# Patient Record
Sex: Male | Born: 1960 | Race: White | Hispanic: No | Marital: Married | Smoking: Current every day smoker
Health system: Southern US, Community
[De-identification: ages and names within clinical notes are randomized; demographics above are authoritative.]

## PROBLEM LIST (undated history)

## (undated) DIAGNOSIS — K859 Acute pancreatitis without necrosis or infection, unspecified: Secondary | ICD-10-CM

## (undated) DIAGNOSIS — I10 Essential (primary) hypertension: Secondary | ICD-10-CM

## (undated) DIAGNOSIS — I714 Abdominal aortic aneurysm, without rupture, unspecified: Secondary | ICD-10-CM

## (undated) DIAGNOSIS — J449 Chronic obstructive pulmonary disease, unspecified: Secondary | ICD-10-CM

## (undated) DIAGNOSIS — Z5111 Encounter for antineoplastic chemotherapy: Secondary | ICD-10-CM

## (undated) DIAGNOSIS — E785 Hyperlipidemia, unspecified: Secondary | ICD-10-CM

## (undated) DIAGNOSIS — I2510 Atherosclerotic heart disease of native coronary artery without angina pectoris: Secondary | ICD-10-CM

## (undated) DIAGNOSIS — M1990 Unspecified osteoarthritis, unspecified site: Secondary | ICD-10-CM

## (undated) DIAGNOSIS — C3490 Malignant neoplasm of unspecified part of unspecified bronchus or lung: Secondary | ICD-10-CM

## (undated) DIAGNOSIS — I219 Acute myocardial infarction, unspecified: Secondary | ICD-10-CM

## (undated) HISTORY — DX: Essential (primary) hypertension: I10

## (undated) HISTORY — DX: Malignant neoplasm of unspecified part of unspecified bronchus or lung: C34.90

## (undated) HISTORY — DX: Hyperlipidemia, unspecified: E78.5

## (undated) HISTORY — DX: Atherosclerotic heart disease of native coronary artery without angina pectoris: I25.10

## (undated) HISTORY — PX: KNEE SURGERY: SHX244

## (undated) HISTORY — DX: Acute myocardial infarction, unspecified: I21.9

## (undated) HISTORY — PX: CORONARY ANGIOPLASTY: SHX604

## (undated) HISTORY — DX: Encounter for antineoplastic chemotherapy: Z51.11

---

## 1997-11-24 ENCOUNTER — Inpatient Hospital Stay (HOSPITAL_COMMUNITY): Admission: EM | Admit: 1997-11-24 | Discharge: 1997-11-26 | Payer: Self-pay | Admitting: Cardiology

## 2000-04-23 DIAGNOSIS — I2510 Atherosclerotic heart disease of native coronary artery without angina pectoris: Secondary | ICD-10-CM

## 2000-04-23 HISTORY — DX: Atherosclerotic heart disease of native coronary artery without angina pectoris: I25.10

## 2000-08-23 ENCOUNTER — Encounter (HOSPITAL_COMMUNITY): Admission: RE | Admit: 2000-08-23 | Discharge: 2000-09-22 | Payer: Self-pay | Admitting: Preventative Medicine

## 2000-12-09 ENCOUNTER — Inpatient Hospital Stay (HOSPITAL_COMMUNITY): Admission: EM | Admit: 2000-12-09 | Discharge: 2000-12-12 | Payer: Self-pay | Admitting: Cardiology

## 2002-05-08 ENCOUNTER — Encounter (HOSPITAL_COMMUNITY): Admission: RE | Admit: 2002-05-08 | Discharge: 2002-06-07 | Payer: Self-pay | Admitting: Cardiology

## 2004-05-01 ENCOUNTER — Emergency Department (HOSPITAL_COMMUNITY): Admission: EM | Admit: 2004-05-01 | Discharge: 2004-05-01 | Payer: Self-pay | Admitting: Emergency Medicine

## 2004-08-27 ENCOUNTER — Emergency Department (HOSPITAL_COMMUNITY): Admission: EM | Admit: 2004-08-27 | Discharge: 2004-08-27 | Payer: Self-pay | Admitting: Emergency Medicine

## 2004-08-29 ENCOUNTER — Emergency Department (HOSPITAL_COMMUNITY): Admission: EM | Admit: 2004-08-29 | Discharge: 2004-08-29 | Payer: Self-pay | Admitting: Emergency Medicine

## 2005-02-26 ENCOUNTER — Ambulatory Visit: Payer: Self-pay | Admitting: Cardiology

## 2006-01-05 ENCOUNTER — Emergency Department (HOSPITAL_COMMUNITY): Admission: EM | Admit: 2006-01-05 | Discharge: 2006-01-05 | Payer: Self-pay | Admitting: Emergency Medicine

## 2006-01-18 ENCOUNTER — Ambulatory Visit: Payer: Self-pay | Admitting: Internal Medicine

## 2006-02-15 ENCOUNTER — Ambulatory Visit (HOSPITAL_COMMUNITY): Admission: RE | Admit: 2006-02-15 | Discharge: 2006-02-15 | Payer: Self-pay | Admitting: Internal Medicine

## 2006-05-15 ENCOUNTER — Ambulatory Visit: Payer: Self-pay | Admitting: Cardiology

## 2006-05-31 ENCOUNTER — Ambulatory Visit: Payer: Self-pay | Admitting: Cardiology

## 2006-06-28 ENCOUNTER — Ambulatory Visit: Payer: Self-pay | Admitting: Cardiology

## 2006-08-14 ENCOUNTER — Ambulatory Visit: Payer: Self-pay | Admitting: Cardiology

## 2006-08-14 ENCOUNTER — Encounter (HOSPITAL_COMMUNITY): Admission: RE | Admit: 2006-08-14 | Discharge: 2006-09-13 | Payer: Self-pay | Admitting: Cardiology

## 2006-08-27 ENCOUNTER — Emergency Department (HOSPITAL_COMMUNITY): Admission: EM | Admit: 2006-08-27 | Discharge: 2006-08-27 | Payer: Self-pay | Admitting: Emergency Medicine

## 2008-09-13 ENCOUNTER — Encounter: Payer: Self-pay | Admitting: Cardiology

## 2008-10-10 ENCOUNTER — Emergency Department (HOSPITAL_COMMUNITY): Admission: EM | Admit: 2008-10-10 | Discharge: 2008-10-10 | Payer: Self-pay | Admitting: Emergency Medicine

## 2008-12-05 ENCOUNTER — Emergency Department (HOSPITAL_COMMUNITY): Admission: EM | Admit: 2008-12-05 | Discharge: 2008-12-05 | Payer: Self-pay | Admitting: Emergency Medicine

## 2008-12-30 ENCOUNTER — Telehealth: Payer: Self-pay | Admitting: Cardiology

## 2008-12-31 ENCOUNTER — Ambulatory Visit: Payer: Self-pay | Admitting: Cardiology

## 2008-12-31 DIAGNOSIS — R079 Chest pain, unspecified: Secondary | ICD-10-CM

## 2009-01-03 ENCOUNTER — Encounter: Payer: Self-pay | Admitting: Cardiology

## 2009-01-03 ENCOUNTER — Telehealth (INDEPENDENT_AMBULATORY_CARE_PROVIDER_SITE_OTHER): Payer: Self-pay | Admitting: *Deleted

## 2009-01-04 ENCOUNTER — Telehealth (INDEPENDENT_AMBULATORY_CARE_PROVIDER_SITE_OTHER): Payer: Self-pay | Admitting: *Deleted

## 2009-01-04 LAB — CONVERTED CEMR LAB
Basophils Absolute: 0 10*3/uL (ref 0.0–0.1)
CO2: 25 meq/L (ref 19–32)
Calcium: 9 mg/dL (ref 8.4–10.5)
Chloride: 104 meq/L (ref 96–112)
Creatinine, Ser: 0.9 mg/dL (ref 0.4–1.5)
Direct LDL: 61.1 mg/dL
Eosinophils Relative: 2.3 % (ref 0.0–5.0)
GFR calc non Af Amer: 95.99 mL/min (ref >60)
Glucose, Bld: 92 mg/dL (ref 70–99)
HCT: 42.8 % (ref 39.0–52.0)
HDL: 26.5 mg/dL — ABNORMAL LOW (ref >39.00)
Hemoglobin: 14.9 g/dL (ref 13.0–17.0)
Lymphocytes Relative: 31.3 % (ref 12.0–46.0)
Lymphs Abs: 2 10*3/uL (ref 0.7–4.0)
Monocytes Relative: 7.1 % (ref 3.0–12.0)
Platelets: 165 10*3/uL (ref 150.0–400.0)
TSH: 4.13 microintl units/mL (ref 0.35–5.50)
Total Bilirubin: 1 mg/dL (ref 0.3–1.2)
Total Protein: 7.2 g/dL (ref 6.0–8.3)
Triglycerides: 1028 mg/dL — ABNORMAL HIGH (ref 0.0–149.0)
VLDL: 205.6 mg/dL — ABNORMAL HIGH (ref 0.0–40.0)
WBC: 6.3 10*3/uL (ref 4.5–10.5)

## 2009-01-10 ENCOUNTER — Encounter (INDEPENDENT_AMBULATORY_CARE_PROVIDER_SITE_OTHER): Payer: Self-pay | Admitting: *Deleted

## 2009-01-10 ENCOUNTER — Ambulatory Visit: Payer: Self-pay | Admitting: Cardiology

## 2009-01-10 DIAGNOSIS — I10 Essential (primary) hypertension: Secondary | ICD-10-CM | POA: Insufficient documentation

## 2009-01-10 DIAGNOSIS — E783 Hyperchylomicronemia: Secondary | ICD-10-CM | POA: Insufficient documentation

## 2009-01-10 DIAGNOSIS — I252 Old myocardial infarction: Secondary | ICD-10-CM

## 2009-01-10 DIAGNOSIS — I2510 Atherosclerotic heart disease of native coronary artery without angina pectoris: Secondary | ICD-10-CM | POA: Insufficient documentation

## 2009-04-27 ENCOUNTER — Emergency Department (HOSPITAL_COMMUNITY): Admission: EM | Admit: 2009-04-27 | Discharge: 2009-04-27 | Payer: Self-pay | Admitting: Emergency Medicine

## 2010-04-14 ENCOUNTER — Encounter: Payer: Self-pay | Admitting: Internal Medicine

## 2010-04-14 ENCOUNTER — Ambulatory Visit: Payer: Self-pay | Admitting: Internal Medicine

## 2010-04-14 ENCOUNTER — Telehealth: Payer: Self-pay | Admitting: Internal Medicine

## 2010-04-14 DIAGNOSIS — R05 Cough: Secondary | ICD-10-CM

## 2010-04-14 DIAGNOSIS — R634 Abnormal weight loss: Secondary | ICD-10-CM

## 2010-04-14 LAB — CONVERTED CEMR LAB
ALT: 16 units/L (ref 0–53)
AST: 16 units/L (ref 0–37)
Albumin: 4.1 g/dL (ref 3.5–5.2)
Alkaline Phosphatase: 64 units/L (ref 39–117)
Basophils Absolute: 0.1 10*3/uL (ref 0.0–0.1)
Basophils Relative: 1.2 % (ref 0.0–3.0)
Bilirubin Urine: NEGATIVE
Calcium: 9.1 mg/dL (ref 8.4–10.5)
Eosinophils Relative: 1.7 % (ref 0.0–5.0)
GFR calc non Af Amer: 90.8 mL/min (ref >60.00)
Glucose, Bld: 77 mg/dL (ref 70–99)
HCT: 43.4 % (ref 39.0–52.0)
HDL: 26.7 mg/dL — ABNORMAL LOW (ref >39.00)
Hemoglobin: 14.9 g/dL (ref 13.0–17.0)
Lymphocytes Relative: 31.9 % (ref 12.0–46.0)
Lymphs Abs: 2.4 10*3/uL (ref 0.7–4.0)
Monocytes Relative: 6.6 % (ref 3.0–12.0)
Neutro Abs: 4.5 10*3/uL (ref 1.4–7.7)
Nitrite: NEGATIVE
PSA: 2.01 ng/mL (ref 0.10–4.00)
Potassium: 4.3 meq/L (ref 3.5–5.1)
RBC: 4.72 M/uL (ref 4.22–5.81)
RDW: 13.8 % (ref 11.5–14.6)
Sodium: 132 meq/L — ABNORMAL LOW (ref 135–145)
TSH: 2.35 microintl units/mL (ref 0.35–5.50)
Total CHOL/HDL Ratio: 8
Total Protein, Urine: NEGATIVE mg/dL
Triglycerides: 634 mg/dL — ABNORMAL HIGH (ref 0.0–149.0)
Urine Glucose: NEGATIVE mg/dL
VLDL: 126.8 mg/dL — ABNORMAL HIGH (ref 0.0–40.0)
WBC: 7.6 10*3/uL (ref 4.5–10.5)
pH: 5.5 (ref 5.0–8.0)

## 2010-04-17 ENCOUNTER — Encounter: Payer: Self-pay | Admitting: Internal Medicine

## 2010-05-08 ENCOUNTER — Ambulatory Visit: Admit: 2010-05-08 | Payer: Self-pay | Admitting: Cardiology

## 2010-05-25 NOTE — Progress Notes (Signed)
Summary: RESULTS  Phone Note Call from Patient   Summary of Call: Pt concerned about labs. Per Dr Posey Rea double simvastatin, stay away from alcohol & fatty foods. F/u w/Dr Yetta Barre in 2 wks. Pt informed  Initial call taken by: Lamar Sprinkles, CMA,  April 14, 2010 4:14 PM  Follow-up for Phone Call        start lipofen Follow-up by: Etta Grandchild MD,  April 17, 2010 6:21 PM  Additional Follow-up for Phone Call Additional follow up Details #1::        Pt informed  Additional Follow-up by: Lamar Sprinkles, CMA,  April 18, 2010 10:17 AM    New/Updated Medications: SIMVASTATIN 10 MG TABS (SIMVASTATIN) 2 daily LIPOFEN 150 MG CAPS (FENOFIBRATE) one by mouth once daily for triglycerides Prescriptions: LIPOFEN 150 MG CAPS (FENOFIBRATE) one by mouth once daily for triglycerides  #90 x 3   Entered by:   Lamar Sprinkles, CMA   Authorized by:   Etta Grandchild MD   Signed by:   Lamar Sprinkles, CMA on 04/18/2010   Method used:   Electronically to        [facility]* (retail)       [address]       [address] Shipping/mailing       [place], [place]  [address]       Ph: [phone]       Fax: [phone]   RxID:   [id number] SIMVASTATIN 10 MG TABS (SIMVASTATIN) 2 daily  #180 x 1   Entered by:   Lamar Sprinkles, CMA   Authorized by:   Etta Grandchild MD   Signed by:   Lamar Sprinkles, CMA on 04/18/2010   Method used:   Electronically to        [facility]* (retail)       [address]       [address] Shipping/mailing       [place], [place]  [address]       Ph: [phone]       Fax: [phone]   RxID:   [id number] LIPOFEN 150 MG CAPS (FENOFIBRATE) one by mouth once daily for triglycerides  #30 x 11   Entered and Authorized by:   Etta Grandchild MD   Signed by:   Etta Grandchild MD on 04/17/2010   Method used:   Historical   RxID:   [id number]

## 2010-05-25 NOTE — Letter (Signed)
Summary: Lipid Letter  [facility]  [address] [place], [place] [address]   Phone: [phone]  Fax: [phone]    04/17/2010  [name] [address] [place], [place]  [address]  Dear [name]:  We have carefully reviewed your last lipid profile from  and the results are noted below with a summary of recommendations for lipid management.    Cholesterol:       206     Goal: <200   HDL "good" Cholesterol:   40.10     Goal: >40   LDL "bad" Cholesterol:   92     Goal: <100   Triglycerides:       634.0     Goal: <150 wow    START LIPOFEN    TLC Diet (Therapeutic Lifestyle Change): Saturated Fats & Transfatty acids should be kept < 7% of total calories ***Reduce Saturated Fats Polyunstaurated Fat can be up to 10% of total calories Monounsaturated Fat Fat can be up to 20% of total calories Total Fat should be no greater than 25-35% of total calories Carbohydrates should be 50-60% of total calories Protein should be approximately 15% of total calories Fiber should be at least 20-30 grams a day ***Increased fiber may help lower LDL Total Cholesterol should be < 200mg /day Consider adding plant stanol/sterols to diet (example: Benacol spread) ***A higher intake of unsaturated fat may reduce Triglycerides and Increase HDL    Adjunctive Measures (may lower LIPIDS and reduce risk of Heart Attack) include: Aerobic Exercise (20-30 minutes 3-4 times a week) Limit Alcohol Consumption Weight Reduction Aspirin 75-81 mg a day by mouth (if not allergic or contraindicated) Dietary Fiber 20-30 grams a day by mouth     Current Medications: 1)    Simvastatin 10 Mg Tabs (Simvastatin) .... Take one tablet by mouth daily at bedtime 2)    Lisinopril 20 Mg Tabs (Lisinopril) .... Take 1 tablet by mouth once a day 3)    Toprol Xl 100 Mg Xr24h-tab (Metoprolol succinate) .... Take 1 tablet by mouth once a day 4)    Aspirin 81 Mg Tbec (Aspirin) .... Take one tablet by mouth daily 5)     Nitrostat 0.4 Mg Subl (Nitroglycerin) 6)    Lipofen 150 Mg Caps (Fenofibrate) .... One by mouth once daily for triglycerides  If you have any questions, please call. We appreciate being able to work with you.   Sincerely,    [name]

## 2010-05-25 NOTE — Letter (Signed)
Summary: Results Follow-up Letter  [facility]  [address] [place], [place] [address]   Phone: [phone]  Fax: [phone]    04/17/2010  [address] [place], [place]  [address]  Dear [name],   The following are the results of your recent test(s):  Test     Result     CBC       normal Liver       normal Kidney     low sodium Thyroid     normal Prostate     normal Urine       normal   _________________________________________________________  Please call for an appointment soon _________________________________________________________ _________________________________________________________ _________________________________________________________  Sincerely,  Sanda Linger MD [facility]

## 2010-05-25 NOTE — Assessment & Plan Note (Signed)
Summary: NEW/ UMR /NWS  #   Vital Signs:  Patient profile:   50 year old male Height:      72 inches Weight:      218 pounds BMI:     29.67 O2 Sat:      99 % on Room air Temp:     98.5 degrees F oral Pulse rate:   77 / minute Pulse rhythm:   regular Resp:     16 per minute BP sitting:   124 / 70  (left arm) Cuff size:   regular  Vitals Entered By: Bill Salinas CMA (April 14, 2010 1:11 PM)  Nutrition Counseling: Patient's BMI is greater than 25 and therefore counseled on weight management options.  O2 Flow:  Room air CC: new pt here to est care with primary/ ab, Preventive Care, Hypertension Management Is Patient Diabetic? No Pain Assessment Patient in pain? no        Primary Care Provider:  Yetta Barre  CC:  new pt here to est care with primary/ ab, Preventive Care, and Hypertension Management.  History of Present Illness: New to me he complains of a 20 pound unexplained weight loss over the last 3 months. He has a chronic smoker's cough but otherwise he does not have any symptoms.  Hypertension History:      He denies headache, chest pain, palpitations, dyspnea with exertion, orthopnea, PND, peripheral edema, visual symptoms, neurologic problems, syncope, and side effects from treatment.  He notes no problems with any antihypertensive medication side effects.        Positive major cardiovascular risk factors include male age 19 years old or older, hyperlipidemia, hypertension, and current tobacco user.  Negative major cardiovascular risk factors include no history of diabetes and negative family history for ischemic heart disease.        Positive history for target organ damage include ASHD (either angina/prior MI/prior CABG).  Further assessment for target organ damage reveals no history of cardiac end-organ damage (CHF/LVH), stroke/TIA, peripheral vascular disease, renal insufficiency, or hypertensive retinopathy.     Preventive Screening-Counseling &  Management  Alcohol-Tobacco     Alcohol drinks/day: 2     Alcohol type: beer     >5/day in last 3 mos: yes     Alcohol Counseling: to decrease amount and/or frequency of alcohol intake     Feels need to cut down: no     Feels annoyed by complaints: no     Feels guilty re: drinking: no     Needs 'eye opener' in am: no     Smoking Status: current     Smoking Cessation Counseling: yes     Packs/Day: 2.0     Year Started: 1979     Pack years: 64     Tobacco Counseling: to quit use of tobacco products  Caffeine-Diet-Exercise     Does Patient Exercise: no  Hep-HIV-STD-Contraception     Hepatitis Risk: no risk noted     HIV Risk: no risk noted     STD Risk: no risk noted  Safety-Violence-Falls     Seat Belt Use: yes     Helmet Use: n/a     Firearms in the Home: no firearms in the home     Smoke Detectors: yes     Violence in the Home: no risk noted     Sexual Abuse: no      Sexual History:  currently monogamous.        Drug Use:  no.  Blood Transfusions:  no.    Current Medications (verified): 1)  Simvastatin 10 Mg Tabs (Simvastatin) .... Take One Tablet By Mouth Daily At Bedtime 2)  Lisinopril 20 Mg Tabs (Lisinopril) .... Take 1 Tablet By Mouth Once A Day 3)  Toprol Xl 100 Mg Xr24h-Tab (Metoprolol Succinate) .... Take 1 Tablet By Mouth Once A Day 4)  Aspirin 81 Mg Tbec (Aspirin) .... Take One Tablet By Mouth Daily 5)  Nitrostat 0.4 Mg Subl (Nitroglycerin)  Allergies (verified): No Known Drug Allergies  Family History: Father: CAD Mother: htn and diabetes Siblings:n/a  Social History: Occupation: Copywriter, advertising for [employer] Married Current Smoker Alcohol use-yes Drug use-no Regular exercise-no Smoking Status:  current Packs/Day:  2.0 Hepatitis Risk:  no risk noted HIV Risk:  no risk noted STD Risk:  no risk noted Sexual History:  currently monogamous Blood Transfusions:  no Seat Belt Use:  yes Drug Use:  no Does Patient Exercise:  no  Review of  Systems       The patient complains of weight loss and prolonged cough.  The patient denies anorexia, fever, weight gain, hoarseness, chest pain, syncope, dyspnea on exertion, peripheral edema, headaches, hemoptysis, abdominal pain, melena, hematochezia, severe indigestion/heartburn, hematuria, incontinence, muscle weakness, suspicious skin lesions, transient blindness, difficulty walking, depression, abnormal bleeding, enlarged lymph nodes, angioedema, and testicular masses.   Resp:  Complains of cough; denies chest discomfort, chest pain with inspiration, coughing up blood, excessive snoring, morning headaches, pleuritic, shortness of breath, sputum productive, and wheezing.  Physical Exam  General:  alert, well-developed, well-nourished, well-hydrated, appropriate dress, normal appearance, healthy-appearing, cooperative to examination, and good hygiene.   Head:  normocephalic, atraumatic, no abnormalities observed, and no abnormalities palpated.   Eyes:  vision grossly intact, pupils equal, and no injection or icterus.   Ears:  R ear normal and L ear normal.   Nose:  External nasal examination shows no deformity or inflammation. Nasal mucosa are pink and moist without lesions or exudates. Mouth:  Oral mucosa and oropharynx without lesions or exudates.  Teeth in good repair. Neck:  supple, full ROM, no masses, no thyromegaly, no JVD, no HJR, normal carotid upstroke, no carotid bruits, no cervical lymphadenopathy, and no neck tenderness.   Breasts:  No masses or gynecomastia noted Lungs:  normal respiratory effort, no intercostal retractions, no accessory muscle use, normal breath sounds, no dullness, no fremitus, no crackles, and no wheezes.   Heart:  normal rate, regular rhythm, no murmur, no gallop, no rub, and no JVD.   Abdomen:  soft, non-tender, normal bowel sounds, no distention, no masses, no guarding, no rigidity, no rebound tenderness, no abdominal hernia, no inguinal hernia, no  hepatomegaly, and no splenomegaly.   Rectal:  No external abnormalities noted. Normal sphincter tone. No rectal masses or tenderness. Genitalia:  circumcised, no hydrocele, no varicocele, no scrotal masses, no testicular masses or atrophy, no cutaneous lesions, and no urethral discharge.   Prostate:  no nodules, no asymmetry, no induration, and 1+ enlarged.   Msk:  normal ROM, no joint tenderness, no joint swelling, no joint warmth, no redness over joints, no joint deformities, no joint instability, no crepitation, and no muscle atrophy.   Pulses:  R and L carotid,radial,femoral,dorsalis pedis and posterior tibial pulses are full and equal bilaterally Extremities:  No clubbing, cyanosis, edema, or deformity noted with normal full range of motion of all joints.   Neurologic:  No cranial nerve deficits noted. Station and gait are normal. Plantar reflexes are down-going bilaterally. DTRs  are symmetrical throughout. Sensory, motor and coordinative functions appear intact. Skin:  turgor normal, no rashes, no suspicious lesions, no ecchymoses, no petechiae, no purpura, no ulcerations, no edema, ruddy, tattoo(s), excessive tan, and solar damage.   Cervical Nodes:  no anterior cervical adenopathy and no posterior cervical adenopathy.   Axillary Nodes:  no R axillary adenopathy and no L axillary adenopathy.   Inguinal Nodes:  no R inguinal adenopathy and no L inguinal adenopathy.   Psych:  Cognition and judgment appear intact. Alert and cooperative with normal attention span and concentration. No apparent delusions, illusions, hallucinations   Impression & Recommendations:  Problem # 1:  WEIGHT LOSS, ABNORMAL (ICD-783.21) Assessment New  Orders: Venipuncture (82956) TLB-Lipid Panel (80061-LIPID) TLB-BMP (Basic Metabolic Panel-BMET) (80048-METABOL) TLB-CBC Platelet - w/Differential (85025-CBCD) TLB-Hepatic/Liver Function Pnl (80076-HEPATIC) TLB-TSH (Thyroid Stimulating Hormone)  (84443-TSH) TLB-Amylase (82150-AMYL) TLB-Lipase (83690-LIPASE) TLB-Udip w/ Micro (81001-URINE) TLB-PSA (Prostate Specific Antigen) (84153-PSA) T-2 View CXR (71020TC) Hemoccult Guaiac-1 spec.(in office) (82270)  Problem # 2:  COUGH (ICD-786.2) Assessment: New will check for mass, edema, pna, etc. Orders: Venipuncture (21308) TLB-Lipid Panel (80061-LIPID) TLB-BMP (Basic Metabolic Panel-BMET) (80048-METABOL) TLB-CBC Platelet - w/Differential (85025-CBCD) TLB-Hepatic/Liver Function Pnl (80076-HEPATIC) TLB-TSH (Thyroid Stimulating Hormone) (84443-TSH) TLB-Amylase (82150-AMYL) TLB-Lipase (83690-LIPASE) TLB-Udip w/ Micro (81001-URINE) TLB-PSA (Prostate Specific Antigen) (84153-PSA) T-2 View CXR (71020TC)  Problem # 3:  HYPERTENSION, BENIGN (ICD-401.1) Assessment: Improved  His updated medication list for this problem includes:    Lisinopril 20 Mg Tabs (Lisinopril) .Marland Kitchen... Take 1 tablet by mouth once a day    Toprol Xl 100 Mg Xr24h-tab (Metoprolol succinate) .Marland Kitchen... Take 1 tablet by mouth once a day  Orders: Venipuncture (65784) TLB-Lipid Panel (80061-LIPID) TLB-BMP (Basic Metabolic Panel-BMET) (80048-METABOL) TLB-CBC Platelet - w/Differential (85025-CBCD) TLB-Hepatic/Liver Function Pnl (80076-HEPATIC) TLB-TSH (Thyroid Stimulating Hormone) (84443-TSH) TLB-Amylase (82150-AMYL) TLB-Lipase (83690-LIPASE) TLB-Udip w/ Micro (81001-URINE) TLB-PSA (Prostate Specific Antigen) (84153-PSA)  BP today: 124/70 Prior BP: 156/99 (01/10/2009)  Labs Reviewed: K+: 4.3 (12/31/2008) Creat: : 0.9 (12/31/2008)   Chol: 257 (12/31/2008)   HDL: 26.50 (12/31/2008)   TG: 1028.0 (12/31/2008)  Problem # 4:  HYPERLIPIDEMIA TYPE I / IV (ICD-272.3) Assessment: Unchanged  His updated medication list for this problem includes:    Simvastatin 10 Mg Tabs (Simvastatin) .Marland Kitchen... Take one tablet by mouth daily at bedtime  Orders: Venipuncture (69629) TLB-Lipid Panel (80061-LIPID) TLB-BMP (Basic Metabolic  Panel-BMET) (80048-METABOL) TLB-CBC Platelet - w/Differential (85025-CBCD) TLB-Hepatic/Liver Function Pnl (80076-HEPATIC) TLB-TSH (Thyroid Stimulating Hormone) (84443-TSH) TLB-Amylase (82150-AMYL) TLB-Lipase (83690-LIPASE) TLB-Udip w/ Micro (81001-URINE) TLB-PSA (Prostate Specific Antigen) (84153-PSA)  Labs Reviewed: SGOT: 23 (12/31/2008)   SGPT: 21 (12/31/2008)  10 Yr Risk Heart Disease: N/A   HDL:26.50 (12/31/2008)  Chol:257 (12/31/2008)  Trig:1028.0 (12/31/2008)  Complete Medication List: 1)  Simvastatin 10 Mg Tabs (Simvastatin) .... Take one tablet by mouth daily at bedtime 2)  Lisinopril 20 Mg Tabs (Lisinopril) .... Take 1 tablet by mouth once a day 3)  Toprol Xl 100 Mg Xr24h-tab (Metoprolol succinate) .... Take 1 tablet by mouth once a day 4)  Aspirin 81 Mg Tbec (Aspirin) .... Take one tablet by mouth daily 5)  Nitrostat 0.4 Mg Subl (Nitroglycerin)  Other Orders: Cardiology Referral (Cardiology) EKG w/ Interpretation (93000)  Hypertension Assessment/Plan:      The patient's hypertensive risk group is category C: Target organ damage and/or diabetes.  Today's blood pressure is 124/70.  His blood pressure goal is < 140/90.  Colorectal Screening:  Current Recommendations:    Hemoccult: NEG X 1 today    Colonoscopy recommended: patient refused  PSA Screening:    Reviewed PSA screening recommendations: PSA ordered  Immunization & Chemoprophylaxis:    Tetanus vaccine: Historical  (01/30/2005)  Patient Instructions: 1)  Please schedule a follow-up appointment in 1 month. 2)  Tobacco is very bad for your health and your loved ones! You Should stop smoking!. 3)  Stop Smoking Tips: Choose a Quit date. Cut down before the Quit date. decide what you will do as a substitute when you feel the urge to smoke(gum,toothpick,exercise). 4)  Check your Blood Pressure regularly. If it is above 130/80: you should make an appointment.   Orders Added: 1)  Venipuncture [36415] 2)   TLB-Lipid Panel [80061-LIPID] 3)  TLB-BMP (Basic Metabolic Panel-BMET) [80048-METABOL] 4)  TLB-CBC Platelet - w/Differential [85025-CBCD] 5)  TLB-Hepatic/Liver Function Pnl [80076-HEPATIC] 6)  TLB-TSH (Thyroid Stimulating Hormone) [84443-TSH] 7)  TLB-Amylase [82150-AMYL] 8)  TLB-Lipase [83690-LIPASE] 9)  TLB-Udip w/ Micro [81001-URINE] 10)  TLB-PSA (Prostate Specific Antigen) [84153-PSA] 11)  T-2 View CXR [71020TC] 12)  Cardiology Referral [Cardiology] 13)  Hemoccult Guaiac-1 spec.(in office) [82270] 14)  EKG w/ Interpretation [93000] 15)  New Patient Level V [99205]   Immunization History:  Tetanus/Td Immunization History:    Tetanus/Td:  historical (01/30/2005)   Immunization History:  Tetanus/Td Immunization History:    Tetanus/Td:  Historical (01/30/2005)    Prevention & Chronic Care Immunizations   Influenza vaccine: Not documented   Influenza vaccine deferral: Refused  (04/14/2010)    Tetanus booster: 01/30/2005: Historical    Pneumococcal vaccine: Not documented  Other Screening   Smoking status: current  (04/14/2010)   Smoking cessation counseling: yes  (04/14/2010)  Lipids   Total Cholesterol: 257  (12/31/2008)   LDL: Not documented   LDL Direct: 61.1  (12/31/2008)   HDL: 26.50  (12/31/2008)   Triglycerides: 1028.0  (12/31/2008)    SGOT (AST): 23  (12/31/2008)   SGPT (ALT): 21  (12/31/2008)   Alkaline phosphatase: 57  (12/31/2008)   Total bilirubin: 1.0  (12/31/2008)  Hypertension   Last Blood Pressure: 124 / 70  (04/14/2010)   Serum creatinine: 0.9  (12/31/2008)   Serum potassium 4.3  (12/31/2008)  Self-Management Support :    Hypertension self-management support: Not documented    Lipid self-management support: Not documented

## 2010-06-16 ENCOUNTER — Telehealth: Payer: Self-pay | Admitting: Internal Medicine

## 2010-06-20 NOTE — Progress Notes (Signed)
  Phone Note Refill Request   Refills Requested: Medication #1:  LIPOFEN 150 MG CAPS one by mouth once daily for triglycerides. Pt needs generic to this medication. Please Advise copay too high for name brand. [facility]  Initial call taken by: Ami Bullins CMA,  June 16, 2010 11:19 AM  Follow-up for Phone Call        pts wife informed Follow-up by: Ami Bullins CMA,  June 16, 2010 11:34 AM    New/Updated Medications: FENOFIBRATE MICRONIZED 134 MG CAPS (FENOFIBRATE MICRONIZED) One by mouth once daily Prescriptions: FENOFIBRATE MICRONIZED 134 MG CAPS (FENOFIBRATE MICRONIZED) One by mouth once daily  #90 x 3   Entered and Authorized by:   Etta Grandchild MD   Signed by:   Etta Grandchild MD on 06/16/2010   Method used:   Electronically to        [facility]* (retail)       [address]       [address] Shipping/mailing       [place], [place]  [address]       Ph: [phone]       Fax: [phone]   RxID:   [id number]

## 2010-06-24 ENCOUNTER — Emergency Department (HOSPITAL_COMMUNITY)
Admission: EM | Admit: 2010-06-24 | Discharge: 2010-06-24 | Disposition: A | Discharge status: Home / Self Care | Payer: 59 | Attending: Emergency Medicine | Admitting: Emergency Medicine

## 2010-06-24 DIAGNOSIS — R209 Unspecified disturbances of skin sensation: Secondary | ICD-10-CM | POA: Insufficient documentation

## 2010-06-24 DIAGNOSIS — Z79899 Other long term (current) drug therapy: Secondary | ICD-10-CM | POA: Insufficient documentation

## 2010-06-24 DIAGNOSIS — I2510 Atherosclerotic heart disease of native coronary artery without angina pectoris: Secondary | ICD-10-CM | POA: Insufficient documentation

## 2010-06-24 DIAGNOSIS — R42 Dizziness and giddiness: Secondary | ICD-10-CM | POA: Insufficient documentation

## 2010-06-24 DIAGNOSIS — I252 Old myocardial infarction: Secondary | ICD-10-CM | POA: Insufficient documentation

## 2010-06-24 DIAGNOSIS — I10 Essential (primary) hypertension: Secondary | ICD-10-CM | POA: Insufficient documentation

## 2010-06-28 ENCOUNTER — Encounter: Payer: Self-pay | Admitting: Internal Medicine

## 2010-06-28 ENCOUNTER — Other Ambulatory Visit: Payer: Self-pay | Admitting: Internal Medicine

## 2010-06-28 ENCOUNTER — Ambulatory Visit (INDEPENDENT_AMBULATORY_CARE_PROVIDER_SITE_OTHER): Payer: 59 | Admitting: Internal Medicine

## 2010-06-28 ENCOUNTER — Other Ambulatory Visit: Payer: 59

## 2010-06-28 DIAGNOSIS — R209 Unspecified disturbances of skin sensation: Secondary | ICD-10-CM

## 2010-06-28 DIAGNOSIS — R634 Abnormal weight loss: Secondary | ICD-10-CM

## 2010-06-28 DIAGNOSIS — I10 Essential (primary) hypertension: Secondary | ICD-10-CM

## 2010-06-28 DIAGNOSIS — E783 Hyperchylomicronemia: Secondary | ICD-10-CM

## 2010-06-28 DIAGNOSIS — G459 Transient cerebral ischemic attack, unspecified: Secondary | ICD-10-CM | POA: Insufficient documentation

## 2010-06-28 DIAGNOSIS — E785 Hyperlipidemia, unspecified: Secondary | ICD-10-CM

## 2010-06-28 LAB — LIPID PANEL
Cholesterol: 245 mg/dL — ABNORMAL HIGH (ref 0–200)
HDL: 29.2 mg/dL — ABNORMAL LOW (ref >39.00)
Total CHOL/HDL Ratio: 8
VLDL: 118.4 mg/dL — ABNORMAL HIGH (ref 0.0–40.0)

## 2010-06-28 LAB — BASIC METABOLIC PANEL
Chloride: 99 mEq/L (ref 96–112)
GFR: 87.49 mL/min (ref >60.00)
Potassium: 4.3 mEq/L (ref 3.5–5.1)
Sodium: 137 mEq/L (ref 135–145)

## 2010-06-28 LAB — CONVERTED CEMR LAB: HDL goal, serum: 40 mg/dL

## 2010-06-29 ENCOUNTER — Telehealth: Payer: Self-pay | Admitting: Internal Medicine

## 2010-06-29 ENCOUNTER — Ambulatory Visit (HOSPITAL_COMMUNITY)
Admission: RE | Admit: 2010-06-29 | Discharge: 2010-06-29 | Disposition: A | Discharge status: Home / Self Care | Payer: 59 | Source: Ambulatory Visit | Attending: Internal Medicine | Admitting: Internal Medicine

## 2010-06-29 DIAGNOSIS — R209 Unspecified disturbances of skin sensation: Secondary | ICD-10-CM | POA: Insufficient documentation

## 2010-06-30 ENCOUNTER — Other Ambulatory Visit: Payer: Self-pay | Admitting: Internal Medicine

## 2010-06-30 DIAGNOSIS — G459 Transient cerebral ischemic attack, unspecified: Secondary | ICD-10-CM

## 2010-07-04 NOTE — Progress Notes (Signed)
  Phone Note Call from Patient   [name] Summary of Call: Patient called stating that due to las lab result, pt was advised to double up on simvastatin 10mg  and to also start anothother cholesterol medication. Because of this he has ran out of his simvastatin early and [facility] pharmacy will not fill. Please advise if pt should continue taking simvastatin at 20mg  and if ok to refill. Thanks Initial call taken by: Rock Nephew CMA,  June 29, 2010 7:53 AM  Follow-up for Phone Call        please let him know that his triglycerides are 592!!!!!!!!!!!!!!!!! Follow-up by: Etta Grandchild MD,  June 29, 2010 8:18 AM  Additional Follow-up for Phone Call Additional follow up Details #1::        Patient wife notified.[name] [name] CMA  June 29, 2010 8:48 AM     New/Updated Medications: SIMVASTATIN 20 MG TABS (SIMVASTATIN) One by mouth once daily Prescriptions: SIMVASTATIN 20 MG TABS (SIMVASTATIN) One by mouth once daily  #90 x 3   Entered and Authorized by:   Etta Grandchild MD   Signed by:   Etta Grandchild MD on 06/29/2010   Method used:   Electronically to        [facility]* (retail)       [address]       [address] Shipping/mailing       [place], [place]  [address]       Ph: [phone]       Fax: [phone]   RxID:   [id number]

## 2010-07-04 NOTE — Letter (Signed)
Summary: Lipid Letter  [facility]  [address] [place], [place] [address]   Phone: [phone]  Fax: [phone]    06/28/2010  [name] [address] [place], [place]  [address]  Dear [name]:  We have carefully reviewed your last lipid profile from  and the results are noted below with a summary of recommendations for lipid management.    Cholesterol:       245     Goal: <200   HDL "good" Cholesterol:   09.32     Goal: >40   LDL "bad" Cholesterol:   125     Goal: <70   Triglycerides:       592.0      Triglyceride is over 400; calculations on Lipids are invalid. mg/dL     Goal: <355        TLC Diet (Therapeutic Lifestyle Change): Saturated Fats & Transfatty acids should be kept < 7% of total calories ***Reduce Saturated Fats Polyunstaurated Fat can be up to 10% of total calories Monounsaturated Fat Fat can be up to 20% of total calories Total Fat should be no greater than 25-35% of total calories Carbohydrates should be 50-60% of total calories Protein should be approximately 15% of total calories Fiber should be at least 20-30 grams a day ***Increased fiber may help lower LDL Total Cholesterol should be < 200mg /day Consider adding plant stanol/sterols to diet (example: Benacol spread) ***A higher intake of unsaturated fat may reduce Triglycerides and Increase HDL    Adjunctive Measures (may lower LIPIDS and reduce risk of Heart Attack) include: Aerobic Exercise (20-30 minutes 3-4 times a week) Limit Alcohol Consumption Weight Reduction Aspirin 75-81 mg a day by mouth (if not allergic or contraindicated) Dietary Fiber 20-30 grams a day by mouth     Current Medications: 1)    Simvastatin 10 Mg Tabs (Simvastatin) .... 2 daily 2)    Lisinopril 20 Mg Tabs (Lisinopril) .... Take 1 tablet by mouth once a day 3)    Toprol Xl 100 Mg Xr24h-tab (Metoprolol succinate) .... Take 1 tablet by mouth once a day 4)    Aspirin 81 Mg Tbec (Aspirin) .... Take one  tablet by mouth daily 5)    Nitrostat 0.4 Mg Subl (Nitroglycerin) 6)    Fenofibrate Micronized 134 Mg Caps (Fenofibrate micronized) .... One by mouth once daily  If you have any questions, please call. We appreciate being able to work with you.   Sincerely,    [facility]

## 2010-07-04 NOTE — Assessment & Plan Note (Signed)
Summary: right side of face warm/fingers and toes tingling/lb   Vital Signs:  Patient profile:   50 year old male Height:      72 inches Weight:      212 pounds BMI:     28.86 O2 Sat:      97 % on Room air Temp:     98.3 degrees F oral Pulse rate:   74 / minute Pulse rhythm:   regular Resp:     16 per minute BP sitting:   110 / 62  (left arm) Cuff size:   regular  Vitals Entered By: Bill Salinas CMA (June 28, 2010 8:43 AM)  Nutrition Counseling: Patient's BMI is greater than 25 and therefore counseled on weight management options.  O2 Flow:  Room air CC: pt c/o right side of face warm fingers and toes numb and tingling/ ab, Hypertension Management, Lipid Management   Primary Care Provider:  Yetta Barre  CC:  pt c/o right side of face warm fingers and toes numb and tingling/ ab, Hypertension Management, and Lipid Management.  History of Present Illness: [name] has had two episodes in the past week (on Thursday and Saturday) of numbness and tingling in his hands and feet accompanied by bloodshot eyes. Each episode occured while eating a sausage biscuit and lasted about 5 minutes. He felt slightly faint and dizzy during the episodes themselves but had no lingering effects. He denies blurred vision, headaches, SOB, NVD, tachycardia, palpitations, or fatigue. He has no symptoms of orthostatic hypotension, but his wife acknowledges that his BP has been lower than his normal baseline (112/68 on Saturday, 110/62 today). He does have decreased energy. His coordination and gait are not affected. He went to the ER on Saturday after the second episode but left after 3 hours because he was frustrated with the care he was receiving.   [name] denies increased thirst or urination, but his wife disagrees. He also shares that for the past several years, he will feel "nervous and shaky" if he doesn't eat enough. This discomfort is remedied by "drinking something sweet" or eating.  He has lost 30  pounds in the past 4 months; he believes this is secondary to his lack of alcohol consumption (he drank a 12-pack every night for years before "quitting cold [place]" in November).   Hypertension History:      He complains of neurologic problems, but denies headache, chest pain, palpitations, dyspnea with exertion, orthopnea, PND, peripheral edema, visual symptoms, syncope, and side effects from treatment.  He notes no problems with any antihypertensive medication side effects.        Positive major cardiovascular risk factors include male age 49 years old or older, hyperlipidemia, hypertension, and current tobacco user.  Negative major cardiovascular risk factors include no history of diabetes and negative family history for ischemic heart disease.        Positive history for target organ damage include ASHD (either angina/prior MI/prior CABG) and prior stroke (or TIA).  Further assessment for target organ damage reveals no history of cardiac end-organ damage (CHF/LVH), peripheral vascular disease, renal insufficiency, or hypertensive retinopathy.    Lipid Management History:      Positive NCEP/ATP III risk factors include male age 49 years old or older, HDL cholesterol less than 40, current tobacco user, hypertension, ASHD (either angina/prior MI/prior CABG), and prior stroke (or TIA).  Negative NCEP/ATP III risk factors include non-diabetic, no family history for ischemic heart disease, no peripheral vascular disease, and no  history of aortic aneurysm.        The patient states that he knows about the "Therapeutic Lifestyle Change" diet.  His compliance with the TLC diet is not at all.  The patient expresses understanding of adjunctive measures for cholesterol lowering.  Adjunctive measures started by the patient include limit alcohol consumpton and weight reduction.  He expresses no side effects from his lipid-lowering medication.  The patient denies any symptoms to suggest myopathy or liver disease.      Preventive Screening-Counseling & Management  Alcohol-Tobacco     Smoking Cessation Counseling: yes  Current Medications (verified): 1)  Simvastatin 10 Mg Tabs (Simvastatin) .... 2 Daily 2)  Lisinopril 20 Mg Tabs (Lisinopril) .... Take 1 Tablet By Mouth Once A Day 3)  Toprol Xl 100 Mg Xr24h-Tab (Metoprolol Succinate) .... Take 1 Tablet By Mouth Once A Day 4)  Aspirin 81 Mg Tbec (Aspirin) .... Take One Tablet By Mouth Daily 5)  Nitrostat 0.4 Mg Subl (Nitroglycerin) 6)  Fenofibrate Micronized 134 Mg Caps (Fenofibrate Micronized) .... 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 Father: CAD Mother: htn and diabetes Siblings:n/a  Social History: Reviewed history from 04/14/2010 and no changes required. Occupation: Copywriter, advertising for [employer] Married Current Smoker Alcohol use-yes Drug use-no Regular exercise-no  Review of Systems  The patient denies anorexia, fever, vision loss, decreased hearing, hoarseness, chest pain, syncope, dyspnea on exertion, peripheral edema, prolonged cough, headaches, hemoptysis, abdominal pain, suspicious skin  lesions, transient blindness, difficulty walking, depression, and abnormal bleeding.   Neuro:  Complains of numbness and tingling; denies brief paralysis, difficulty with concentration, disturbances in coordination, headaches, inability to speak, memory loss, poor balance, seizures, sensation of room spinning, tremors, visual disturbances, and weakness.  Physical Exam  General:  alert, well-developed, well-nourished, well-hydrated, appropriate dress, normal appearance, healthy-appearing, cooperative to examination, and good hygiene.   Head:  normocephalic, atraumatic, no abnormalities observed, and no abnormalities palpated.   Mouth:  Oral mucosa and oropharynx without lesions or exudates.  Teeth in good repair. Neck:  supple, full ROM, no masses, no thyromegaly, no JVD, no HJR, normal carotid upstroke, no carotid bruits, no cervical lymphadenopathy, and no neck tenderness.   Lungs:  normal respiratory effort, no intercostal retractions, no accessory muscle use, normal breath sounds, no dullness, no fremitus, no crackles, and no wheezes.   Heart:  normal rate, regular rhythm, no murmur, no gallop, no rub, and no JVD.   Abdomen:  soft, non-tender, normal bowel sounds, no distention, no masses, no guarding, no rigidity, no rebound tenderness, no abdominal hernia, no inguinal hernia, no hepatomegaly, and no splenomegaly.   Msk:  No deformity or scoliosis noted of thoracic or lumbar spine.   Pulses:  R and L carotid,radial,femoral,dorsalis pedis and posterior tibial pulses are full and equal bilaterally  Extremities:  No clubbing, cyanosis, edema, or deformity noted with normal full range of motion of all joints.   Neurologic:  alert & oriented X3, cranial nerves II-XII intact, strength normal in all extremities, sensation intact to light touch, sensation intact to pinprick, gait normal, DTRs symmetrical and normal, finger-to-nose normal, heel-to-shin normal, toes down bilaterally on Babinski, and Romberg  negative.   Skin:  Intact without suspicious lesions or rashes Cervical Nodes:  No lymphadenopathy noted Psych:  Cognition and judgment appear intact. Alert and cooperative with normal attention span and concentration. No apparent delusions, illusions, hallucinations   Impression & Recommendations:  Problem # 1:  TIA (ICD-435.9) Assessment New  His updated medication list for this problem includes:    Aspirin 81 Mg Tbec (Aspirin) .Marland Kitchen... Take one tablet by mouth daily  Orders: Radiology Referral (Radiology)  Problem # 2:  FACIAL PARESTHESIA, RIGHT (ICD-782.0) Assessment: New  Orders: Venipuncture ([address]) TLB-BMP (Basic Metabolic Panel-BMET) (80048-METABOL) TLB-Lipid Panel (80061-LIPID) TLB-B12 + Folate Pnl (60454_09811-B14/NWG) Radiology Referral (Radiology)  Problem # 3:  HYPERTENSION, BENIGN (ICD-401.1) Assessment: Unchanged  His updated medication list for this problem includes:    Lisinopril 20 Mg Tabs (Lisinopril) .Marland Kitchen... Take 1 tablet by mouth once a day    Toprol Xl 100 Mg Xr24h-tab (Metoprolol succinate) .Marland Kitchen... 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 2 daily    Fenofibrate Micronized 134 Mg Caps (Fenofibrate micronized) ..... One by mouth once daily  Orders: Venipuncture (41324) TLB-BMP (Basic Metabolic Panel-BMET) (80048-METABOL) TLB-Lipid Panel (80061-LIPID) TLB-B12 + Folate Pnl  ([id number]-U44/IHK)  Labs Reviewed: SGOT: 16 (04/14/2010)   SGPT: 16 (04/14/2010)  Lipid Goals: Chol Goal: 200 (06/28/2010)   HDL Goal: 40 (06/28/2010)   LDL Goal: 70 (06/28/2010)   TG Goal: 150 (06/28/2010)  Prior 10 Yr Risk Heart Disease: N/A (04/14/2010)   HDL:26.70 (04/14/2010), 26.50 (12/31/2008)  Chol:206 (04/14/2010), 257 (12/31/2008)  Trig:634.0 (04/14/2010), 1028.0 (12/31/2008)  Complete Medication List: 1)  Simvastatin 10 Mg Tabs (Simvastatin) .... 2 daily 2)  Lisinopril 20 Mg Tabs (Lisinopril) .... Take 1 tablet by mouth once a day 3)  Toprol Xl 100 Mg Xr24h-tab (Metoprolol succinate) .... Take 1 tablet by mouth once a day 4)  Aspirin 81 Mg Tbec (Aspirin) .... Take one tablet by mouth daily 5)  Nitrostat 0.4 Mg Subl (Nitroglycerin) 6)  Fenofibrate Micronized 134 Mg Caps (Fenofibrate micronized) .... One by mouth once daily  Hypertension Assessment/Plan:      The patient's hypertensive risk group is category C: Target organ damage and/or diabetes.  Today's blood pressure is 110/62.  His blood pressure goal is < 140/90.  Lipid Assessment/Plan:      Based on NCEP/ATP III, the patient's risk factor category is "history of coronary disease, peripheral vascular disease, cerebrovascular disease, or aortic aneurysm along with either diabetes, current smoker, or LDL > 130 plus HDL < 40 plus triglycerides > 200".  The patient's lipid goals are as follows: Total cholesterol goal is 200; LDL cholesterol goal is  70; HDL cholesterol goal is 40; Triglyceride goal is 150.     Patient Instructions: 1)  Please schedule a follow-up appointment in 1 month. 2)  Tobacco is very bad for your health and your loved ones! You Should stop smoking!. 3)  Stop Smoking Tips: Choose a Quit date. Cut down before the Quit date. decide what you will do as a substitute when you feel the urge to smoke(gum,toothpick,exercise). 4)  It is important that you exercise regularly at least 20 minutes 5 times a week.  If you develop chest pain, have severe difficulty breathing, or feel very tired , stop exercising immediately and seek medical attention. 5)  You need to lose weight. Consider a lower calorie diet and regular exercise.  6)  Check your Blood Pressure regularly. If it is above 130/80: you should make an appointment. 7)  Take an Aspirin every day.   Orders Added: 1)  Venipuncture [36415] 2)  TLB-BMP (Basic Metabolic Panel-BMET) [80048-METABOL] 3)  TLB-Lipid Panel [80061-LIPID] 4)  TLB-B12 + Folate Pnl [[id number]-B12/FOL] 5)  Radiology Referral [Radiology] 6)  Est. Patient Level IV [[address]]

## 2010-07-05 ENCOUNTER — Other Ambulatory Visit (HOSPITAL_COMMUNITY): Payer: 59

## 2010-09-05 NOTE — Assessment & Plan Note (Signed)
[facility]                       [place] CARDIOLOGY OFFICE NOTE   NAME:[name], [name]                       MRN:          [id number]  DATE:01/10/2009                            DOB:          [birth year]    [name] comes in today after being absent from the practice for quite  sometime.  He has been a patient of Dr. Sadorus Bing,  but was a  patient of mine back as late as 2004 when he suffered a myocardial  infarction had intervention.   He is wishing to switch from Dr. Dietrich Pates to me today.  It is very  obvious from talking to his wife and looking at his numbers including  laboratory data and his blood pressure and heart rate today that he is  noncompliant.  This has been a long-term issue.   He has multiple complaints mostly just aching all over.  His medications  indicate that he is probably on 2 different statins being Advicor and  simvastatin.  In addition is a question, he is on 2 different ACEs if he  is taking them.  He is also not taking his Toprol at a 100 mg a day  because his heart rate is 110 today.  He did not list aspirin as a  medication.   His last assessment of his cardiac status was with a catheterization in  2002.  At that time, he had nonobstructive disease except a high-grade  lesion in LAD which was angioplastied and stented.  Left ventricular  function at that time showed a EF of 45% with inferior basal hypokinesia  as well as apical akinesia.  His last stress Myoview was in April 2008  at which time he had an EF of 59%.  No ischemia or infarction.  He had  limited exercise tolerance.   His current medications are really not clear as mentioned above.   He lists no drug allergies.   Other problems listed in previous notes.   PHYSICAL EXAMINATION:  VITAL SIGNS:  His blood pressure is 156/99, his  pulse is 102 and regular, his height is 72 inches, his weight is 235  pounds, his BMI is 31.9.  GENERAL:  He is  disheveled.  He is alert and oriented x3.  HEENT:  Unremarkable except dentition in poor shape.  NECK:  Supple.  Carotid upstrokes were equal bilaterally without bruits.  Thyroid is not enlarged.  Trachea is midline.  LUNGS:  Clear to auscultation and percussion.  HEART:  Nondisplaced PMI, normal S1 and S2.  No gallop.  ABDOMEN:  Soft, good bowel sounds.  No midline bruit.  No hepatomegaly.  EXTREMITIES:  No cyanosis, clubbing, but he does have 1+ pitting edema.  Pulses are intact.   Electrocardiogram today shows sinus tachycardia with an incomplete right-  bundle which is unchanged.   ASSESSMENT AND PLAN:  I have had a long talk with [name] and his  wife.  I have made it perfectly clear to him that I am happy to assume  his care if he is willing to meet [place] half way by taking his  medications  and being compliant with his diet.  It is obvious, he is not doing  either one based on laboratory data in the recent past which shows  triglycerides over 1000.   After our discussion, he has brought his medications back and reviewed  with our nurse.  We have clarified that we will have him on one ACE  inhibitor, increase his Advicor, continue aspirin, and resume Toprol 100  mg per day.  I will arrange to have blood work in 6 weeks, fasting  lipids, and comprehensive metabolic panel.  I will then see him back  afterwards for further assessment.  At this point in time, I do not  think he needs objective assessment of his coronary disease, only  therapeutic lifestyle choices that are healthy and compliant.  He asked  me if he would disable from his heart, and I told him that he was not.     Thomas C. Daleen Squibb, MD, [facility]  Electronically Signed    TCW/MedQ  DD: 01/10/2009  DT: 01/11/2009  Job #: [id number]

## 2010-09-08 NOTE — Letter (Signed)
May 15, 2006    Tesfaye D. Felecia Shelling, MD  [address]  [place], [place] [address]   RE:  [name], [name]  MRN:  [id number]  /  DOB:  Jul 03, 1960   Dear Ninetta Lights,   Mr. Cazier returns to the office after a long absence.  We scheduled a  number of followup appointments, but he repeatedly had to work out of  town.  He has done well from a cardiac standpoint with no significant  chest discomfort.  He did have an episode of pancreatitis approximately  four months ago, treated in the emergency department and your office.  Of concern is that his triglycerides have been quite high in the past,  which may have contributed to that event.  He also uses fair amounts of  alcohol, which he has decreased.  Unfortunately, he continues to smoke  one to two packs of cigarettes per day.  He once tried to quit with the  help of [place].   He now comes in because of joint and muscle pain.  He has had trouble  gripping with his hands.  He has knee pain.  He has discomfort in the  anterior surface of both thighs.  There is no history of peripheral  vascular disease nor claudication.  He reports difficulty getting  moving, if he sits for prolonged periods of time.  After he walks for a  few minutes, he feels better.  He has had previous knee surgery.   CURRENT MEDICATIONS INCLUDE:  1. Ramipril 5 mg daily.  2. Advicor 20/500 mg daily.  3. Toprol 50 mg daily.  4. Aspirin 81 mg daily.   EXAM:  A pleasant gentleman, in no acute distress.  The weight is 233, a  few pounds more than some years ago.  Blood pressure 120/80, heart rate  84 and regular, respirations 16.  NECK:  No jugular venous distention; normal carotid upstrokes without  bruits.  CARDIAC:  Normal first and second heart sounds; fourth heart sound  present.  LUNGS:  Clear.  ABDOMEN:  Soft and nontender.  No organomegaly.  EXTREMITIES:  No edema; normal distal pulses, except for a 1+ right  dorsalis pedis.  This is biphasic by  Doppler examination.  NEUROLOGIC:  Symmetric strength and tone; areflexic in the lower  extremities.  No sensory defect.   IMPRESSION:  Mr. Burley is doing well from a cardiac standpoint despite  very inadequate control of cardiovascular risk factors.  His myalgias  and arthralgias may reflect an adverse effect of statin therapy.  Advicor will be discontinued for now.  We will obtain basic lab work,  including a CPK level.  I will see him again in two weeks.  If his  symptoms are better, we will attempt to find alternate lipid-lowering  therapy for him and to encourage cessation of tobacco use.    Sincerely,      Gerrit Friends. Dietrich Pates, MD, [facility]  Electronically Signed    RMR/MedQ  DD: 05/15/2006  DT: 05/15/2006  Job #: [id number]

## 2010-09-08 NOTE — Letter (Signed)
June 28, 2006    [name] D. Felecia Shelling, MD  [address]  [place], [place] [address]   RE:  [name], [name]  MRN:  [id number]  /  DOB:  November 11, 1960   Dear [name]:   [name] returns to the office for continued assessment and treatment  of coronary disease and cardiovascular risk factors.  Since the last  visit, he has continued to feel well.  In fact, he has markedly  moderated alcohol intake and feels a good deal better.  Unfortunately,  he continues to smoke cigarettes.  He has had trouble being compliant  with Chantix, but has taken Ramipril, metoprolol, aspirin and  simvastatin as ordered.  He has had no apparent adverse effects related  to statins.   On exam, a pleasant young gentleman in no acute distress.  The weight is  231 - stable, blood pressure 115/75, heart rate 78 and regular,  respirations 18.  NECK:  No jugular venous distention; no carotid bruits.  LUNGS:  Clear.  CARDIAC:  Normal first and second heart sounds; fourth heart sound  present.  ABDOMEN:  Soft and nontender; no organomegaly.   Lipid profile and chemistry profile are pending.   IMPRESSION:  Mr. [name] is doing better with compliance.  I have asked  him to increase his efforts regarding cigarette smoking and Chantix.  His lipid-lowering therapy will be adjusted based upon test results.  I  will plan to see this nice gentleman again in 3 months.    Sincerely,      [name]. [name] Pates, MD, [facility]  Electronically Signed    RMR/MedQ  DD: 06/28/2006  DT: 06/28/2006  Job #: [id number]

## 2010-09-08 NOTE — Discharge Summary (Signed)
[place]  Patient:    [name], [name] Visit Number: [id number] MRN: [id number]          Service Type: MED Location: [phone] Attending Physician:  Talitha Givens Dictated by:   Abelino Derrick, P.A.C. [facility] Admit Date:  12/09/2000 Discharge Date: 12/12/2000   CC:         Artis Delay, M.D., [place], [place]   Referring Physician Discharge [place]  DISCHARGE DIAGNOSES: 1. Coronary disease, anterior myocardial infarction this treated with left    anterior descending intervention for end-stent restenosis. 2. Left anterior descending stenting in August of 1999. 3. Noncompliance. 4. History of smoking. 5. History of hypertension. 6. Untreated hyperlipidemia.  HISTORY OF PRESENT ILLNESS:  The patient is a 50 year old male who was sent urgently from [facility].  He has known coronary artery disease with previous placement of three stents to his LAD in August of 1999.  He had recurrent chest pain on December 07, 2000, and again on December 08, 2000, and then was admitted to [facility].  His chest pain was relieved with nitroglycerin, heparin, and Integrilin.  He is now transferred.  HOSPITAL COURSE:  He did have some EKG changes in the anterior leads with hyperacute T waves initially followed by T-wave inversion anteriorly.  His enzymes were positive.  His initial enzyme was 204 with 19 MBs and a troponin of 0.98.  He was taken to the catheterization laboratory on a semi-urgent basis and catheterized by Bruce R. Juanda Chance, M.D.  This revealed a 70% small RCA and a 70% small RV branch.  The proximal LAD stent site was patent.  The mid LAD stent site had a 95% occlusion.  It was dilated.  The circumflex OM and a 70% ramus lesion and 70% PL lesion.  The EF was 45%.  The patient underwent urgent PTCA to end-stent restenosis to the LAD.  He tolerated this well.  His right groin was without hematoma.  His ACE inhibitor was adjusted up and  we stopped his nitrates the next day.  He was discharged on December 12, 2000, and will follow up with Maisie Fus C. Daleen Squibb, M.D., in [name], [place].  DISCHARGE MEDICATIONS: 1. Toprol XL 100 mg a day. 2. Aspirin q.d. 3. Plavix 75 mg a day for four weeks. 4. Nitroglycerin sublingual p.r.n. 5. Altace 5 mg a day.  LABORATORY DATA:  The EKG showed sinus rhythm with anterior T-wave inversion. The chest x-ray showed no acute pulmonary findings.  Hematology showed a white count of 6, hemoglobin 12.9, hematocrit 36, and platelets 165.  Sodium 137, potassium 4.0, BUN 7, creatinine 1.0.  Liver functions were normal.  CKs peaked at 1537 with 200 MBs.  The lipid profile showed cholesterol 245, HDL 26, and LDL not calculated.  DISPOSITION:  The patient was discharged in stable condition.  FOLLOW-UP:  Will follow up with Maisie Fus C. Wall, M.D., in [place], [place], in about three weeks. Dictated by:   Abelino Derrick, P.A.C. [facility] Attending Physician:  Talitha Givens DD:  12/31/00 TD:  12/31/00 Job: [id number]

## 2010-09-08 NOTE — Cardiovascular Report (Signed)
[place]. [facility]  Patient:    [name], [name]                         MRN: [id number] Proc. Date: 12/09/00 Adm. Date:  [id number] Attending:  Talitha Givens CC:         Artis Delay, M.D., Angelita Ingles C. Daleen Squibb, M.D. [facility]  Luis Abed, M.D. [facility]  Cardiopulmonary Laboratory   Cardiac Catheterization  PROCEDURES PERFORMED:  Cardiac catheterization.  CLINICAL HISTORY:  The patient is 50 years old and had stenting of the proximal and mid left anterior descending artery in 1999 by Dr. Riley Kill.  He has continued to smoke.  He had intermittent chest pain over the last few days and had severe pain, which began at 1 a.m. this morning and he presented to [facility] Emergency Room at 1:42 a.m. with hyperacute T waves on his electrocardiogram and Q waves across his precordial leads with borderline 1 mm ST elevation.  He was given heparin, Integrilin and aspirin and transferred here for intervention.  DESCRIPTION OF PROCEDURE:  The procedure was performed via the right femoral artery using an arterial sheath and 6 French preformed coronary catheters.  A front wall arterial puncture was performed and Omnipaque contrast was used. After completion of the diagnostic study, we made a decision to proceed with intervention of the LAD.  The patient was given weight-adjusted heparin to prolong the ACT to greater than 200 seconds and was already on an Integrilin drip.  We used a JL4, 7 [place] guiding catheter and a short luge wire.  We were able to cross the lesion within the stent in the mid left anterior descending artery with the wire without difficulty.  We dilated with a 3.0 x 20 mm Maverick performing one inflation of 5 atmospheres for 44 seconds.  We then attempted to pass a 3.25 x 10 mm Cutting Balloon, but were unable to advance the balloon past tortuosity in the proximal vessel.  For this reason we went back in with a 3.0 x 20 mm Maverick and performed  two more inflations of 9 and 8 atmospheres for 47 and 37 seconds.  Repeat diagnostic studies were then performed through the guiding catheter.  The patient tolerated the procedure well and left the lab in satisfactory condition.  RESULTS:  Left main coronary artery:  The left main coronary artery was free of significant disease.  Left anterior descending:  The left anterior descending artery gave rise to two diagonal branches and a large septal perforator.  There was 0% stenosis at the stent near the ostium of the LAD.  There was 50% ostial stenosis of the first diagonal branch.  There was 40% narrowing in the proximal LAD, proximal to and leading into the tandem overlying stents in the mid LAD.  There was a 95% focal stenosis in the very distal portion of the second of the two overlapping stents with TIMI-2 flow.  The two stents represented a 3.5 x 14 mm Radius and a 3.0 x 13 mm Penta.  Circumflex artery:  The circumflex artery was a dominant vessel that gave rise to an intermediate branch, three marginal branches, three posterolateral branches, and a posterior descending branch.  There was 70% ostial stenosis in the intermediate branch.  There was 70% ostial narrowing in the second of the three posterolateral branches.  Right coronary artery:  The right coronary is a nondominant vessel that gave rise to two right ventricular  branches.  There was 70% stenosis in each of these branches.  LEFT VENTRICULOGRAPHY:  The left ventriculogram performed in the RAO projection showed hypokinesis of the inferobasal wall and akinesis of the apex.  The estimated ejection fraction was 45%.  Following PTCA of the lesion in the distal portion of the second of two tandem overlying stents in the mid LAD, the stenosis improved from 95% to less than 10% and the flow improved from TIMI-2 to TIMI-3 flow.  The patient had the onset of severe chest pain and 1 a.m. and arrived at [facility]  Emergency Room at 1:42 a.m.  The first balloon inflation was at 5:30 a.m.  This gave a door to balloon time from [facility] of 3 hours and 52 minutes and reperfusion time of 4 hours and 30 minutes.  CONCLUSIONS: 1. Coronary artery disease, status post prior stenting of the proximal    and mid left anterior descending artery in 1999 with an acute    anterior wall myocardial infarction due to 95% stenosis in the distal    portion of the stent in the mid vessel, 40% narrowing in the proximal    left anterior descending and 50% narrowing in the diagonal branch, 70%    narrowing in the intermediate and 70% narrowing in the second    posterolateral branch of a dominant circumflex artery, 70% stenosis in a    nondominant right coronary artery and anterolateral wall and apical wall    akinesis. 2. Successful reperfusion and percutaneous transluminal coronary angioplasty    of the restenosis within the stent in the mid left anterior descending    artery with improvement in percent diameter narrowing from 95% to less    than 10% and improvement in flow from TIMI-2 to TIMI-3 flow.  DISPOSITION:  The patient was returned to the postangioplasty unit for further observation. DD:  12/09/00 TD:  12/09/00 Job: [id number]

## 2010-09-08 NOTE — Letter (Signed)
May 31, 2006    Tesfaye D. Felecia Shelling, MD  [address]  [place], [place] [address]   RE:  [name], [name]  MRN:  [id number]  /  DOB:  10-17-1960   Dear Ninetta Lights:   [name] returns to the office for continued assessment and treatment  of coronary disease and especially cardiovascular risk factors.  Since  his last visit, he has moderated cigarette consumption and virtually  discontinued alcohol use.  His myalgias and arthralgias improved  dramatically when Advicor was discontinued.   MEDICATIONS:  Unchanged from his last visit.   EXAMINATION:  Pleasant gentleman in no acute distress.  The weight is  231, 2 pounds decreased.  Blood pressure 110/75, heart rate 80 and  regular, respirations 16.  NECK:  No jugular venous distention.  LUNGS:  Clear.  CARDIAC:  Modest systolic ejection murmur.  ABDOMEN:  Soft and nontender.  EXTREMITIES:  No edema.   IMPRESSION:  [name] is doing well with risk factor modification.  We  will provide him with a prescription for Chantix and ask him to attempt  to discontinue smoking entirely.  He will continue to moderate alcohol  use.  We will try Simvastatin at low dose to determine if he can  tolerate this.  If not, alternatives to statins will need to be  explored.  A lipid profile, chemistry profile, and repeat office visit  are anticipated in one month.    Sincerely,      Gerrit Friends. Dietrich Pates, MD, [facility]  Electronically Signed    RMR/MedQ  DD: 05/31/2006  DT: 05/31/2006  Job #: [id number]

## 2010-09-08 NOTE — Discharge Summary (Signed)
[place]. [facility]  Patient:    [name], [name] Visit Number: [id number] MRN: [id number]          Service Type: MED Location: [phone] Attending Physician:  Talitha Givens Dictated by:   Abelino Derrick, P.A.C. [facility] Admit Date:  12/09/2000 Discharge Date: 12/12/2000   CC:         Artis Delay, M.D., [place], [place]                  Referring Physician Discharge [place]  INCOMPLETE  DISCHARGE DIAGNOSES: 1. Coronary disease, anterior myocardial infarction this admission, treated    with left anterior descending intervention for in-stent restenosis. 2. History of left anterior descending stenting, August of 1999. 3. Smoking. 4. Hypertension. 5. Hyperlipidemia, currently untreated.  HOSPITAL COURSE:  Patient is a 50 year old male followed by Dr. Sherwood Gambler and is seen by Dr. Jesse Sans. Wall in [place] with a history of coronary disease. He had a previous intervention with three stents place to the LAD in August of 1999, he is 64 Dictated by:   Abelino Derrick, P.A.C. [facility] Attending Physician:  Talitha Givens DD:  12/31/00 TD:  12/31/00 Job: [id number]

## 2010-11-10 ENCOUNTER — Encounter: Payer: Self-pay | Admitting: *Deleted

## 2010-11-10 ENCOUNTER — Ambulatory Visit (INDEPENDENT_AMBULATORY_CARE_PROVIDER_SITE_OTHER): Payer: BC Managed Care – PPO | Admitting: Adult Health

## 2010-11-10 ENCOUNTER — Encounter: Payer: Self-pay | Admitting: Adult Health

## 2010-11-10 DIAGNOSIS — E782 Mixed hyperlipidemia: Secondary | ICD-10-CM

## 2010-11-10 DIAGNOSIS — I25810 Atherosclerosis of coronary artery bypass graft(s) without angina pectoris: Secondary | ICD-10-CM

## 2010-11-10 DIAGNOSIS — E7800 Pure hypercholesterolemia, unspecified: Secondary | ICD-10-CM

## 2010-11-10 DIAGNOSIS — I10 Essential (primary) hypertension: Secondary | ICD-10-CM

## 2010-11-10 DIAGNOSIS — I2510 Atherosclerotic heart disease of native coronary artery without angina pectoris: Secondary | ICD-10-CM

## 2010-11-10 MED ORDER — NITROGLYCERIN 0.4 MG SL SUBL: 0.4000 mg | SUBLINGUAL_TABLET | SUBLINGUAL | Status: DC | PRN

## 2010-11-10 NOTE — Progress Notes (Signed)
HPI: [name] is a 50 y/o patient of Dr. Juanito Doom we are seeing after 2 yr absence with known history of CAD s/p angioplasty of the LAD in 2002, ongoing tobacco use, hypercholesterolemia, hypertension who presents today for complaints of fatigue, increased DOE, two episodes of syncope when working in 100 degree temps, and history of medical noncompliance. He has also run out of NTG and wishes to have a refill.  He denies chest pain.  No Known Allergies  Current Outpatient Prescriptions  Medication Sig Dispense Refill  . fenofibrate micronized (LOFIBRA) 134 MG capsule Take 134 mg by mouth daily before breakfast.       . lisinopril (PRINIVIL,ZESTRIL) 20 MG tablet Take 20 mg by mouth daily.       . metoprolol (TOPROL-XL) 100 MG 24 hr tablet Take 100 mg by mouth daily.       . simvastatin (ZOCOR) 20 MG tablet Take 20 mg by mouth at bedtime.       . nitroGLYCERIN (NITROSTAT) 0.4 MG SL tablet Place 1 tablet (0.4 mg total) under the tongue every 5 (five) minutes as needed for chest pain.  25 tablet  3    Past Medical History  Diagnosis Date  . Coronary artery disease 2002    Lesion in LAD s/p angioplasty  . Hypertension   . Hyperlipidemia     History reviewed. No pertinent past surgical history.  WUJ:WJXBJY of systems complete and found to be negative unless listed above PHYSICAL EXAM BP 116/71  Pulse 76  Ht 5\' 10"  (1.778 m)  Wt 209 lb (94.802 kg)  BMI 29.99 kg/m2  SpO2 96% General: Well developed, well nourished, in no acute distress Head: Eyes PERRLA, No xanthomas.   Normal cephalic and atramatic  Lungs: Clear bilaterally to auscultation and percussion. Heart: HRRR S1 S2, .  Pulses are 2+ & equal.            No carotid bruit. No JVD.  No abdominal bruits. No femoral bruits. Abdomen: Bowel sounds are positive, abdomen soft and non-tender without masses or                  Hernia's noted. Msk:  Back normal, normal gait. Normal strength and tone for age. Extremities: No clubbing,  cyanosis or edema.  DP +1 Neuro: Alert and oriented X 3. Psych:  Good affect, responds appropriately  EKG: Incomplete RBBB. Rate of 78 bpm:  ASSESSMENT AND PLAN

## 2010-11-10 NOTE — Patient Instructions (Signed)
Your physician has requested that you have en exercise stress myoview. For further information please visit [email or web address] Please follow instruction sheet, as given.  Your physician has recommended you make the following change in your medication: start taking Nitroglycerin 0.4 mg as needed for chest pain  Your physician recommends that you return for lab work in: next week  Your physician recommends that you schedule a follow-up appointment in: after test

## 2010-11-10 NOTE — Assessment & Plan Note (Signed)
He is a difficult patient as we do not see him often unless he is symptomatic or needs medications. He is having symptoms of fatigue and shortness of breath.  I am uncertain is syncope is cardiac related. This occurred while working outside in 100 degree heat. He denies chest pain.  He unfortunately continues to smoke.   I have counseled him on smoking cessation and need to do follow up regularly.  I will schedule him for stress myoview.  He is willing to proceed with test. He is given Rx for NTG SL.  Follow-up with Dr. Daleen Squibb

## 2010-11-22 ENCOUNTER — Encounter: Payer: Self-pay | Admitting: Physician Assistant

## 2010-11-22 ENCOUNTER — Ambulatory Visit (INDEPENDENT_AMBULATORY_CARE_PROVIDER_SITE_OTHER): Payer: BC Managed Care – PPO | Admitting: Physician Assistant

## 2010-11-22 DIAGNOSIS — F17200 Nicotine dependence, unspecified, uncomplicated: Secondary | ICD-10-CM

## 2010-11-22 DIAGNOSIS — R0989 Other specified symptoms and signs involving the circulatory and respiratory systems: Secondary | ICD-10-CM

## 2010-11-22 DIAGNOSIS — Z720 Tobacco use: Secondary | ICD-10-CM | POA: Insufficient documentation

## 2010-11-22 DIAGNOSIS — R079 Chest pain, unspecified: Secondary | ICD-10-CM

## 2010-11-22 DIAGNOSIS — I2510 Atherosclerotic heart disease of native coronary artery without angina pectoris: Secondary | ICD-10-CM

## 2010-11-22 DIAGNOSIS — R0602 Shortness of breath: Secondary | ICD-10-CM

## 2010-11-22 NOTE — Assessment & Plan Note (Signed)
History of MI and angioplasty of the LAD. Await stress Myoview results.

## 2010-11-22 NOTE — Progress Notes (Signed)
HPI: This is a 50 year old white male patient who was recently seen by Metta Clines for evaluation of dyspnea on exertion, heat exhaustion in 2 episodes of syncope while working in 100 temperatures. He is scheduled for a stress Myoview tomorrow, as well as labs.  The patient works Probation officer in [place] and became overheated and short of breath yesterday while working in the heat. He says he generally does not feel well while working in the heat and had to come home. He continues to smoke 2 packs of cigarettes daily. He claims he is taking his medications. He also says that he drinks water all day long and tries to stay hydrated. He denies any associated chest pain.  No Known Allergies  Current Outpatient Prescriptions on File Prior to Visit  Medication Sig Dispense Refill  . fenofibrate micronized (LOFIBRA) 134 MG capsule Take 134 mg by mouth daily before breakfast.       . lisinopril (PRINIVIL,ZESTRIL) 20 MG tablet Take 20 mg by mouth daily.       . metoprolol (TOPROL-XL) 100 MG 24 hr tablet Take 100 mg by mouth daily.       . nitroGLYCERIN (NITROSTAT) 0.4 MG SL tablet Place 1 tablet (0.4 mg total) under the tongue every 5 (five) minutes as needed for chest pain.  25 tablet  3  . simvastatin (ZOCOR) 20 MG tablet Take 20 mg by mouth at bedtime.         Past Medical History  Diagnosis Date  . Coronary artery disease 2002    Lesion in LAD s/p angioplasty  . Hypertension   . Hyperlipidemia     No past surgical history on file.  No family history on file.  History   Social History  . Marital Status: Married    Spouse Name: N/A    Number of Children: N/A  . Years of Education: N/A   Occupational History  . Not on file.   Social History Main Topics  . Smoking status: Current Everyday Smoker -- 2.0 packs/day  . Smokeless tobacco: Never Used  . Alcohol Use: No  . Drug Use: No  . Sexually Active: Not on file   Other Topics Concern  . Not on file   Social  History Narrative  . No narrative on file    ROS: See HPI Eyes: Negative Ears:Negative for hearing loss, tinnitus Cardiovascular: Negative for chest pain, palpitations,irregular heartbeat, orthopnea, paroxysmal nocturnal dyspnia and edema, claudication, cyanosis,.  Respiratory:   Negative for cough, hemoptysis, sleep disturbances due to breathing, sputum production and wheezing.   Endocrine: Negative for cold intolerance and heat intolerance.  Hematologic/Lymphatic: Negative for adenopathy and bleeding problem. Does not bruise/bleed easily.  Musculoskeletal: Negative.   Gastrointestinal: Negative for nausea, vomiting, reflux, abdominal pain, diarrhea, constipation.   Genitourinary: Negative for bladder incontinence, dysuria, flank pain, frequency, hematuria, hesitancy, nocturia and urgency.  Neurological: Negative.  Allergic/Immunologic: Negative for environmental allergies.   PHYSICAL EXAM Well-nournished, in no acute distress. Neck: No JVD, HJR, Bruit, or thyroid enlargement Lungs: No tachypnea, clear without wheezing, rales, or rhonchi Cardiovascular: RRR, PMI not displaced, heart sounds normal, no murmurs, gallops, bruit, thrill, or heave. Abdomen: BS normal. Soft without organomegaly, masses, lesions or tenderness. Extremities: without cyanosis, clubbing or edema. Good distal pulses bilateral SKin: Warm, no lesions or rashes  Musculoskeletal: No deformities Neuro: no focal signs  BP 130/82  Pulse 83  Ht 5\' 10"  (1.778 m)  Wt 208 lb (94.348 kg)  BMI 29.84 kg/m2  SpO2 97%  NFA:OZHYQM sinus rhythm no acute change  ASSESSMENT AND PLAN:

## 2010-11-22 NOTE — Assessment & Plan Note (Signed)
Smoking cessation was addressed

## 2010-11-22 NOTE — Assessment & Plan Note (Signed)
Patient complains of dyspnea on exertion while working in extreme heat. He is scheduled for a stress Myoview tomorrow, as well as labs. EKG today is normal. Await stress Myoview results.

## 2010-11-22 NOTE — Patient Instructions (Addendum)
**Note De-Identified Lisandra Mathisen Obfuscation** Your physician recommends that you continue on your current medications as directed. Please refer to the Current Medication list given to you today.  Your physician discussed the hazards of tobacco use. Tobacco use cessation is recommended and techniques and options to help you quit were discussed.  Your physician recommends that you schedule a follow-up appointment in: As previously scheduled

## 2010-11-23 ENCOUNTER — Telehealth: Payer: Self-pay | Admitting: Nurse Practitioner

## 2010-11-23 ENCOUNTER — Ambulatory Visit (HOSPITAL_COMMUNITY)
Admission: RE | Admit: 2010-11-23 | Discharge: 2010-11-23 | Disposition: A | Discharge status: Home / Self Care | Payer: BC Managed Care – PPO | Source: Ambulatory Visit | Attending: Adult Health | Admitting: Adult Health

## 2010-11-23 ENCOUNTER — Encounter (HOSPITAL_COMMUNITY)
Admission: RE | Admit: 2010-11-23 | Discharge: 2010-11-23 | Disposition: A | Discharge status: Home / Self Care | Payer: BC Managed Care – PPO | Source: Ambulatory Visit | Attending: Adult Health | Admitting: Adult Health

## 2010-11-23 ENCOUNTER — Other Ambulatory Visit: Payer: Self-pay | Admitting: Adult Health

## 2010-11-23 ENCOUNTER — Encounter (HOSPITAL_COMMUNITY): Payer: Self-pay | Admitting: Cardiology

## 2010-11-23 ENCOUNTER — Ambulatory Visit (INDEPENDENT_AMBULATORY_CARE_PROVIDER_SITE_OTHER): Payer: BC Managed Care – PPO | Admitting: *Deleted

## 2010-11-23 ENCOUNTER — Encounter: Payer: Self-pay | Admitting: *Deleted

## 2010-11-23 DIAGNOSIS — R0609 Other forms of dyspnea: Secondary | ICD-10-CM

## 2010-11-23 DIAGNOSIS — R0989 Other specified symptoms and signs involving the circulatory and respiratory systems: Secondary | ICD-10-CM

## 2010-11-23 DIAGNOSIS — R079 Chest pain, unspecified: Secondary | ICD-10-CM

## 2010-11-23 DIAGNOSIS — R0789 Other chest pain: Secondary | ICD-10-CM | POA: Insufficient documentation

## 2010-11-23 DIAGNOSIS — E785 Hyperlipidemia, unspecified: Secondary | ICD-10-CM | POA: Insufficient documentation

## 2010-11-23 DIAGNOSIS — I25810 Atherosclerosis of coronary artery bypass graft(s) without angina pectoris: Secondary | ICD-10-CM

## 2010-11-23 MED ORDER — TECHNETIUM TC 99M TETROFOSMIN IV KIT
10.0000 | PACK | Freq: Once | INTRAVENOUS | Status: AC | PRN
  Administered 2010-11-23: 10 via INTRAVENOUS

## 2010-11-23 MED ORDER — TECHNETIUM TC 99M TETROFOSMIN IV KIT
30.0000 | PACK | Freq: Once | INTRAVENOUS | Status: AC | PRN
  Administered 2010-11-23: 29.8 via INTRAVENOUS

## 2010-11-23 NOTE — Progress Notes (Signed)
Stress Lab Nurses Notes - [name] 11/23/2010  Reason for doing test: Dyspnea and Syncope  Type of test: Stress Myoview and Test Changed lexiscan  Nurse performing test: Marlena Clipper, RN  Nuclear Medicine Tech: Lyndel Pleasure  Echo Tech: Not Applicable  MD performing test: Alveria Apley NP  Family MD: Sanda Linger  Test explained and consent signed: yes  IV started: 22g jelco, Saline lock flushed, No redness or edema and Saline lock started in radiology  Symptoms: Pain in knees couldn't  Walk any more  Treatment/Intervention: None  Reason test stopped: fatigue and Knee Pain  After recovery IV was: Discontinued via X-ray tech and No redness or edema  Patient to return to Nuc. Med at :12:25  Patient discharged: Home  Patient's Condition upon discharge was: stable  Comments:   [name]

## 2010-11-23 NOTE — Telephone Encounter (Signed)
Pt called requesting results of stress test performed @ [facility] earlier today.  I pulled his report in echart (below) and discussed it with him, stating that it was a low risk study and likely would not require any additional diagnostic evaluation.  I also told him that our office would be in touch with him to more formally discuss the results.  He said that he's been very sob with work and wonders where his diagnostic evaluation will go next.  I advised that our office will contact him for f/u and to discuss any additional testing or referrals that may be necessary.  He verbalized understanding and said if he doesn't hear from our office tomorrow morning he will call back in the afternoon.   Low risk exercise/Lexiscan Myoview as outlined.  No diagnostic ST-   segment changes were noted.  No arrhythmias noted.  Perfusion   imaging shows apparent soft tissue attenuation affecting the apical   portion of the anteroseptal and inferior walls, without clear   evidence of ischemia.  LVEF is normal 55%.

## 2010-11-24 ENCOUNTER — Ambulatory Visit (HOSPITAL_COMMUNITY): Payer: BC Managed Care – PPO

## 2010-11-24 ENCOUNTER — Encounter (HOSPITAL_COMMUNITY): Payer: BC Managed Care – PPO

## 2010-11-24 ENCOUNTER — Encounter: Payer: BC Managed Care – PPO | Admitting: *Deleted

## 2010-11-24 LAB — BASIC METABOLIC PANEL
CO2: 26 mEq/L (ref 19–32)
Glucose, Bld: 80 mg/dL (ref 70–99)
Potassium: 4.5 mEq/L (ref 3.5–5.3)
Sodium: 137 mEq/L (ref 135–145)

## 2010-11-24 LAB — HEPATIC FUNCTION PANEL
ALT: <8 U/L (ref 0–53)
Albumin: 4.2 g/dL (ref 3.5–5.2)
Bilirubin, Direct: <0.1 mg/dL (ref 0.0–0.3)
Total Bilirubin: 0.4 mg/dL (ref 0.3–1.2)

## 2010-11-24 LAB — LIPID PANEL
Cholesterol: 184 mg/dL (ref 0–200)
HDL: 32 mg/dL — ABNORMAL LOW (ref >39)
Total CHOL/HDL Ratio: 5.8 Ratio
VLDL: 51 mg/dL — ABNORMAL HIGH (ref 0–40)

## 2010-11-27 ENCOUNTER — Ambulatory Visit (INDEPENDENT_AMBULATORY_CARE_PROVIDER_SITE_OTHER): Payer: BC Managed Care – PPO | Admitting: Internal Medicine

## 2010-11-27 ENCOUNTER — Encounter: Payer: Self-pay | Admitting: Internal Medicine

## 2010-11-27 DIAGNOSIS — Z23 Encounter for immunization: Secondary | ICD-10-CM

## 2010-11-27 DIAGNOSIS — J449 Chronic obstructive pulmonary disease, unspecified: Secondary | ICD-10-CM

## 2010-11-27 DIAGNOSIS — R0989 Other specified symptoms and signs involving the circulatory and respiratory systems: Secondary | ICD-10-CM

## 2010-11-27 DIAGNOSIS — R05 Cough: Secondary | ICD-10-CM

## 2010-11-27 DIAGNOSIS — I10 Essential (primary) hypertension: Secondary | ICD-10-CM

## 2010-11-27 MED ORDER — TIOTROPIUM BROMIDE MONOHYDRATE 18 MCG IN CAPS: 18.0000 ug | ORAL_CAPSULE | Freq: Every day | RESPIRATORY_TRACT | Status: DC

## 2010-11-27 NOTE — Assessment & Plan Note (Signed)
Start spiriva, pneumovax today, pulm referral for PFT's, begin applying for STD and LTD

## 2010-11-27 NOTE — Assessment & Plan Note (Signed)
His wife tells me that one of his heart doctors told him to apply for STD and LTD so they are going to begin that process.

## 2010-11-27 NOTE — Assessment & Plan Note (Signed)
Stop the ACEI and treat the lung disease

## 2010-11-27 NOTE — Assessment & Plan Note (Signed)
He is not willing to consider cessation of tobacco abuse

## 2010-11-27 NOTE — Patient Instructions (Signed)
Chronic Obstructive Pulmonary Disease (COPD) Chronic obstructive pulmonary disease (COPD) is a condition in which airflow from the lungs is restricted. The lungs can never return to normal, but there are measures you can take which will improve them and make you feel better. CAUSES  Smoking.   Breathing in irritants (pollution, cigarette smoke, strong odors, aerosol sprays, paint fumes).   History of lung infections.  TREATMENT  Treatment focuses on making you comfortable (supportive care).  HOME CARE INSTRUCTIONS  If you smoke, stop smoking. The carbon monoxide buildup in the blood robs you of your already short oxygen supply.   Take medicines (antibiotics) that kill germs as directed.   Avoid antihistamines and cough syrups. They dry up your system and slow down the elimination of secretions. This decreases respiratory capacity and may lead to infections.   Drink enough water and fluids to keep your urine clear or pale yellow. This loosens secretions.   Use humidifiers at home and at your bedside if they do not make breathing difficult.   Receive all protective vaccines your caregiver suggests, especially pneumococcal and influenza.   Use home oxygen as suggested.  SEEK MEDICAL CARE IF:  You develop pus-like mucus (sputum).   You have an oral temperature above 100.5.   Breathing is more labored or exercise becomes difficult to do.   You are running out of the medicine you take for your breathing.  SEEK IMMEDIATE MEDICAL CARE IF:  You have a rapid heart rate.   You have agitation, confusion, tremors, or are in a stupor (family members may need to observe this).   It becomes difficult to breathe.   You develop chest pain.   You have an oral temperature above 100.5, not controlled by medicine.  MAKE SURE YOU:   Understand these instructions.   Will watch your condition.   Will get help right away if you are not doing well or get worse.  Document Released:  01/17/2005 Document Re-Released: 07/04/2009 [employer] Patient Information 2011 [employer]. 

## 2010-11-27 NOTE — Assessment & Plan Note (Signed)
Stop the ACEI and follow his BP, if needed I will start an ARB in the future

## 2010-11-27 NOTE — Progress Notes (Signed)
Subjective:    Patient ID: [name], male    DOB: 06-21-1960, 50 y.o.   MRN: [id number]  Cough This is a chronic problem. The current episode started more than 1 year ago. The problem has been gradually worsening. The problem occurs every few hours. The cough is productive of brown sputum. Associated symptoms include shortness of breath. Pertinent negatives include no chest pain, chills, ear congestion, ear pain, eye redness, fever, headaches, heartburn, hemoptysis, myalgias, nasal congestion, postnasal drip, rash, rhinorrhea, sore throat, sweats, weight loss or wheezing. The symptoms are aggravated by fumes and stress. Risk factors for lung disease include smoking/tobacco exposure. He has tried nothing for the symptoms. There is no history of asthma, bronchiectasis, bronchitis, COPD, emphysema, environmental allergies or pneumonia.      Review of Systems  Constitutional: Negative for fever, chills, weight loss, diaphoresis, activity change, appetite change, fatigue and unexpected weight change.  HENT: Negative for ear pain, sore throat, facial swelling, rhinorrhea, trouble swallowing, neck pain, neck stiffness, voice change and postnasal drip.   Eyes: Negative for photophobia, pain, discharge, redness, itching and visual disturbance.  Respiratory: Positive for cough and shortness of breath. Negative for apnea, hemoptysis, choking, chest tightness, wheezing and stridor.   Cardiovascular: Negative for chest pain and palpitations.  Gastrointestinal: Negative for heartburn, nausea, vomiting, abdominal pain, diarrhea, constipation and blood in stool.  Genitourinary: Negative for dysuria, urgency, frequency, hematuria, flank pain, decreased urine volume, enuresis and difficulty urinating.  Musculoskeletal: Negative for myalgias, back pain, joint swelling, arthralgias and gait problem.  Skin: Negative for color change, pallor, rash and wound.  Neurological: Negative for dizziness, tremors,  seizures, syncope, facial asymmetry, speech difficulty, weakness, light-headedness, numbness and headaches.  Hematological: Negative for environmental allergies and adenopathy. Does not bruise/bleed easily.  Psychiatric/Behavioral: Negative.        Objective:   Physical Exam  Vitals reviewed. Constitutional: He is oriented to person, place, and time. He appears well-developed and well-nourished. No distress.  HENT:  Head: Normocephalic and atraumatic.  Right Ear: External ear normal.  Left Ear: External ear normal.  Nose: Nose normal.  Mouth/Throat: Oropharynx is clear and moist. No oropharyngeal exudate.  Eyes: Conjunctivae and EOM are normal. Pupils are equal, round, and reactive to light. Right eye exhibits no discharge. Left eye exhibits no discharge. No scleral icterus.  Neck: Normal range of motion. Neck supple. No JVD present. No tracheal deviation present. No thyromegaly present.  Cardiovascular: Normal rate, regular rhythm, normal heart sounds and intact distal pulses.  Exam reveals no gallop and no friction rub.   No murmur heard. Pulmonary/Chest: Effort normal and breath sounds normal. No stridor. No respiratory distress. He has no wheezes. He has no rales. He exhibits no tenderness.  Abdominal: Soft. Bowel sounds are normal. He exhibits no distension and no mass. There is no tenderness. There is no rebound and no guarding.  Musculoskeletal: Normal range of motion. He exhibits no edema and no tenderness.  Lymphadenopathy:    He has no cervical adenopathy.  Neurological: He is alert and oriented to person, place, and time. He has normal reflexes. He displays normal reflexes. No cranial nerve deficit. He exhibits normal muscle tone. Coordination normal.  Skin: Skin is warm and dry. No rash noted. He is not diaphoretic. No erythema. No pallor.  Psychiatric: He has a normal mood and affect. His behavior is normal. Judgment and thought content normal.      Lab Results    Component Value Date   WBC 7.6  04/14/2010   HGB 14.9 04/14/2010   HCT 43.4 04/14/2010   PLT 177.0 04/14/2010   CHOL 184 11/23/2010   TRIG 257* 11/23/2010   HDL 32* 11/23/2010   LDLDIRECT 124.8 06/28/2010   ALT <8 11/23/2010   AST 12 11/23/2010   NA 137 11/23/2010   K 4.5 11/23/2010   CL 102 11/23/2010   CREATININE 0.70 11/23/2010   BUN 16 11/23/2010   CO2 26 11/23/2010   TSH 2.35 04/14/2010   PSA 2.01 04/14/2010   HGBA1C 5.1 12/31/2008     Assessment & Plan:

## 2010-11-27 NOTE — Assessment & Plan Note (Signed)
This is related to his lung and heart disease, I think he should stay out of work for another month, time for the weather to cool and for the spiriva to help

## 2010-12-02 ENCOUNTER — Other Ambulatory Visit: Payer: Self-pay | Admitting: Cardiology

## 2010-12-04 ENCOUNTER — Telehealth: Payer: Self-pay

## 2010-12-04 MED ORDER — METOPROLOL SUCCINATE ER 100 MG PO TB24: 100.0000 mg | ORAL_TABLET | Freq: Every day | ORAL | Status: DC

## 2010-12-05 ENCOUNTER — Telehealth: Payer: Self-pay | Admitting: *Deleted

## 2010-12-05 NOTE — Telephone Encounter (Signed)
[employer] called - Answered questions about MD's dx

## 2010-12-13 NOTE — Telephone Encounter (Signed)
Closing.. See other phone note

## 2010-12-14 ENCOUNTER — Encounter: Payer: Self-pay | Admitting: Emergency Medicine

## 2010-12-14 ENCOUNTER — Ambulatory Visit (INDEPENDENT_AMBULATORY_CARE_PROVIDER_SITE_OTHER)
Admission: RE | Admit: 2010-12-14 | Discharge: 2010-12-14 | Disposition: A | Discharge status: Home / Self Care | Payer: BC Managed Care – PPO | Source: Ambulatory Visit | Attending: Emergency Medicine | Admitting: Emergency Medicine

## 2010-12-14 ENCOUNTER — Ambulatory Visit (INDEPENDENT_AMBULATORY_CARE_PROVIDER_SITE_OTHER): Payer: BC Managed Care – PPO | Admitting: Emergency Medicine

## 2010-12-14 ENCOUNTER — Encounter: Payer: Self-pay | Admitting: *Deleted

## 2010-12-14 VITALS — BP 138/76 | HR 84 | Temp 98.3°F | Ht 70.0 in | Wt 219.0 lb

## 2010-12-14 DIAGNOSIS — J449 Chronic obstructive pulmonary disease, unspecified: Secondary | ICD-10-CM

## 2010-12-14 MED ORDER — TIOTROPIUM BROMIDE MONOHYDRATE 18 MCG IN CAPS: 18.0000 ug | ORAL_CAPSULE | Freq: Every day | RESPIRATORY_TRACT | Status: DC

## 2010-12-14 NOTE — Assessment & Plan Note (Signed)
Suspect that this is COPD, agree with Spiriva although he is only using prn - continue spiriva qd  - start SABA prn - full PFT - CXR - a1-AT testing - walking oximetry  - rov next available

## 2010-12-14 NOTE — Patient Instructions (Addendum)
Please continue the Spiriva daily We will start ventolin 2 puffs up to every 4 hours if needed for shortness of breath We will perform full pulmonary function testing at your next office visit CXR today Walking oximetry today was normal today We will do bloodwork today You should not return to full duty work until after our follow up visit. We will reassess your ability to work at that visit We will work hard on stopping smoking in our future visits.  Follow up with Dr Delton Coombes next available with PFT

## 2010-12-14 NOTE — Progress Notes (Signed)
Subjective:    Patient ID: [name], male    DOB: 01-26-1961, 50 y.o.   MRN: [id number]  HPI 50 yo smoker (70+ pk-yrs), hx HTN, CAD, hyperlipidemia. Referred for evaluation of progressive exertional SOB. He has had more exercise limitation since 6 months, hasn't been able to do his normal work for 2 months. Underwent treadmill cardiac stress test August 3 - no active CAD seen. He has been coughing up brownish mucous. Had lost 41 lbs over about 3 months after he stopped drinking as much. Still smoking 2 pks/day.  Was started on Spiriva in early August, has not been reliably.  Has been on wellbutrin before, has been on chantix before, nicotine patches.    Review of Systems  Constitutional: Positive for unexpected weight change. Negative for fever, activity change, appetite change and fatigue.  HENT: Negative for congestion, rhinorrhea, sneezing, postnasal drip and sinus pressure.   Respiratory: Positive for cough (brown mucous) and shortness of breath. Negative for chest tightness, wheezing and stridor.   Cardiovascular: Negative for chest pain.  Musculoskeletal: Positive for arthralgias. Negative for back pain.    Past Medical History  Diagnosis Date  . Coronary artery disease 2002    Lesion in LAD s/p angioplasty  . Hypertension   . Hyperlipidemia   . Myocardial infarct      Family History  Problem Relation Age of Onset  . Cancer Neg Hx   . Heart disease Father      History   Social History  . Marital Status: Married    Spouse Name: N/A    Number of Children: 2  . Years of Education: N/A   Occupational History  . class A lineman    Social History Main Topics  . Smoking status: Current Everyday Smoker -- 2.0 packs/day for 35 years    Types: Cigarettes  . Smokeless tobacco: Never Used  . Alcohol Use: No  . Drug Use: No  . Sexually Active: Yes   Other Topics Concern  . Not on file   Social History Narrative  . No narrative on file     Allergies  Allergen  Reactions  . Lisinopril Cough     Outpatient Prescriptions Prior to Visit  Medication Sig Dispense Refill  . fenofibrate micronized (LOFIBRA) 134 MG capsule Take 134 mg by mouth daily before breakfast.       . ibuprofen (ADVIL,MOTRIN) 200 MG tablet Take 200 mg by mouth every 6 (six) hours as needed.        . metoprolol (TOPROL-XL) 100 MG 24 hr tablet Take 1 tablet (100 mg total) by mouth daily.  30 tablet  11  . nitroGLYCERIN (NITROSTAT) 0.4 MG SL tablet Place 1 tablet (0.4 mg total) under the tongue every 5 (five) minutes as needed for chest pain.  25 tablet  3  . simvastatin (ZOCOR) 20 MG tablet Take 20 mg by mouth at bedtime.       Marland Kitchen tiotropium (SPIRIVA HANDIHALER) 18 MCG inhalation capsule Place 1 capsule (18 mcg total) into inhaler and inhale daily.  30 capsule  0       Objective:   Physical Exam  Gen: Pleasant, well-nourished, in no distress,  normal affect  ENT: No lesions,  mouth clear,  oropharynx clear, no postnasal drip  Neck: No JVD, no TMG, no carotid bruits  Lungs: No use of accessory muscles, no dullness to percussion, very soft exp wheeze on forced exp  Cardiovascular: RRR, heart sounds normal, no murmur or gallops,  no peripheral edema  Musculoskeletal: No deformities, no cyanosis or clubbing  Neuro: alert, non focal  Skin: Warm, no lesions or rashes     Assessment & Plan:  COPD (chronic obstructive pulmonary disease) Suspect that this is COPD, agree with Spiriva although he is only using prn - continue spiriva qd  - start SABA prn - full PFT - CXR - a1-AT testing - walking oximetry  - rov next available

## 2010-12-20 ENCOUNTER — Other Ambulatory Visit: Payer: Self-pay | Admitting: Emergency Medicine

## 2010-12-21 ENCOUNTER — Telehealth: Payer: Self-pay | Admitting: *Deleted

## 2010-12-21 LAB — ALPHA-1-ANTITRYPSIN: A-1 Antitrypsin, Ser: 148 mg/dL (ref 90–200)

## 2010-12-21 NOTE — Telephone Encounter (Signed)
Pt called to see about the results of the cxr done last week.  Please advise of results.

## 2010-12-21 NOTE — Telephone Encounter (Signed)
Please let patient know that his CXR is stable, no changes from his priors. RSB

## 2010-12-22 NOTE — Telephone Encounter (Signed)
Pt's alpha-1 level is in the normal range

## 2010-12-22 NOTE — Telephone Encounter (Signed)
Pt's spouse is aware of cxr results per Dr. Delton Coombes. Alpha 1 results have been received and placed in RB look at. Pls advise on Alpha 1 result. I don't think the correct test was ordered.

## 2010-12-26 NOTE — Telephone Encounter (Signed)
Pt and spouse notified.

## 2011-01-05 ENCOUNTER — Telehealth: Payer: Self-pay | Admitting: Emergency Medicine

## 2011-01-05 ENCOUNTER — Encounter: Payer: Self-pay | Admitting: Emergency Medicine

## 2011-01-05 ENCOUNTER — Ambulatory Visit (INDEPENDENT_AMBULATORY_CARE_PROVIDER_SITE_OTHER): Payer: Medicaid Other | Admitting: Emergency Medicine

## 2011-01-05 VITALS — BP 130/72 | HR 77 | Temp 97.6°F | Ht 70.0 in | Wt 224.0 lb

## 2011-01-05 DIAGNOSIS — J449 Chronic obstructive pulmonary disease, unspecified: Secondary | ICD-10-CM

## 2011-01-05 LAB — PULMONARY FUNCTION TEST

## 2011-01-05 NOTE — Progress Notes (Signed)
  Subjective:    Patient ID: [name], male    DOB: Oct 06, 1960, 50 y.o.   MRN: [id number]  HPI 50 yo smoker (70+ pk-yrs), hx HTN, CAD, hyperlipidemia. Referred for evaluation of progressive exertional SOB. He has had more exercise limitation since 6 months, hasn't been able to do his normal work for 2 months. Underwent treadmill cardiac stress test August 3 - no active CAD seen. He has been coughing up brownish mucous. Had lost 41 lbs over about 3 months after he stopped drinking as much. Still smoking 2 pks/day.  Was started on Spiriva in early August, has not been reliably.  Has been on wellbutrin before, has been on chantix before, nicotine patches.   ROV 01/05/11 -- follow up for dyspnea, tobacco abuse. Had PFT today that show possible mild AFL, normal volumes, normal DLCO.  He hasn't been able to go back to work - was terminated. Has been on Spiriva every day, has only used SABA once, not sure it helped. Still with cough prod brownish phlegm. No hypoxemia with exertion.     Objective:   Physical Exam  Gen: Pleasant, well-nourished, in no distress,  normal affect  ENT: No lesions,  mouth clear,  oropharynx clear, no postnasal drip  Neck: No JVD, no TMG, no carotid bruits  Lungs: No use of accessory muscles, no dullness to percussion, very soft exp wheeze on forced exp  Cardiovascular: RRR, heart sounds normal, no murmur or gallops, no peripheral edema  Musculoskeletal: No deformities, no cyanosis or clubbing  Neuro: alert, non focal  Skin: Warm, no lesions or rashes     Assessment & Plan:  COPD (chronic obstructive pulmonary disease) PFT essentially normal. Cardiac eval reassuring. No hypoxemia. Etiology of his dyspnea is unclear - discussed smoking  Cessation.  - stop spiriva - continue SABA prn - will not fill out disability paper in absence significant  AFL - cardiopulmonary stress test

## 2011-01-05 NOTE — Assessment & Plan Note (Signed)
PFT essentially normal. Cardiac eval reassuring. No hypoxemia. Etiology of his dyspnea is unclear - discussed smoking  Cessation.  - stop spiriva - continue SABA prn - will not fill out disability paper in absence significant  AFL - cardiopulmonary stress test

## 2011-01-05 NOTE — Patient Instructions (Signed)
Stop Spiriva Continue to use your rescue inhaler as needed We will perform a cardiopulmonary exercise test to better evaluate your shortness of breath Follow up with Dr Delton Coombes in 1 month to review the test results.

## 2011-01-05 NOTE — Telephone Encounter (Signed)
lmomtcb for pt 

## 2011-01-05 NOTE — Progress Notes (Signed)
PFT done today. 

## 2011-01-08 ENCOUNTER — Ambulatory Visit (HOSPITAL_COMMUNITY): Payer: Medicaid Other

## 2011-01-08 ENCOUNTER — Telehealth: Payer: Self-pay | Admitting: Emergency Medicine

## 2011-01-08 NOTE — Telephone Encounter (Signed)
Duplicate phone message - pls see phone message dated for 01/05/11 for additional information.

## 2011-01-08 NOTE — Telephone Encounter (Signed)
I called and spoke with pt's wife so states " I just spoke with Raliegh Scarlet about 10 minutes ago."       Type  Contact  Phone   01/08/2011 10:53 AM  Phone (Incoming)  [place] (Emergency Contact)  [phone]   PATIENT'S WIFE SAYS PATIENT NEEDS ATTENDING PHYSICIAN FORM AND OFFICE NOTES FOR PERIOD OF TIME OUT OF WORK, 12/14/10 TO 01/05/11. BECAUSE [employer] DID NOT HAVE FORM AND NOTES, EMPLOYER STOPPED HIS BENEFITS. THEY ARE DESPERATE       However, she thinks there may have been some misunderstanding when they were here for OV regarding forms for disability.  She is requesting RB sign the Attending Physician Report stating he took pt out of work from 12/14/10 - 01/05/2011 due to his medical condition at that time.  She states they are not requesting to extend these dates just to have this form completed for the days RB took pt out for.  OV notes from these dates will also need to be sent back to [place] with these forms.  She is requesting this be taken care of ASAP bc benefits have be stopped until this is taken care of.  I apologized for any misunderstand and confusion and advised pt I would speak with Raliegh Scarlet and we would work on getting this taken care of.  She is requesting we call both her cell number and house number and [email or web address] if no one answers.    Tammy D spoke with Dr. Delton Coombes regarding this.  Per Tammy, Dr. Delton Coombes would not fill these forms out because of the diagnosis on the forms which was inaccurate and this was explained to pt during OV.  However, if they can have another form faxed back to [place], we will discuss this with RB to see if he will fill it out accordingly.    I called pt's wife back and explained this to her.  She states she does not have access to these forms bc [employer] has been faxing over everything.  She will call [employer] today and see if they will refax this form to Tammy D. Attn to triage fax number.  Tammy D. Is aware.  Will hold message in triage until form is  received.

## 2011-01-08 NOTE — Telephone Encounter (Signed)
Patient calling back.  [phone]

## 2011-01-08 NOTE — Telephone Encounter (Signed)
Ms. Jean is checking to see if we have rec'd the faxed documents.  Please call back at:  [phone].  Thanks.  Antionette Fairy

## 2011-01-08 NOTE — Telephone Encounter (Signed)
Pt's wife called in stating she would like to speak w/ someone before 11:00 this morning.  Pt wife stated that her husband's disability pay has been messed up & would like for [place] to help.  Pt 's wife stated that she needs the form to be completed & RB's office notes from 8/23-9/14 to be turned in for disability to cover.  Pt is very upset & worried about getting this done.  Please call ASAP.  Antionette Fairy

## 2011-01-08 NOTE — Telephone Encounter (Signed)
I called to see when CPST was rescheduled for and there were no pending appts for this. I spoke with Dawn and according to her, this procedure can be approved through [place] [employer] the day of the procedure. The test has been rescheduled for Mon., 02/05/2011 @ 1:30pm. Pt will need to arrive to [facility] admitting by 1:15pm. I have left a msg for the pt or his spouse to return my call so they can be informed of this. If this appt date and time does not work for the pt they can call [phone] to reschedule.  Both the test and follow-up are needed before 02/10/2011.

## 2011-01-08 NOTE — Telephone Encounter (Signed)
Pt's wife returning call very upset ranting and raving about form that needs to be filled out can be reached at [phone].Raylene Everts

## 2011-01-08 NOTE — Telephone Encounter (Signed)
Form has been completed by Dr. Delton Coombes and I have left a msg for the pt's spouse to return my call. There is an area on the form that the pt must sign before we can fax this. I have already printed the OV notes from 12/14/2010 and 01/05/2011.  Pt did not keep his appt today for CPST and we need to know if this was rescheduled by the pt. {t meeds tp jave CPST and a f/u OV with Dr. Delton Coombes before 02/10/2011.  Awaiting callback.

## 2011-01-10 ENCOUNTER — Telehealth: Payer: Self-pay | Admitting: Emergency Medicine

## 2011-01-10 NOTE — Telephone Encounter (Signed)
CPST is scheduled for Thurs., 9/20 @ 11:30am. Left detailed msg to let the pt and spouse know that they were correct about the appt date and time. Nothing further is needed.

## 2011-01-10 NOTE — Telephone Encounter (Signed)
Pt and spouse aware CPST is scheduled for Thurs., 920 @ 11:30am at [facility] and we will call with results once RB has reviewed.

## 2011-01-11 ENCOUNTER — Ambulatory Visit (HOSPITAL_COMMUNITY): Payer: Medicaid Other | Attending: Emergency Medicine

## 2011-01-11 DIAGNOSIS — J449 Chronic obstructive pulmonary disease, unspecified: Secondary | ICD-10-CM

## 2011-01-11 DIAGNOSIS — R0602 Shortness of breath: Secondary | ICD-10-CM | POA: Insufficient documentation

## 2011-01-16 ENCOUNTER — Telehealth: Payer: Self-pay | Admitting: Emergency Medicine

## 2011-01-16 NOTE — Telephone Encounter (Signed)
Please advise Dr. Delton Coombes pt is wanting his CPST results. Thanks  Carver Fila, CMA

## 2011-01-16 NOTE — Telephone Encounter (Signed)
Pt's wife has called in again & would like results today.  [phone]  Antionette Fairy

## 2011-01-16 NOTE — Telephone Encounter (Signed)
Spoke with pt's spouse and advised that RB out of the office until Friday 01/19/11 and will forward msg to him so he can advise on results. She is fine with waiting until then, but wants results asap because the pt has been out of work since he has been ill and if results are normal will need note to go back. Please advise, thanks!

## 2011-01-18 ENCOUNTER — Encounter: Payer: Self-pay | Admitting: Emergency Medicine

## 2011-01-19 NOTE — Telephone Encounter (Signed)
Please notify that the CPEX has not been interpreted yet. Will need to contact her next week after reviewing with interpreting MD.

## 2011-01-22 NOTE — Telephone Encounter (Signed)
Pt wife advised. Lerline Valdivia, CMA  

## 2011-01-23 ENCOUNTER — Telehealth: Payer: Self-pay | Admitting: Emergency Medicine

## 2011-01-23 NOTE — Telephone Encounter (Signed)
Please inform pt that I still do not have final read on his CPEX (it takes a while for these to be interpreted). His CXR shows some mild changes consistent with smoking, no other abnormalities.

## 2011-01-23 NOTE — Telephone Encounter (Signed)
I called and spoke with pt.  I informed him RB still does not have the final report on the CPEX test as it takes a while for these to be interpreted.  I also informed RB states CXR shows some mile changes consistent with smoking, no other abnormalities.  He verbalized understanding of this.

## 2011-01-23 NOTE — Telephone Encounter (Signed)
Dr Delton Coombes, pls advise on cxr and lab results, thanks!

## 2011-01-29 ENCOUNTER — Other Ambulatory Visit: Payer: Self-pay | Admitting: Internal Medicine

## 2011-01-29 ENCOUNTER — Telehealth: Payer: Self-pay | Admitting: Emergency Medicine

## 2011-01-29 NOTE — Telephone Encounter (Signed)
I spoke with pt wife and they are requesting pt cardiopulmonary exercise test results done on 01/15/11 and is wanting these results today. She also states they need to know if Dr. Delton Coombes is still planning on releasing pt back to work on 02/09/11. Please advise Dr. Delton Coombes. Thanks   Carver Fila, CMA

## 2011-01-30 DIAGNOSIS — R0602 Shortness of breath: Secondary | ICD-10-CM

## 2011-01-30 NOTE — Telephone Encounter (Signed)
Spoke with pt's wife - CPST shows decreased exercise capacity, sub-maximal effort, ventilatory reserve, sub-maximal HR response (on metoprolol). I don't see clear evidence for a pulmonary or cardiac limitation. Will review further w Dr Marchelle Gearing. I think the pt can go back to work, I will complete paperwork for him to return. Wife will bring it for me to sign

## 2011-02-05 ENCOUNTER — Other Ambulatory Visit: Payer: Self-pay | Admitting: *Deleted

## 2011-02-05 MED ORDER — FENOFIBRATE MICRONIZED 134 MG PO CAPS: 134.0000 mg | ORAL_CAPSULE | Freq: Every day | ORAL | Status: DC

## 2011-02-07 ENCOUNTER — Telehealth: Payer: Self-pay | Admitting: *Deleted

## 2011-02-07 DIAGNOSIS — E782 Mixed hyperlipidemia: Secondary | ICD-10-CM

## 2011-02-07 MED ORDER — FENOFIBRATE 145 MG PO TABS: 145.0000 mg | ORAL_TABLET | Freq: Every day | ORAL | Status: DC

## 2011-02-07 NOTE — Telephone Encounter (Signed)
pts insurance will not pay for fenofibrate. They have a preferred list and states he must try Tricor or gemfibrozol what do you advise for pt?

## 2011-02-07 NOTE — Telephone Encounter (Signed)
done

## 2011-02-13 ENCOUNTER — Telehealth: Payer: Self-pay

## 2011-02-13 NOTE — Telephone Encounter (Signed)
Received pharmacy rejection stating that insurance will not cover fenofibrate without a prior authorization. Preferred alternatives are Trilipix, Tricor, or gemfibrozil. Please advise if you want to proceed with PA or switch medication. Thanks

## 2011-02-14 NOTE — Telephone Encounter (Signed)
This has already been changed to tricor

## 2011-02-23 ENCOUNTER — Other Ambulatory Visit: Payer: Self-pay | Admitting: Internal Medicine

## 2011-02-23 DIAGNOSIS — E782 Mixed hyperlipidemia: Secondary | ICD-10-CM

## 2011-02-23 MED ORDER — CHOLINE FENOFIBRATE 135 MG PO CPDR: 135.0000 mg | DELAYED_RELEASE_CAPSULE | Freq: Every day | ORAL | Status: DC

## 2011-03-22 ENCOUNTER — Telehealth: Payer: Self-pay | Admitting: *Deleted

## 2011-03-22 MED ORDER — FENOFIBRATE 160 MG PO TABS: 160.0000 mg | ORAL_TABLET | Freq: Every day | ORAL | Status: DC

## 2011-03-22 NOTE — Telephone Encounter (Signed)
ok 

## 2011-03-22 NOTE — Telephone Encounter (Signed)
Pt needs fenofibrate 160, he spoke with his pharmacist and she states this would be a cheaper alterantive for pt. What do you advise

## 2011-08-29 ENCOUNTER — Other Ambulatory Visit: Payer: Self-pay

## 2011-08-29 MED ORDER — SIMVASTATIN 20 MG PO TABS: 20.0000 mg | ORAL_TABLET | Freq: Every day | ORAL | Status: DC

## 2011-11-09 ENCOUNTER — Ambulatory Visit: Payer: Medicaid Other | Admitting: Internal Medicine

## 2011-12-31 ENCOUNTER — Encounter (HOSPITAL_COMMUNITY): Payer: Self-pay | Admitting: Nurse Practitioner

## 2011-12-31 ENCOUNTER — Inpatient Hospital Stay (HOSPITAL_COMMUNITY)
Admission: EM | Admit: 2011-12-31 | Discharge: 2012-01-07 | DRG: 234 | Disposition: A | Discharge status: Home / Self Care | Payer: Medicaid Other | Attending: Cardiothoracic Surgery | Admitting: Cardiothoracic Surgery

## 2011-12-31 ENCOUNTER — Other Ambulatory Visit: Payer: Self-pay

## 2011-12-31 ENCOUNTER — Emergency Department (HOSPITAL_COMMUNITY): Payer: Medicaid Other

## 2011-12-31 DIAGNOSIS — Z720 Tobacco use: Secondary | ICD-10-CM

## 2011-12-31 DIAGNOSIS — J449 Chronic obstructive pulmonary disease, unspecified: Secondary | ICD-10-CM | POA: Diagnosis present

## 2011-12-31 DIAGNOSIS — Z8673 Personal history of transient ischemic attack (TIA), and cerebral infarction without residual deficits: Secondary | ICD-10-CM

## 2011-12-31 DIAGNOSIS — D696 Thrombocytopenia, unspecified: Secondary | ICD-10-CM | POA: Diagnosis present

## 2011-12-31 DIAGNOSIS — E785 Hyperlipidemia, unspecified: Secondary | ICD-10-CM | POA: Diagnosis present

## 2011-12-31 DIAGNOSIS — I200 Unstable angina: Secondary | ICD-10-CM | POA: Diagnosis present

## 2011-12-31 DIAGNOSIS — I10 Essential (primary) hypertension: Secondary | ICD-10-CM

## 2011-12-31 DIAGNOSIS — J4489 Other specified chronic obstructive pulmonary disease: Secondary | ICD-10-CM | POA: Diagnosis present

## 2011-12-31 DIAGNOSIS — E782 Mixed hyperlipidemia: Secondary | ICD-10-CM

## 2011-12-31 DIAGNOSIS — I252 Old myocardial infarction: Secondary | ICD-10-CM

## 2011-12-31 DIAGNOSIS — F17200 Nicotine dependence, unspecified, uncomplicated: Secondary | ICD-10-CM | POA: Diagnosis present

## 2011-12-31 DIAGNOSIS — Z951 Presence of aortocoronary bypass graft: Secondary | ICD-10-CM

## 2011-12-31 DIAGNOSIS — I2510 Atherosclerotic heart disease of native coronary artery without angina pectoris: Principal | ICD-10-CM | POA: Diagnosis present

## 2011-12-31 DIAGNOSIS — E871 Hypo-osmolality and hyponatremia: Secondary | ICD-10-CM | POA: Diagnosis present

## 2011-12-31 DIAGNOSIS — Z91199 Patient's noncompliance with other medical treatment and regimen due to unspecified reason: Secondary | ICD-10-CM

## 2011-12-31 DIAGNOSIS — E8779 Other fluid overload: Secondary | ICD-10-CM | POA: Diagnosis present

## 2011-12-31 DIAGNOSIS — Z9861 Coronary angioplasty status: Secondary | ICD-10-CM

## 2011-12-31 DIAGNOSIS — Z9119 Patient's noncompliance with other medical treatment and regimen: Secondary | ICD-10-CM

## 2011-12-31 DIAGNOSIS — D62 Acute posthemorrhagic anemia: Secondary | ICD-10-CM | POA: Diagnosis present

## 2011-12-31 DIAGNOSIS — R079 Chest pain, unspecified: Secondary | ICD-10-CM

## 2011-12-31 HISTORY — DX: Chronic obstructive pulmonary disease, unspecified: J44.9

## 2011-12-31 LAB — BASIC METABOLIC PANEL
BUN: 10 mg/dL (ref 6–23)
CO2: 27 mEq/L (ref 19–32)
Calcium: 9.1 mg/dL (ref 8.4–10.5)
Creatinine, Ser: 0.9 mg/dL (ref 0.50–1.35)
GFR calc non Af Amer: >90 mL/min (ref >90)
Glucose, Bld: 120 mg/dL — ABNORMAL HIGH (ref 70–99)
Sodium: 132 mEq/L — ABNORMAL LOW (ref 135–145)

## 2011-12-31 LAB — COMPREHENSIVE METABOLIC PANEL
ALT: 21 U/L (ref 0–53)
Albumin: 3.8 g/dL (ref 3.5–5.2)
Calcium: 9.3 mg/dL (ref 8.4–10.5)
GFR calc Af Amer: >90 mL/min (ref >90)
Glucose, Bld: 102 mg/dL — ABNORMAL HIGH (ref 70–99)
Sodium: 133 mEq/L — ABNORMAL LOW (ref 135–145)
Total Protein: 7.2 g/dL (ref 6.0–8.3)

## 2011-12-31 LAB — HEPARIN LEVEL (UNFRACTIONATED): Heparin Unfractionated: <0.1 IU/mL — ABNORMAL LOW (ref 0.30–0.70)

## 2011-12-31 LAB — PROTIME-INR: Prothrombin Time: 13.4 seconds (ref 11.6–15.2)

## 2011-12-31 LAB — CBC
Hemoglobin: 14.4 g/dL (ref 13.0–17.0)
MCH: 30.4 pg (ref 26.0–34.0)
MCHC: 34.4 g/dL (ref 30.0–36.0)
MCV: 88.2 fL (ref 78.0–100.0)

## 2011-12-31 LAB — MAGNESIUM: Magnesium: 2.3 mg/dL (ref 1.5–2.5)

## 2011-12-31 LAB — TROPONIN I: Troponin I: <0.3 ng/mL (ref <0.30)

## 2011-12-31 MED ORDER — NITROGLYCERIN 2 % TD OINT
TOPICAL_OINTMENT | TRANSDERMAL | Status: AC
  Filled 2011-12-31: qty 1

## 2011-12-31 MED ORDER — SODIUM CHLORIDE 0.9 % IJ SOLN: 3.0000 mL | INTRAMUSCULAR | Status: DC | PRN

## 2011-12-31 MED ORDER — MORPHINE SULFATE 4 MG/ML IJ SOLN
INTRAMUSCULAR | Status: AC
  Filled 2011-12-31: qty 1

## 2011-12-31 MED ORDER — NITROGLYCERIN IN D5W 200-5 MCG/ML-% IV SOLN
5.0000 ug/min | INTRAVENOUS | Status: DC
  Administered 2011-12-31: 5 ug/min via INTRAVENOUS
  Filled 2011-12-31: qty 250

## 2011-12-31 MED ORDER — MORPHINE SULFATE 2 MG/ML IJ SOLN
4.0000 mg | Freq: Once | INTRAMUSCULAR | Status: AC
  Administered 2011-12-31: 4 mg via INTRAVENOUS
  Filled 2011-12-31: qty 2

## 2011-12-31 MED ORDER — ONDANSETRON HCL 4 MG/2ML IJ SOLN
4.0000 mg | Freq: Once | INTRAMUSCULAR | Status: AC
  Administered 2011-12-31: 4 mg via INTRAVENOUS

## 2011-12-31 MED ORDER — NITROGLYCERIN IN D5W 200-5 MCG/ML-% IV SOLN
3.0000 ug/min | INTRAVENOUS | Status: DC
  Administered 2012-01-02 (×2): 5 ug/min

## 2011-12-31 MED ORDER — SODIUM CHLORIDE 0.9 % IJ SOLN
3.0000 mL | Freq: Two times a day (BID) | INTRAMUSCULAR | Status: DC
  Administered 2011-12-31: 3 mL via INTRAVENOUS

## 2011-12-31 MED ORDER — HEPARIN (PORCINE) IN NACL 100-0.45 UNIT/ML-% IJ SOLN
1850.0000 [IU]/h | INTRAMUSCULAR | Status: DC
  Administered 2011-12-31: 1400 [IU]/h via INTRAVENOUS
  Administered 2012-01-01: 1850 [IU]/h via INTRAVENOUS
  Filled 2011-12-31 (×2): qty 250

## 2011-12-31 MED ORDER — ATORVASTATIN CALCIUM 40 MG PO TABS
40.0000 mg | ORAL_TABLET | Freq: Every day | ORAL | Status: DC
  Administered 2011-12-31 – 2012-01-06 (×6): 40 mg via ORAL
  Filled 2011-12-31 (×8): qty 1

## 2011-12-31 MED ORDER — MORPHINE SULFATE 4 MG/ML IJ SOLN
4.0000 mg | Freq: Once | INTRAMUSCULAR | Status: AC
  Administered 2011-12-31: 4 mg via INTRAVENOUS

## 2011-12-31 MED ORDER — METOPROLOL TARTRATE 25 MG PO TABS
25.0000 mg | ORAL_TABLET | Freq: Two times a day (BID) | ORAL | Status: DC
  Administered 2011-12-31 – 2012-01-01 (×2): 25 mg via ORAL
  Filled 2011-12-31 (×5): qty 1

## 2011-12-31 MED ORDER — DIAZEPAM 5 MG PO TABS
5.0000 mg | ORAL_TABLET | ORAL | Status: AC
  Administered 2012-01-01: 5 mg via ORAL
  Filled 2011-12-31: qty 1

## 2011-12-31 MED ORDER — LORAZEPAM 2 MG/ML IJ SOLN
1.0000 mg | Freq: Once | INTRAMUSCULAR | Status: AC
  Administered 2011-12-31: 1 mg via INTRAVENOUS

## 2011-12-31 MED ORDER — HEPARIN BOLUS VIA INFUSION
3000.0000 [IU] | Freq: Once | INTRAVENOUS | Status: AC
  Administered 2011-12-31: 3000 [IU] via INTRAVENOUS
  Filled 2011-12-31: qty 3000

## 2011-12-31 MED ORDER — MORPHINE SULFATE 2 MG/ML IJ SOLN
INTRAMUSCULAR | Status: AC
  Filled 2011-12-31: qty 1

## 2011-12-31 MED ORDER — METOPROLOL TARTRATE 1 MG/ML IV SOLN
5.0000 mg | Freq: Once | INTRAVENOUS | Status: AC
  Administered 2011-12-31: 5 mg via INTRAVENOUS
  Filled 2011-12-31: qty 5

## 2011-12-31 MED ORDER — ONDANSETRON HCL 4 MG/2ML IJ SOLN
INTRAMUSCULAR | Status: AC
  Filled 2011-12-31: qty 2

## 2011-12-31 MED ORDER — MORPHINE SULFATE 2 MG/ML IJ SOLN
2.0000 mg | Freq: Once | INTRAMUSCULAR | Status: AC
  Administered 2011-12-31: 2 mg via INTRAVENOUS

## 2011-12-31 MED ORDER — NITROGLYCERIN 2 % TD OINT
1.0000 [in_us] | TOPICAL_OINTMENT | Freq: Once | TRANSDERMAL | Status: AC
  Administered 2011-12-31: 1 [in_us] via TOPICAL

## 2011-12-31 MED ORDER — NITROGLYCERIN 0.4 MG SL SUBL
0.4000 mg | SUBLINGUAL_TABLET | SUBLINGUAL | Status: DC | PRN
  Administered 2011-12-31: 0.4 mg via SUBLINGUAL
  Filled 2011-12-31: qty 25

## 2011-12-31 MED ORDER — ACETAMINOPHEN 325 MG PO TABS: 650.0000 mg | ORAL_TABLET | ORAL | Status: DC | PRN

## 2011-12-31 MED ORDER — ASPIRIN EC 81 MG PO TBEC
81.0000 mg | DELAYED_RELEASE_TABLET | Freq: Every day | ORAL | Status: DC
  Filled 2011-12-31 (×2): qty 1

## 2011-12-31 MED ORDER — LORAZEPAM 2 MG/ML IJ SOLN
INTRAMUSCULAR | Status: AC
  Filled 2011-12-31: qty 1

## 2011-12-31 MED ORDER — SODIUM CHLORIDE 0.9 % IV SOLN
INTRAVENOUS | Status: DC
  Administered 2012-01-01: 04:00:00 via INTRAVENOUS

## 2011-12-31 MED ORDER — HEPARIN (PORCINE) IN NACL 100-0.45 UNIT/ML-% IJ SOLN
1000.0000 [IU]/h | INTRAMUSCULAR | Status: DC
  Administered 2011-12-31: 1000 [IU]/h via INTRAVENOUS
  Filled 2011-12-31: qty 250

## 2011-12-31 MED ORDER — HEPARIN BOLUS VIA INFUSION
4000.0000 [IU] | Freq: Once | INTRAVENOUS | Status: AC
  Administered 2011-12-31: 4000 [IU] via INTRAVENOUS

## 2011-12-31 MED ORDER — ASPIRIN 81 MG PO CHEW
324.0000 mg | CHEWABLE_TABLET | ORAL | Status: AC
  Administered 2012-01-01: 324 mg via ORAL
  Filled 2011-12-31: qty 4

## 2011-12-31 MED ORDER — SODIUM CHLORIDE 0.9 % IV SOLN
250.0000 mL | INTRAVENOUS | Status: DC | PRN
  Administered 2011-12-31: 22:00:00 via INTRAVENOUS

## 2011-12-31 NOTE — ED Notes (Signed)
Pt resting  In bed lying still at present moment after given IV morphine. Wife at bsd.

## 2011-12-31 NOTE — ED Notes (Signed)
[name] Sec. Called bed control concerning bed assignment for pt

## 2011-12-31 NOTE — ED Notes (Signed)
Nitro paste removed and wiped off pt chest.

## 2011-12-31 NOTE — ED Notes (Signed)
Pt left via [facility] 

## 2011-12-31 NOTE — ED Notes (Signed)
Pt reports pain has increased again to 5. Dr. Adriana Simas made aware new order received for 4mg  Morphine.

## 2011-12-31 NOTE — ED Notes (Signed)
Pt still c/o cp at being 6. Dr. Adriana Simas made aware.

## 2011-12-31 NOTE — ED Provider Notes (Signed)
History   This chart was scribed for Donnetta Hutching, MD by Gerlean Ren. This patient was seen in room [facility] and the patient's care was started at 8:18AM.   CSN: [id number]  Arrival date & time 12/31/11  0754   First MD Initiated Contact with Patient 12/31/11 [phone]      Chief Complaint  Patient presents with  . Chest Pain    (Consider location/radiation/quality/duration/timing/severity/associated sxs/prior treatment) Patient is a 51 y.o. male presenting with chest pain. The history is provided by the patient. No language interpreter was used.  Chest Pain    [name] is a 51 y.o. male who presents to the Emergency Department complaining of non-radiating, gradually worsening, constant precordial and left side chest pain for one hour.  Pt has h/o MI at 51y.o and 51y.o.  Pt has 4 stents total, two each after each MI.Marland Kitchen  Pt reports associated nausea.  Pt denies SOB.  Pt has taken 3 nitroglycerine and 4 aspirin with mild improvement.  Pt has h/o HTN and CAD. COPD, hypercholesterolemia.  Pt is a current  smoker, 2packs/day for past 35 years. PCP is Dr. Yetta Barre at [place].  Past Medical History  Diagnosis Date  . Coronary artery disease 2002    Lesion in LAD s/p angioplasty  . Hypertension   . Hyperlipidemia   . Myocardial infarct     Past Surgical History  Procedure Date  . Coronary angioplasty with stent placement 11/1997, 11/2000  . Knee surgery     left x 2    Family History  Problem Relation Age of Onset  . Cancer Neg Hx   . Heart disease Father     History  Substance Use Topics  . Smoking status: Current Everyday Smoker -- 2.0 packs/day for 35 years    Types: Cigarettes  . Smokeless tobacco: Never Used  . Alcohol Use: No      Review of Systems  Cardiovascular: Positive for chest pain.  All other systems reviewed and are negative.    Allergies  Lisinopril  Home Medications   Current Outpatient Rx  Name Route Sig Dispense Refill  . ALBUTEROL SULFATE HFA  108 (90 BASE) MCG/ACT IN AERS Inhalation Inhale 2 puffs into the lungs every 6 (six) hours as needed.      . ASPIRIN 81 MG PO TABS Oral Take 81 mg by mouth daily.      . CHOLINE FENOFIBRATE 135 MG PO CPDR Oral Take 1 capsule (135 mg total) by mouth daily. 90 capsule 3  . FENOFIBRATE 160 MG PO TABS Oral Take 1 tablet (160 mg total) by mouth daily. 60 tablet 1  . IBUPROFEN 200 MG PO TABS Oral Take 200 mg by mouth every 6 (six) hours as needed.      Marland Kitchen METOPROLOL SUCCINATE ER 100 MG PO TB24 Oral Take 1 tablet (100 mg total) by mouth daily. 30 tablet 11  . NITROGLYCERIN 0.4 MG SL SUBL Sublingual Place 1 tablet (0.4 mg total) under the tongue every 5 (five) minutes as needed for chest pain. 25 tablet 3  . SIMVASTATIN 20 MG PO TABS Oral Take 1 tablet (20 mg total) by mouth at bedtime. 30 tablet 1  . TIOTROPIUM BROMIDE MONOHYDRATE 18 MCG IN CAPS Inhalation Place 1 capsule (18 mcg total) into inhaler and inhale daily. 30 capsule 1    BP 159/93  Temp 98.6 F (37 C) (Oral)  Resp 16  SpO2 97%  Physical Exam  Nursing note and vitals reviewed. Constitutional: He is  oriented to person, place, and time. He appears well-developed and well-nourished.       Pt is pale and anxious.  HENT:  Head: Normocephalic and atraumatic.  Eyes: Conjunctivae and EOM are normal. Pupils are equal, round, and reactive to light.  Neck: Normal range of motion. Neck supple.  Cardiovascular: Normal rate, regular rhythm and normal heart sounds.   Pulmonary/Chest: Effort normal and breath sounds normal.  Abdominal: Soft. Bowel sounds are normal.  Musculoskeletal: Normal range of motion.  Neurological: He is alert and oriented to person, place, and time.  Skin: Skin is warm and dry.  Psychiatric: He has a normal mood and affect.    ED Course  Procedures (including critical care time) DIAGNOSTIC STUDIES: Oxygen Saturation is 97% on [place], adequate by my interpretation.    COORDINATION OF CARE: 8:15AM- Ordered cardiac labs,  CXR, EKG, and IV morphine, adivan, and Zofran.   No results found.   No diagnosis found.  Results for orders placed during the hospital encounter of 12/31/11  CBC      Component Value Range   WBC 7.1  4.0 - 10.5 K/uL   RBC 4.74  4.22 - 5.81 MIL/uL   Hemoglobin 14.4  13.0 - 17.0 g/dL   HCT 78.2  95.6 - 21.3 %   MCV 88.2  78.0 - 100.0 fL   MCH 30.4  26.0 - 34.0 pg   MCHC 34.4  30.0 - 36.0 g/dL   RDW 08.6  57.8 - 46.9 %   Platelets 159  150 - 400 K/uL  BASIC METABOLIC PANEL      Component Value Range   Sodium 132 (*) 135 - 145 mEq/L   Potassium 4.1  3.5 - 5.1 mEq/L   Chloride 96  96 - 112 mEq/L   CO2 27  19 - 32 mEq/L   Glucose, Bld 120 (*) 70 - 99 mg/dL   BUN 10  6 - 23 mg/dL   Creatinine, Ser 6.29  0.50 - 1.35 mg/dL   Calcium 9.1  8.4 - 52.8 mg/dL   GFR calc non Af Amer >90  >90 mL/min   GFR calc Af Amer >90  >90 mL/min  TROPONIN I      Component Value Range   Troponin I <0.30  <0.30 ng/mL   Chest Portable 1 View  12/31/2011  *RADIOLOGY REPORT*  Clinical Data: Chest pain.  Nausea.  PORTABLE CHEST - 1 VIEW  Comparison: 12/14/2010  Findings: Artifact overlies chest.  Heart size is normal. Mediastinal shadows are normal.  Lungs are clear.  The vascularity is normal.  No effusions.  IMPRESSION: No active disease.   Original Report Authenticated By: Thomasenia Sales, M.D.       No results found.  Date: 12/31/2011  Rate: 60  Rhythm: normal sinus rhythm  QRS Axis: normal  Intervals: normal  ST/T Wave abnormalities: normal  Conduction Disutrbances:left bundle branch block  Narrative Interpretation:   Old EKG Reviewed: changes noted  CRITICAL CARE Performed by: Donnetta Hutching   Total critical care time: 45  Critical care time was exclusive of separately billable procedures and treating other patients.  Critical care was necessary to treat or prevent imminent or life-threatening deterioration.  Critical care was time spent personally by me on the following activities:  development of treatment plan with patient and/or surrogate as well as nursing, discussions with consultants, evaluation of patient's response to treatment, examination of patient, obtaining history from patient or surrogate, ordering and performing treatments and interventions, ordering and  review of laboratory studies, ordering and review of radiographic studies, pulse oximetry and re-evaluation of patient's condition.  MDM  Multiple risk factors and history suggest acute coronary syndrome.Maryclare Labrador start IV nitroglycerin, IV beta blocker, heparin. Aspirin and morphine given.   Discussed with Dr. Jens Som.  Transfer to [facility].        Donnetta Hutching, MD 12/31/11 [phone]

## 2011-12-31 NOTE — ED Notes (Signed)
Report given to Jesusita Oka RN with [facility]

## 2011-12-31 NOTE — Progress Notes (Signed)
ANTICOAGULATION CONSULT NOTE - Initial Consult  Pharmacy Consult for heparin Indication: chest pain/ACS  Allergies  Allergen Reactions  . Lisinopril Cough    Patient Measurements: Height: 5\' 9"  (175.3 cm) Weight: 240 lb 8.4 oz (109.1 kg) IBW/kg (Calculated) : 70.7  Heparin Dosing Weight: 83.5  Vital Signs: Temp: 97.4 F (36.3 C) (09/09 1537) Temp src: Oral (09/09 1537) BP: 142/84 mmHg (09/09 1537) Pulse Rate: 59  (09/09 1537)  Labs:  Basename 12/31/11 1725 12/31/11 1724 12/31/11 0804  HGB -- -- 14.4  HCT -- -- 41.8  PLT -- -- 159  APTT -- -- --  LABPROT 13.4 -- --  INR 1.00 -- --  HEPARINUNFRC <0.10* -- --  CREATININE 0.94 -- 0.90  CKTOTAL -- -- --  CKMB -- -- --  TROPONINI -- <0.30 <0.30    Estimated Creatinine Clearance: 114.5 ml/min (by C-G formula based on Cr of 0.94).   Medical History: Past Medical History  Diagnosis Date  . Coronary artery disease 2002    Lesion in LAD s/p angioplasty  . Hypertension   . Hyperlipidemia   . Myocardial infarct   . Tobacco abuse   . COPD (chronic obstructive pulmonary disease)     Medications:  Prescriptions prior to admission  Medication Sig Dispense Refill  . aspirin 81 MG tablet Take 81 mg by mouth daily.        Marland Kitchen ibuprofen (ADVIL,MOTRIN) 200 MG tablet Take 400 mg by mouth every 6 (six) hours as needed. For pain      . metoprolol (TOPROL-XL) 100 MG 24 hr tablet Take 1 tablet (100 mg total) by mouth daily.  30 tablet  11  . nitroGLYCERIN (NITROSTAT) 0.4 MG SL tablet Place 0.4 mg under the tongue every 5 (five) minutes as needed. For chest pain      . simvastatin (ZOCOR) 20 MG tablet Take 1 tablet (20 mg total) by mouth at bedtime.  30 tablet  1    Assessment: 51 yo man transferred from [facility] to continue heparin for CP.  He was given a bolus of 4000 units and drip at 1000 units/hr at [facility].  6 hr heparin level <0.1 units/ml Goal of Therapy:  Heparin level 0.3-0.7 units/ml Monitor platelets by anticoagulation  protocol: Yes   Plan:  Will rebolus with 3000 units and increase drip to 1400 units/hr. Check HL in 6 hours. Daily CBC and heparin level while on heparin. Monitor for s&s bleeding.  Talbert Cage Poteet 12/31/2011,6:39 PM

## 2011-12-31 NOTE — ED Notes (Signed)
Pt waiting for [facility] to transport pt

## 2011-12-31 NOTE — H&P (Addendum)
Patient ID: [name] MRN: [id number] DOB/AGE: [birth year] 50 y.o. Admit date: 12/31/2011  Primary Care Physician: Sanda Linger Primary Cardiologist: Juanito Doom  HPI: 51 yo WM with history of CAD with prior PTCA/stents, MI in 1999, HTN, Hyperlipidemia, tobacco abuse, COPD who is transferred from [facility] ED where he presented today with c/o chest pain. He was watching TV this am and began to have left sided chest pain around 6:45 am. This was associated with mild nausea. No SOB. The pain persisted and he sought medical attention. Pain resolved at [facility] ED with administration of NTG, morphine and heparin. He is now pain free upon arrival to [facility]. His breathing is normal. He had a history of CAD with MI in 1999 with 1 stent placed in the proximal LAD at that time and 2 stents placed in the mid LAD at that time (Dr. Riley Kill). Last cath in 2002 at which time he had balloon angioplasty of the severe in-stent restenosis in the mid LAD stent (Dr. Juanda Chance). He has continued to smoke 2ppd over the last 11 years and has had minimal cardiac follow-up with non-compliance with cardiac meds secondary to cost.   At this time, no complaints of chest pain. He denies any SOB, nausea, palpitations, near syncope or syncope. He states that he is hungry and wishes to have to something to eat now.   Review of systems complete and found to be negative unless listed above   Past Medical History  Diagnosis Date  . Coronary artery disease 2002    Lesion in LAD s/p angioplasty  . Hypertension   . Hyperlipidemia   . Myocardial infarct   . Tobacco abuse   . COPD (chronic obstructive pulmonary disease)     Family History  Problem Relation Age of Onset  . Cancer Neg Hx   . Heart disease Father     History   Social History  . Marital Status: Married    Spouse Name: N/A    Number of Children: 5  . Years of Education: N/A   Occupational History  . Works in [employer]    Social History Main Topics  .  Smoking status: Current Everyday Smoker -- 2.0 packs/day for 35 years    Types: Cigarettes  . Smokeless tobacco: Never Used  . Alcohol Use: No  . Drug Use: No  . Sexually Active: Yes   Other Topics Concern  . Not on file   Social History Narrative  . No narrative on file    Past Surgical History  Procedure Date  . Coronary angioplasty with stent placement 11/1997, 11/2000  . Knee surgery     left x 2    Allergies  Allergen Reactions  . Lisinopril Cough    Prior to Admission Meds:  Prescriptions prior to admission  Medication Sig Dispense Refill  . aspirin 81 MG tablet Take 81 mg by mouth daily.        Marland Kitchen ibuprofen (ADVIL,MOTRIN) 200 MG tablet Take 400 mg by mouth every 6 (six) hours as needed. For pain      . metoprolol (TOPROL-XL) 100 MG 24 hr tablet Take 1 tablet (100 mg total) by mouth daily.  30 tablet  11  . nitroGLYCERIN (NITROSTAT) 0.4 MG SL tablet Place 0.4 mg under the tongue every 5 (five) minutes as needed. For chest pain      . simvastatin (ZOCOR) 20 MG tablet Take 1 tablet (20 mg total) by mouth at bedtime.  30 tablet  1    Physical Exam: Blood pressure 142/84, pulse 59, temperature 97.4 F (36.3 C), temperature source Oral, resp. rate 16, height 5\' 9"  (1.753 m), weight 240 lb 8.4 oz (109.1 kg), SpO2 92.00%.  General: Well developed, well nourished, NAD  HEENT: OP clear, mucus membranes moist  SKIN: warm, dry. No rashes.  Neuro: No focal deficits  Musculoskeletal: Muscle strength 5/5 all ext  Psychiatric: Mood and affect normal  Neck: No JVD, no carotid bruits, no thyromegaly, no lymphadenopathy.  Lungs:Clear bilaterally, no wheezes, rhonci, crackles  Cardiovascular: Regular rate and rhythm. No murmurs, gallops or rubs.  Abdomen:Soft. Bowel sounds present. Non-tender.  Extremities: No lower extremity edema. Pulses are 2 + in the bilateral DP/PT.   Labs:   Lab Results  Component Value Date   WBC 7.1 12/31/2011   HGB 14.4 12/31/2011   HCT 41.8 12/31/2011    MCV 88.2 12/31/2011   PLT 159 12/31/2011    Lab 12/31/11 0804  NA 132*  K 4.1  CL 96  CO2 27  BUN 10  CREATININE 0.90  CALCIUM 9.1  PROT --  BILITOT --  ALKPHOS --  ALT --  AST --  GLUCOSE 120*   Lab Results  Component Value Date   TROPONINI <0.30 12/31/2011    Lipid Panel     Component Value Date/Time   CHOL 184 11/23/2010 0850   TRIG 257* 11/23/2010 0850   HDL 32* 11/23/2010 0850   CHOLHDL 5.8 11/23/2010 0850   VLDL 51* 11/23/2010 0850   LDLCALC 101* 11/23/2010 0850     Radiology: CXR: 12/31/11: No active disease  EKG: 9.09/13 at 16:20: sinus brady, rate 55 bpm. No injury current.   ASSESSMENT AND PLAN:   1. Chest pain consistent with unstable angina: He is currently chest pain free. First troponin was negative this am but has not been repeated. EKG does not show a pattern c/w injury. Admit to step-down ICU. Will continue IV heparin and IV Nitroglycerin. Will cycle cardiac enzymes. Continue beta blocker and restart statin. NPO at midnight for cardiac cath in am unless we are unable to control chest pain through the night. Will continue ASA for now. No thienopyridine at this time as he has a high likelihood of progression of multi-vessel CAD, with continued tobacco abuse and medical non-compliance, which may require CABG. Risks and benefits of cath reviewed with patient and he agrees to proceed.   2. History of CAD with prior stent placement: MI in 1999 with 1 stent placed in the proximal LAD at that time and 2 stents placed in the mid LAD at that time. Last cath in 2002 at which time he had balloon angioplasty of the severe in-stent restenosis in the mid LAD stent. See above.   3. Ongoing tobacco abuse: Smoking cessation is advised. He does not wish to stop smoking at this time.   4. HTN: Will follow and adjust medications.   5. Hyperlipidemia: Will restart statin.    [name] 12/31/2011, 5:00 PM

## 2011-12-31 NOTE — ED Notes (Signed)
Pt still c/o mid sternum cp. Pt moving back and forth in bed stating, " I just can't get comfortable" Dr. Adriana Simas in room and verbal orders were received for pain mgmt.

## 2011-12-31 NOTE — ED Notes (Signed)
Per EMS pt c/o CP @ 7am 10/10 non-radiating. VS P 68, 98%, BP: 168/102. Given 4 ASA 2 SL NTG upon arrival and metoprolol 100mg . Upon arrical pain is 3/10. Pt has hx. Of MI

## 2012-01-01 ENCOUNTER — Inpatient Hospital Stay (HOSPITAL_COMMUNITY): Payer: Medicaid Other

## 2012-01-01 ENCOUNTER — Other Ambulatory Visit: Payer: Self-pay | Admitting: *Deleted

## 2012-01-01 ENCOUNTER — Encounter (HOSPITAL_COMMUNITY)
Admission: EM | Disposition: A | Discharge status: Home / Self Care | Payer: Self-pay | Source: Home / Self Care | Attending: Cardiothoracic Surgery

## 2012-01-01 ENCOUNTER — Other Ambulatory Visit (HOSPITAL_COMMUNITY): Payer: Self-pay | Admitting: Radiology

## 2012-01-01 DIAGNOSIS — I2510 Atherosclerotic heart disease of native coronary artery without angina pectoris: Secondary | ICD-10-CM

## 2012-01-01 DIAGNOSIS — Z01810 Encounter for preprocedural cardiovascular examination: Secondary | ICD-10-CM

## 2012-01-01 HISTORY — PX: LEFT HEART CATHETERIZATION WITH CORONARY ANGIOGRAM: SHX5451

## 2012-01-01 LAB — TYPE AND SCREEN
ABO/RH(D): O POS
Antibody Screen: NEGATIVE

## 2012-01-01 LAB — TROPONIN I
Troponin I: <0.3 ng/mL (ref <0.30)
Troponin I: <0.3 ng/mL

## 2012-01-01 LAB — CBC
MCH: 30.6 pg (ref 26.0–34.0)
MCHC: 34.3 g/dL (ref 30.0–36.0)
MCV: 89.2 fL (ref 78.0–100.0)
Platelets: 156 10*3/uL (ref 150–400)
RDW: 13.4 % (ref 11.5–15.5)

## 2012-01-01 LAB — URINALYSIS, ROUTINE W REFLEX MICROSCOPIC
Bilirubin Urine: NEGATIVE
Hgb urine dipstick: NEGATIVE
Ketones, ur: NEGATIVE mg/dL
Specific Gravity, Urine: 1.01 (ref 1.005–1.030)
Urobilinogen, UA: 0.2 mg/dL (ref 0.0–1.0)

## 2012-01-01 LAB — BASIC METABOLIC PANEL
BUN: 10 mg/dL (ref 6–23)
CO2: 30 mEq/L (ref 19–32)
Calcium: 9.1 mg/dL (ref 8.4–10.5)
Creatinine, Ser: 1.01 mg/dL (ref 0.50–1.35)
GFR calc non Af Amer: 85 mL/min — ABNORMAL LOW (ref >90)
Glucose, Bld: 93 mg/dL (ref 70–99)
Sodium: 137 mEq/L (ref 135–145)

## 2012-01-01 LAB — LIPID PANEL: Cholesterol: 201 mg/dL — ABNORMAL HIGH (ref 0–200)

## 2012-01-01 LAB — HEPARIN LEVEL (UNFRACTIONATED): Heparin Unfractionated: <0.1 IU/mL — ABNORMAL LOW (ref 0.30–0.70)

## 2012-01-01 SURGERY — LEFT HEART CATHETERIZATION WITH CORONARY ANGIOGRAM
Anesthesia: LOCAL

## 2012-01-01 MED ORDER — HEPARIN (PORCINE) IN NACL 100-0.45 UNIT/ML-% IJ SOLN
2200.0000 [IU]/h | INTRAMUSCULAR | Status: DC
  Administered 2012-01-01: 1900 [IU]/h via INTRAVENOUS
  Administered 2012-01-02: 2200 [IU]/h via INTRAVENOUS
  Filled 2012-01-01 (×3): qty 250

## 2012-01-01 MED ORDER — TRANEXAMIC ACID 100 MG/ML IV SOLN
1.5000 mg/kg/h | INTRAVENOUS | Status: AC
  Administered 2012-01-02: 1.5 mg/kg/h via INTRAVENOUS
  Filled 2012-01-01 (×2): qty 25

## 2012-01-01 MED ORDER — DEXTROSE 5 % IV SOLN
1.5000 g | INTRAVENOUS | Status: AC
  Administered 2012-01-02: 1.5 g via INTRAVENOUS
  Administered 2012-01-02: 750 g via INTRAVENOUS
  Filled 2012-01-01: qty 1.5

## 2012-01-01 MED ORDER — TRANEXAMIC ACID (OHS) BOLUS VIA INFUSION
15.0000 mg/kg | INTRAVENOUS | Status: AC
  Administered 2012-01-02: 1636.5 mg via INTRAVENOUS
  Filled 2012-01-01: qty 1637

## 2012-01-01 MED ORDER — ONDANSETRON HCL 4 MG/2ML IJ SOLN: 4.0000 mg | Freq: Four times a day (QID) | INTRAMUSCULAR | Status: DC | PRN

## 2012-01-01 MED ORDER — VERAPAMIL HCL 2.5 MG/ML IV SOLN
INTRAVENOUS | Status: AC
  Filled 2012-01-01: qty 2

## 2012-01-01 MED ORDER — SODIUM CHLORIDE 0.9 % IV SOLN
INTRAVENOUS | Status: AC
  Administered 2012-01-01: 125 mL/h via INTRAVENOUS
  Administered 2012-01-01: 14:00:00 via INTRAVENOUS

## 2012-01-01 MED ORDER — TEMAZEPAM 15 MG PO CAPS
15.0000 mg | ORAL_CAPSULE | Freq: Once | ORAL | Status: AC | PRN
  Administered 2012-01-01: 15 mg via ORAL
  Filled 2012-01-01: qty 1

## 2012-01-01 MED ORDER — METOPROLOL TARTRATE 12.5 MG HALF TABLET
12.5000 mg | ORAL_TABLET | Freq: Once | ORAL | Status: AC
  Administered 2012-01-02: 12.5 mg via ORAL
  Filled 2012-01-01: qty 1

## 2012-01-01 MED ORDER — DEXMEDETOMIDINE HCL IN NACL 400 MCG/100ML IV SOLN
0.1000 ug/kg/h | INTRAVENOUS | Status: AC
  Administered 2012-01-02: 0.2 ug/kg/h via INTRAVENOUS
  Filled 2012-01-01: qty 100

## 2012-01-01 MED ORDER — ALBUTEROL SULFATE (5 MG/ML) 0.5% IN NEBU
2.5000 mg | INHALATION_SOLUTION | Freq: Once | RESPIRATORY_TRACT | Status: AC
  Administered 2012-01-01: 2.5 mg via RESPIRATORY_TRACT

## 2012-01-01 MED ORDER — HEPARIN SODIUM (PORCINE) 1000 UNIT/ML IJ SOLN
INTRAMUSCULAR | Status: AC
  Filled 2012-01-01: qty 1

## 2012-01-01 MED ORDER — MAGNESIUM SULFATE 50 % IJ SOLN
40.0000 meq | INTRAMUSCULAR | Status: DC
  Filled 2012-01-01: qty 10

## 2012-01-01 MED ORDER — EPINEPHRINE HCL 1 MG/ML IJ SOLN
0.5000 ug/min | INTRAVENOUS | Status: DC
  Filled 2012-01-01: qty 4

## 2012-01-01 MED ORDER — TRANEXAMIC ACID (OHS) PUMP PRIME SOLUTION
2.0000 mg/kg | INTRAVENOUS | Status: DC
  Filled 2012-01-01 (×2): qty 2.18

## 2012-01-01 MED ORDER — PHENYLEPHRINE HCL 10 MG/ML IJ SOLN
30.0000 ug/min | INTRAVENOUS | Status: AC
  Administered 2012-01-02: 10 ug/min via INTRAVENOUS
  Filled 2012-01-01 (×2): qty 2

## 2012-01-01 MED ORDER — VANCOMYCIN HCL 1000 MG IV SOLR
1500.0000 mg | INTRAVENOUS | Status: AC
  Administered 2012-01-02: 1000 mg via INTRAVENOUS
  Filled 2012-01-01: qty 1500

## 2012-01-01 MED ORDER — MIDAZOLAM HCL 2 MG/2ML IJ SOLN
INTRAMUSCULAR | Status: AC
  Filled 2012-01-01: qty 2

## 2012-01-01 MED ORDER — POTASSIUM CHLORIDE 2 MEQ/ML IV SOLN
80.0000 meq | INTRAVENOUS | Status: DC
  Filled 2012-01-01: qty 40

## 2012-01-01 MED ORDER — NITROGLYCERIN IN D5W 200-5 MCG/ML-% IV SOLN
2.0000 ug/min | INTRAVENOUS | Status: DC
  Filled 2012-01-01: qty 250

## 2012-01-01 MED ORDER — LIDOCAINE HCL (PF) 1 % IJ SOLN
INTRAMUSCULAR | Status: AC
  Filled 2012-01-01: qty 30

## 2012-01-01 MED ORDER — CHLORHEXIDINE GLUCONATE 4 % EX LIQD
60.0000 mL | Freq: Once | CUTANEOUS | Status: AC
  Administered 2012-01-01 – 2012-01-02 (×2): 4 via TOPICAL
  Filled 2012-01-01: qty 60

## 2012-01-01 MED ORDER — NITROGLYCERIN 0.2 MG/ML ON CALL CATH LAB
INTRAVENOUS | Status: AC
  Filled 2012-01-01: qty 1

## 2012-01-01 MED ORDER — SODIUM CHLORIDE 0.9 % IV SOLN
INTRAVENOUS | Status: AC
  Administered 2012-01-02: 1 [IU]/h via INTRAVENOUS
  Filled 2012-01-01 (×2): qty 1

## 2012-01-01 MED ORDER — FENTANYL CITRATE 0.05 MG/ML IJ SOLN
INTRAMUSCULAR | Status: AC
  Filled 2012-01-01: qty 2

## 2012-01-01 MED ORDER — BISACODYL 5 MG PO TBEC
5.0000 mg | DELAYED_RELEASE_TABLET | Freq: Once | ORAL | Status: AC
  Administered 2012-01-01: 5 mg via ORAL
  Filled 2012-01-01: qty 1

## 2012-01-01 MED ORDER — DOPAMINE-DEXTROSE 3.2-5 MG/ML-% IV SOLN
2.0000 ug/kg/min | INTRAVENOUS | Status: AC
  Administered 2012-01-02: 3 ug/kg/min via INTRAVENOUS
  Filled 2012-01-01: qty 250

## 2012-01-01 MED ORDER — DEXTROSE 5 % IV SOLN
750.0000 mg | INTRAVENOUS | Status: DC
  Filled 2012-01-01: qty 750

## 2012-01-01 MED ORDER — HEPARIN BOLUS VIA INFUSION
2000.0000 [IU] | Freq: Once | INTRAVENOUS | Status: AC
  Administered 2012-01-01: 2000 [IU] via INTRAVENOUS
  Filled 2012-01-01: qty 2000

## 2012-01-01 MED ORDER — SODIUM BICARBONATE 8.4 % IV SOLN
INTRAVENOUS | Status: AC
  Administered 2012-01-02: 09:00:00
  Filled 2012-01-01 (×2): qty 2.5

## 2012-01-01 MED ORDER — HEPARIN (PORCINE) IN NACL 2-0.9 UNIT/ML-% IJ SOLN
INTRAMUSCULAR | Status: AC
  Filled 2012-01-01: qty 2000

## 2012-01-01 NOTE — Consult Note (Signed)
[address]            [name] [address]          [phone]     PCP is Sanda Linger, MD Referring Provider is Dr Riley Kill, MD  Chief Complaint  Patient presents with  . Chest Pain    History of Presenting Illness: This is 51 yo Caucasian male with history of CAD (s/p PTCA with a stent to the proximal LAD and 2 stents to the mid LAD, MI in 1999), hypertension, hyperlipidemia, tobacco abuse, and COPD who is transferred from [facility] ED where he presented yesterday with complaints of  chest pain. Apparently, he was watching TV 12/31/2011 and began to have left sided chest pain around 6:45 am. This was associated with mild nausea. He denied any shortness of breath. The chest pain persisted and he presented to [facility] Emergency Room for further evaluation and treatment.  His chest pain resolved at [facility] ED with administration of NTG, morphine and heparin. He was then transferred to [facility] for cardiac catheterization and further management.He has been chest pain free since arriving here.His last cardiac catheterization was done in 2002. At that time, he had balloon angioplasty of a severe in-stent restenosis in the mid LAD stent ( by Dr. Juanda Chance). He has continued to smoke two packs per day over the last 13 years and has had minimal cardiac follow-up with non-compliance with cardiac meds secondary to financial reason. A cardiac catheterization was done by Dr. Riley Kill this morning. He was found to have a LVEF of 55-65% and multivessel CAD. A cardiothoracic consultation has been obtained  for the consideration of coronary artery bypass grafting surgery.At this time, he has no complaints of chest pain. He denies any shortness of breath, nausea, palpitations, near syncope or syncope. He has not been given Plavix. He is on a heparin gttp.   Past Medical History  Diagnosis Date  . Coronary artery disease 2002    Lesion in LAD s/p angioplasty  .  Hypertension   . Hyperlipidemia   . Myocardial infarct   . Tobacco abuse   . COPD (chronic obstructive pulmonary disease)     Past Surgical History  Procedure Date  . Coronary angioplasty with stent placement 11/1997, 11/2000  . Knee surgery     left x 2   Patient works part time in [employer], turned down for disability Daughter 50 yo birthday tomorrow, one son is 26  Family History  Problem Mother is alive at age 20. She has a history of hypertension, stroke, and hyperlipidemia Father is alive and may have a history of heart disease      Social History History  Substance Use Topics  . Smoking status: Current Everyday Smoker -- 2.0 packs/day for 35 years    Types: Cigarettes  . Smokeless tobacco: Never Used  . Alcohol Use: Quit drinking    Current Facility-Administered Medications  Medication Dose Route Frequency Provider Last Rate Last Dose  . 0.9 %  sodium chloride infusion   Intravenous Continuous Herby Abraham, MD 125 mL/hr at 01/01/12 1039 125 mL/hr at 01/01/12 1039  . acetaminophen (TYLENOL) tablet 650 mg  650 mg Oral Q4H PRN Kathleene Hazel, MD      . aspirin chewable tablet 324 mg  324 mg Oral Pre-Cath Kathleene Hazel, MD   324 mg at 01/01/12  4098  . aspirin EC tablet 81 mg  81 mg Oral Daily Kathleene Hazel, MD      . atorvastatin (LIPITOR) tablet 40 mg  40 mg Oral q1800 Kathleene Hazel, MD   40 mg at 12/31/11 1846  . diazepam (VALIUM) tablet 5 mg  5 mg Oral On Call Kathleene Hazel, MD   5 mg at 01/01/12 0751  . fentaNYL (SUBLIMAZE) 0.05 MG/ML injection           . heparin 1000 UNIT/ML injection           . heparin 2-0.9 UNIT/ML-% infusion           . heparin ADULT infusion 100 units/mL (25000 units/250 mL)  1,900 Units/hr Intravenous Continuous Bolanle A Lawuyi, PHARMD      . heparin bolus via infusion 2,000 Units  2,000 Units Intravenous Once Lewayne Bunting, MD   2,000 Units at 01/01/12 0538  . heparin bolus via  infusion 3,000 Units  3,000 Units Intravenous Once Lewayne Bunting, MD   3,000 Units at 12/31/11 1847  . lidocaine (XYLOCAINE) 1 % injection           . LORazepam (ATIVAN) 2 MG/ML injection           . metoprolol tartrate (LOPRESSOR) tablet 25 mg  25 mg Oral BID Kathleene Hazel, MD   25 mg at 12/31/11 2228  . midazolam (VERSED) 2 MG/2ML injection           . morphine 2 MG/ML injection           . morphine 4 MG/ML injection           . nitroGLYCERIN (NITROGLYN) 2 % ointment           . nitroGLYCERIN (NITROSTAT) SL tablet 0.4 mg  0.4 mg Sublingual Q5 min PRN Donnetta Hutching, MD   0.4 mg at 12/31/11 0804  . nitroGLYCERIN (NTG ON-CALL) 0.2 mg/mL injection           . nitroGLYCERIN 0.2 mg/mL in dextrose 5 % infusion  5 mcg/min Intravenous Titrated Donnetta Hutching, MD   5 mcg/min at 12/31/11 1033  . nitroGLYCERIN 0.2 mg/mL in dextrose 5 % infusion  3-30 mcg/min Intravenous Titrated Kathleene Hazel, MD      . ondansetron [facility]) 4 MG/2ML injection           . ondansetron (ZOFRAN) injection 4 mg  4 mg Intravenous Q6H PRN Herby Abraham, MD      . verapamil (ISOPTIN) 2.5 MG/ML injection           . DISCONTD: 0.9 %  sodium chloride infusion  250 mL Intravenous PRN Kathleene Hazel, MD      . DISCONTD: 0.9 %  sodium chloride infusion   Intravenous Continuous Kathleene Hazel, MD 75 mL/hr at 01/01/12 0353    . DISCONTD: heparin ADULT infusion 100 units/mL (25000 units/250 mL)  1,000 Units/hr Intravenous Continuous Donnetta Hutching, MD 10 mL/hr at 12/31/11 1034 1,000 Units/hr at 12/31/11 1034  . DISCONTD: heparin ADULT infusion 100 units/mL (25000 units/250 mL)  1,850 Units/hr Intravenous Continuous Lewayne Bunting, MD 18.5 mL/hr at 01/01/12 0537 1,850 Units/hr at 01/01/12 0537  . DISCONTD: sodium chloride 0.9 % injection 3 mL  3 mL Intravenous Q12H Kathleene Hazel, MD   3 mL at 12/31/11 2228  . DISCONTD: sodium chloride 0.9 % injection 3 mL  3 mL Intravenous PRN Kathleene Hazel,  MD  Allergies  Allergen Reactions  . Lisinopril Cough    Review of Systems: Cardiac Review of Systems: Y or N  Chest Pain [ y] Resting SOB [ n ] Exertional SOB [y] Orthopnea [ n ]  Pedal Edema [ n ] Palpitations [ n ] Syncope [ n ] Presyncope [ n ]  General Review of Systems: [Y] = yes [ ] =no  Constitional: recent weight change [ n]; anorexia [ n]; fatigue Cove.Etienne ]; nausea [ n]; night sweats [ n]; fever [ n]; or chills [n ];  Eye : blurred vision [n ]; diplopia Milo.Brash ]; vision changes [n ]; Amaurosis fugax[ n];  Resp: cough [ n ]; wheezing[ n ]; hemoptysis[ n ]; shortness of breath[ n ]; paroxysmal nocturnal dyspnea[ n ]; dyspnea on exertion[n]; or orthopnea[ n ];  GI: gallstones[ n ], vomiting[n ]; dysphagia[ n]; melena[n ]; hematochezia [ n]; heartburn[n ];   GU: kidney stones [ n]; hematuria[n ]; dysuria [ n]; nocturia[n ]; history of obstruction [ ] ;  Skin: rash, swelling[ n];, hair loss[ n]; peripheral edema[n ]; or itching[ n];  Musculosketetal: myalgias[n ]; joint swelling[n]; joint erythema[ n];  Heme/Lymph: bruising[ ] ; bleeding[n ]; anemia[ ] ;  Neuro: TIA[ n ]; headaches[n ]; stroke[n ] ; vertigo[n ]; seizures[ n]; paresthesias[ n]; difficulty walking[n ];  Psych:depression[n ]; anxiety[n ];  Endocrine: diabetes[n ]; thyroid dysfunction[ ] ;    BP 128/74  Pulse 70  Temp 98 F (36.7 C) (Oral)  Resp 20  Ht 5\' 9"  (1.753 m)  Wt 240 lb 8.4 oz (109.1 kg)  BMI 35.52 kg/m2  SpO2 93%  Physical Exam: General: Well developed, well nourished, AAOx3 and in NAD HEENT: Atraumatic, normocephalic;Eyes:EOMI and PERRLA;Neck:no JVD, no lymphadenopathy, no carotid bruits SKIN: Warm, dry, and no rashes.  Lungs:Clear to auscultation bilaterally; no wheezes, rhonci, crackles  Cardiovascular: Regular rate and rhythm. No murmurs, gallops, or rubs.  Abdomen:Soft, non-tender, and  bowel sounds present Extremities: No lower extremity edema. Pulses are 2 +   bilateral DP/PT.Marland Kitchen Radial on left is  supplier of palmer arch Musculoskeletal: Muscle strength 5/5 all extremeties  Neuro: Cranial nerves II-XII are intact without any focal deficits   Diagnostic Tests: Coronary angiography: done by Dr. Riley Kill 01/01/2012: Coronary dominance: left  Left mainstem: Calcified, short and without critical disease  Left anterior descending (LAD): Heavily calcified with previously placed Penta and Radius stents. There is diffuse non obstructive plaque proximally in a segmentally diseased and heavily calcified artery. The major first diagonal is hypodense proximally with probably 60% narrowing. The mid artery is stented with a 95% in stent restenosis. There is about 50% narrowing before the D2 vessel. The distal LAD wraps the apex and appears to be suitable for grafting  Left circumflex (LCx): The vessel is dominant providing the lateral and inferior segments. There is calcified segmental ostial 80% narrowing in the main vessel that overlaps the origin of a small, diffusely diseased ramus. There is then a large OM that is suitable for grafting. There is then 70% just after the OM in the main vessel. This leads to a large PLA with 50% segmental proximal disease that is suitable for grafting. The inferior segment is supplied by three small branches that are diffusely diseased.  Right coronary artery (RCA): totally occluded at the junction of the proximal and mid vessel. Collaterals are not visualized  Left ventriculography: Left ventricular systolic function is normal, LVEF is estimated at 55-65%, there is no significant mitral regurgitation. There may be mild hypokinesis in  the mid anterolateral wall.  Final Conclusions:  1. Preserved overall LV function with severe three vessel disease with total occlusion of the RCA, high grade ostial dominant circ and ISR of the LAD.   Lab Results  Component Value Date   TROPONINI <0.30 01/01/2012   Lab Results  Component Value Date   WBC 6.6 01/01/2012   HGB 13.9 01/01/2012    HCT 40.5 01/01/2012   PLT 156 01/01/2012   GLUCOSE 93 01/01/2012   CHOL 201* 01/01/2012   TRIG 644* 01/01/2012   HDL 22* 01/01/2012   LDLDIRECT 124.8 06/28/2010   LDLCALC UNABLE TO CALCULATE IF TRIGLYCERIDE OVER 400 mg/dL 2/95/6213   ALT 21 0/11/6576   AST 19 12/31/2011   NA 137 01/01/2012   K 4.0 01/01/2012   CL 98 01/01/2012   CREATININE 1.01 01/01/2012   BUN 10 01/01/2012   CO2 30 01/01/2012   TSH 2.35 04/14/2010   PSA 2.01 04/14/2010   INR 1.00 12/31/2011   HGBA1C 5.1 12/31/2008    Impression and Plan: 1.History of CAD (s/p PTCA with multiple stents to the LAD, s/p MI 02').Consideration for CABG in am. With 3 vessel disease symptoms and critical restenosis of stented vessels.  2.History of hypertension 3.History of hyperlipidemia, un treated 4.History of tobacco abuse- discussed with patient need to stop including means to stop  I have recommended CABG as best treatment option. Will not use radials (cath in rt,  left radial supplier of palmer arch)  The goals risks and alternatives of the planned surgical procedure CABG  have been discussed with the patient in detail. The risks of the procedure including death, infection, stroke, myocardial infarction, bleeding, blood transfusion have all been discussed specifically.  I have quoted [name] a 4 % of perioperative mortality and a complication rate as high as 25%. The patient's questions have been answered.[name] is willing  to proceed with the planned procedure.   Delight Ovens MD  Beeper [phone] Office [phone] 01/01/2012 3:17 PM

## 2012-01-01 NOTE — CV Procedure (Signed)
   Cardiac Catheterization Procedure Note  Name: [name] MRN: [id number] DOB: 1960-11-06  Procedure: Left Heart Cath, Selective Coronary Angiography, LV angiography  Indication: unstable angina   Procedural Details: The right wrist was prepped, draped, and anesthetized with 1% lidocaine. Using the modified Seldinger technique, a 5 French sheath was introduced into the right radial artery. 3 mg of verapamil was administered through the sheath, weight-based unfractionated heparin was administered intravenously. Standard Judkins catheters were used for selective coronary angiography and left ventriculography. Catheter exchanges were performed over an exchange length guidewire. There were no immediate procedural complications. A TR band was used for radial hemostasis at the completion of the procedure.  The patient was transferred to the post catheterization recovery area for further monitoring.  Procedural Findings: Hemodynamics: AO 134/80 (103) LV 135/14 5mm pullback gradient noted.     Coronary angiography: Coronary dominance: left  Left mainstem: Calcified, short and without critical disease  Left anterior descending (LAD): Heavily calcified with previously placed Penta and Radius stents.  There is diffuse non obstructive plaque proximally in a segmentally diseased and heavily calcified artery.  The major first diagonal is hypodense proximally with probably 60% narrowing.  The mid artery is stented with a 95% in stent restenosis.  There is about 50% narrowing before the D2 vessel.  The distal LAD wraps the apex and appears to be suitable for grafting  Left circumflex (LCx): The vessel is dominant providing the lateral and inferior segments.  There is calcified segmental ostial 80% narrowing in the main vessel that overlaps the origin of a small, diffusely diseased ramus.  There is then a large OM that is suitable for grafting.  There is then 70% just after the OM in the main vessel.   This leads to a large PLA with 50% segmental proximal disease that is suitable for grafting.  The inferior segment is supplied by three small branches that are diffusely diseased.     Right coronary artery (RCA): totally occluded at the junction of the proximal and mid vessel.  Collaterals are not visualized  Left ventriculography: Left ventricular systolic function is normal, LVEF is estimated at 55-65%, there is no significant mitral regurgitation.  There may be mild hypokinesis in the mid anterolateral wall.    Final Conclusions:   1.  Preserved overall LV function with severe three vessel disease with total occlusion of the RCA, high grade ostial dominant circ and ISR of the LAD.   Recommendations:  1.  TCTS consult---called at 9am.  2.  Restart heparin.  [name] MD, [facility], FSCAI 01/01/2012, 9:14 AM

## 2012-01-01 NOTE — Progress Notes (Signed)
ANTICOAGULATION CONSULT NOTE Pharmacy Consult for heparin Indication: chest pain/ACS  Allergies  Allergen Reactions  . Lisinopril Cough    Patient Measurements: Height: 5\' 9"  (175.3 cm) Weight: 240 lb 8.4 oz (109.1 kg) IBW/kg (Calculated) : 70.7  Heparin Dosing Weight: 83.5  Vital Signs: Temp: 98.5 F (36.9 C) (09/10 0400) Temp src: Oral (09/10 0400) BP: 138/76 mmHg (09/10 0400) Pulse Rate: 69  (09/10 0400)  Labs:  Basename 01/01/12 0430 12/31/11 2238 12/31/11 1725 12/31/11 1724 12/31/11 0804  HGB 13.9 -- -- -- 14.4  HCT 40.5 -- -- -- 41.8  PLT 156 -- -- -- 159  APTT -- -- -- -- --  LABPROT -- -- 13.4 -- --  INR -- -- 1.00 -- --  HEPARINUNFRC <0.10* -- <0.10* -- --  CREATININE -- -- 0.94 -- 0.90  CKTOTAL -- -- -- -- --  CKMB -- -- -- -- --  TROPONINI -- <0.30 -- <0.30 <0.30    Estimated Creatinine Clearance: 114.5 ml/min (by C-G formula based on Cr of 0.94).  Assessment: 51 yo male with chest pain for Heparin  Goal of Therapy:  Heparin level 0.3-0.7 units/ml Monitor platelets by anticoagulation protocol: Yes   Plan:  Heparin 2000 units IV bolus, then increase heparin 1850 units/hr F/U after cath  Eddie Candle 01/01/2012,5:23 AM

## 2012-01-01 NOTE — Progress Notes (Addendum)
VASCULAR LAB PRELIMINARY  PRELIMINARY  PRELIMINARY  PRELIMINARY  Pre-op Cardiac Surgery  Carotid Findings:  Right:  No evidence of hemodynamically significant internal carotid artery stenosis.  Left:  40-59% internal carotid artery stenosis.  Bilateral:  Vertebral artery flow is antegrade.    Upper Extremity Right Left  Brachial Pressures  139 T  Radial Waveforms  T  Ulnar Waveforms  T  Palmar Arch (Allen's Test) * **   Findings:  *Right:  Not obtained due to cardiac cath in right radial artery.  **Left:  Doppler waveforms obliterate with radial and remain normal with ulnar compressions.    Lower  Extremity Right Left  Dorsalis Pedis    Anterior Tibial 82 DM 124 T  Posterior Tibial 138 T 138 T  Ankle/Brachial Indices 0.99 0.99    Findings:  ABI is within normal limits bilaterally with abnormal Doppler waveforms noted in the right anterior tibial artery.   BIGGS, [name], 01/01/2012, 1:07 PM  [name] , RVT 01/01/2012 2:47 PM

## 2012-01-01 NOTE — Interval H&P Note (Signed)
History and Physical Interval Note:  01/01/2012 8:20 AM  [name]  has presented today for surgery, with the diagnosis of chest pain  The various methods of treatment have been discussed with the patient. After consideration of risks, benefits and other options for treatment, the patient has consented to  Procedure(s) (LRB) with comments: LEFT HEART CATHETERIZATION WITH CORONARY ANGIOGRAM (N/A) as a surgical intervention .  The patient's history has been reviewed, patient examined, no change in status, stable for surgery.  I have reviewed the patient's chart and labs.  Questions were answered to the patient's satisfaction.     [name]  Patient seen and examined.  Well known to me.  Will plan repeat cath and he understands need for procedure.  Risks discussed.  Patient agreeable.

## 2012-01-01 NOTE — Progress Notes (Signed)
Patient refuses to use the urinal. He went to the bathroom with his wife. Told patient he is not allowed to use his right arm bc of the radial cath that was performed.  Patient is not compliant with instructions but will try to encourage patient to follow instructions. Will continue to monitor.  Samyia Motter, Charlaine Dalton RN

## 2012-01-01 NOTE — Progress Notes (Signed)
  SUBJECTIVE: No recurrent chest pain overnight. No SOB.   BP 138/76  Pulse 69  Temp 98.5 F (36.9 C) (Oral)  Resp 20  Ht 5' 9" (1.753 m)  Wt 240 lb 8.4 oz (109.1 kg)  BMI 35.52 kg/m2  SpO2 96%  Intake/Output Summary (Last 24 hours) at 01/01/12 0723 Last data filed at 01/01/12 0600  Gross per 24 hour  Intake 1112.82 ml  Output      0 ml  Net 1112.82 ml    PHYSICAL EXAM General: Well developed, well nourished, in no acute distress. Alert and oriented x 3.  Psych:  Good affect, responds appropriately Neck: No JVD. No masses noted.  Lungs: Clear bilaterally with no wheezes or rhonci noted.  Heart: RRR with no murmurs noted. Abdomen: Bowel sounds are present. Soft, non-tender.  Extremities: No lower extremity edema.   LABS: Basic Metabolic Panel:  Basename 01/01/12 0430 12/31/11 1725  NA 137 133*  K 4.0 4.0  CL 98 95*  CO2 30 30  GLUCOSE 93 102*  BUN 10 9  CREATININE 1.01 0.94  CALCIUM 9.1 9.3  MG -- 2.3  PHOS -- --   CBC:  Basename 01/01/12 0430 12/31/11 0804  WBC 6.6 7.1  NEUTROABS -- --  HGB 13.9 14.4  HCT 40.5 41.8  MCV 89.2 88.2  PLT 156 159   Cardiac Enzymes:  Basename 01/01/12 0430 12/31/11 2238 12/31/11 1724  CKTOTAL -- -- --  CKMB -- -- --  CKMBINDEX -- -- --  TROPONINI <0.30 <0.30 <0.30   Fasting Lipid Panel:  Basename 01/01/12 0430  CHOL 201*  HDL 22*  LDLCALC UNABLE TO CALCULATE IF TRIGLYCERIDE OVER 400 mg/dL  TRIG 644*  CHOLHDL 9.1  LDLDIRECT --    Current Meds:    . aspirin  324 mg Oral Pre-Cath  . aspirin EC  81 mg Oral Daily  . atorvastatin  40 mg Oral q1800  . diazepam  5 mg Oral On Call  . heparin  2,000 Units Intravenous Once  . heparin  3,000 Units Intravenous Once  . heparin  4,000 Units Intravenous Once  . LORazepam      . LORazepam  1 mg Intravenous Once  . metoprolol  5 mg Intravenous Once  . metoprolol tartrate  25 mg Oral BID  . morphine  2 mg Intravenous Once  . morphine  4 mg Intravenous Once  .  morphine      .  morphine injection  4 mg Intravenous Once  . morphine      . nitroGLYCERIN  1 inch Topical Once  . nitroGLYCERIN      . ondansetron      . ondansetron  4 mg Intravenous Once  . sodium chloride  3 mL Intravenous Q12H     ASSESSMENT AND PLAN:  1. Chest pain consistent with unstable angina: He is currently chest pain free. Cardiac markers are negative.  Will continue IV heparin and IV Nitroglycerin.  Continue ASA, beta blocker and statin. NPO for cardiac cath this am. No thienopyridine at this time as he has a high likelihood of progression of multi-vessel CAD, with continued tobacco abuse and medical non-compliance, which may require CABG. Risks and benefits of cath reviewed with patient and he agrees to proceed.   2. History of CAD with prior stent placement: MI in 1999 with 1 stent placed in the proximal LAD at that time and 2 stents placed in the mid LAD at that time. Last cath in 2002   at which time he had balloon angioplasty of the severe in-stent restenosis in the mid LAD stent. See above.   3. Ongoing tobacco abuse: Smoking cessation is advised. He does not wish to stop smoking at this time.   4. HTN: Controlled. Will follow and adjust medications.   5. Hyperlipidemia: Will restart statin.     MCALHANY,CHRISTOPHER  9/10/20137:23 AM  

## 2012-01-01 NOTE — H&P (View-Only) (Signed)
SUBJECTIVE: No recurrent chest pain overnight. No SOB.   BP 138/76  Pulse 69  Temp 98.5 F (36.9 C) (Oral)  Resp 20  Ht 5\' 9"  (1.753 m)  Wt 240 lb 8.4 oz (109.1 kg)  BMI 35.52 kg/m2  SpO2 96%  Intake/Output Summary (Last 24 hours) at 01/01/12 0723 Last data filed at 01/01/12 0600  Gross per 24 hour  Intake 1112.82 ml  Output      0 ml  Net 1112.82 ml    PHYSICAL EXAM General: Well developed, well nourished, in no acute distress. Alert and oriented x 3.  Psych:  Good affect, responds appropriately Neck: No JVD. No masses noted.  Lungs: Clear bilaterally with no wheezes or rhonci noted.  Heart: RRR with no murmurs noted. Abdomen: Bowel sounds are present. Soft, non-tender.  Extremities: No lower extremity edema.   LABS: Basic Metabolic Panel:  Basename 01/01/12 0430 12/31/11 1725  NA 137 133*  K 4.0 4.0  CL 98 95*  CO2 30 30  GLUCOSE 93 102*  BUN 10 9  CREATININE 1.01 0.94  CALCIUM 9.1 9.3  MG -- 2.3  PHOS -- --   CBC:  Basename 01/01/12 0430 12/31/11 0804  WBC 6.6 7.1  NEUTROABS -- --  HGB 13.9 14.4  HCT 40.5 41.8  MCV 89.2 88.2  PLT 156 159   Cardiac Enzymes:  Basename 01/01/12 0430 12/31/11 2238 12/31/11 1724  CKTOTAL -- -- --  CKMB -- -- --  CKMBINDEX -- -- --  TROPONINI <0.30 <0.30 <0.30   Fasting Lipid Panel:  Basename 01/01/12 0430  CHOL 201*  HDL 22*  LDLCALC UNABLE TO CALCULATE IF TRIGLYCERIDE OVER 400 mg/dL  TRIG 161*  CHOLHDL 9.1  LDLDIRECT --    Current Meds:    . aspirin  324 mg Oral Pre-Cath  . aspirin EC  81 mg Oral Daily  . atorvastatin  40 mg Oral q1800  . diazepam  5 mg Oral On Call  . heparin  2,000 Units Intravenous Once  . heparin  3,000 Units Intravenous Once  . heparin  4,000 Units Intravenous Once  . LORazepam      . LORazepam  1 mg Intravenous Once  . metoprolol  5 mg Intravenous Once  . metoprolol tartrate  25 mg Oral BID  . morphine  2 mg Intravenous Once  . morphine  4 mg Intravenous Once  .  morphine      .  morphine injection  4 mg Intravenous Once  . morphine      . nitroGLYCERIN  1 inch Topical Once  . nitroGLYCERIN      . ondansetron      . ondansetron  4 mg Intravenous Once  . sodium chloride  3 mL Intravenous Q12H     ASSESSMENT AND PLAN:  1. Chest pain consistent with unstable angina: He is currently chest pain free. Cardiac markers are negative.  Will continue IV heparin and IV Nitroglycerin.  Continue ASA, beta blocker and statin. NPO for cardiac cath this am. No thienopyridine at this time as he has a high likelihood of progression of multi-vessel CAD, with continued tobacco abuse and medical non-compliance, which may require CABG. Risks and benefits of cath reviewed with patient and he agrees to proceed.   2. History of CAD with prior stent placement: MI in 1999 with 1 stent placed in the proximal LAD at that time and 2 stents placed in the mid LAD at that time. Last cath in 2002  at which time he had balloon angioplasty of the severe in-stent restenosis in the mid LAD stent. See above.   3. Ongoing tobacco abuse: Smoking cessation is advised. He does not wish to stop smoking at this time.   4. HTN: Controlled. Will follow and adjust medications.   5. Hyperlipidemia: Will restart statin.     [name]  9/10/20137:23 AM

## 2012-01-01 NOTE — Progress Notes (Signed)
ANTICOAGULATION CONSULT NOTE - Follow Up Consult  Pharmacy Consult for heparin Indication: severe 3 vessel dx/ s/p cath  Allergies  Allergen Reactions  . Lisinopril Cough    Patient Measurements: Height: 5\' 9"  (175.3 cm) Weight: 240 lb 8.4 oz (109.1 kg) IBW/kg (Calculated) : 70.7    Vital Signs: Temp: 98.5 F (36.9 C) (09/10 0400) Temp src: Oral (09/10 0400) BP: 128/85 mmHg (09/10 1100) Pulse Rate: 72  (09/10 1100)  Labs:  Basename 01/01/12 0430 12/31/11 2238 12/31/11 1725 12/31/11 1724 12/31/11 0804  HGB 13.9 -- -- -- 14.4  HCT 40.5 -- -- -- 41.8  PLT 156 -- -- -- 159  APTT -- -- -- -- --  LABPROT -- -- 13.4 -- --  INR -- -- 1.00 -- --  HEPARINUNFRC <0.10* -- <0.10* -- --  CREATININE 1.01 -- 0.94 -- 0.90  CKTOTAL -- -- -- -- --  CKMB -- -- -- -- --  TROPONINI <0.30 <0.30 -- <0.30 --    Estimated Creatinine Clearance: 106.6 ml/min (by C-G formula based on Cr of 1.01).   Medications:  Scheduled:    . aspirin  324 mg Oral Pre-Cath  . aspirin EC  81 mg Oral Daily  . atorvastatin  40 mg Oral q1800  . diazepam  5 mg Oral On Call  . fentaNYL      . heparin      . heparin      . heparin  2,000 Units Intravenous Once  . heparin  3,000 Units Intravenous Once  . lidocaine      . LORazepam      . metoprolol tartrate  25 mg Oral BID  . midazolam      . morphine      . morphine      . nitroGLYCERIN      . nitroGLYCERIN      . ondansetron      . verapamil      . DISCONTD: sodium chloride  3 mL Intravenous Q12H    Assessment: 51 yo male transferred from [facility] to continue heparin for CP consistent with unstable angina.Now s/p cath (9/10) with severe 3 vessel disease with total occlusion of RCA and plan for CABG. Rx to restart heparin 8 hour post sheath with hep level 0.3-0.5 per MD. Last heparin level undetectable on 1850 units/hour.  Goal of Therapy:  Heparin level 0.3-0.5   Plan:  restart heparin at 5:00 pm at 1900 units/hour 6 hour heparin level Goal of  Therapy:  Heparin level 0.3-0.5 units/ml Monitor platelets by anticoagulation protocol: Yes  Bola A. Wandra Feinstein D Clinical Pharmacist [phone] Phone [phone] 01/01/2012 11:17 AM

## 2012-01-02 ENCOUNTER — Inpatient Hospital Stay (HOSPITAL_COMMUNITY): Payer: Medicaid Other

## 2012-01-02 ENCOUNTER — Encounter (HOSPITAL_COMMUNITY): Payer: Self-pay | Admitting: Anesthesiology

## 2012-01-02 ENCOUNTER — Encounter (HOSPITAL_COMMUNITY)
Admission: EM | Disposition: A | Discharge status: Home / Self Care | Payer: Self-pay | Source: Home / Self Care | Attending: Cardiothoracic Surgery

## 2012-01-02 ENCOUNTER — Inpatient Hospital Stay (HOSPITAL_COMMUNITY): Payer: Medicaid Other | Admitting: Anesthesiology

## 2012-01-02 DIAGNOSIS — I2510 Atherosclerotic heart disease of native coronary artery without angina pectoris: Secondary | ICD-10-CM

## 2012-01-02 HISTORY — PX: CORONARY ARTERY BYPASS GRAFT: SHX141

## 2012-01-02 HISTORY — PX: TEE WITHOUT CARDIOVERSION: SHX5443

## 2012-01-02 LAB — BLOOD GAS, ARTERIAL
Acid-Base Excess: 2.4 mmol/L — ABNORMAL HIGH (ref 0.0–2.0)
Bicarbonate: 26.7 mEq/L — ABNORMAL HIGH (ref 20.0–24.0)
Drawn by: 331761
FIO2: 0.21 %
O2 Saturation: 95 %
Patient temperature: 98.6
TCO2: 28 mmol/L (ref 0–100)
pCO2 arterial: 43.1 mmHg (ref 35.0–45.0)
pH, Arterial: 7.409 (ref 7.350–7.450)
pO2, Arterial: 74.9 mmHg — ABNORMAL LOW (ref 80.0–100.0)

## 2012-01-02 LAB — POCT I-STAT 4, (NA,K, GLUC, HGB,HCT)
Glucose, Bld: 88 mg/dL (ref 70–99)
Glucose, Bld: 98 mg/dL (ref 70–99)
HCT: 39 % (ref 39.0–52.0)
HCT: 34 % — ABNORMAL LOW (ref 39.0–52.0)
Hemoglobin: 13.6 g/dL (ref 13.0–17.0)
Hemoglobin: 11.2 g/dL — ABNORMAL LOW (ref 13.0–17.0)
Potassium: 4.2 mEq/L (ref 3.5–5.1)
Potassium: 5.6 mEq/L — ABNORMAL HIGH (ref 3.5–5.1)
Sodium: 137 mEq/L (ref 135–145)
Sodium: 133 mEq/L — ABNORMAL LOW (ref 135–145)

## 2012-01-02 LAB — POCT I-STAT 3, ART BLOOD GAS (G3+)
Bicarbonate: 26.9 mEq/L — ABNORMAL HIGH (ref 20.0–24.0)
Bicarbonate: 25.3 mEq/L — ABNORMAL HIGH (ref 20.0–24.0)
O2 Saturation: 100 %
O2 Saturation: 95 %
O2 Saturation: 96 %
O2 Saturation: 91 %
O2 Saturation: 92 %
TCO2: 29 mmol/L (ref 0–100)
TCO2: 28 mmol/L (ref 0–100)
TCO2: 27 mmol/L (ref 0–100)
pCO2 arterial: 50.9 mmHg — ABNORMAL HIGH (ref 35.0–45.0)
pCO2 arterial: 46.8 mmHg — ABNORMAL HIGH (ref 35.0–45.0)
pCO2 arterial: 42.2 mmHg (ref 35.0–45.0)
pCO2 arterial: 43.6 mmHg (ref 35.0–45.0)
pCO2 arterial: 43.9 mmHg (ref 35.0–45.0)
pH, Arterial: 7.359 (ref 7.350–7.450)
pH, Arterial: 7.337 — ABNORMAL LOW (ref 7.350–7.450)
pO2, Arterial: 79 mmHg — ABNORMAL LOW (ref 80.0–100.0)
pO2, Arterial: 62 mmHg — ABNORMAL LOW (ref 80.0–100.0)
pO2, Arterial: 67 mmHg — ABNORMAL LOW (ref 80.0–100.0)

## 2012-01-02 LAB — CBC
HCT: 36.5 % — ABNORMAL LOW (ref 39.0–52.0)
HCT: 37.8 % — ABNORMAL LOW (ref 39.0–52.0)
Hemoglobin: 12.7 g/dL — ABNORMAL LOW (ref 13.0–17.0)
Hemoglobin: 12.7 g/dL — ABNORMAL LOW (ref 13.0–17.0)
MCH: 30.6 pg (ref 26.0–34.0)
MCH: 30 pg (ref 26.0–34.0)
MCHC: 34.5 g/dL (ref 30.0–36.0)
MCHC: 33.6 g/dL (ref 30.0–36.0)
MCV: 88 fL (ref 78.0–100.0)
MCV: 89.2 fL (ref 78.0–100.0)
Platelets: 103 10*3/uL — ABNORMAL LOW (ref 150–400)
Platelets: 108 10*3/uL — ABNORMAL LOW (ref 150–400)
RBC: 4.15 MIL/uL — ABNORMAL LOW (ref 4.22–5.81)
RBC: 4.24 MIL/uL (ref 4.22–5.81)
RDW: 13.3 % (ref 11.5–15.5)
RDW: 13.5 % (ref 11.5–15.5)
WBC: 8.9 10*3/uL (ref 4.0–10.5)
WBC: 9.6 10*3/uL (ref 4.0–10.5)

## 2012-01-02 LAB — APTT
aPTT: 66 seconds — ABNORMAL HIGH (ref 24–37)
aPTT: 29 seconds (ref 24–37)

## 2012-01-02 LAB — BASIC METABOLIC PANEL
BUN: 8 mg/dL (ref 6–23)
CO2: 27 mEq/L (ref 19–32)
Calcium: 9.2 mg/dL (ref 8.4–10.5)
Chloride: 100 mEq/L (ref 96–112)
Creatinine, Ser: 0.88 mg/dL (ref 0.50–1.35)
GFR calc Af Amer: >90 mL/min (ref >90)
GFR calc non Af Amer: >90 mL/min (ref >90)
Glucose, Bld: 89 mg/dL (ref 70–99)
Potassium: 3.9 mEq/L (ref 3.5–5.1)
Sodium: 137 mEq/L (ref 135–145)

## 2012-01-02 LAB — CREATININE, SERUM
Creatinine, Ser: 0.94 mg/dL (ref 0.50–1.35)
GFR calc Af Amer: >90 mL/min (ref >90)
GFR calc non Af Amer: >90 mL/min (ref >90)

## 2012-01-02 LAB — HEMOGLOBIN AND HEMATOCRIT, BLOOD
HCT: 33.3 % — ABNORMAL LOW (ref 39.0–52.0)
Hemoglobin: 11.7 g/dL — ABNORMAL LOW (ref 13.0–17.0)

## 2012-01-02 LAB — PLATELET COUNT: Platelets: 127 10*3/uL — ABNORMAL LOW (ref 150–400)

## 2012-01-02 LAB — HEPARIN LEVEL (UNFRACTIONATED): Heparin Unfractionated: <0.1 IU/mL — ABNORMAL LOW (ref 0.30–0.70)

## 2012-01-02 LAB — HEMOGLOBIN A1C: Mean Plasma Glucose: 105 mg/dL (ref <117)

## 2012-01-02 LAB — MAGNESIUM: Magnesium: 3 mg/dL — ABNORMAL HIGH (ref 1.5–2.5)

## 2012-01-02 LAB — POCT I-STAT, CHEM 8
BUN: 6 mg/dL (ref 6–23)
Calcium, Ion: 1.11 mmol/L — ABNORMAL LOW (ref 1.12–1.23)
Chloride: 103 mEq/L (ref 96–112)
Glucose, Bld: 142 mg/dL — ABNORMAL HIGH (ref 70–99)

## 2012-01-02 SURGERY — CORONARY ARTERY BYPASS GRAFTING (CABG)
Anesthesia: General | Site: Chest | Wound class: Clean

## 2012-01-02 MED ORDER — ROCURONIUM BROMIDE 100 MG/10ML IV SOLN
INTRAVENOUS | Status: DC | PRN
  Administered 2012-01-02: 100 mg via INTRAVENOUS

## 2012-01-02 MED ORDER — SODIUM CHLORIDE 0.9 % IJ SOLN
3.0000 mL | Freq: Two times a day (BID) | INTRAMUSCULAR | Status: DC
  Administered 2012-01-03 – 2012-01-05 (×5): 3 mL via INTRAVENOUS

## 2012-01-02 MED ORDER — 0.9 % SODIUM CHLORIDE (POUR BTL) OPTIME
TOPICAL | Status: DC | PRN
  Administered 2012-01-02: 1000 mL

## 2012-01-02 MED ORDER — ACETAMINOPHEN 500 MG PO TABS
1000.0000 mg | ORAL_TABLET | Freq: Four times a day (QID) | ORAL | Status: DC
  Administered 2012-01-02 – 2012-01-07 (×18): 1000 mg via ORAL
  Filled 2012-01-02 (×18): qty 2

## 2012-01-02 MED ORDER — POTASSIUM CHLORIDE 10 MEQ/50ML IV SOLN: 10.0000 meq | INTRAVENOUS | Status: AC

## 2012-01-02 MED ORDER — METOPROLOL TARTRATE 25 MG/10 ML ORAL SUSPENSION
12.5000 mg | Freq: Two times a day (BID) | ORAL | Status: DC
  Filled 2012-01-02 (×3): qty 5

## 2012-01-02 MED ORDER — INSULIN ASPART 100 UNIT/ML ~~LOC~~ SOLN: 0.0000 [IU] | SUBCUTANEOUS | Status: DC

## 2012-01-02 MED ORDER — LIDOCAINE HCL (CARDIAC) 20 MG/ML IV SOLN
INTRAVENOUS | Status: DC | PRN
  Administered 2012-01-02: 50 mg via INTRAVENOUS

## 2012-01-02 MED ORDER — LACTATED RINGERS IV SOLN: INTRAVENOUS | Status: DC

## 2012-01-02 MED ORDER — ACETAMINOPHEN 650 MG RE SUPP
650.0000 mg | RECTAL | Status: AC
  Administered 2012-01-02: 650 mg via RECTAL

## 2012-01-02 MED ORDER — ASPIRIN EC 325 MG PO TBEC
325.0000 mg | DELAYED_RELEASE_TABLET | Freq: Every day | ORAL | Status: DC
  Administered 2012-01-03 – 2012-01-07 (×5): 325 mg via ORAL
  Filled 2012-01-02 (×5): qty 1

## 2012-01-02 MED ORDER — PROTAMINE SULFATE 10 MG/ML IV SOLN
INTRAVENOUS | Status: DC | PRN
  Administered 2012-01-02: 100 mg via INTRAVENOUS
  Administered 2012-01-02: 180 mg via INTRAVENOUS
  Administered 2012-01-02: 20 mg via INTRAVENOUS

## 2012-01-02 MED ORDER — MORPHINE SULFATE 2 MG/ML IJ SOLN
2.0000 mg | INTRAMUSCULAR | Status: DC | PRN
  Administered 2012-01-02: 2 mg via INTRAVENOUS
  Administered 2012-01-03 (×9): 4 mg via INTRAVENOUS
  Filled 2012-01-02 (×4): qty 2
  Filled 2012-01-02: qty 1
  Filled 2012-01-02 (×4): qty 2

## 2012-01-02 MED ORDER — ALBUTEROL SULFATE HFA 108 (90 BASE) MCG/ACT IN AERS
INHALATION_SPRAY | RESPIRATORY_TRACT | Status: DC | PRN
  Administered 2012-01-02: 4 via RESPIRATORY_TRACT

## 2012-01-02 MED ORDER — HEPARIN SODIUM (PORCINE) 1000 UNIT/ML IJ SOLN
INTRAMUSCULAR | Status: AC
  Filled 2012-01-02: qty 1

## 2012-01-02 MED ORDER — SODIUM CHLORIDE 0.9 % IV SOLN: INTRAVENOUS | Status: DC

## 2012-01-02 MED ORDER — TRANEXAMIC ACID 100 MG/ML IV SOLN
1.5000 mg/kg/h | INTRAVENOUS | Status: AC
  Filled 2012-01-02: qty 25

## 2012-01-02 MED ORDER — DOCUSATE SODIUM 100 MG PO CAPS
200.0000 mg | ORAL_CAPSULE | Freq: Every day | ORAL | Status: DC
  Administered 2012-01-03 – 2012-01-07 (×4): 200 mg via ORAL
  Filled 2012-01-02 (×5): qty 2

## 2012-01-02 MED ORDER — VECURONIUM BROMIDE 10 MG IV SOLR
INTRAVENOUS | Status: DC | PRN
  Administered 2012-01-02 (×4): 5 mg via INTRAVENOUS

## 2012-01-02 MED ORDER — NITROGLYCERIN IN D5W 200-5 MCG/ML-% IV SOLN: 0.0000 ug/min | INTRAVENOUS | Status: DC

## 2012-01-02 MED ORDER — LACTATED RINGERS IV SOLN
INTRAVENOUS | Status: DC | PRN
  Administered 2012-01-02: 08:00:00 via INTRAVENOUS

## 2012-01-02 MED ORDER — DROPERIDOL 2.5 MG/ML IJ SOLN
0.6250 mg | INTRAMUSCULAR | Status: DC | PRN
  Filled 2012-01-02: qty 0.25

## 2012-01-02 MED ORDER — FENTANYL CITRATE 0.05 MG/ML IJ SOLN
INTRAMUSCULAR | Status: DC | PRN
  Administered 2012-01-02: 550 ug via INTRAVENOUS
  Administered 2012-01-02: 100 ug via INTRAVENOUS
  Administered 2012-01-02: 250 ug via INTRAVENOUS
  Administered 2012-01-02 (×2): 50 ug via INTRAVENOUS
  Administered 2012-01-02 (×4): 250 ug via INTRAVENOUS
  Administered 2012-01-02: 150 ug via INTRAVENOUS
  Administered 2012-01-02: 250 ug via INTRAVENOUS
  Administered 2012-01-02 (×2): 100 ug via INTRAVENOUS

## 2012-01-02 MED ORDER — LACTATED RINGERS IV SOLN: 500.0000 mL | Freq: Once | INTRAVENOUS | Status: AC | PRN

## 2012-01-02 MED ORDER — MIDAZOLAM HCL 5 MG/5ML IJ SOLN
INTRAMUSCULAR | Status: DC | PRN
  Administered 2012-01-02 (×2): 2 mg via INTRAVENOUS
  Administered 2012-01-02: 1 mg via INTRAVENOUS
  Administered 2012-01-02: 2 mg via INTRAVENOUS
  Administered 2012-01-02: 3 mg via INTRAVENOUS

## 2012-01-02 MED ORDER — OXYCODONE HCL 5 MG PO TABS
5.0000 mg | ORAL_TABLET | Freq: Once | ORAL | Status: AC | PRN
  Filled 2012-01-02: qty 2

## 2012-01-02 MED ORDER — SODIUM CHLORIDE 0.9 % IV SOLN: 250.0000 mL | INTRAVENOUS | Status: DC

## 2012-01-02 MED ORDER — SODIUM CHLORIDE 0.9 % IJ SOLN
OROMUCOSAL | Status: DC | PRN
  Administered 2012-01-02 (×3): via TOPICAL

## 2012-01-02 MED ORDER — DEXMEDETOMIDINE HCL IN NACL 200 MCG/50ML IV SOLN
0.1000 ug/kg/h | INTRAVENOUS | Status: DC
  Administered 2012-01-02: 0.2 ug/kg/h via INTRAVENOUS
  Administered 2012-01-02: 0.5 ug/kg/h via INTRAVENOUS
  Filled 2012-01-02 (×2): qty 50

## 2012-01-02 MED ORDER — MORPHINE SULFATE 2 MG/ML IJ SOLN
1.0000 mg | INTRAMUSCULAR | Status: AC | PRN
  Administered 2012-01-02 (×2): 2 mg via INTRAVENOUS
  Filled 2012-01-02: qty 1
  Filled 2012-01-02: qty 2
  Filled 2012-01-02: qty 1

## 2012-01-02 MED ORDER — ACETAMINOPHEN 160 MG/5ML PO SOLN: 650.0000 mg | ORAL | Status: AC

## 2012-01-02 MED ORDER — SODIUM BICARBONATE 8.4 % IV SOLN
INTRAVENOUS | Status: AC
  Filled 2012-01-02: qty 50

## 2012-01-02 MED ORDER — INSULIN ASPART 100 UNIT/ML ~~LOC~~ SOLN
0.0000 [IU] | SUBCUTANEOUS | Status: AC
  Administered 2012-01-02 (×2): 2 [IU] via SUBCUTANEOUS

## 2012-01-02 MED ORDER — SODIUM CHLORIDE 0.45 % IV SOLN: INTRAVENOUS | Status: DC

## 2012-01-02 MED ORDER — MAGNESIUM SULFATE 40 MG/ML IJ SOLN
INTRAMUSCULAR | Status: AC
  Filled 2012-01-02: qty 100

## 2012-01-02 MED ORDER — PROPOFOL 10 MG/ML IV BOLUS
INTRAVENOUS | Status: DC | PRN
  Administered 2012-01-02: 130 mg via INTRAVENOUS

## 2012-01-02 MED ORDER — CHLORHEXIDINE GLUCONATE 4 % EX LIQD
CUTANEOUS | Status: AC
  Administered 2012-01-02: 05:00:00
  Filled 2012-01-02: qty 60

## 2012-01-02 MED ORDER — HEPARIN SODIUM (PORCINE) 1000 UNIT/ML IJ SOLN
INTRAMUSCULAR | Status: DC | PRN
  Administered 2012-01-02: 32000 [IU] via INTRAVENOUS

## 2012-01-02 MED ORDER — ALBUMIN HUMAN 5 % IV SOLN
250.0000 mL | INTRAVENOUS | Status: AC | PRN
  Administered 2012-01-02: 250 mL via INTRAVENOUS

## 2012-01-02 MED ORDER — OXYCODONE HCL 5 MG/5ML PO SOLN: 5.0000 mg | Freq: Once | ORAL | Status: AC | PRN

## 2012-01-02 MED ORDER — BISACODYL 10 MG RE SUPP: 10.0000 mg | Freq: Every day | RECTAL | Status: DC

## 2012-01-02 MED ORDER — MIDAZOLAM HCL 2 MG/2ML IJ SOLN: 2.0000 mg | INTRAMUSCULAR | Status: DC | PRN

## 2012-01-02 MED ORDER — BISACODYL 5 MG PO TBEC
10.0000 mg | DELAYED_RELEASE_TABLET | Freq: Every day | ORAL | Status: DC
  Administered 2012-01-03 – 2012-01-04 (×2): 10 mg via ORAL
  Filled 2012-01-02 (×4): qty 2

## 2012-01-02 MED ORDER — ACETAMINOPHEN 160 MG/5ML PO SOLN
975.0000 mg | Freq: Four times a day (QID) | ORAL | Status: DC
  Filled 2012-01-02: qty 40.6

## 2012-01-02 MED ORDER — INSULIN REGULAR BOLUS VIA INFUSION
0.0000 [IU] | Freq: Three times a day (TID) | INTRAVENOUS | Status: DC
  Filled 2012-01-02: qty 10

## 2012-01-02 MED ORDER — PHENYLEPHRINE HCL 10 MG/ML IJ SOLN
0.0000 ug/min | INTRAVENOUS | Status: DC
  Filled 2012-01-02: qty 2

## 2012-01-02 MED ORDER — OXYCODONE HCL 5 MG PO TABS
5.0000 mg | ORAL_TABLET | ORAL | Status: DC | PRN
  Administered 2012-01-02 – 2012-01-06 (×17): 10 mg via ORAL
  Filled 2012-01-02 (×16): qty 2

## 2012-01-02 MED ORDER — FAMOTIDINE IN NACL 20-0.9 MG/50ML-% IV SOLN
20.0000 mg | Freq: Two times a day (BID) | INTRAVENOUS | Status: AC
  Administered 2012-01-02 (×2): 20 mg via INTRAVENOUS
  Filled 2012-01-02: qty 50

## 2012-01-02 MED ORDER — SODIUM CHLORIDE 0.9 % IJ SOLN: 3.0000 mL | INTRAMUSCULAR | Status: DC | PRN

## 2012-01-02 MED ORDER — DEXTROSE 5 % IV SOLN
1.5000 g | Freq: Two times a day (BID) | INTRAVENOUS | Status: AC
  Administered 2012-01-02 – 2012-01-04 (×4): 1.5 g via INTRAVENOUS
  Filled 2012-01-02 (×4): qty 1.5

## 2012-01-02 MED ORDER — VANCOMYCIN HCL IN DEXTROSE 1-5 GM/200ML-% IV SOLN
1000.0000 mg | Freq: Once | INTRAVENOUS | Status: AC
  Administered 2012-01-02: 1000 mg via INTRAVENOUS
  Filled 2012-01-02: qty 200

## 2012-01-02 MED ORDER — ALBUMIN HUMAN 5 % IV SOLN
INTRAVENOUS | Status: AC
  Filled 2012-01-02: qty 250

## 2012-01-02 MED ORDER — ASPIRIN 81 MG PO CHEW: 324.0000 mg | CHEWABLE_TABLET | Freq: Every day | ORAL | Status: DC

## 2012-01-02 MED ORDER — HEMOSTATIC AGENTS (NO CHARGE) OPTIME
TOPICAL | Status: DC | PRN
  Administered 2012-01-02: 1 via TOPICAL

## 2012-01-02 MED ORDER — HYDROMORPHONE HCL PF 1 MG/ML IJ SOLN: 0.2500 mg | INTRAMUSCULAR | Status: DC | PRN

## 2012-01-02 MED ORDER — LIDOCAINE HCL (CARDIAC) 20 MG/ML IV SOLN
INTRAVENOUS | Status: AC
  Filled 2012-01-02: qty 5

## 2012-01-02 MED ORDER — METOPROLOL TARTRATE 1 MG/ML IV SOLN: 2.5000 mg | INTRAVENOUS | Status: DC | PRN

## 2012-01-02 MED ORDER — METOPROLOL TARTRATE 12.5 MG HALF TABLET
12.5000 mg | ORAL_TABLET | Freq: Two times a day (BID) | ORAL | Status: DC
  Filled 2012-01-02 (×3): qty 1

## 2012-01-02 MED ORDER — MAGNESIUM SULFATE 40 MG/ML IJ SOLN
4.0000 g | Freq: Once | INTRAMUSCULAR | Status: AC
  Administered 2012-01-02: 4 g via INTRAVENOUS

## 2012-01-02 MED ORDER — LEVALBUTEROL HCL 0.63 MG/3ML IN NEBU
0.6300 mg | INHALATION_SOLUTION | Freq: Four times a day (QID) | RESPIRATORY_TRACT | Status: DC
  Administered 2012-01-02 – 2012-01-05 (×13): 0.63 mg via RESPIRATORY_TRACT
  Filled 2012-01-02 (×16): qty 3

## 2012-01-02 MED ORDER — ONDANSETRON HCL 4 MG/2ML IJ SOLN
4.0000 mg | Freq: Four times a day (QID) | INTRAMUSCULAR | Status: DC | PRN
  Administered 2012-01-05: 4 mg via INTRAVENOUS
  Filled 2012-01-02: qty 2

## 2012-01-02 MED ORDER — PANTOPRAZOLE SODIUM 40 MG PO TBEC
40.0000 mg | DELAYED_RELEASE_TABLET | Freq: Every day | ORAL | Status: DC
  Administered 2012-01-04 – 2012-01-06 (×3): 40 mg via ORAL
  Filled 2012-01-02 (×2): qty 1

## 2012-01-02 SURGICAL SUPPLY — 109 items
ATTRACTOMAT 16X20 MAGNETIC DRP (DRAPES) ×3 IMPLANT
BAG DECANTER FOR FLEXI CONT (MISCELLANEOUS) ×3 IMPLANT
BANDAGE ELASTIC 4 VELCRO ST LF (GAUZE/BANDAGES/DRESSINGS) ×3 IMPLANT
BANDAGE ELASTIC 6 VELCRO ST LF (GAUZE/BANDAGES/DRESSINGS) ×3 IMPLANT
BANDAGE GAUZE ELAST BULKY 4 IN (GAUZE/BANDAGES/DRESSINGS) ×3 IMPLANT
BLADE STERNUM SYSTEM 6 (BLADE) ×3 IMPLANT
BLADE SURG 11 STRL SS (BLADE) ×6 IMPLANT
BLADE SURG ROTATE 9660 (MISCELLANEOUS) ×3 IMPLANT
CANISTER SUCTION 2500CC (MISCELLANEOUS) ×3 IMPLANT
CANN PRFSN .5XCNCT 15X34-48 (MISCELLANEOUS) ×2
CANNULA AORTIC HI-FLOW 6.5M20F (CANNULA) ×3 IMPLANT
CANNULA PRFSN .5XCNCT 15X34-48 (MISCELLANEOUS) ×2 IMPLANT
CANNULA VEN 2 STAGE (MISCELLANEOUS) ×1
CATH CPB KIT GERHARDT (MISCELLANEOUS) ×3 IMPLANT
CATH THORACIC 28FR (CATHETERS) ×3 IMPLANT
CATH THORACIC 36FR (CATHETERS) IMPLANT
CATH THORACIC 36FR RT ANG (CATHETERS) IMPLANT
CLIP TI MEDIUM 24 (CLIP) IMPLANT
CLIP TI WIDE RED SMALL 24 (CLIP) IMPLANT
CLOTH BEACON ORANGE TIMEOUT ST (SAFETY) ×3 IMPLANT
COVER SURGICAL LIGHT HANDLE (MISCELLANEOUS) ×6 IMPLANT
CRADLE DONUT ADULT HEAD (MISCELLANEOUS) ×3 IMPLANT
DERMABOND ADVANCED (GAUZE/BANDAGES/DRESSINGS) ×1
DERMABOND ADVANCED .7 DNX12 (GAUZE/BANDAGES/DRESSINGS) ×2 IMPLANT
DRAIN CHANNEL 28F RND 3/8 FF (WOUND CARE) ×3 IMPLANT
DRAIN CHANNEL 32F RND 10.7 FF (WOUND CARE) IMPLANT
DRAPE CARDIOVASCULAR INCISE (DRAPES) ×1
DRAPE SLUSH MACHINE 52X66 (DRAPES) IMPLANT
DRAPE SLUSH/WARMER DISC (DRAPES) ×3 IMPLANT
DRAPE SRG 135X102X78XABS (DRAPES) ×2 IMPLANT
DRSG COVADERM 4X14 (GAUZE/BANDAGES/DRESSINGS) ×3 IMPLANT
ELECT BLADE 4.0 EZ CLEAN MEGAD (MISCELLANEOUS) ×3
ELECT CAUTERY BLADE 6.4 (BLADE) ×3 IMPLANT
ELECT REM PT RETURN 9FT ADLT (ELECTROSURGICAL) ×6
ELECTRODE BLDE 4.0 EZ CLN MEGD (MISCELLANEOUS) ×2 IMPLANT
ELECTRODE REM PT RTRN 9FT ADLT (ELECTROSURGICAL) ×4 IMPLANT
GLOVE BIO SURGEON STRL SZ 6 (GLOVE) ×3 IMPLANT
GLOVE BIO SURGEON STRL SZ 6.5 (GLOVE) ×9 IMPLANT
GLOVE BIO SURGEON STRL SZ7 (GLOVE) IMPLANT
GLOVE BIO SURGEON STRL SZ7.5 (GLOVE) IMPLANT
GLOVE BIOGEL PI IND STRL 6 (GLOVE) ×8 IMPLANT
GLOVE BIOGEL PI IND STRL 6.5 (GLOVE) ×2 IMPLANT
GLOVE BIOGEL PI IND STRL 7.0 (GLOVE) ×2 IMPLANT
GLOVE BIOGEL PI INDICATOR 6 (GLOVE) ×4
GLOVE BIOGEL PI INDICATOR 6.5 (GLOVE) ×1
GLOVE BIOGEL PI INDICATOR 7.0 (GLOVE) ×1
GOWN STRL NON-REIN LRG LVL3 (GOWN DISPOSABLE) ×15 IMPLANT
HEMOSTAT POWDER SURGIFOAM 1G (HEMOSTASIS) ×9 IMPLANT
HEMOSTAT SURGICEL 2X14 (HEMOSTASIS) ×3 IMPLANT
KIT BASIN OR (CUSTOM PROCEDURE TRAY) ×3 IMPLANT
KIT ROOM TURNOVER OR (KITS) ×3 IMPLANT
KIT SUCTION CATH 14FR (SUCTIONS) ×6 IMPLANT
KIT VASOVIEW W/TROCAR VH 2000 (KITS) ×3 IMPLANT
LEAD PACING MYOCARDI (MISCELLANEOUS) ×3 IMPLANT
MARKER GRAFT CORONARY BYPASS (MISCELLANEOUS) ×9 IMPLANT
MARKER SKIN DUAL TIP RULER LAB (MISCELLANEOUS) ×3 IMPLANT
NS IRRIG 1000ML POUR BTL (IV SOLUTION) ×18 IMPLANT
PACK OPEN HEART (CUSTOM PROCEDURE TRAY) ×3 IMPLANT
PAD ARMBOARD 7.5X6 YLW CONV (MISCELLANEOUS) ×6 IMPLANT
PENCIL BUTTON HOLSTER BLD 10FT (ELECTRODE) ×3 IMPLANT
PUNCH AORTIC ROTATE 4.5MM 8IN (MISCELLANEOUS) ×3 IMPLANT
SET CARDIOPLEGIA MPS 5001102 (MISCELLANEOUS) ×3 IMPLANT
SOLUTION ANTI FOG 6CC (MISCELLANEOUS) IMPLANT
SPONGE GAUZE 4X4 12PLY (GAUZE/BANDAGES/DRESSINGS) ×6 IMPLANT
SPONGE LAP 18X18 X RAY DECT (DISPOSABLE) ×6 IMPLANT
SPONGE LAP 4X18 X RAY DECT (DISPOSABLE) IMPLANT
SUT BONE WAX W31G (SUTURE) ×3 IMPLANT
SUT MNCRL AB 4-0 PS2 18 (SUTURE) ×3 IMPLANT
SUT PROLENE 3 0 SH DA (SUTURE) IMPLANT
SUT PROLENE 3 0 SH1 36 (SUTURE) ×6 IMPLANT
SUT PROLENE 4 0 RB 1 (SUTURE) ×1
SUT PROLENE 4 0 SH DA (SUTURE) IMPLANT
SUT PROLENE 4 0 TF (SUTURE) ×6 IMPLANT
SUT PROLENE 4-0 RB1 .5 CRCL 36 (SUTURE) ×2 IMPLANT
SUT PROLENE 5 0 C 1 36 (SUTURE) ×3 IMPLANT
SUT PROLENE 6 0 C 1 30 (SUTURE) ×9 IMPLANT
SUT PROLENE 6 0 CC (SUTURE) ×12 IMPLANT
SUT PROLENE 7 0 BV 1 (SUTURE) ×9 IMPLANT
SUT PROLENE 7 0 BV1 MDA (SUTURE) ×3 IMPLANT
SUT PROLENE 7.0 RB 3 (SUTURE) IMPLANT
SUT PROLENE 8 0 BV175 6 (SUTURE) ×21 IMPLANT
SUT SILK  1 MH (SUTURE)
SUT SILK 1 MH (SUTURE) IMPLANT
SUT SILK 2 0 SH CR/8 (SUTURE) IMPLANT
SUT SILK 3 0 SH CR/8 (SUTURE) IMPLANT
SUT STEEL 6MS V (SUTURE) ×3 IMPLANT
SUT STEEL STERNAL CCS#1 18IN (SUTURE) IMPLANT
SUT STEEL SZ 6 DBL 3X14 BALL (SUTURE) ×3 IMPLANT
SUT VIC AB 1 CTX 18 (SUTURE) ×6 IMPLANT
SUT VIC AB 1 CTX 36 (SUTURE)
SUT VIC AB 1 CTX36XBRD ANBCTR (SUTURE) IMPLANT
SUT VIC AB 2-0 CT1 27 (SUTURE) ×1
SUT VIC AB 2-0 CT1 TAPERPNT 27 (SUTURE) ×2 IMPLANT
SUT VIC AB 2-0 CTX 27 (SUTURE) IMPLANT
SUT VIC AB 3-0 SH 27 (SUTURE)
SUT VIC AB 3-0 SH 27X BRD (SUTURE) IMPLANT
SUT VIC AB 3-0 X1 27 (SUTURE) IMPLANT
SUT VICRYL 4-0 PS2 18IN ABS (SUTURE) IMPLANT
SUTURE E-PAK OPEN HEART (SUTURE) ×3 IMPLANT
SYSTEM SAHARA CHEST DRAIN ATS (WOUND CARE) ×3 IMPLANT
TAPE CLOTH SURG 4X10 WHT LF (GAUZE/BANDAGES/DRESSINGS) ×3 IMPLANT
TOWEL OR 17X24 6PK STRL BLUE (TOWEL DISPOSABLE) ×6 IMPLANT
TOWEL OR 17X26 10 PK STRL BLUE (TOWEL DISPOSABLE) ×6 IMPLANT
TRAY FOLEY IC TEMP SENS 14FR (CATHETERS) ×3 IMPLANT
TUBE FEEDING 8FR 16IN STR KANG (MISCELLANEOUS) ×3 IMPLANT
TUBE SUCT INTRACARD DLP 20F (MISCELLANEOUS) ×3 IMPLANT
TUBING INSUFFLATION 10FT LAP (TUBING) ×3 IMPLANT
UNDERPAD 30X30 INCONTINENT (UNDERPADS AND DIAPERS) ×3 IMPLANT
WATER STERILE IRR 1000ML POUR (IV SOLUTION) ×6 IMPLANT

## 2012-01-02 NOTE — Preoperative (Signed)
Beta Blockers   Reason not to administer Beta Blockers:Not Applicable 

## 2012-01-02 NOTE — Progress Notes (Signed)
ANTICOAGULATION CONSULT NOTE - Follow Up Consult  Pharmacy Consult for heparin Indication: severe 3 vessel dx/ s/p cath  Allergies  Allergen Reactions  . Lisinopril Cough    Patient Measurements: Height: 5\' 9"  (175.3 cm) Weight: 240 lb 8.4 oz (109.1 kg) IBW/kg (Calculated) : 70.7    Vital Signs: Temp: 98.7 F (37.1 C) (09/10 2343) Temp src: Oral (09/10 2343) BP: 140/79 mmHg (09/10 2343) Pulse Rate: 63  (09/10 2343)  Labs:  Basename 01/01/12 2302 01/01/12 0430 12/31/11 2238 12/31/11 1725 12/31/11 1724 12/31/11 0804  HGB -- 13.9 -- -- -- 14.4  HCT -- 40.5 -- -- -- 41.8  PLT -- 156 -- -- -- 159  APTT -- -- -- -- -- --  LABPROT -- -- -- 13.4 -- --  INR -- -- -- 1.00 -- --  HEPARINUNFRC <0.10* <0.10* -- <0.10* -- --  CREATININE -- 1.01 -- 0.94 -- 0.90  CKTOTAL -- -- -- -- -- --  CKMB -- -- -- -- -- --  TROPONINI -- <0.30 <0.30 -- <0.30 --    Estimated Creatinine Clearance: 106.6 ml/min (by C-G formula based on Cr of 1.01).  Assessment: 51 yo male s/p cath awaiting CABG for Heparin  Goal of Therapy:  Heparin level 0.3-0.5   Plan:  Increase Heparin 2200 units/hr F/U after CABG  Geannie Risen, PharmD, BCPS  01/02/2012 2:01 AM

## 2012-01-02 NOTE — Transfer of Care (Signed)
Immediate Anesthesia Transfer of Care Note  Patient: [name]  Procedure(s) Performed: Procedure(s) (LRB) with comments: CORONARY ARTERY BYPASS GRAFTING (CABG) (N/A) - times three using left internal mammary artery and right endoscopically harvested saphenous vein TRANSESOPHAGEAL ECHOCARDIOGRAM (TEE) (N/A)  Patient Location: SICU  Anesthesia Type: General  Level of Consciousness: Patient remains intubated per anesthesia plan  Airway & Oxygen Therapy: Patient remains intubated per anesthesia plan and Patient placed on Ventilator (see vital sign flow sheet for setting)  Post-op Assessment: Report given to PACU RN and Post -op Vital signs reviewed and stable  Post vital signs: Reviewed and stable  Complications: No apparent anesthesia complications

## 2012-01-02 NOTE — Anesthesia Postprocedure Evaluation (Signed)
Anesthesia Post Note  Patient: [name]  Procedure(s) Performed: Procedure(s) (LRB): CORONARY ARTERY BYPASS GRAFTING (CABG) (N/A) TRANSESOPHAGEAL ECHOCARDIOGRAM (TEE) (N/A)  Anesthesia type: General  Patient location: ICU  Post pain: Pain level controlled  Post assessment: Post-op Vital signs reviewed  Last Vitals:  Filed Vitals:   01/02/12 1530  BP: 94/59  Pulse: 87  Temp: 36.3 C  Resp: 12    Post vital signs: stable  Level of consciousness: Patient remains intubated per anesthesia plan  Complications: No apparent anesthesia complications

## 2012-01-02 NOTE — Progress Notes (Signed)
[facility] BRIEF SICU PROGRESS NOTE  Day of Surgery  S/P Procedure(s) (LRB): CORONARY ARTERY BYPASS GRAFTING (CABG) (N/A) TRANSESOPHAGEAL ECHOCARDIOGRAM (TEE) (N/A)   Waking up on vent, likely ready for extubation soon Stable hemodynamics Chest tube output low UOP adequate Labs okay  Plan: Continue routine early postop  OWEN,CLARENCE H 01/02/2012 6:08 PM

## 2012-01-02 NOTE — Anesthesia Preprocedure Evaluation (Addendum)
Anesthesia Evaluation  Patient identified by MRN, date of birth, ID band Patient awake    Reviewed: Allergy & Precautions, H&P , NPO status , Patient's Chart, lab work & pertinent test results, reviewed documented beta blocker date and time , Unable to perform ROS - Chart review only  Airway Mallampati: III TM Distance: >3 FB Neck ROM: Full    Dental  (+) Dental Advisory Given and Poor Dentition   Pulmonary shortness of breath and with exertion, COPD COPD inhaler, Current Smoker,    Pulmonary exam normal       Cardiovascular hypertension, Pt. on home beta blockers and Pt. on medications + angina + CAD and + Past MI Rhythm:Regular Rate:Normal     Neuro/Psych    GI/Hepatic negative GI ROS, Neg liver ROS,   Endo/Other  negative endocrine ROS  Renal/GU negative Renal ROS     Musculoskeletal   Abdominal   Peds  Hematology   Anesthesia Other Findings   Reproductive/Obstetrics                          Anesthesia Physical Anesthesia Plan  ASA: IV  Anesthesia Plan: General   Post-op Pain Management:    Induction: Intravenous  Airway Management Planned: Oral ETT  Additional Equipment: PA Cath and Arterial line  Intra-op Plan:   Post-operative Plan: Possible Post-op intubation/ventilation  Informed Consent: I have reviewed the patients History and Physical, chart, labs and discussed the procedure including the risks, benefits and alternatives for the proposed anesthesia with the patient or authorized representative who has indicated his/her understanding and acceptance.   Dental advisory given  Plan Discussed with: CRNA, Anesthesiologist and Surgeon  Anesthesia Plan Comments:         Anesthesia Quick Evaluation

## 2012-01-02 NOTE — Anesthesia Procedure Notes (Addendum)
Procedure Name: Intubation Date/Time: 01/02/2012 8:33 AM Performed by: Jerilee Hoh Pre-anesthesia Checklist: Patient identified, Emergency Drugs available, Suction available and Patient being monitored Patient Re-evaluated:Patient Re-evaluated prior to inductionOxygen Delivery Method: Circle system utilized Preoxygenation: Pre-oxygenation with 100% oxygen Intubation Type: IV induction Ventilation: Mask ventilation without difficulty Laryngoscope Size: Miller and 2 Grade View: Grade I Tube type: Oral Tube size: 8.0 mm Number of attempts: 1 Airway Equipment and Method: Stylet Placement Confirmation: ETT inserted through vocal cords under direct vision,  positive ETCO2 and breath sounds checked- equal and bilateral Secured at: 22 cm Tube secured with: Tape Dental Injury: Teeth and Oropharynx as per pre-operative assessment

## 2012-01-02 NOTE — Brief Op Note (Addendum)
12/31/2011 - 01/02/2012    PATIENT:  [name]  51 y.o. male  PRE-OPERATIVE DIAGNOSIS:  History of Coronary artery disease (s/p MI 02',PTCA with stents to LAD)  POST-OPERATIVE DIAGNOSIS:   History of Coronary artery disease (s/p MI 02',PTCA with stents to LAD)   PROCEDURE:  CORONARY ARTERY BYPASS GRAFTING (CABG) x 4 (LIMA to LAD, SVG sequentially to Circumflex and OM, SVG to Acute Marginal) with EVH from the right thigh and lower leg  SURGEON:  Surgeon(s) and Role:    * Delight Ovens, MD - Primary  PHYSICIAN ASSISTANT: Doree Fudge PA-C   ANESTHESIA:   general  EBL:  Total I/O In: -  Out: 415 [Urine:415]   DRAINS:  Chest Tube(s) in the Mediastinal and Pleural spaces    COUNTS CORRECT:  YES  DICTATION: .Dragon Dictation and Other Dictation: Dictation Number   PLAN OF CARE: Admit to inpatient   PATIENT DISPOSITION:  ICU - intubated and hemodynamically stable.   Delay start of Pharmacological VTE agent (>24hrs) due to surgical blood loss or risk of bleeding: yes   PRE OP WEIGHT: 101 kg

## 2012-01-02 NOTE — Procedures (Signed)
Extubation Procedure Note  Patient Details:   Name: [name] DOB: Sep 01, 1960 MRN: [id number]   Airway Documentation:     Evaluation  O2 sats: stable throughout and currently acceptable Complications: No apparent complications Patient did tolerate procedure well. Bilateral Breath Sounds: Rhonchi Suctioning: Airway Yes Pt awake and alert, extubated per protocol. Placed on 5 L [place], sat varying 92-96%. Positive cuff leaK, NIF -60, VC 850, BBS Rh. Pt able to vocalize.  Arloa Koh 01/02/2012, 6:23 PM

## 2012-01-03 ENCOUNTER — Encounter (HOSPITAL_COMMUNITY): Payer: Self-pay | Admitting: Cardiothoracic Surgery

## 2012-01-03 ENCOUNTER — Inpatient Hospital Stay (HOSPITAL_COMMUNITY): Payer: Medicaid Other

## 2012-01-03 LAB — CBC
HCT: 36.6 % — ABNORMAL LOW (ref 39.0–52.0)
Hemoglobin: 12.4 g/dL — ABNORMAL LOW (ref 13.0–17.0)
MCH: 30.6 pg (ref 26.0–34.0)
MCHC: 33.9 g/dL (ref 30.0–36.0)
MCV: 90.3 fL (ref 78.0–100.0)
Platelets: 114 10*3/uL — ABNORMAL LOW (ref 150–400)
RBC: 4.05 MIL/uL — ABNORMAL LOW (ref 4.22–5.81)
RDW: 13.5 % (ref 11.5–15.5)
WBC: 8.9 10*3/uL (ref 4.0–10.5)

## 2012-01-03 LAB — GLUCOSE, CAPILLARY
Glucose-Capillary: 100 mg/dL — ABNORMAL HIGH (ref 70–99)
Glucose-Capillary: 112 mg/dL — ABNORMAL HIGH (ref 70–99)
Glucose-Capillary: 115 mg/dL — ABNORMAL HIGH (ref 70–99)
Glucose-Capillary: 115 mg/dL — ABNORMAL HIGH (ref 70–99)
Glucose-Capillary: 118 mg/dL — ABNORMAL HIGH (ref 70–99)
Glucose-Capillary: 125 mg/dL — ABNORMAL HIGH (ref 70–99)
Glucose-Capillary: 129 mg/dL — ABNORMAL HIGH (ref 70–99)
Glucose-Capillary: 89 mg/dL (ref 70–99)

## 2012-01-03 LAB — BASIC METABOLIC PANEL
Chloride: 100 mEq/L (ref 96–112)
Creatinine, Ser: 0.88 mg/dL (ref 0.50–1.35)
GFR calc Af Amer: >90 mL/min (ref >90)
Potassium: 4.6 mEq/L (ref 3.5–5.1)

## 2012-01-03 LAB — POCT I-STAT, CHEM 8
BUN: 9 mg/dL (ref 6–23)
Calcium, Ion: 1.14 mmol/L (ref 1.12–1.23)
Chloride: 94 mEq/L — ABNORMAL LOW (ref 96–112)
Creatinine, Ser: 1.1 mg/dL (ref 0.50–1.35)
TCO2: 28 mmol/L (ref 0–100)

## 2012-01-03 LAB — CREATININE, SERUM: GFR calc non Af Amer: >90 mL/min (ref >90)

## 2012-01-03 LAB — MAGNESIUM: Magnesium: 2.6 mg/dL — ABNORMAL HIGH (ref 1.5–2.5)

## 2012-01-03 MED ORDER — TRAMADOL HCL 50 MG PO TABS
50.0000 mg | ORAL_TABLET | Freq: Four times a day (QID) | ORAL | Status: DC | PRN
  Administered 2012-01-03 – 2012-01-05 (×4): 50 mg via ORAL
  Filled 2012-01-03 (×4): qty 1

## 2012-01-03 MED ORDER — FUROSEMIDE 10 MG/ML IJ SOLN
20.0000 mg | Freq: Two times a day (BID) | INTRAMUSCULAR | Status: DC
  Administered 2012-01-03: 20 mg via INTRAVENOUS

## 2012-01-03 MED ORDER — INSULIN ASPART 100 UNIT/ML ~~LOC~~ SOLN
0.0000 [IU] | SUBCUTANEOUS | Status: DC
  Administered 2012-01-04: 2 [IU] via SUBCUTANEOUS

## 2012-01-03 MED ORDER — FUROSEMIDE 10 MG/ML IJ SOLN
40.0000 mg | Freq: Two times a day (BID) | INTRAMUSCULAR | Status: AC
  Administered 2012-01-03: 40 mg via INTRAVENOUS
  Filled 2012-01-03: qty 4

## 2012-01-03 MED ORDER — INSULIN ASPART 100 UNIT/ML ~~LOC~~ SOLN
0.0000 [IU] | SUBCUTANEOUS | Status: DC
  Administered 2012-01-03 (×2): 2 [IU] via SUBCUTANEOUS

## 2012-01-03 MED ORDER — METOPROLOL TARTRATE 25 MG PO TABS
25.0000 mg | ORAL_TABLET | Freq: Two times a day (BID) | ORAL | Status: DC
  Administered 2012-01-03 – 2012-01-04 (×3): 25 mg via ORAL
  Filled 2012-01-03 (×4): qty 1

## 2012-01-03 MED ORDER — METOPROLOL TARTRATE 25 MG/10 ML ORAL SUSPENSION
25.0000 mg | Freq: Two times a day (BID) | ORAL | Status: DC
Start: 2012-01-03 — End: 2012-01-04
  Filled 2012-01-03 (×4): qty 10

## 2012-01-03 MED FILL — Magnesium Sulfate Inj 50%: INTRAMUSCULAR | Qty: 2 | Status: AC

## 2012-01-03 MED FILL — Potassium Chloride Inj 2 mEq/ML: INTRAVENOUS | Qty: 40 | Status: AC

## 2012-01-03 NOTE — Progress Notes (Signed)
Stable postop day 1 following CABG Maintaining O2 sats greater than 92% Still having chest wall pain issues will add ULTRAM Lab Results  Component Value Date   WBC 10.9* 01/03/2012   HGB 13.3 01/03/2012   HCT 39.0 01/03/2012   MCV 90.4 01/03/2012   PLT 110* 01/03/2012    Lab Results  Component Value Date   CREATININE 1.10 01/03/2012   BUN 9 01/03/2012   NA 135 01/03/2012   K 4.2 01/03/2012   CL 94* 01/03/2012   CO2 26 01/03/2012

## 2012-01-03 NOTE — Progress Notes (Addendum)
1 Day Post-Op Procedure(s) (LRB): CORONARY ARTERY BYPASS GRAFTING (CABG) (N/A) TRANSESOPHAGEAL ECHOCARDIOGRAM (TEE) (N/A)  Subjective: Patient with incisional pain. A little "groggy" this am.  Objective: Vital signs in last 24 hours: Patient Vitals for the past 24 hrs:  BP Temp Temp src Pulse Resp SpO2 Weight  01/03/12 0600 123/69 mmHg 99 F (37.2 C) - 137  25  96 % 233 lb 7.5 oz (105.9 kg)  01/03/12 0500 101/65 mmHg 98.6 F (37 C) - 86  21  95 % -  01/03/12 0400 98/64 mmHg 98.8 F (37.1 C) - 86  18  95 % -  01/03/12 0300 103/63 mmHg 98.8 F (37.1 C) - 86  22  95 % -  01/03/12 0204 - - - - - 94 % -  01/03/12 0200 95/60 mmHg 99.1 F (37.3 C) - 86  28  96 % -  01/03/12 0100 88/62 mmHg 99.1 F (37.3 C) - 86  24  95 % -  01/03/12 0000 97/58 mmHg 99.1 F (37.3 C) - 86  20  95 % -  01/02/12 2300 93/60 mmHg 99 F (37.2 C) - 86  21  96 % -  01/02/12 2200 94/60 mmHg 99 F (37.2 C) - 86  15  96 % -  01/02/12 2100 - 99.1 F (37.3 C) - 92  18  95 % -  01/02/12 2045 - - - - - 98 % -  01/02/12 2000 112/74 mmHg 99.3 F (37.4 C) - 88  34  96 % -  01/02/12 1900 101/73 mmHg 99.3 F (37.4 C) Core 86  24  95 % -  01/02/12 1845 - 99.5 F (37.5 C) Core 86  24  95 % -  01/02/12 1830 92/68 mmHg 99.3 F (37.4 C) Core 87  23  93 % -  01/02/12 1815 - 99.5 F (37.5 C) Core 87  28  93 % -  01/02/12 1810 - 99.3 F (37.4 C) Core 89  26  96 % -  01/02/12 1800 113/69 mmHg 99.1 F (37.3 C) Core 86  21  93 % -  01/02/12 1745 - 99.1 F (37.3 C) Core 86  26  95 % -  01/02/12 1735 114/61 mmHg 99 F (37.2 C) - 87  21  93 % -  01/02/12 1730 107/70 mmHg 99 F (37.2 C) Core 86  26  93 % -  01/02/12 1715 141/86 mmHg 98.8 F (37.1 C) Core 87  23  94 % -  01/02/12 1700 141/86 mmHg 98.6 F (37 C) Core 86  20  99 % -  01/02/12 1645 - 98.1 F (36.7 C) Core 86  17  97 % -  01/02/12 1630 94/60 mmHg 98.1 F (36.7 C) Core 86  12  97 % -  01/02/12 1615 - 97.9 F (36.6 C) Core 86  14  98 % -  01/02/12 1600  94/58 mmHg 97.7 F (36.5 C) Core 86  12  98 % -  01/02/12 1545 - 97.5 F (36.4 C) Core 86  12  98 % -  01/02/12 1544 - - - - - 98 % -  01/02/12 1530 94/59 mmHg 97.3 F (36.3 C) Core 87  12  98 % -  01/02/12 1515 - 97.3 F (36.3 C) Core 87  12  98 % -  01/02/12 1500 95/57 mmHg 97.2 F (36.2 C) Core 87  13  97 % -  01/02/12 1445 - 97.2 F (36.2 C) Core 90  12  97 % -  01/02/12 1430 113/55 mmHg - - 94  12  96 % -   Pre op weight  101 kg Current Weight  01/03/12 233 lb 7.5 oz (105.9 kg)    Hemodynamic parameters for last 24 hours: PAP: (20-50)/(10-23) 47/19 mmHg CO:  [5.3 L/min-6.7 L/min] 6.7 L/min CI:  [2.5 L/min/m2-3.1 L/min/m2] 3.1 L/min/m2  Intake/Output from previous day: 09/11 0701 - 09/12 0700 In: 4303 [P.O.:180; I.V.:2833; Blood:640; IV Piggyback:650] Out: 4325 [Urine:3125; Blood:850; Chest Tube:350]   Physical Exam:  Cardiovascular: RRR Pulmonary: Diminished at bases bilaterally; no rales, wheezes, or rhonchi. Abdomen: Soft, non tender, bowel sounds present. Extremities: Mild RLE edema;SCD on LLE. Wounds: Dressing is clean and dry.  Lab Results: CBC: Basename 01/03/12 0410 01/02/12 2117 01/02/12 2030  WBC 8.9 -- 9.6  HGB 12.3* 12.9* --  HCT 36.4* 38.0* --  PLT 114* -- 108*   BMET:  Basename 01/03/12 0410 01/02/12 2117 01/02/12 0518  NA 134* 137 --  K 4.6 4.8 --  CL 100 103 --  CO2 26 -- 27  GLUCOSE 119* 142* --  BUN 8 6 --  CREATININE 0.88 1.00 --  CALCIUM 8.3* -- 9.2    PT/INR:  Lab Results  Component Value Date   INR 1.12 01/02/2012   INR 1.00 12/31/2011   ABG:  INR: Will add last result for INR, ABG once components are confirmed Will add last 4 CBG results once components are confirmed  Assessment/Plan:  1. CV - SR.Increase Lopressor to 25 bid for better BP control. 2.  Pulmonary - Chest tubes with 350 cc of output. Encourage incentive spirometer. CXR this am shows no ptx, bibasilar atelectasis, some vascular congestion, and small bilateral  pleural effusions. 3. Volume Overload - Begin diuresis. 4.  Acute blood loss anemia - H and H this am stable at 12.3 and 36.4 no transfusion needed 5.Thrombocytopenia-platelets 114,000. 6.Pre op HGA1C 5.3 CBGs 129/115/118. Will stop accu checks upon transfer. 7.Consider Toradol for pain 8.See progression orders  ZIMMERMAN,DONIELLE MPA-C 01/03/2012   I have seen and examined [name] and agree with the above assessment  and plan.  Delight Ovens MD Beeper [phone] Office [phone] 01/03/2012 8:47 AM

## 2012-01-04 ENCOUNTER — Inpatient Hospital Stay (HOSPITAL_COMMUNITY): Payer: Medicaid Other

## 2012-01-04 LAB — CBC
Hemoglobin: 12.6 g/dL — ABNORMAL LOW (ref 13.0–17.0)
MCH: 30.3 pg (ref 26.0–34.0)
MCHC: 33.7 g/dL (ref 30.0–36.0)
MCV: 89.9 fL (ref 78.0–100.0)
Platelets: 125 10*3/uL — ABNORMAL LOW (ref 150–400)
RBC: 4.16 MIL/uL — ABNORMAL LOW (ref 4.22–5.81)

## 2012-01-04 LAB — BASIC METABOLIC PANEL
BUN: 9 mg/dL (ref 6–23)
CO2: 30 mEq/L (ref 19–32)
Calcium: 9.1 mg/dL (ref 8.4–10.5)
GFR calc non Af Amer: >90 mL/min (ref >90)
Glucose, Bld: 132 mg/dL — ABNORMAL HIGH (ref 70–99)
Sodium: 133 mEq/L — ABNORMAL LOW (ref 135–145)

## 2012-01-04 LAB — GLUCOSE, CAPILLARY: Glucose-Capillary: 136 mg/dL — ABNORMAL HIGH (ref 70–99)

## 2012-01-04 MED ORDER — FUROSEMIDE 10 MG/ML IJ SOLN
40.0000 mg | Freq: Once | INTRAMUSCULAR | Status: AC
  Administered 2012-01-04: 40 mg via INTRAVENOUS
  Filled 2012-01-04: qty 4

## 2012-01-04 MED ORDER — INSULIN ASPART 100 UNIT/ML ~~LOC~~ SOLN: 0.0000 [IU] | Freq: Three times a day (TID) | SUBCUTANEOUS | Status: DC

## 2012-01-04 MED ORDER — GUAIFENESIN ER 600 MG PO TB12
600.0000 mg | ORAL_TABLET | Freq: Two times a day (BID) | ORAL | Status: DC
  Administered 2012-01-04 – 2012-01-07 (×7): 600 mg via ORAL
  Filled 2012-01-04: qty 2
  Filled 2012-01-04 (×7): qty 1

## 2012-01-04 MED ORDER — LORAZEPAM 0.5 MG PO TABS
0.5000 mg | ORAL_TABLET | Freq: Three times a day (TID) | ORAL | Status: DC | PRN
  Administered 2012-01-04 – 2012-01-06 (×6): 0.5 mg via ORAL
  Filled 2012-01-04 (×7): qty 1

## 2012-01-04 MED ORDER — INSULIN ASPART 100 UNIT/ML ~~LOC~~ SOLN
0.0000 [IU] | SUBCUTANEOUS | Status: DC
  Administered 2012-01-04 (×2): 2 [IU] via SUBCUTANEOUS

## 2012-01-04 MED ORDER — METOPROLOL TARTRATE 25 MG PO TABS
25.0000 mg | ORAL_TABLET | Freq: Three times a day (TID) | ORAL | Status: DC
  Administered 2012-01-04 (×2): 25 mg via ORAL
  Filled 2012-01-04 (×5): qty 1

## 2012-01-04 NOTE — Progress Notes (Signed)
POD # 2  Breathing comfortably  BP 144/92  Pulse 116  Temp 99 F (37.2 C) (Oral)  Resp 25  Ht 5\' 9"  (1.753 m)  Wt 229 lb 11.5 oz (104.2 kg)  BMI 33.92 kg/m2  SpO2 93%   Intake/Output Summary (Last 24 hours) at 01/04/12 1757 Last data filed at 01/04/12 1700  Gross per 24 hour  Intake   1174 ml  Output   3055 ml  Net  -1881 ml    Stable day, continue present care

## 2012-01-04 NOTE — Care Management Note (Signed)
    Page 1 of 1   01/04/2012     3:50:09 PM   CARE MANAGEMENT NOTE 01/04/2012  Patient:  [name],[name]   Account Number:  [id number]  Date Initiated:  12/31/2011  Documentation initiated by:  Junius Creamer  Subjective/Objective Assessment:   adm w ch pain     Action/Plan:   lives w fam, pcp dr Sanda Linger   Anticipated DC Date:  01/07/2012   Anticipated DC Plan:  HOME W HOME HEALTH SERVICES      DC Planning Services  CM consult      Choice offered to / List presented to:             Status of service:  In process, will continue to follow Medicare Important Message given?   (If response is "NO", the following Medicare IM given date fields will be blank) Date Medicare IM given:   Date Additional Medicare IM given:    Discharge Disposition:    Per UR Regulation:  Reviewed for med. necessity/level of care/duration of stay  If discussed at Long Length of Stay Meetings, dates discussed:    Comments:  01/03/12 Pierson Vantol,RN,BSN [phone] PT S/P CABG X4 ON 01/02/12.  PTA, PT INDEPENDENT, LIVES WITH WIFE AND CHILDREN.  FAMILY TO PROVIDE CARE AT DISCHARGE. WILL FOLLOW FOR HOME NEEDS AS PT PROGRESSES.  9/10 15:00 debbie dowell rn,bsn spoke w [facility] and pt has ins. getting cm sec to see if pt has coverage for meds.  9/9 16:15p debbie dowell rn,bsn [phone]

## 2012-01-04 NOTE — Progress Notes (Addendum)
[facility] DAILY PROGRESS NOTE                   [address]            [name] [id number]          [phone]      2 Days Post-Op Procedure(s) (LRB): CORONARY ARTERY BYPASS GRAFTING (CABG) (N/A) TRANSESOPHAGEAL ECHOCARDIOGRAM (TEE) (N/A)  Total Length of Stay:  LOS: 4 days   Subjective: Patient with productive cough.  Objective: Vital signs in last 24 hours: Temp:  [98.1 F (36.7 C)-99.8 F (37.7 C)] 98.1 F (36.7 C) (09/13 0729) Pulse Rate:  [85-115] 110  (09/13 0900) Cardiac Rhythm:  [-] Sinus tachycardia (09/13 0800) Resp:  [18-33] 24  (09/13 0900) BP: (117-159)/(73-88) 129/85 mmHg (09/13 0900) SpO2:  [91 %-98 %] 95 % (09/13 0900) Arterial Line BP: (128-151)/(60-66) 128/61 mmHg (09/12 1400) Weight:  [229 lb 11.5 oz (104.2 kg)] 229 lb 11.5 oz (104.2 kg) (09/13 0600)  Filed Weights   01/02/12 0443 01/03/12 0600 01/04/12 0600  Weight: 222 lb 3.6 oz (100.8 kg) 233 lb 7.5 oz (105.9 kg) 229 lb 11.5 oz (104.2 kg)   Weight change: -3 lb 12 oz (-1.7 kg)      Intake/Output from previous day: 09/12 0701 - 09/13 0700 In: 1427.9 [P.O.:820; I.V.:501.9; IV Piggyback:106] Out: 2630 [Urine:2430; Emesis/NG output:100; Chest Tube:100]  Intake/Output this shift: Total I/O In: 210 [P.O.:120; I.V.:40; IV Piggyback:50] Out: 100 [Urine:100]  Current Meds: Scheduled Meds:   . acetaminophen  1,000 mg Oral Q6H   Or  . acetaminophen (TYLENOL) oral liquid 160 mg/5 mL  975 mg Per Tube Q6H  . aspirin EC  325 mg Oral Daily   Or  . aspirin  324 mg Per Tube Daily  . atorvastatin  40 mg Oral q1800  . bisacodyl  10 mg Oral Daily   Or  . bisacodyl  10 mg Rectal Daily  . cefUROXime (ZINACEF)  IV  1.5 g Intravenous Q12H  . docusate sodium  200 mg Oral Daily  . furosemide  40 mg Intravenous BID  . insulin aspart  0-24 Units Subcutaneous Q4H  . levalbuterol  0.63 mg Nebulization Q6H  . metoprolol tartrate  25 mg Oral BID   Or  . metoprolol tartrate  25 mg Per Tube BID    . pantoprazole  40 mg Oral Q1200  . sodium chloride  3 mL Intravenous Q12H  . tranexamic acid (CYKLOKAPRON) infusion ([facility])  1.5 mg/kg/hr Intravenous To OR   Continuous Infusions:   . sodium chloride 20 mL/hr at 01/03/12 0600  . sodium chloride 20 mL/hr at 01/02/12 1430  . sodium chloride    . dexmedetomidine Stopped (01/03/12 0500)  . lactated ringers 20 mL/hr at 01/04/12 0600  . nitroGLYCERIN Stopped (01/03/12 1300)  . phenylephrine (NEO-SYNEPHRINE) Adult infusion     PRN Meds:.albumin human, droperidol, metoprolol, morphine injection, ondansetron (ZOFRAN) IV, oxyCODONE, sodium chloride, traMADol  General appearance: alert and cooperative Neurologic: intact Heart: regular rate and rhythm Lungs: diminished breath sounds base - both bases Abdomen: soft, non-tender; bowel sounds normal; no masses,  no organomegaly Extremities: Mild edema bilaterally Wound: Dressings are clean and dry  Lab Results: CBC: Basename 01/04/12 0415 01/03/12 1652 01/03/12 1650  WBC 13.5* -- 10.9*  HGB 12.6* 13.3 --  HCT 37.4* 39.0 --  PLT 125* -- 110*   BMET:  Basename 01/04/12 0415 01/03/12 1652 01/03/12 0410  NA 133* 135 --  K 4.2 4.2 --  CL 94* 94* --  CO2 30 -- 26  GLUCOSE 132* 110* --  BUN 9 9 --  CREATININE 0.95 1.10 --  CALCIUM 9.1 -- 8.3*    PT/INR:  Basename 01/02/12 1440  LABPROT 14.6  INR 1.12   Radiology: Dg Chest Portable 1 View In Am  01/04/2012  *RADIOLOGY REPORT*  Clinical Data: Postop  CABG  PORTABLE CHEST - 1 VIEW  Comparison: Portable chest x-ray of 01/03/2012  Findings: The Swan-Ganz catheter has been removed.  The lungs are not well aerated and there is pulmonary vascular congestion present with basilar atelectasis.  Mild cardiomegaly is stable.  The left chest tube has been removed and no pneumothorax is seen.  IMPRESSION: Swan-Ganz catheter and left chest tube removed.  Diminished aeration with slight increase in basilar atelectasis.  Possible mild pulmonary vascular  congestion.   Original Report Authenticated By: Juline Patch, M.D.    Dg Chest Portable 1 View In Am  01/03/2012  *RADIOLOGY REPORT*  Clinical Data: Coronary artery disease  PORTABLE CHEST - 1 VIEW  Comparison: Yesterday  Findings: Endotracheal and NG tubes removed.  Chest tubes stable. Vascular congestion increased. Right perihilar patchy opacities increased.  Bibasilar opacities increased.  No pneumothorax.  Under aerated lungs.  Stable Swan-Ganz catheter.  IMPRESSION: No pneumothorax.  Extubated.  Increased bilateral patchy opacities as described likely due to a combination of edema and volume loss.   Original Report Authenticated By: Donavan Burnet, M.D.    Dg Chest Portable 1 View  01/02/2012  *RADIOLOGY REPORT*  Clinical Data: Postop CABG.  PORTABLE CHEST - 1 VIEW 01/02/2012 1443 hours:  Comparison: Portable chest x-ray 12/31/2011.  Two-view chest x-ray 12/14/2010.  Findings: Sternotomy for CABG.  Endotracheal tube tip in satisfactory position projecting approximately 6 cm above the carina.  Swan-Ganz catheter tip projects over the main pulmonary trunk.  Left chest tube in place with no pneumothorax.  Minimal linear atelectasis at the left lung base.  Lungs otherwise clear. Pulmonary vascularity normal.  Mediastinal drainage tube.  IMPRESSION: Support apparatus satisfactory.  No pneumothorax.  Minimal left basilar atelectasis.  No acute cardiopulmonary disease otherwise.   Original Report Authenticated By: Arnell Sieving, M.D.      Assessment/Plan: S/P Procedure(s) (LRB): CORONARY ARTERY BYPASS GRAFTING (CABG) (N/A) TRANSESOPHAGEAL ECHOCARDIOGRAM (TEE) (N/A)  1. CV - SR.Will increase Lopressor to 25 tid for better HR control.  2. Pulmonary - Encourage incentive spirometer and flutter valve. CXR this am shows no ptx, low lung volumes,bibasilar atelectasis, some vascular congestion, and small bilateral pleural effusions. Mucinex for cough 3. Volume Overload - Begin diuresis.  4. Acute  blood loss anemia - H and H this am stable at 12.6 and 37.4. 5.Thrombocytopenia-platelets 125,000.  6.Pre op HGA1C 5.3 CBGs 121/110/136. Will stop accu checks upon transfer. 7.Ativan PRN. 8.Remove foley  Doree Fudge PA-C 01/04/2012 9:54 AM  Keep in unit to work on [employer] I have seen and examined [name] and agree with the above assessment  and plan.  Delight Ovens MD Beeper [phone] Office [phone] 01/04/2012 12:55 PM

## 2012-01-05 ENCOUNTER — Inpatient Hospital Stay (HOSPITAL_COMMUNITY): Payer: Medicaid Other

## 2012-01-05 LAB — GLUCOSE, CAPILLARY
Glucose-Capillary: 115 mg/dL — ABNORMAL HIGH (ref 70–99)
Glucose-Capillary: 115 mg/dL — ABNORMAL HIGH (ref 70–99)
Glucose-Capillary: 123 mg/dL — ABNORMAL HIGH (ref 70–99)

## 2012-01-05 LAB — BASIC METABOLIC PANEL
BUN: 13 mg/dL (ref 6–23)
CO2: 30 mEq/L (ref 19–32)
Calcium: 9.2 mg/dL (ref 8.4–10.5)
Chloride: 90 mEq/L — ABNORMAL LOW (ref 96–112)
Creatinine, Ser: 0.84 mg/dL (ref 0.50–1.35)
GFR calc Af Amer: >90 mL/min (ref >90)
GFR calc non Af Amer: >90 mL/min (ref >90)
Glucose, Bld: 112 mg/dL — ABNORMAL HIGH (ref 70–99)
Potassium: 4 mEq/L (ref 3.5–5.1)
Sodium: 131 mEq/L — ABNORMAL LOW (ref 135–145)

## 2012-01-05 LAB — CBC
HCT: 36.1 % — ABNORMAL LOW (ref 39.0–52.0)
Hemoglobin: 12.4 g/dL — ABNORMAL LOW (ref 13.0–17.0)
WBC: 13.2 10*3/uL — ABNORMAL HIGH (ref 4.0–10.5)

## 2012-01-05 MED ORDER — GUAIFENESIN-DM 100-10 MG/5ML PO SYRP: 15.0000 mL | ORAL_SOLUTION | ORAL | Status: DC | PRN

## 2012-01-05 MED ORDER — POTASSIUM CHLORIDE CRYS ER 20 MEQ PO TBCR
20.0000 meq | EXTENDED_RELEASE_TABLET | Freq: Two times a day (BID) | ORAL | Status: DC
  Administered 2012-01-05 (×2): 20 meq via ORAL
  Filled 2012-01-05 (×3): qty 1

## 2012-01-05 MED ORDER — MAGNESIUM HYDROXIDE 400 MG/5ML PO SUSP: 30.0000 mL | Freq: Every day | ORAL | Status: DC | PRN

## 2012-01-05 MED ORDER — FUROSEMIDE 40 MG PO TABS
40.0000 mg | ORAL_TABLET | Freq: Every day | ORAL | Status: DC
  Administered 2012-01-05: 40 mg via ORAL
  Filled 2012-01-05: qty 1

## 2012-01-05 MED ORDER — METOPROLOL TARTRATE 50 MG PO TABS
50.0000 mg | ORAL_TABLET | Freq: Two times a day (BID) | ORAL | Status: DC
  Administered 2012-01-05 – 2012-01-07 (×5): 50 mg via ORAL
  Filled 2012-01-05 (×6): qty 1

## 2012-01-05 MED ORDER — SODIUM CHLORIDE 0.9 % IV SOLN: 250.0000 mL | INTRAVENOUS | Status: DC | PRN

## 2012-01-05 MED ORDER — SODIUM CHLORIDE 0.9 % IJ SOLN
3.0000 mL | Freq: Two times a day (BID) | INTRAMUSCULAR | Status: DC
  Administered 2012-01-05 – 2012-01-07 (×5): 3 mL via INTRAVENOUS

## 2012-01-05 MED ORDER — SODIUM CHLORIDE 0.9 % IJ SOLN: 3.0000 mL | INTRAMUSCULAR | Status: DC | PRN

## 2012-01-05 MED ORDER — MOVING RIGHT ALONG BOOK
Freq: Once | Status: DC
  Filled 2012-01-05: qty 1

## 2012-01-05 MED ORDER — ALUM & MAG HYDROXIDE-SIMETH 200-200-20 MG/5ML PO SUSP: 15.0000 mL | ORAL | Status: DC | PRN

## 2012-01-05 MED ORDER — LEVALBUTEROL HCL 0.63 MG/3ML IN NEBU
0.6300 mg | INHALATION_SOLUTION | Freq: Three times a day (TID) | RESPIRATORY_TRACT | Status: DC
  Administered 2012-01-05 – 2012-01-06 (×3): 0.63 mg via RESPIRATORY_TRACT
  Filled 2012-01-05 (×9): qty 3

## 2012-01-05 NOTE — Progress Notes (Signed)
3 Days Post-Op Procedure(s) (LRB): CORONARY ARTERY BYPASS GRAFTING (CABG) (N/A) TRANSESOPHAGEAL ECHOCARDIOGRAM (TEE) (N/A) Subjective: No complaints,   Objective: Vital signs in last 24 hours: Temp:  [98.5 F (36.9 C)-99.6 F (37.6 C)] 98.5 F (36.9 C) (09/14 0711) Pulse Rate:  [96-116] 106  (09/14 0700) Cardiac Rhythm:  [-] Sinus tachycardia (09/13 2000) Resp:  [14-25] 19  (09/14 0700) BP: (112-162)/(77-137) 115/98 mmHg (09/14 0700) SpO2:  [90 %-98 %] 96 % (09/14 0700) Weight:  [224 lb 3.3 oz (101.7 kg)] 224 lb 3.3 oz (101.7 kg) (09/14 0600)  Hemodynamic parameters for last 24 hours:    Intake/Output from previous day: 09/13 0701 - 09/14 0700 In: 1204 [P.O.:660; I.V.:490; IV Piggyback:54] Out: 1930 [Urine:1930] Intake/Output this shift:    General appearance: alert and no distress Neurologic: intact Heart: regular rate and rhythm Lungs: diminished breath sounds bilaterally Abdomen: normal findings: soft, non-tender  Lab Results:  Basename 01/05/12 0330 01/04/12 0415  WBC 13.2* 13.5*  HGB 12.4* 12.6*  HCT 36.1* 37.4*  PLT 142* 125*   BMET:  Basename 01/05/12 0330 01/04/12 0415  NA 131* 133*  K 4.0 4.2  CL 90* 94*  CO2 30 30  GLUCOSE 112* 132*  BUN 13 9  CREATININE 0.84 0.95  CALCIUM 9.2 9.1    PT/INR:  Basename 01/02/12 1440  LABPROT 14.6  INR 1.12   ABG    Component Value Date/Time   PHART 7.370 01/02/2012 1856   HCO3 25.3* 01/02/2012 1856   TCO2 28 01/03/2012 1652   ACIDBASEDEF 1.0 01/02/2012 1319   O2SAT 92.0 01/02/2012 1856   CBG (last 3)   Basename 01/05/12 0710 01/04/12 1751 01/04/12 1213  GLUCAP 115* 132* 122*    Assessment/Plan: S/P Procedure(s) (LRB): CORONARY ARTERY BYPASS GRAFTING (CABG) (N/A) TRANSESOPHAGEAL ECHOCARDIOGRAM (TEE) (N/A) Plan for transfer to step-down: see transfer orders CV- s/p CABG, stable RESP- COPD, breathing comfortably, continue pulmonary hygiene RENAL- continue diuresis CBG well controlled    LOS: 5 days     [name] C 01/05/2012

## 2012-01-05 NOTE — Progress Notes (Signed)
Pt tx to 2009, ambulated length of tx tolerating fair. VSS except slight increase of HR from baseline, returned to baseline after tx. Rcvd by RN.

## 2012-01-06 ENCOUNTER — Inpatient Hospital Stay (HOSPITAL_COMMUNITY): Payer: Medicaid Other

## 2012-01-06 LAB — BASIC METABOLIC PANEL
Calcium: 9.3 mg/dL (ref 8.4–10.5)
GFR calc Af Amer: >90 mL/min (ref >90)
GFR calc non Af Amer: >90 mL/min (ref >90)
Potassium: 4.1 mEq/L (ref 3.5–5.1)
Sodium: 129 mEq/L — ABNORMAL LOW (ref 135–145)

## 2012-01-06 LAB — CBC
Hemoglobin: 11.7 g/dL — ABNORMAL LOW (ref 13.0–17.0)
MCHC: 34.7 g/dL (ref 30.0–36.0)
Platelets: 195 10*3/uL (ref 150–400)
RDW: 13.4 % (ref 11.5–15.5)

## 2012-01-06 LAB — GLUCOSE, CAPILLARY: Glucose-Capillary: 112 mg/dL — ABNORMAL HIGH (ref 70–99)

## 2012-01-06 MED ORDER — OXYCODONE HCL 5 MG PO TABS: 5.0000 mg | ORAL_TABLET | ORAL | Status: AC | PRN

## 2012-01-06 MED ORDER — METOPROLOL TARTRATE 50 MG PO TABS: 50.0000 mg | ORAL_TABLET | Freq: Two times a day (BID) | ORAL | Status: DC

## 2012-01-06 MED ORDER — ALPRAZOLAM 0.25 MG PO TABS: 0.2500 mg | ORAL_TABLET | Freq: Three times a day (TID) | ORAL | Status: DC | PRN

## 2012-01-06 MED ORDER — NICOTINE 21 MG/24HR TD PT24
21.0000 mg | MEDICATED_PATCH | Freq: Every day | TRANSDERMAL | Status: DC
  Filled 2012-01-06: qty 1

## 2012-01-06 MED ORDER — NICOTINE 21 MG/24HR TD PT24: 1.0000 | MEDICATED_PATCH | Freq: Every day | TRANSDERMAL | Status: DC

## 2012-01-06 MED ORDER — ASPIRIN 325 MG PO TBEC: 325.0000 mg | DELAYED_RELEASE_TABLET | Freq: Every day | ORAL | Status: DC

## 2012-01-06 NOTE — Discharge Summary (Addendum)
[address]            [place] [address]          [phone]      [name] February 07, 1961 51 y.o. [id number]  12/31/2011   [name] Ovens, MD  Chest pain [786.50] cp chest pain CAD  History of Presenting Illness:  This is 51 yo Caucasian male with history of CAD (s/p PTCA with a stent to the proximal LAD and 2 stents to the mid LAD, MI in 1999), hypertension, hyperlipidemia, tobacco abuse, and COPD who is transferred from [facility] ED where he presented yesterday with complaints of chest pain. Apparently, he was watching TV 12/31/2011 and began to have left sided chest pain around 6:45 am. This was associated with mild nausea. He denied any shortness of breath. The chest pain persisted and he presented to [facility] Emergency Room for further evaluation and treatment. His chest pain resolved at [facility] ED with administration of NTG, morphine and heparin. He was then transferred to [facility] for cardiac catheterization and further management.He has been chest pain free since arriving here.His last cardiac catheterization was done in 2002. At that time, he had balloon angioplasty of a severe in-stent restenosis in the mid LAD stent ( by Dr. Juanda Chance). He has continued to smoke two packs per day over the last 13 years and has had minimal cardiac follow-up with non-compliance with cardiac meds secondary to financial reason.he was admitted for further evaluation and treatment to include cardiac catheterization.   Past Medical History   Diagnosis  Date   .  Coronary artery disease  2002     Lesion in LAD s/p angioplasty   .  Hypertension    .  Hyperlipidemia    .  Myocardial infarct    .  Tobacco abuse    .  COPD (chronic obstructive pulmonary disease)     Past Surgical History   Procedure  Date   .  Coronary angioplasty with stent placement  11/1997, 11/2000   .  Knee surgery      left x 2    Patient works part time in [employer],  turned down for disability  Daughter 1 yo birthday tomorrow, one son is 11  Family History   Problem  Mother is alive at age 47. She has a history of hypertension, stroke, and hyperlipidemia  Father is alive and may have a history of heart disease     Social History  History   Substance Use Topics   .  Smoking status:  Current Everyday Smoker -- 2.0 packs/day for 35 years     Types:  Cigarettes   .  Smokeless tobacco:  Never Used   .  Alcohol Use:  Quit drinking      Hospital Course: The patient was admitted for further evaluation and treatment. He underwent cardiac catheterization and was found to have severe coronary artery disease. He was referred in cardiothoracic surgical consultation to Sheliah Plane M.D. Dr. Tyrone Sage evaluated the patient and his studies and agreed with recommendations for high-risk coronary surgical artery revascularization. On 01/02/2012 he was taken the operating room at which time he underwent the following procedure: PATIENT: [name] 51 y.o. male  PRE-OPERATIVE DIAGNOSIS: History of Coronary artery disease (s/p MI 02',PTCA with stents to LAD)  POST-OPERATIVE DIAGNOSIS: History of Coronary artery disease (s/p  MI 02',PTCA with stents to LAD)  PROCEDURE: CORONARY ARTERY BYPASS GRAFTING (CABG) x 4 (LIMA to LAD, SVG sequentially to Circumflex and OM, SVG to Acute Marginal) with EVH from the right thigh and lower leg  SURGEON: Surgeon(s) and Role:  * [name] Ovens, MD - Primary  PHYSICIAN ASSISTANT: Doree Fudge PA-C  ANESTHESIA: general  EBL: Total I/O  In: -  Out: 415 [Urine:415] He tolerated the procedure well was taken to the surgical intensive care he in stable condition.  Postoperative hospital course:  Overall the patient has progressed nicely. He was weaned from the ventilator without significant difficulty. All routine lines, monitors and drainage devices have been discontinued in the standard fashion. He does have a moderate  postoperative volume overload but has responded well to diuretics. He has a acute expected blood loss anemia which is stable. He did have a postoperative thrombocytopenia and values have steadily improved into the normal range. He is require aggressive pulmonary toilet but is responding well on oxygen has been weaned to room air . He is tolerating gradually increasing activities using standard protocols. He had some postoperative tachycardia but no significant dysrhythmias and beta blocker has been up titrated. He has had some symptoms as well of nicotine withdrawal with anxiety and agitation. He has been started on a nicotine patch and his his intention to not smoke again. Incisions are healing well without evidence of infection. Currently his status is felt to be tentatively stable for discharge in the next 24-48 hours pending ongoing reevaluation of his recovery.   Basename 01/06/12 0435 01/05/12 0330  NA 129* 131*  K 4.1 4.0  CL 89* 90*  CO2 28 30  GLUCOSE 101* 112*  BUN 16 13  CALCIUM 9.3 9.2    Basename 01/06/12 0435 01/05/12 0330  WBC 9.9 13.2*  HGB 11.7* 12.4*  HCT 33.7* 36.1*  PLT 195 142*   No results found for this basename: INR:2 in the last 72 hours   Discharge Instructions:  The patient is discharged to home with extensive instructions on wound care and progressive ambulation.  They are instructed not to drive or perform any heavy lifting until returning to see the physician in his office.  Discharge Diagnosis:  Chest pain [786.50] cp chest pain CAD  Secondary Diagnosis: Patient Active Problem List  Diagnosis  . HYPERTENSION, BENIGN  . OLD MYOCARDIAL INFARCTION  . CAD, NATIVE VESSEL  . COUGH  . TIA  . Mixed hypercholesterolemia and hypertriglyceridemia  . Dyspnea on exertion  . Tobacco abuse  . COPD (chronic obstructive pulmonary disease)  . Unstable angina   Past Medical History  Diagnosis Date  . Coronary artery disease 2002    Lesion in LAD s/p  angioplasty  . Hypertension   . Hyperlipidemia   . Myocardial infarct   . Tobacco abuse   . COPD (chronic obstructive pulmonary disease)      Discharge Medication List     As of 01/07/2012 11:16 AM    STOP taking these medications         aspirin 81 MG tablet      ibuprofen 200 MG tablet   Commonly known as: ADVIL,MOTRIN      metoprolol succinate 100 MG 24 hr tablet   Commonly known as: TOPROL-XL      nitroGLYCERIN 0.4 MG SL tablet   Commonly known as: NITROSTAT      TAKE these medications         amLODipine 5 MG tablet  Commonly known as: NORVASC   Take 1 tablet (5 mg total) by mouth daily.      aspirin 325 MG EC tablet   Take 1 tablet (325 mg total) by mouth daily.      metoprolol 50 MG tablet   Commonly known as: LOPRESSOR   Take 1 tablet (50 mg total) by mouth 2 (two) times daily.      nicotine 21 mg/24hr patch   Commonly known as: NICODERM CQ - dosed in mg/24 hours   Place 1 patch onto the skin daily.      oxyCODONE 5 MG immediate release tablet   Commonly known as: Oxy IR/ROXICODONE   Take 1-2 tablets (5-10 mg total) by mouth every 4 (four) hours as needed.      simvastatin 20 MG tablet   Commonly known as: ZOCOR   Take 1 tablet (20 mg total) by mouth at bedtime.             Gedalia, Mcmillon  Home Medication Instructions [id number]   Printed on:01/06/12 1158  Medication Information                    simvastatin (ZOCOR) 20 MG tablet Take 1 tablet (20 mg total) by mouth at bedtime.           aspirin EC 325 MG EC tablet Take 1 tablet (325 mg total) by mouth daily.           metoprolol (LOPRESSOR) 50 MG tablet Take 1 tablet (50 mg total) by mouth 2 (two) times daily.           nicotine (NICODERM CQ - DOSED IN MG/24 HOURS) 21 mg/24hr patch Place 1 patch onto the skin daily.           oxyCODONE (OXY IR/ROXICODONE) 5 MG immediate release tablet Take 1-2 tablets (5-10 mg total) by mouth every 4 (four) hours as needed.              Disposition: Her discharge home  Patient's condition is Good  Gershon Crane, PA-C 01/06/2012  11:58 AM

## 2012-01-06 NOTE — Progress Notes (Signed)
Patient ambulated 350 ft x 1 assist. Tolerated well, no resting stops.

## 2012-01-06 NOTE — Progress Notes (Signed)
EPW discontinued per order and protocol. Patient tolerated well, advised 1 hour of bedrest.

## 2012-01-06 NOTE — Progress Notes (Addendum)
[address]            [place] [address]          [phone]     4 Days Post-Op  Procedure(s) (LRB): CORONARY ARTERY BYPASS GRAFTING (CABG) (N/A) TRANSESOPHAGEAL ECHOCARDIOGRAM (TEE) (N/A) Subjective: Agitated and wants to go home  Objective  Telemetry sinus tachy  Temp:  [98.4 F (36.9 C)-98.9 F (37.2 C)] 98.9 F (37.2 C) (09/15 0451) Pulse Rate:  [104-117] 104  (09/15 0451) Resp:  [20-21] 20  (09/15 0451) BP: (111-135)/(76-83) 128/78 mmHg (09/15 0451) SpO2:  [90 %-94 %] 94 % (09/15 0803) Weight:  [221 lb 1.6 oz (100.29 kg)] 221 lb 1.6 oz (100.29 kg) (09/15 0451)   Intake/Output Summary (Last 24 hours) at 01/06/12 0839 Last data filed at 01/05/12 1715  Gross per 24 hour  Intake    300 ml  Output      0 ml  Net    300 ml       General appearance: alert and no distress Heart: regular rate and rhythm, S1, S2 normal and tachy Lungs: min dim in bases Abdomen: benign Extremities: min edema Wound: incisions healing well  Lab Results:  Basename 01/06/12 0435 01/05/12 0330 01/03/12 1650  NA 129* 131* --  K 4.1 4.0 --  CL 89* 90* --  CO2 28 30 --  GLUCOSE 101* 112* --  BUN 16 13 --  CREATININE 0.84 0.84 --  CALCIUM 9.3 9.2 --  MG -- -- 2.2  PHOS -- -- --   No results found for this basename: AST:2,ALT:2,ALKPHOS:2,BILITOT:2,PROT:2,ALBUMIN:2 in the last 72 hours No results found for this basename: LIPASE:2,AMYLASE:2 in the last 72 hours  Basename 01/06/12 0435 01/05/12 0330  WBC 9.9 13.2*  NEUTROABS -- --  HGB 11.7* 12.4*  HCT 33.7* 36.1*  MCV 89.2 89.4  PLT 195 142*   No results found for this basename: CKTOTAL:4,CKMB:4,TROPONINI:4 in the last 72 hours No components found with this basename: POCBNP:3 No results found for this basename: DDIMER in the last 72 hours No results found for this basename: HGBA1C in the last 72 hours No results found for this basename: CHOL,HDL,LDLCALC,TRIG,CHOLHDL in the last 72 hours No  results found for this basename: TSH,T4TOTAL,FREET3,T3FREE,THYROIDAB in the last 72 hours No results found for this basename: VITAMINB12,FOLATE,FERRITIN,TIBC,IRON,RETICCTPCT in the last 72 hours  Medications: Scheduled    . acetaminophen  1,000 mg Oral Q6H   Or  . acetaminophen (TYLENOL) oral liquid 160 mg/5 mL  975 mg Per Tube Q6H  . aspirin EC  325 mg Oral Daily   Or  . aspirin  324 mg Per Tube Daily  . atorvastatin  40 mg Oral q1800  . bisacodyl  10 mg Oral Daily   Or  . bisacodyl  10 mg Rectal Daily  . docusate sodium  200 mg Oral Daily  . furosemide  40 mg Oral Daily  . guaiFENesin  600 mg Oral BID  . insulin aspart  0-24 Units Subcutaneous TID AC  . levalbuterol  0.63 mg Nebulization TID  . metoprolol tartrate  50 mg Oral BID  . moving right along book   Does not apply Once  . pantoprazole  40 mg Oral Q1200  . potassium chloride  20 mEq Oral BID  . sodium chloride  3 mL Intravenous Q12H  . DISCONTD: levalbuterol  0.63 mg Nebulization Q6H  . DISCONTD:  sodium chloride  3 mL Intravenous Q12H     Radiology/Studies:  Dg Chest Port 1 View  01/05/2012  *RADIOLOGY REPORT*  Clinical Data: Postop  PORTABLE CHEST - 1 VIEW  Comparison: Yesterday  Findings: Left internal jugular introducer sheath stable.  Low volumes.  Bibasilar atelectasis stable.  Normal heart size.  No pneumothorax.  IMPRESSION: Stable bibasilar atelectasis.   Original Report Authenticated By: Donavan Burnet, M.D.     INR: Will add last result for INR, ABG once components are confirmed Will add last 4 CBG results once components are confirmed  Assessment/Plan: S/P Procedure(s) (LRB): CORONARY ARTERY BYPASS GRAFTING (CABG) (N/A) TRANSESOPHAGEAL ECHOCARDIOGRAM (TEE) (N/A)  1. monitor HR- may have to increase Beta blocker 2 cont pulm toilet/rehab 3 hyponatremia- monitor,  I think we can stop lasix at this time 4 poss home 1-2 days    LOS: 6 days    GOLD,WAYNE E 9/15/20138:39 AM   patient seen and  examined, agree with above Agitated this AM, likely nicotine withdrawal He's now agreeable to staying until tomorrow Will add xanax

## 2012-01-07 LAB — BASIC METABOLIC PANEL
CO2: 32 mEq/L (ref 19–32)
Calcium: 9.1 mg/dL (ref 8.4–10.5)
Chloride: 90 mEq/L — ABNORMAL LOW (ref 96–112)
Creatinine, Ser: 0.91 mg/dL (ref 0.50–1.35)
Glucose, Bld: 101 mg/dL — ABNORMAL HIGH (ref 70–99)

## 2012-01-07 LAB — GLUCOSE, CAPILLARY: Glucose-Capillary: 102 mg/dL — ABNORMAL HIGH (ref 70–99)

## 2012-01-07 MED ORDER — AMLODIPINE BESYLATE 2.5 MG PO TABS: 5.0000 mg | ORAL_TABLET | Freq: Every day | ORAL | Status: DC

## 2012-01-07 MED ORDER — AMLODIPINE BESYLATE 5 MG PO TABS: 5.0000 mg | ORAL_TABLET | Freq: Every day | ORAL | Status: DC

## 2012-01-07 MED ORDER — POTASSIUM CHLORIDE CRYS ER 20 MEQ PO TBCR: 20.0000 meq | EXTENDED_RELEASE_TABLET | Freq: Once | ORAL | Status: DC

## 2012-01-07 MED ORDER — ASPIRIN 325 MG PO TBEC: 325.0000 mg | DELAYED_RELEASE_TABLET | Freq: Every day | ORAL | Status: DC

## 2012-01-07 MED ORDER — LEVALBUTEROL HCL 0.63 MG/3ML IN NEBU
0.6300 mg | INHALATION_SOLUTION | Freq: Four times a day (QID) | RESPIRATORY_TRACT | Status: DC | PRN
  Filled 2012-01-07: qty 3

## 2012-01-07 MED FILL — Sodium Chloride Irrigation Soln 0.9%: Qty: 3000 | Status: AC

## 2012-01-07 MED FILL — Heparin Sodium (Porcine) Inj 1000 Unit/ML: INTRAMUSCULAR | Qty: 30 | Status: AC

## 2012-01-07 MED FILL — Heparin Sodium (Porcine) Inj 1000 Unit/ML: INTRAMUSCULAR | Qty: 10 | Status: AC

## 2012-01-07 MED FILL — Sodium Chloride IV Soln 0.9%: INTRAVENOUS | Qty: 1000 | Status: AC

## 2012-01-07 MED FILL — Mannitol IV Soln 20%: INTRAVENOUS | Qty: 500 | Status: AC

## 2012-01-07 MED FILL — Electrolyte-R (PH 7.4) Solution: INTRAVENOUS | Qty: 4000 | Status: AC

## 2012-01-07 MED FILL — Sodium Bicarbonate IV Soln 8.4%: INTRAVENOUS | Qty: 50 | Status: AC

## 2012-01-07 MED FILL — Lidocaine HCl IV Inj 20 MG/ML: INTRAVENOUS | Qty: 5 | Status: AC

## 2012-01-07 NOTE — Progress Notes (Signed)
5 Days Post-Op Procedure(s) (LRB): CORONARY ARTERY BYPASS GRAFTING (CABG) (N/A) TRANSESOPHAGEAL ECHOCARDIOGRAM (TEE) (N/A)  Subjective: Patient with bowel movement this am. Wants to go home now.  Objective: Vital signs in last 24 hours: Patient Vitals for the past 24 hrs:  BP Temp Temp src Pulse Resp SpO2 Weight  01/07/12 1008 131/75 mmHg - - 86  - - -  01/07/12 0435 118/72 mmHg 98.3 F (36.8 C) Oral 88  19  95 % 220 lb 4.8 oz (99.927 kg)  01/06/12 2025 128/72 mmHg 98.2 F (36.8 C) Oral 95  19  93 % -  01/06/12 1406 116/69 mmHg 97.9 F (36.6 C) Oral 101  20  92 % -  01/06/12 1330 111/75 mmHg - - 93  - - -  01/06/12 1300 108/83 mmHg - - 88  - - -  01/06/12 1230 99/76 mmHg - - 90  - - -  01/06/12 1200 124/85 mmHg - - 104  - - -  01/06/12 1145 120/86 mmHg - - 110  - - -  01/06/12 1130 149/79 mmHg - - 104  - - -  01/06/12 1115 134/79 mmHg - - 108  - - -   Pre op weight  101 kg Current Weight  01/07/12 220 lb 4.8 oz (99.927 kg)      Intake/Output from previous day: 09/15 0701 - 09/16 0700 In: 240 [P.O.:240] Out: -    Physical Exam:  Cardiovascular: RRR, no murmurs, gallops, or rubs. Pulmonary: Clear to auscultation bilaterally; no rales, wheezes, or rhonchi. Abdomen: Soft, non tender, bowel sounds present. Extremities: Trace bilateral lower extremity edema. Wounds: Clean and dry.  No erythema or signs of infection.  Lab Results: CBC: Basename 01/06/12 0435 01/05/12 0330  WBC 9.9 13.2*  HGB 11.7* 12.4*  HCT 33.7* 36.1*  PLT 195 142*   BMET:  Basename 01/07/12 0710 01/06/12 0435  NA 132* 129*  K 3.8 4.1  CL 90* 89*  CO2 32 28  GLUCOSE 101* 101*  BUN 15 16  CREATININE 0.91 0.84  CALCIUM 9.1 9.3    PT/INR:  Lab Results  Component Value Date   INR 1.12 01/02/2012   INR 1.00 12/31/2011   ABG:  INR: Will add last result for INR, ABG once components are confirmed Will add last 4 CBG results once components are confirmed  Assessment/Plan:  1. CV -  ST/SR.Continue Lopressor 50 bid. 2.  Pulmonary - Encourage incentive spirometer 3.  Acute blood loss anemia - Last H and H 11.7 and 33.7 4.Supplement potassium. 5.Remove chest tube sutures 6.Discharge home.   ZIMMERMAN,DONIELLE MPA-C 01/07/2012

## 2012-01-07 NOTE — Progress Notes (Signed)
[phone] Cardiac Rehab Pt up walking independently denies any problems, just wants to go home. Completed discharge education with pt. He agrees to [employer]. CRP in [place], will send referral. Discussed smoking cessation with pt, states that he is done. Gave pt tips for quitting and contact numbers for coaching lines.Put recovery from [facility] video for pt to watch.

## 2012-01-07 NOTE — Op Note (Signed)
[name], [name] NO.:  [id number]  MEDICAL RECORD NO.:  [id number]  LOCATION:  2009                         FACILITY:  [facility]  PHYSICIAN:  Sheliah Plane, MD    DATE OF BIRTH:  Jan 14, 1961  DATE OF PROCEDURE:  01/02/2012 DATE OF DISCHARGE:                              OPERATIVE REPORT   PREOPERATIVE DIAGNOSIS:  Pre-infarctional angina with three-vessel coronary artery disease.  POSTOPERATIVE DIAGNOSIS:  Pre-infarctional angina with three-vessel coronary artery disease.  SURGICAL PROCEDURE:  Coronary artery bypass grafting x4 with the left internal mammary to the left anterior descending coronary artery, reverse saphenous vein graft to the acute marginal coronary artery, sequential reverse saphenous vein graft to the first and second obtuse marginals with right leg endovein harvesting.  SURGEON:  Sheliah Plane, MD  FIRST ASSISTANT:  Doree Fudge, PA  BRIEF HISTORY:  The patient is a 51 year old male with known coronary occlusive disease, having had stents placed in 1990s.  He had little followup care since that time and continued to smoke on a daily basis. He now presents with recurrent chest pain at rest.  He was admitted by Dr. Jens [name] and underwent cardiac catheterization by Dr. Riley [name], which revealed a proximally occluded right coronary artery with no distal filling.  High-grade stenosis in the stented area of his LAD and proximal and midcircumflex disease of over 80% with two moderate-sized obtuse marginal vessels are graftable.  The patient's sole supplier of the left hand was the radial artery, so radial artery grafting was not done.  The risks and options of surgery was discussed with the patient in detail and he was willing to proceed and signed informed consent.  DESCRIPTION OF PROCEDURE:  With Swan-Ganz and arterial line monitors in place, the patient underwent general endotracheal anesthesia without incident.  The skin of the  chest and legs was prepped with Betadine and draped in usual sterile manner.  TEE probe was placed by Dr. Kelly [name] and showed preserved LV function, ejection fraction greater than 40% without valvular disease.  The skin of the chest and legs were prepped with Betadine and draped in usual sterile manner.  Using the [employer] endovein harvesting system, vein was harvested from the right thigh and calf and was of good quality.  Median sternotomy was performed.  Left internal mammary artery was dissected down as a pedicle graft.  Distal artery was divided, had good free flow.  Pericardium was opened to overall ventricular function, appeared preserved.  The patient was systemically heparinized.  The ascending aorta was cannulated.  The right atrium was cannulated.  An aortic root vent cardioplegia needle was introduced into the ascending aorta.  The patient was placed on cardiopulmonary bypass, 2.4 liters/minute/meter square.  Sites of anastomosis were selected and dissected out of the epicardium.  The patient's body temperature was cooled to 32 degrees.  Preoperative films did not show the distal right coronary artery.  The large branch of the right system was of moderate size, acute marginal vessel.  The distal right coronary was a very diminutive vessel.  Aortic crossclamp was applied, 500 mL of cold blood potassium cardioplegia was administered with diastolic arrest of the heart.  Myocardial septal  temperature was monitored throughout the crossclamp.  Attention was turned first to the acute marginal vessel, which was opened and admitted a 1.5-mm probe.  Using a short segment of vein and 7-0 Prolene, a distal anastomosis was performed with the acute marginal.  The heart was then elevated and the first and second obtuse marginals were each identified and were moderate-sized vessels.  The first obtuse margin was opened and admitted a 1.5-mm probe.  Using the diamond type side-to-side anastomosis  with a running 7-0 Prolene, distal anastomosis was performed.  Distal extent of the same vein was then carried a short distance to the second obtuse marginal, which was in a similar fashion opened and admitted a 1.5-mm probe.  Using a running 7-0 Prolene, distal anastomosis was performed.  Additional cold blood cardioplegia was administered down the vein graft.  Attention was then turned to the left anterior descending coronary artery.  The vessel was opened and admitted a 1.5-mm probe distally.  Using a running 8-0 Prolene, left internal mammary artery was anastomosed to the left anterior descending coronary artery.  With release of the bulldog on the mammary artery, the myocardial septal temperature rose, but not as quickly as expected.  Although the anastomosis appeared intact, we decided to place the bulldog back on the mammary artery, take anastomosis down, retiring the end of the mammary artery and repeated the mammary anastomosis.  With the removal of the bulldog at this point, there was prompt rise in myocardial septal temperature.  Bulldog was placed back on the mammary artery.  Then, we proceeded to the proximal anastomosis.  With the crossclamp still in place, two punch aortotomies were performed and each of the two vein grafts were anastomosed to the ascending aorta.  Air was evacuated.  The bulldog from the mammary artery was again removed with prompt rise in the myocardial septal temperature.  The heart was allowed to passively fill and de-aired. Aortotomies were completed and the aortic crossclamp was removed with total crossclamp time of 111 minutes.  The patient was spontaneously converted to a sinus rhythm.  He was atrially paced temporarily to increase rate.  Sites of anastomosis were inspected with free of bleeding.  He was then ventilated and weaned from cardiopulmonary bypass without difficulty remaining hemodynamically stable, was decannulated in usual fashion.   Protamine sulfate was administered with operative field hemostatic.  A left pleural tube and a Blake mediastinal drain were left in place.  Pericardium was loosely reapproximated.  Sternum was closed with #6 stainless steel wire.  Fascia was closed with interrupted 0 Vicryl, running 3-0 Vicryl in the subcutaneous tissue and 4-0 subcuticular stitch in the skin edges.  Dry dressings were applied. Sponge and needle count were reported as correct at the completion of procedure.  The patient tolerated the procedure without obvious complication and was transferred to the Surgical Intensive Care Unit for further postop care.  Total pump time was 140 minutes.  The patient did not require any blood products during the procedure.     Sheliah Plane, MD     EG/MEDQ  D:  01/06/2012  T:  01/07/2012  Job:  [id number]

## 2012-01-08 ENCOUNTER — Other Ambulatory Visit: Payer: Self-pay | Admitting: *Deleted

## 2012-01-08 DIAGNOSIS — G4700 Insomnia, unspecified: Secondary | ICD-10-CM

## 2012-01-08 MED ORDER — ALPRAZOLAM 0.25 MG PO TABS: 0.2500 mg | ORAL_TABLET | Freq: Every evening | ORAL | Status: DC | PRN

## 2012-01-15 ENCOUNTER — Other Ambulatory Visit: Payer: Self-pay | Admitting: *Deleted

## 2012-01-15 DIAGNOSIS — G8918 Other acute postprocedural pain: Secondary | ICD-10-CM

## 2012-01-15 MED ORDER — HYDROCODONE-ACETAMINOPHEN 7.5-500 MG PO TABS: 1.0000 | ORAL_TABLET | ORAL | Status: DC | PRN

## 2012-01-21 ENCOUNTER — Telehealth: Payer: Self-pay | Admitting: *Deleted

## 2012-01-21 ENCOUNTER — Ambulatory Visit (INDEPENDENT_AMBULATORY_CARE_PROVIDER_SITE_OTHER): Payer: PRIVATE HEALTH INSURANCE | Admitting: Nurse Practitioner

## 2012-01-21 ENCOUNTER — Encounter: Payer: Self-pay | Admitting: Nurse Practitioner

## 2012-01-21 ENCOUNTER — Other Ambulatory Visit: Payer: Self-pay | Admitting: *Deleted

## 2012-01-21 VITALS — BP 98/62 | HR 74 | Ht 70.0 in | Wt 215.8 lb

## 2012-01-21 DIAGNOSIS — G8918 Other acute postprocedural pain: Secondary | ICD-10-CM

## 2012-01-21 DIAGNOSIS — Z951 Presence of aortocoronary bypass graft: Secondary | ICD-10-CM

## 2012-01-21 LAB — CBC WITH DIFFERENTIAL/PLATELET
Basophils Absolute: 0.1 10*3/uL (ref 0.0–0.1)
Basophils Relative: 0.8 % (ref 0.0–3.0)
Eosinophils Absolute: 0.2 10*3/uL (ref 0.0–0.7)
Eosinophils Relative: 2.5 % (ref 0.0–5.0)
HCT: 33.6 % — ABNORMAL LOW (ref 39.0–52.0)
Hemoglobin: 11.3 g/dL — ABNORMAL LOW (ref 13.0–17.0)
Lymphocytes Relative: 31 % (ref 12.0–46.0)
Lymphs Abs: 2.2 10*3/uL (ref 0.7–4.0)
MCHC: 33.5 g/dL (ref 30.0–36.0)
MCV: 87.1 fl (ref 78.0–100.0)
Monocytes Absolute: 0.4 10*3/uL (ref 0.1–1.0)
Monocytes Relative: 6.1 % (ref 3.0–12.0)
Neutro Abs: 4.3 10*3/uL (ref 1.4–7.7)
Neutrophils Relative %: 59.6 % (ref 43.0–77.0)
Platelets: 356 10*3/uL (ref 150.0–400.0)
RBC: 3.86 Mil/uL — ABNORMAL LOW (ref 4.22–5.81)
RDW: 14.4 % (ref 11.5–14.6)
WBC: 7.2 10*3/uL (ref 4.5–10.5)

## 2012-01-21 LAB — BASIC METABOLIC PANEL
BUN: 11 mg/dL (ref 6–23)
CO2: 28 mEq/L (ref 19–32)
Calcium: 8.8 mg/dL (ref 8.4–10.5)
Chloride: 98 mEq/L (ref 96–112)
Creatinine, Ser: 1 mg/dL (ref 0.4–1.5)
GFR: 80.22 mL/min (ref >60.00)
Glucose, Bld: 79 mg/dL (ref 70–99)
Potassium: 4.4 mEq/L (ref 3.5–5.1)
Sodium: 133 mEq/L — ABNORMAL LOW (ref 135–145)

## 2012-01-21 MED ORDER — HYDROCODONE-ACETAMINOPHEN 7.5-500 MG PO TABS: 1.0000 | ORAL_TABLET | ORAL | Status: DC | PRN

## 2012-01-21 NOTE — Telephone Encounter (Signed)
Pts spouse returned my call regarding labs.  Informed of lab results. Vista Mink, CMA

## 2012-01-21 NOTE — Progress Notes (Signed)
Ellard Artis Date of Birth: 1960-07-09 Medical Record #[id number]  History of Present Illness: Mr. [name] is seen back today for a post hospital visit. He is seen for Dr. Riley Kill. He has a long standing history of CAD with prior PTCA/stent to proximal LAD and 2 stents to mid LAD, prior MI in 1999 and now s/p CABG x 4. His other problems include HTN, HLD, tobacco abuse and COPD.   He comes in today. He is here with his wife. He is doing ok. He is not smoking and stopped cold [place]. Did not need the nicotine patches. Walking some. Appetite is ok. Bowels are working ok. He is not lightheaded or dizzy. Does not check his blood pressure at home. Not short of breath. Not coughing. No fever or chills. Says he is already feeling better.   Current Outpatient Prescriptions on File Prior to Visit  Medication Sig Dispense Refill  . ALPRAZolam (XANAX) 0.25 MG tablet Take 1 tablet (0.25 mg total) by mouth at bedtime as needed for sleep.  30 tablet  0  . amLODipine (NORVASC) 5 MG tablet Take 1 tablet (5 mg total) by mouth daily.  30 tablet  1  . aspirin 325 MG EC tablet Take 1 tablet (325 mg total) by mouth daily.      Marland Kitchen HYDROcodone-acetaminophen (LORTAB 7.5) 7.5-500 MG per tablet Take 1 tablet by mouth every 4 (four) hours as needed for pain (may take one or two tablets every 4-6 hrs prn).  40 tablet  0  . metoprolol (LOPRESSOR) 50 MG tablet Take 1 tablet (50 mg total) by mouth 2 (two) times daily.  60 tablet  1  . simvastatin (ZOCOR) 20 MG tablet Take 1 tablet (20 mg total) by mouth at bedtime.  30 tablet  1    Allergies  Allergen Reactions  . Lisinopril Cough  . Tea Hives    Past Medical History  Diagnosis Date  . Coronary artery disease 2002    Lesion in LAD s/p angioplasty; s/p CABG x 4 Sept 2013  . Hypertension   . Hyperlipidemia   . Myocardial infarct   . Tobacco abuse     stopped September 2013  . COPD (chronic obstructive pulmonary disease)   . Poor dentition     Past Surgical  History  Procedure Date  . Coronary angioplasty with stent placement 11/1997, 11/2000  . Knee surgery     left x 2  . Coronary artery bypass graft 01/02/2012    Procedure: CORONARY ARTERY BYPASS GRAFTING (CABG);  Surgeon: Delight Ovens, MD;  Location: [facility] OR;  Service: Open Heart Surgery;  Laterality: N/A;  times three using left internal mammary artery and right endoscopically harvested saphenous vein  . Tee without cardioversion 01/02/2012    Procedure: TRANSESOPHAGEAL ECHOCARDIOGRAM (TEE);  Surgeon: Delight Ovens, MD;  Location: [facility] OR;  Service: Open Heart Surgery;  Laterality: N/A;    History  Smoking status  . Former Smoker -- 2.0 packs/day for 35 years  . Types: Cigarettes  . Quit date: 12/21/2011  Smokeless tobacco  . Never Used    History  Alcohol Use No    Family History  Problem Relation Age of Onset  . Cancer Neg Hx   . Heart disease Father     Review of Systems: The review of systems is per the HPI.  All other systems were reviewed and are negative.  Physical Exam: BP 98/62  Pulse 74  Ht 5\' 10"  (1.778 m)  Wt  215 lb 12.8 oz (97.886 kg)  BMI 30.96 kg/m2 Patient is pleasant and in no acute distress. Skin is warm and dry. Color is normal.  HEENT is unremarkable except for very poor dentition. Normocephalic/atraumatic. PERRL. Sclera are nonicteric. Neck is supple. No masses. No JVD. Lungs are clear. Cardiac exam shows a regular rate and rhythm. Sternum looks ok.  Abdomen is soft. Extremities are without edema. Vein harvesting from the right leg looks ok. Gait and ROM are intact. No gross neurologic deficits noted.   LABORATORY DATA: EKG shows sinus rhythm with anterior T wave changes.    Assessment / Plan: 1. CAD - s/p CABG x 4. EF was normal at time of cath. He seems to be making good progress. No change in his medicines. Check follow up labs today. See Dr. Riley Kill in about 6 weeks. Will refer to cardiac rehab at [facility].  2. HTN - BP on the lower side.  He is not symptomatic. I have asked him to try and check some readings at home.   3. Tobacco abuse - he has stopped smoking. 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  We will see him back in about 6 weeks. Labs will be checked today. Patient is agreeable to this plan and will call if any problems develop in the interim.

## 2012-01-21 NOTE — Telephone Encounter (Signed)
Message copied by Awilda Bill on Mon Jan 21, 2012  1:52 PM ------      Message from: Rosalio Macadamia      Created: Mon Jan 21, 2012  1:19 PM       Ok to report. Labs are satisfactory for post op CABG

## 2012-01-21 NOTE — Telephone Encounter (Signed)
Called patients cell, unable to leave msg. (no voicemail).  Called home # and left msg for patient to return my call regarding labs.   Vista Mink, CMA

## 2012-01-21 NOTE — Patient Instructions (Addendum)
We need to check labs today  We will refer you to cardiac rehab at [facility]  Dr. Riley Kill will see you in about 6 weeks  Call the [facility] Care office at [phone] if you have any questions, problems or concerns.

## 2012-01-25 ENCOUNTER — Other Ambulatory Visit: Payer: Self-pay | Admitting: *Deleted

## 2012-01-25 ENCOUNTER — Telehealth: Payer: Self-pay | Admitting: *Deleted

## 2012-01-25 DIAGNOSIS — K047 Periapical abscess without sinus: Secondary | ICD-10-CM

## 2012-01-25 MED ORDER — PENICILLIN V POTASSIUM 500 MG PO TABS: 500.0000 mg | ORAL_TABLET | Freq: Three times a day (TID) | ORAL | Status: DC

## 2012-01-25 NOTE — Telephone Encounter (Signed)
Mrs. Carchi states that [name] is having trouble with his teeth again. He had an abscess before his CABG and was treated with penicillin by his dentist.  His CABG was performed on 01/02/12. She said he has 4 left from an old prescription, but his dentist's office is closed and no one is on call for her.  I discussed this with Dr. Tyrone Sage.  He will prescribe additional penicillin until [name] can be seen by his dentist.  They have been made aware.

## 2012-01-29 ENCOUNTER — Other Ambulatory Visit: Payer: Self-pay | Admitting: Cardiothoracic Surgery

## 2012-01-29 ENCOUNTER — Other Ambulatory Visit: Payer: Self-pay

## 2012-01-29 DIAGNOSIS — Z951 Presence of aortocoronary bypass graft: Secondary | ICD-10-CM

## 2012-01-31 ENCOUNTER — Encounter: Payer: Self-pay | Admitting: Cardiothoracic Surgery

## 2012-01-31 ENCOUNTER — Ambulatory Visit (INDEPENDENT_AMBULATORY_CARE_PROVIDER_SITE_OTHER): Payer: Self-pay | Admitting: Cardiothoracic Surgery

## 2012-01-31 ENCOUNTER — Ambulatory Visit
Admission: RE | Admit: 2012-01-31 | Discharge: 2012-01-31 | Disposition: A | Discharge status: Home / Self Care | Payer: Medicaid Other | Source: Ambulatory Visit | Attending: Cardiothoracic Surgery | Admitting: Cardiothoracic Surgery

## 2012-01-31 VITALS — BP 128/86 | HR 72 | Resp 18 | Ht 70.0 in | Wt 216.0 lb

## 2012-01-31 DIAGNOSIS — Z951 Presence of aortocoronary bypass graft: Secondary | ICD-10-CM

## 2012-01-31 DIAGNOSIS — I2510 Atherosclerotic heart disease of native coronary artery without angina pectoris: Secondary | ICD-10-CM

## 2012-01-31 NOTE — Progress Notes (Signed)
[address]            [place] [address]          [phone]       [name] [facility] Medical Record #[id number] Date of Birth: 12-09-1960  [name] Abraham, MD [name] Linger, MD  Chief Complaint:   PostOp Follow Up Visit 01/02/2012  DATE OF DISCHARGE:  OPERATIVE REPORT  PREOPERATIVE DIAGNOSIS: Pre-infarctional angina with three-vessel  coronary artery disease.  POSTOPERATIVE DIAGNOSIS: Pre-infarctional angina with three-vessel  coronary artery disease.  SURGICAL PROCEDURE: Coronary artery bypass grafting x4 with the left  internal mammary to the left anterior descending coronary artery,  reverse saphenous vein graft to the acute marginal coronary artery,  sequential reverse saphenous vein graft to the first and second obtuse  marginals with right leg endovein harvesting.    History of Present Illness:     Returns after CABG.He has a long standing history of CAD with prior PTCA/stent to proximal LAD and 2 stents to mid LAD, prior MI in 1999 and now s/p CABG x 4. His other problems include HTN, HLD, tobacco abuse and COPD.  He is not smoking and stopped cold [place]. Walking  some. Appetite is ok. Bowels are working ok. He is not lightheaded or dizzy.  Not short of breath. Not coughing. No fever or chills. Says he is already feeling better.  Breathing better. Some "itching" feeling left chest wall.   History  Smoking status  . Former Smoker -- 2.0 packs/day for 35 years  . Types: Cigarettes  . Quit date: 12/21/2011  Smokeless tobacco  . Never Used       Allergies  Allergen Reactions  . Lisinopril Cough  . Tea Hives    Current Outpatient Prescriptions  Medication Sig Dispense Refill  . ALPRAZolam (XANAX) 0.25 MG tablet Take 1 tablet (0.25 mg total) by mouth at bedtime as needed for sleep.  30 tablet  0  . amLODipine (NORVASC) 5 MG tablet Take 1 tablet (5 mg total) by mouth daily.  30 tablet  1  . aspirin 325 MG EC  tablet Take 1 tablet (325 mg total) by mouth daily.      Marland Kitchen HYDROcodone-acetaminophen (LORTAB 7.5) 7.5-500 MG per tablet Take 1 tablet by mouth every 4 (four) hours as needed for pain (may take one or two tablets every 4-6 hrs prn).  40 tablet  0  . metoprolol (LOPRESSOR) 50 MG tablet Take 1 tablet (50 mg total) by mouth 2 (two) times daily.  60 tablet  1  . penicillin v potassium (VEETID) 500 MG tablet Take 1 tablet (500 mg total) by mouth 3 (three) times daily.  21 tablet  0  . simvastatin (ZOCOR) 20 MG tablet Take 1 tablet (20 mg total) by mouth at bedtime.  30 tablet  1       Physical Exam: BP 128/86  Pulse 72  Resp 18  Ht 5\' 10"  (1.778 m)  Wt 216 lb (97.977 kg)  BMI 30.99 kg/m2  SpO2 98%  General appearance: alert, cooperative and no distress Neurologic: intact Heart: regular rate and rhythm, S1, S2 normal, no murmur, click, rub or gallop and normal apical impulse Lungs: clear to auscultation bilaterally and normal percussion bilaterally Abdomen: soft, non-tender; bowel sounds normal; no masses,  no organomegaly Extremities: extremities normal, atraumatic, no cyanosis or edema and Homans sign is negative,  no sign of DVT Wound: sternum stable   Diagnostic Studies & Laboratory data:         Recent Radiology Findings: Dg Chest 2 View  01/31/2012  *RADIOLOGY REPORT*  Clinical Data: CABG 01/04/2012.  CHEST - 2 VIEW  Comparison: 01/06/2012  Findings: Continued improvement in left lower lobe airspace disease.  Mild amount of residual atelectasis is present in the left lower lobe.  Right lung is clear.  No pleural effusion is identified.  There is no heart failure.  Negative for mass or pneumonia.  IMPRESSION: Near complete clearing of left lower lobe consolidation.   Original Report Authenticated By: Camelia Phenes, M.D.       Recent Labs: Lab Results  Component Value Date   WBC 7.2 01/21/2012   HGB 11.3* 01/21/2012   HCT 33.6* 01/21/2012   PLT 356.0 01/21/2012   GLUCOSE 79  01/21/2012   CHOL 201* 01/01/2012   TRIG 644* 01/01/2012   HDL 22* 01/01/2012   LDLDIRECT 124.8 06/28/2010   LDLCALC UNABLE TO CALCULATE IF TRIGLYCERIDE OVER 400 mg/dL 07/30/8117   ALT 21 04/27/7827   AST 19 12/31/2011   NA 133* 01/21/2012   K 4.4 01/21/2012   CL 98 01/21/2012   CREATININE 1.0 01/21/2012   BUN 11 01/21/2012   CO2 28 01/21/2012   TSH 2.35 04/14/2010   INR 1.12 01/02/2012   HGBA1C 5.3 01/02/2012      Assessment / Plan:      Doing well post op Wants to get disability Needs teeth removed       [name] B 01/31/2012 9:52 AM

## 2012-01-31 NOTE — Patient Instructions (Addendum)
No lifting over 25 lbs for three months [place] drive     Coronary Artery Bypass Grafting Care After Refer to this sheet in the next few weeks. These instructions provide you with information on caring for yourself after your procedure. Your caregiver may also give you more specific instructions. Your treatment has been planned according to current medical practices, but problems sometimes occur. Call your caregiver if you have any problems or questions after your procedure.  Recovery from open heart surgery will be different for everyone. Some people feel well after 3 or 4 weeks, while for others it takes longer. After heart surgery, it may be normal to:  Not have an appetite, feel nauseated by the smell of food, or only want to eat a small amount.  Be constipated because of changes in your diet, activity, and medicines. Eat foods high in fiber. Add fresh fruits and vegetables to your diet. Stool softeners may be helpful.  Feel sad or unhappy. You may be frustrated or cranky. You may have good days and bad days. Do not give up. Talk to your caregiver if you do not feel better.  Feel weakness and fatigue. You many need physical therapy or cardiac rehabilitation to get your strength back.  Develop an irregular heartbeat called atrial fibrillation. Symptoms of atrial fibrillation are a fast, irregular heartbeat or feelings of fluttery heartbeats, shortness of breath, low blood pressure, and dizziness. If these symptoms develop, see your caregiver right away. MEDICATION  Have a list of all the medicines you will be taking when you leave the hospital. For every medicine, know the following:  Name.  Exact dose.  Time of day to be taken.  How often it should be taken.  Why you are taking it.  Ask which medicines should or should not be taken together. If you take more than one heart medicine, ask if it is okay to take them together. Some heart medicines should not be taken at the same time  because they may lower your blood pressure too much.  Narcotic pain medicine can cause constipation. Eat fresh fruits and vegetables. Add fiber to your diet. Stool softener medicine may help relieve constipation.  Keep a copy of your medicines with you at all times.  Do not add or stop taking any medicine until you check with your caregiver.  Medicines can have side effects. Call your caregiver who prescribed the medicine if you:  Start throwing up, have diarrhea, or have stomach pain.  Feel dizzy or lightheaded when you stand up.  Feel your heart is skipping beats or is beating too fast or too slow.  Develop a rash.  Notice unusual bruising or bleeding. HOME CARE INSTRUCTIONS  After heart surgery, it is important to learn how to take your pulse. Have your caregiver show you how to take your pulse.  Use your incentive spirometer. Ask your caregiver how long after surgery you need to use it. Care of your chest incision  Tell your caregiver right away if you notice clicking in your chest (sternum).  Support your chest with a pillow or your arms when you take deep breaths and cough.  Follow your caregiver's instructions about when you can bathe or swim.  Protect your incision from sunlight during the first year to keep the scar from getting dark.  Tell your caregiver if you notice:  Increased tenderness of your incision.  Increased redness or swelling around your incision.  Drainage or pus from your incision. Care of your  leg incision(s)  Avoid crossing your legs.  Avoid sitting for long periods of time. Change positions every half hour.  Elevate your leg(s) when you are sitting.  Check your leg(s) daily for swelling. Check the incisions for redness or drainage.  Wear your elastic stockings as told by your caregiver. Take them off at bedtime. Diet  Diet is very important to heart health.  Eat plenty of fresh fruits and vegetables. Meats should be lean cut. Avoid  canned, processed, and fried foods.  Talk to a dietician. They can teach you how to make healthy food and drink choices. Weight  Weigh yourself every day. This is important because it helps to know if you are retaining fluid that may make your heart and lungs work harder.  Use the same scale each time.  Weigh yourself every morning at the same time. You should do this after you go to the bathroom, but before you eat breakfast.  Your weight will be more accurate if you do not wear any clothes.  Record your weight.  Tell your caregiver if you have gained 2 pounds or more overnight. Activity Stop any activity at once if you have chest pain, shortness of breath, irregular heartbeats, or dizziness. Get help right away if you have any of these symptoms.  Bathing.  Avoid soaking in a bath or hot tub until your incisions are healed.  Rest. You need a balance of rest and activity.  Exercise. Exercise per your caregiver's advice. You may need physical therapy or cardiac rehabilitation to help strengthen your muscles and build your endurance.  Climbing stairs. Unless your caregiver tells you not to climb stairs, go up stairs slowly and rest if you tire. Do not pull yourself up by the handrail.  Driving a car. Follow your caregiver's advice on when you may drive. You may ride as a passenger at any time. When traveling for long periods of time in a car, get out of the car and walk around for a few minutes every 2 hours.  Lifting. Avoid lifting, pushing, or pulling anything heavier than 10 pounds for 6 weeks after surgery or as told by your caregiver.  Returning to work. Check with your caregiver. People heal at different rates. Most people will be able to go back to work 6 to 12 weeks after surgery.  Sexual activity. You may resume sexual relations as told by your caregiver. SEEK MEDICAL CARE IF:  Any of your incisions are red, painful, or have any type of drainage coming from them.  You  have an oral temperature above 102 F (38.9 C).  You have ankle or leg swelling.  You have pain in your legs.  You have weight gain of 2 or more pounds a day.  You feel dizzy or lightheaded when you stand up. SEEK IMMEDIATE MEDICAL CARE IF:  You have angina or chest pain that goes to your jaw or arms. Call your local emergency services right away.  You have shortness of breath at rest or with activity.  You have a fast or irregular heartbeat (arrhythmia).  There is a "clicking" in your sternum when you move.  You have numbness or weakness in your arms or legs. MAKE SURE YOU:  Understand these instructions.  Will watch your condition.  Will get help right away if you are not doing well or get worse. Document Released: 10/27/2004 Document Revised: 07/02/2011 Document Reviewed: 06/14/2010 [facility] Patient Information 2013 [place], [place].

## 2012-02-05 ENCOUNTER — Encounter (HOSPITAL_COMMUNITY): Payer: Medicaid Other

## 2012-02-15 ENCOUNTER — Other Ambulatory Visit: Payer: Self-pay | Admitting: *Deleted

## 2012-02-15 DIAGNOSIS — K047 Periapical abscess without sinus: Secondary | ICD-10-CM

## 2012-02-15 MED ORDER — PENICILLIN V POTASSIUM 500 MG PO TABS: 500.0000 mg | ORAL_TABLET | Freq: Three times a day (TID) | ORAL | Status: DC

## 2012-02-15 NOTE — Telephone Encounter (Signed)
Mrs. Akamine called with concerns that her husband had a recurrence of his abscessed tooth.  His jaw is very swollen again.  Dr. Tyrone Sage had treated this before with the understanding that he would see his regular dentist soon.  At his last office visit, the tooth had 'settled down".  They told Dr. Tyrone Sage that his dentist preferred that he refer him to another dentist that could do the total extractions.  He has been referred to Dr. Kristin Bruins and will see him Monday., 02/18/12.  Due to this recent swelling, his antibiotic has been refilled.  I instructed him to go to the ER if he were to become febrile....he agreed.

## 2012-02-18 ENCOUNTER — Encounter (HOSPITAL_COMMUNITY): Payer: Self-pay | Admitting: Dentistry

## 2012-02-18 ENCOUNTER — Ambulatory Visit (HOSPITAL_COMMUNITY): Payer: Medicaid Other | Admitting: Dentistry

## 2012-02-18 ENCOUNTER — Other Ambulatory Visit (HOSPITAL_COMMUNITY): Payer: Self-pay | Admitting: Dentistry

## 2012-02-18 VITALS — BP 128/78 | HR 86 | Temp 98.4°F

## 2012-02-18 DIAGNOSIS — K045 Chronic apical periodontitis: Secondary | ICD-10-CM

## 2012-02-18 DIAGNOSIS — K029 Dental caries, unspecified: Secondary | ICD-10-CM

## 2012-02-18 DIAGNOSIS — K083 Retained dental root: Secondary | ICD-10-CM

## 2012-02-18 DIAGNOSIS — M270 Developmental disorders of jaws: Secondary | ICD-10-CM

## 2012-02-18 DIAGNOSIS — K0401 Reversible pulpitis: Secondary | ICD-10-CM

## 2012-02-18 DIAGNOSIS — M278 Other specified diseases of jaws: Secondary | ICD-10-CM

## 2012-02-18 DIAGNOSIS — K036 Deposits [accretions] on teeth: Secondary | ICD-10-CM

## 2012-02-18 DIAGNOSIS — M264 Malocclusion, unspecified: Secondary | ICD-10-CM

## 2012-02-18 DIAGNOSIS — K08109 Complete loss of teeth, unspecified cause, unspecified class: Secondary | ICD-10-CM

## 2012-02-18 DIAGNOSIS — K0530 Chronic periodontitis, unspecified: Secondary | ICD-10-CM

## 2012-02-18 DIAGNOSIS — IMO0002 Reserved for concepts with insufficient information to code with codable children: Secondary | ICD-10-CM

## 2012-02-18 NOTE — Patient Instructions (Signed)
Continue taking antibiotic therapy until all gone. Return for scheduled operating room procedure with general anesthesia.

## 2012-02-18 NOTE — H&P (Signed)
02/18/2012  Patient:            [name] Date of Birth:  [birth year] MRN:                [id number]  See Dr. Dennie Maizes note of 01/31/2012 below for use as history and physical exam for dental operating room procedure on 02/21/2012.  Charlynne Pander, DDS     [name]  [place] Medical Record #[id number]  Date of Birth: Oct 26, 1960  Herby Abraham, MD  Sanda Linger, MD  Chief Complaint: PostOp Follow Up Visit  01/02/2012  DATE OF DISCHARGE:  OPERATIVE REPORT  PREOPERATIVE DIAGNOSIS: Pre-infarctional angina with three-vessel  coronary artery disease.  POSTOPERATIVE DIAGNOSIS: Pre-infarctional angina with three-vessel  coronary artery disease.  SURGICAL PROCEDURE: Coronary artery bypass grafting x4 with the left  internal mammary to the left anterior descending coronary artery,  reverse saphenous vein graft to the acute marginal coronary artery,  sequential reverse saphenous vein graft to the first and second obtuse  marginals with right leg endovein harvesting.  History of Present Illness:  Returns after CABG.He has a long standing history of CAD with prior PTCA/stent to proximal LAD and 2 stents to mid LAD, prior MI in 1999 and now s/p CABG x 4. His other problems include HTN, HLD, tobacco abuse and COPD.  He is not smoking and stopped cold [place]. Walking some. Appetite is ok. Bowels are working ok. He is not lightheaded or dizzy. Not short of breath. Not coughing. No fever or chills. Says he is already feeling better.  Breathing better. Some "itching" feeling left chest wall.  History   Smoking status   .  Former Smoker -- 2.0 packs/day for 35 years   .  Types:  Cigarettes   .  Quit date:  12/21/2011   Smokeless tobacco   .  Never Used    Allergies   Allergen  Reactions   .  Lisinopril  Cough   .  Tea  Hives    Current Outpatient Prescriptions   Medication  Sig  Dispense  Refill   .  ALPRAZolam (XANAX) 0.25 MG tablet  Take 1 tablet (0.25 mg total) by  mouth at bedtime as needed for sleep.  30 tablet  0   .  amLODipine (NORVASC) 5 MG tablet  Take 1 tablet (5 mg total) by mouth daily.  30 tablet  1   .  aspirin 325 MG EC tablet  Take 1 tablet (325 mg total) by mouth daily.     Marland Kitchen  HYDROcodone-acetaminophen (LORTAB 7.5) 7.5-500 MG per tablet  Take 1 tablet by mouth every 4 (four) hours as needed for pain (may take one or two tablets every 4-6 hrs prn).  40 tablet  0   .  metoprolol (LOPRESSOR) 50 MG tablet  Take 1 tablet (50 mg total) by mouth 2 (two) times daily.  60 tablet  1   .  penicillin v potassium (VEETID) 500 MG tablet  Take 1 tablet (500 mg total) by mouth 3 (three) times daily.  21 tablet  0   .  simvastatin (ZOCOR) 20 MG tablet  Take 1 tablet (20 mg total) by mouth at bedtime.  30 tablet  1   Physical Exam:  BP 128/86  Pulse 72  Resp 18  Ht 5\' 10"  (1.778 m)  Wt 216 lb (97.977 kg)  BMI 30.99 kg/m2  SpO2 98%  General appearance: alert, cooperative and no distress  Neurologic: intact  Heart: regular rate and rhythm, S1, S2 normal, no murmur, click, rub or gallop and normal apical impulse  Lungs: clear to auscultation bilaterally and normal percussion bilaterally  Abdomen: soft, non-tender; bowel sounds normal; no masses, no organomegaly  Extremities: extremities normal, atraumatic, no cyanosis or edema and Homans sign is negative, no sign of DVT  Wound: sternum stable  Diagnostic Studies & Laboratory data:  Recent Radiology Findings:  Dg Chest 2 View  01/31/2012 *RADIOLOGY REPORT* Clinical Data: CABG 01/04/2012. CHEST - 2 VIEW Comparison: 01/06/2012 Findings: Continued improvement in left lower lobe airspace disease. Mild amount of residual atelectasis is present in the left lower lobe. Right lung is clear. No pleural effusion is identified. There is no heart failure. Negative for mass or pneumonia. IMPRESSION: Near complete clearing of left lower lobe consolidation. Original Report Authenticated By: Camelia Phenes, M.D.  Recent  Labs:  Lab Results   Component  Value  Date    WBC  7.2  01/21/2012    HGB  11.3*  01/21/2012    HCT  33.6*  01/21/2012    PLT  356.0  01/21/2012    GLUCOSE  79  01/21/2012    CHOL  201*  01/01/2012    TRIG  644*  01/01/2012    HDL  22*  01/01/2012    LDLDIRECT  124.8  06/28/2010    LDLCALC  UNABLE TO CALCULATE IF TRIGLYCERIDE OVER 400 mg/dL  07/30/8117    ALT  21  12/31/2011    AST  19  12/31/2011    NA  133*  01/21/2012    K  4.4  01/21/2012    CL  98  01/21/2012    CREATININE  1.0  01/21/2012    BUN  11  01/21/2012    CO2  28  01/21/2012    TSH  2.35  04/14/2010    INR  1.12  01/02/2012    HGBA1C  5.3  01/02/2012   Assessment / Plan:  Doing well post op  Wants to get disability  Needs teeth removed  GERHARDT,EDWARD B  01/31/2012 9:52 AM

## 2012-02-18 NOTE — Progress Notes (Signed)
DENTAL CONSULTATION  Date of Consultation:  02/18/2012 Patient Name:   [name] Date of Birth:   [birth year] Medical Record Number: [id number]  VITALS: BP 128/78  Pulse 86  Temp 98.4 F (36.9 C) (Oral)   CHIEF COMPLAINT: Patient referred for evaluation of poor dentition.  HPI: [name] is a 51 year old male referred by Dr. Tyrone Sage for dental consultation. Patient recently had a four-vessel coronary artery bypass graft procedure on 01/02/2012.  Patient was seen for followup by Dr. Tyrone Sage on 01/31/2012. Patient referred to dental medicine for evaluation to rule out dental infection that may affect the patient's systemic health.  Patient with a history of acute pulpitis symptoms.  Patient describes the pain as being dull and achy in nature and last for hours at a time. Pain reaches an intensity of 7/10 but is currently 2/10 today. This has been occurring in an intermittent fashion for "about ten years". Patient has not seen a dentist for "a long time".  Patient last seen in 1990 tablet tooth pulled. Patient denies any competitions for that dental extraction. Patient saw Dr. Katrinka Blazing in [place] at that time.  Patient indicates that he" needs them all out".     PMH: Past Medical History  Diagnosis Date  . Coronary artery disease 2002    Lesion in LAD s/p angioplasty; s/p CABG x 4 Sept 2013  . Hypertension   . Hyperlipidemia   . Myocardial infarct   . Tobacco abuse     stopped September 2013  . COPD (chronic obstructive pulmonary disease)   . Poor dentition     PSH: Past Surgical History  Procedure Date  . Coronary angioplasty with stent placement 11/1997, 11/2000  . Knee surgery     left x 2  . Coronary artery bypass graft 01/02/2012    Procedure: CORONARY ARTERY BYPASS GRAFTING (CABG);  Surgeon: Delight Ovens, MD;  Location: [facility] OR;  Service: Open Heart Surgery;  Laterality: N/A;  times three using left internal mammary artery and right endoscopically  harvested saphenous vein  . Tee without cardioversion 01/02/2012    Procedure: TRANSESOPHAGEAL ECHOCARDIOGRAM (TEE);  Surgeon: Delight Ovens, MD;  Location: [facility] OR;  Service: Open Heart Surgery;  Laterality: N/A;    ALLERGIES: Allergies  Allergen Reactions  . Lisinopril Cough  . Tea Hives    MEDICATIONS: Current Outpatient Prescriptions  Medication Sig Dispense Refill  . amLODipine (NORVASC) 5 MG tablet Take 1 tablet (5 mg total) by mouth daily.  30 tablet  1  . aspirin 325 MG EC tablet Take 1 tablet (325 mg total) by mouth daily.      . metoprolol (LOPRESSOR) 50 MG tablet Take 1 tablet (50 mg total) by mouth 2 (two) times daily.  60 tablet  1  . penicillin v potassium (VEETID) 500 MG tablet Take 1 tablet (500 mg total) by mouth 3 (three) times daily.  21 tablet  0  . simvastatin (ZOCOR) 20 MG tablet Take 1 tablet (20 mg total) by mouth at bedtime.  30 tablet  1  . ALPRAZolam (XANAX) 0.25 MG tablet Take 1 tablet (0.25 mg total) by mouth at bedtime as needed for sleep.  30 tablet  0  . HYDROcodone-acetaminophen (LORTAB 7.5) 7.5-500 MG per tablet Take 1 tablet by mouth every 4 (four) hours as needed for pain (may take one or two tablets every 4-6 hrs prn).  40 tablet  0    LABS: Lab Results  Component Value Date  WBC 7.2 01/21/2012   HGB 11.3* 01/21/2012   HCT 33.6* 01/21/2012   MCV 87.1 01/21/2012   PLT 356.0 01/21/2012      Component Value Date/Time   NA 133* 01/21/2012 1029   K 4.4 01/21/2012 1029   CL 98 01/21/2012 1029   CO2 28 01/21/2012 1029   GLUCOSE 79 01/21/2012 1029   BUN 11 01/21/2012 1029   CREATININE 1.0 01/21/2012 1029   CREATININE 0.70 11/23/2010 0850   CALCIUM 8.8 01/21/2012 1029   GFRNONAA >90 01/07/2012 0710   GFRAA >90 01/07/2012 0710   Lab Results  Component Value Date   INR 1.12 01/02/2012   INR 1.00 12/31/2011   No results found for this basename: PTT    SOCIAL HISTORY: History   Social History  . Marital Status: Married    Spouse Name: N/A    Number  of Children: 5  . Years of Education: N/A   Occupational History  . Works in [employer]    Social History Main Topics  . Smoking status: Former Smoker -- 2.0 packs/day for 35 years    Types: Cigarettes    Start date: 06/30/1976    Quit date: 12/31/2011  . Smokeless tobacco: Never Used  . Alcohol Use: No  . Drug Use: No  . Sexually Active: Yes   Other Topics Concern  . Not on file   Social History Narrative  . No narrative on file    FAMILY HISTORY: Family History  Problem Relation Age of Onset  . Cancer Neg Hx   . Heart disease Father      REVIEW OF SYSTEMS: Reviewed with patient today and included in dental record.  DENTAL HISTORY: CHIEF COMPLAINT: Patient referred for evaluation of poor dentition.  HPI: [name] is a 51 year old male referred by Dr. Tyrone Sage for dental consultation. Patient recently had a four-vessel coronary artery bypass graft procedure on 01/02/2012.  Patient was seen for followup by Dr. Tyrone Sage on 01/31/2012. Patient referred to dental medicine for evaluation to rule out dental infection that may affect the patient's systemic health.  Patient with a history of acute pulpitis symptoms.  Patient describes the pain as being dull and achy in nature and last for hours at a time. Pain reaches an intensity of 7/10 but is currently 2/10 today. This has been occurring in an intermittent fashion for "about ten years". Patient has not seen a dentist for "a long time".  Patient last seen in 1990 tablet tooth pulled. Patient denies any competitions for that dental extraction. Patient saw Dr. Katrinka Blazing in [place] at that time.  Patient indicates that he" needs them all out".    DENTAL EXAMINATION:  GENERAL: Patient is a well-developed, well-nourished male in no acute distress. HEAD AND NECK: There is no obvious lymphadenopathy. The patient denies acute TMJ symptoms. INTRAORAL EXAM: Patient has normal saliva. I do not see any evidence of  abscess formation. Patient has bilateral mandibular lingual tori. DENTITION: Patient has retained root segments numbers 4-7, 10-15, 19-29, and 32. PERIODONTAL: Patient with chronic periodontitis with plaque and calculus accumulations, generalized gingival recession, and tooth mobility. There is incipient to moderate bone loss. DENTAL CARIES/SUBOPTIMAL RESTORATIONS: Patient is affected by multiple retained root segments, all affected by dental caries. ENDODONTIC: Patient with a history of acute pulpitis symptoms. There are multiple areas of periapical pathology and radiolucency. CROWN AND BRIDGE: There are no crown or bridge restorations PROSTHODONTIC: Patient has no history of partial dentures. OCCLUSION: Patient with  a poor occlusal scheme secondary to multiple missing teeth, multiple retained root segments, and lack of replacement of missing teeth with  dental prostheses.  RADIOGRAPHIC INTERPRETATION: An orthopantogram and full series of periapical radiographs was obtained today. There multiple retained root segments. There is incipient to moderate bone loss. There multiple areas of periapical radiolucency and pathology. Dental caries are noted to be affecting the remaining root segments.    ASSESSMENTS: 1. Multiple retained root segments 2. Chronic apical periodontitis 3. Chronic periodontitis with bone loss 4. Plaque and calculus accumulations 5. Dental caries 6. Bilateral mandibular lingual tori 7. Multiple missing teeth 8. Poor occlusal scheme and malocclusion 9. History of oral neglect  PLAN/RECOMMENDATIONS: 1. I discussed the risks, benefits, and complications of various treatment options with the patient in relationship to the patient's medical and dental conditions. We discussed various treatment options to include no treatment, multiple extractions with alveoloplasty, pre-prosthetic surgery as indicated, periodontal therapy, dental restorations, root canal therapy, crown and  bridge therapy, implant therapy, and replacement of missing teeth as indicated. The patient currently wishes to proceed with extraction remaining teeth with alveoloplasty and pre-prosthetic surgery in the operating room with general anesthesia.  Patient will then followup with a dentist of his choice for fabrication of upper and lower complete dentures after adequate healing. Patient is aware of the potential complications up to and including death and does agree to proceed with treatment as discussed above. OR has been scheduled for Thursday, October 31 at 7:30 AM at [facility] long operating room.   2. Discussion of findings with medical team and coordination of future medical and dental care.  Charlynne Pander, DDS

## 2012-02-19 ENCOUNTER — Encounter (HOSPITAL_COMMUNITY): Payer: Self-pay | Admitting: Pharmacy Technician

## 2012-02-19 ENCOUNTER — Encounter (HOSPITAL_COMMUNITY): Payer: Self-pay

## 2012-02-19 ENCOUNTER — Telehealth: Payer: Self-pay | Admitting: Nurse Practitioner

## 2012-02-19 ENCOUNTER — Encounter (HOSPITAL_COMMUNITY)
Admission: RE | Admit: 2012-02-19 | Discharge: 2012-02-19 | Disposition: A | Discharge status: Home / Self Care | Payer: Medicaid Other | Source: Ambulatory Visit | Attending: Dentistry | Admitting: Dentistry

## 2012-02-19 LAB — BASIC METABOLIC PANEL
BUN: 8 mg/dL (ref 6–23)
CO2: 26 mEq/L (ref 19–32)
Chloride: 97 mEq/L (ref 96–112)
Creatinine, Ser: 0.82 mg/dL (ref 0.50–1.35)

## 2012-02-19 LAB — CBC
HCT: 37.2 % — ABNORMAL LOW (ref 39.0–52.0)
MCV: 83.6 fL (ref 78.0–100.0)
RBC: 4.45 MIL/uL (ref 4.22–5.81)
WBC: 5.5 10*3/uL (ref 4.0–10.5)

## 2012-02-19 NOTE — Pre-Procedure Instructions (Signed)
21 [name]  02/19/2012   Your procedure is scheduled on:  Thursday 02-21-2012  Report to [facility] at  AM. 0530  Call this number if you have problems the morning of surgery: [phone]   Remember:   Do not drink liquids or  eat food:After Midnight  Wednesday night      Take these medicines the morning of surgery with A SIP OF WATER: Metoprolol   Do not wear jewelry, make-up or nail polish.  Do not wear lotions, powders, or perfumes. You may wear deodorant.  Do not shave 48 hours prior to surgery. Men may shave face and neck.  Do not bring valuables to the hospital.  Contacts, dentures or bridgework may not be worn into surgery.  Leave suitcase in the car. After surgery it may be brought to your room.  For patients admitted to the hospital, checkout time is 11:00 AM the day of discharge.   Patients discharged the day of surgery will not be allowed to drive home.  Name and phone number of your driver: Savien Mamula [phone]  See [facility] Preparing for surgery sheet.   Please read over the following fact sheets that you were given: MRSA Information

## 2012-02-19 NOTE — Progress Notes (Addendum)
EKG Dr. Tyrone Sage 01-21-2012 and CXR 01-31-2012 in Epic See note 02-19-2012 Dr. Kristin Bruins to Dr. Tyrone Sage cardiologist re: surgery

## 2012-02-19 NOTE — Telephone Encounter (Signed)
Phone call from Dr. Kristin Bruins - hospital dentist. He is planning on extracting [name]'s teeth later this week. He is not on Plavix. Has had CABG with normal EF. Was doing fine at his last OV. Should be ok to proceed.

## 2012-02-21 ENCOUNTER — Encounter (HOSPITAL_COMMUNITY): Payer: Self-pay | Admitting: Anesthesiology

## 2012-02-21 ENCOUNTER — Ambulatory Visit (HOSPITAL_COMMUNITY): Payer: Medicaid Other | Admitting: Anesthesiology

## 2012-02-21 ENCOUNTER — Encounter (HOSPITAL_COMMUNITY)
Admission: RE | Disposition: A | Discharge status: Home / Self Care | Payer: Self-pay | Source: Ambulatory Visit | Attending: Dentistry

## 2012-02-21 ENCOUNTER — Ambulatory Visit (HOSPITAL_COMMUNITY)
Admission: RE | Admit: 2012-02-21 | Discharge: 2012-02-21 | Disposition: A | Discharge status: Home / Self Care | Payer: Medicaid Other | Source: Ambulatory Visit | Attending: Dentistry | Admitting: Dentistry

## 2012-02-21 ENCOUNTER — Encounter (HOSPITAL_COMMUNITY): Payer: Self-pay

## 2012-02-21 DIAGNOSIS — J449 Chronic obstructive pulmonary disease, unspecified: Secondary | ICD-10-CM | POA: Insufficient documentation

## 2012-02-21 DIAGNOSIS — K0530 Chronic periodontitis, unspecified: Secondary | ICD-10-CM | POA: Diagnosis present

## 2012-02-21 DIAGNOSIS — I10 Essential (primary) hypertension: Secondary | ICD-10-CM | POA: Insufficient documentation

## 2012-02-21 DIAGNOSIS — F17200 Nicotine dependence, unspecified, uncomplicated: Secondary | ICD-10-CM | POA: Insufficient documentation

## 2012-02-21 DIAGNOSIS — M270 Developmental disorders of jaws: Secondary | ICD-10-CM | POA: Diagnosis present

## 2012-02-21 DIAGNOSIS — M278 Other specified diseases of jaws: Secondary | ICD-10-CM | POA: Insufficient documentation

## 2012-02-21 DIAGNOSIS — I2510 Atherosclerotic heart disease of native coronary artery without angina pectoris: Secondary | ICD-10-CM | POA: Insufficient documentation

## 2012-02-21 DIAGNOSIS — K083 Retained dental root: Secondary | ICD-10-CM

## 2012-02-21 DIAGNOSIS — K045 Chronic apical periodontitis: Secondary | ICD-10-CM | POA: Insufficient documentation

## 2012-02-21 DIAGNOSIS — Z951 Presence of aortocoronary bypass graft: Secondary | ICD-10-CM | POA: Insufficient documentation

## 2012-02-21 DIAGNOSIS — J4489 Other specified chronic obstructive pulmonary disease: Secondary | ICD-10-CM | POA: Insufficient documentation

## 2012-02-21 DIAGNOSIS — I252 Old myocardial infarction: Secondary | ICD-10-CM | POA: Insufficient documentation

## 2012-02-21 HISTORY — PX: MULTIPLE EXTRACTIONS WITH ALVEOLOPLASTY: SHX5342

## 2012-02-21 SURGERY — MULTIPLE EXTRACTION WITH ALVEOLOPLASTY
Anesthesia: General | Site: Mouth | Wound class: Dirty or Infected

## 2012-02-21 MED ORDER — HYDRALAZINE HCL 20 MG/ML IJ SOLN
INTRAMUSCULAR | Status: AC
  Filled 2012-02-21: qty 1

## 2012-02-21 MED ORDER — HYDRALAZINE HCL 20 MG/ML IJ SOLN
5.0000 mg | INTRAMUSCULAR | Status: DC | PRN
  Administered 2012-02-21 (×3): 5 mg via INTRAVENOUS

## 2012-02-21 MED ORDER — LIDOCAINE-EPINEPHRINE 2 %-1:100000 IJ SOLN
INTRAMUSCULAR | Status: AC
  Filled 2012-02-21: qty 6.8

## 2012-02-21 MED ORDER — DEXAMETHASONE SODIUM PHOSPHATE 10 MG/ML IJ SOLN
INTRAMUSCULAR | Status: DC | PRN
  Administered 2012-02-21: 10 mg via INTRAVENOUS

## 2012-02-21 MED ORDER — HYDROMORPHONE HCL PF 1 MG/ML IJ SOLN
INTRAMUSCULAR | Status: AC
  Filled 2012-02-21: qty 1

## 2012-02-21 MED ORDER — LABETALOL HCL 5 MG/ML IV SOLN
5.0000 mg | INTRAVENOUS | Status: DC | PRN
  Administered 2012-02-21 (×2): 5 mg via INTRAVENOUS

## 2012-02-21 MED ORDER — HYDROMORPHONE HCL PF 1 MG/ML IJ SOLN
0.2500 mg | INTRAMUSCULAR | Status: DC | PRN
  Administered 2012-02-21 (×2): 0.25 mg via INTRAVENOUS

## 2012-02-21 MED ORDER — BUPIVACAINE-EPINEPHRINE PF 0.5-1:200000 % IJ SOLN
INTRAMUSCULAR | Status: AC
  Filled 2012-02-21: qty 7.2

## 2012-02-21 MED ORDER — LACTATED RINGERS IV SOLN
INTRAVENOUS | Status: DC | PRN
  Administered 2012-02-21 (×2): via INTRAVENOUS

## 2012-02-21 MED ORDER — PROPOFOL 10 MG/ML IV BOLUS
INTRAVENOUS | Status: DC | PRN
  Administered 2012-02-21: 200 mg via INTRAVENOUS

## 2012-02-21 MED ORDER — ISOPROPYL ALCOHOL 70 % SOLN
Status: DC | PRN
  Administered 2012-02-21: 1 via TOPICAL

## 2012-02-21 MED ORDER — LIDOCAINE-EPINEPHRINE 2 %-1:100000 IJ SOLN
INTRAMUSCULAR | Status: DC | PRN
  Administered 2012-02-21: 7.2 mL

## 2012-02-21 MED ORDER — OXYMETAZOLINE HCL 0.05 % NA SOLN
NASAL | Status: DC | PRN
  Administered 2012-02-21 (×3): 2 via NASAL

## 2012-02-21 MED ORDER — LABETALOL HCL 5 MG/ML IV SOLN
INTRAVENOUS | Status: AC
  Filled 2012-02-21: qty 4

## 2012-02-21 MED ORDER — SUCCINYLCHOLINE CHLORIDE 20 MG/ML IJ SOLN
INTRAMUSCULAR | Status: DC | PRN
  Administered 2012-02-21: 100 mg via INTRAVENOUS

## 2012-02-21 MED ORDER — ACETAMINOPHEN 10 MG/ML IV SOLN
INTRAVENOUS | Status: DC | PRN
  Administered 2012-02-21: 1000 mg via INTRAVENOUS

## 2012-02-21 MED ORDER — FENTANYL CITRATE 0.05 MG/ML IJ SOLN
INTRAMUSCULAR | Status: DC | PRN
  Administered 2012-02-21: 50 ug via INTRAVENOUS
  Administered 2012-02-21 (×2): 100 ug via INTRAVENOUS

## 2012-02-21 MED ORDER — CEFAZOLIN SODIUM-DEXTROSE 2-3 GM-% IV SOLR
INTRAVENOUS | Status: DC | PRN
  Administered 2012-02-21: 2 g via INTRAVENOUS

## 2012-02-21 MED ORDER — CEFAZOLIN SODIUM-DEXTROSE 2-3 GM-% IV SOLR
INTRAVENOUS | Status: AC
  Filled 2012-02-21: qty 50

## 2012-02-21 MED ORDER — CEFAZOLIN SODIUM-DEXTROSE 2-3 GM-% IV SOLR: 2.0000 g | Freq: Once | INTRAVENOUS | Status: DC

## 2012-02-21 MED ORDER — LACTATED RINGERS IV SOLN: INTRAVENOUS | Status: DC

## 2012-02-21 MED ORDER — OXYCODONE-ACETAMINOPHEN 5-325 MG PO TABS
1.0000 | ORAL_TABLET | ORAL | Status: DC | PRN
  Administered 2012-02-21: 1 via ORAL
  Filled 2012-02-21: qty 1

## 2012-02-21 MED ORDER — ACETAMINOPHEN 10 MG/ML IV SOLN
INTRAVENOUS | Status: AC
  Filled 2012-02-21: qty 100

## 2012-02-21 MED ORDER — PROMETHAZINE HCL 25 MG/ML IJ SOLN: 6.2500 mg | INTRAMUSCULAR | Status: DC | PRN

## 2012-02-21 MED ORDER — OXYMETAZOLINE HCL 0.05 % NA SOLN
NASAL | Status: AC
  Filled 2012-02-21: qty 15

## 2012-02-21 MED ORDER — LIDOCAINE HCL (CARDIAC) 20 MG/ML IV SOLN
INTRAVENOUS | Status: DC | PRN
  Administered 2012-02-21: 60 mg via INTRAVENOUS

## 2012-02-21 MED ORDER — MIDAZOLAM HCL 5 MG/5ML IJ SOLN
INTRAMUSCULAR | Status: DC | PRN
  Administered 2012-02-21: 2 mg via INTRAVENOUS

## 2012-02-21 MED ORDER — BUPIVACAINE-EPINEPHRINE PF 0.5-1:200000 % IJ SOLN
INTRAMUSCULAR | Status: DC | PRN
  Administered 2012-02-21: 7.2 mL

## 2012-02-21 MED ORDER — OXYCODONE-ACETAMINOPHEN 5-325 MG PO TABS: ORAL_TABLET | ORAL | Status: DC

## 2012-02-21 SURGICAL SUPPLY — 27 items
ATTRACTOMAT 16X20 MAGNETIC DRP (DRAPES) ×2 IMPLANT
BAG ZIPLOCK 12X15 (MISCELLANEOUS) ×2 IMPLANT
BLADE SURG 15 STRL LF DISP TIS (BLADE) ×2 IMPLANT
BLADE SURG 15 STRL SS (BLADE) ×2
CANNULA VESSEL W/WING WO/VALVE (CANNULA) ×4 IMPLANT
CLOTH BEACON ORANGE TIMEOUT ST (SAFETY) ×2 IMPLANT
COVER SURGICAL LIGHT HANDLE (MISCELLANEOUS) ×2 IMPLANT
GAUZE SPONGE 4X4 16PLY XRAY LF (GAUZE/BANDAGES/DRESSINGS) ×2 IMPLANT
GLOVE SURG ORTHO 8.0 STRL STRW (GLOVE) ×2 IMPLANT
GLOVE SURG SS PI 6.5 STRL IVOR (GLOVE) ×2 IMPLANT
GOWN STRL NON-REIN LRG LVL3 (GOWN DISPOSABLE) ×2 IMPLANT
GOWN STRL REIN 3XL LVL4 (GOWN DISPOSABLE) ×2 IMPLANT
KIT BASIN OR (CUSTOM PROCEDURE TRAY) ×2 IMPLANT
NS IRRIG 1000ML POUR BTL (IV SOLUTION) ×2 IMPLANT
PACK EENT SPLIT (PACKS) ×2 IMPLANT
PACKING VAGINAL (PACKING) ×2 IMPLANT
PAD EYE OVAL STERILE LF (GAUZE/BANDAGES/DRESSINGS) ×4 IMPLANT
SPONGE GAUZE 4X4 12PLY (GAUZE/BANDAGES/DRESSINGS) ×2 IMPLANT
SUCTION FRAZIER 12FR DISP (SUCTIONS) ×2 IMPLANT
SUT CHROMIC 3 0 PS 2 (SUTURE) ×8 IMPLANT
SUT CHROMIC 4 0 P 3 18 (SUTURE) IMPLANT
SYR 50ML LL SCALE MARK (SYRINGE) ×2 IMPLANT
TOWEL OR 17X26 10 PK STRL BLUE (TOWEL DISPOSABLE) ×2 IMPLANT
TUBING CONNECTING 10 (TUBING) ×2 IMPLANT
VESSEL CANN W0 1 W VA 30003 (MISCELLANEOUS) ×2 IMPLANT
WATER STERILE IRR 1500ML POUR (IV SOLUTION) ×2 IMPLANT
YANKAUER SUCT BULB TIP NO VENT (SUCTIONS) ×2 IMPLANT

## 2012-02-21 NOTE — Op Note (Signed)
Patient:            [name] Date of Birth:  1960/09/04 MRN:                [id number]   DATE OF PROCEDURE:  02/21/2012               OPERATIVE REPORT   PREOPERATIVE DIAGNOSES: 1. History of coronary artery disease-status post coronary artery bypass graft procedure 2. Multiple retained root segments 3. Chronic apical periodontitis 4. Chronic periodontitis 5. Bilateral mandibular lingual tori  POSTOPERATIVE DIAGNOSES: 1. History of coronary artery disease-status post coronary artery bypass graft procedure 2. Multiple retained root segments 3. Chronic apical periodontitis 4. Chronic periodontitis 5. Bilateral mandibular lingual tori   OPERATIONS: 1. Multiple extraction of tooth numbers 4, 5, 6, 7, 10, 11, 12, 13, 14, 15, 19, 20, 21, 22, 23, 24, 25, 26, 27, 28, 29, and 32. 2. 4 Quadrants of alveoloplasty 3. bilateral mandibular lingual tori reductions   SURGEON: Charlynne Pander, DDS  ASSISTANT: Zettie Pho, (dental assistant)  ANESTHESIA: General anesthesia via nasoendotracheal tube.  MEDICATIONS: 1. Ancef 2 g IV prior to invasive dental procedures. 2. Local anesthesia with a total utilization of 5 carpules each containing 34 mg of lidocaine with 0.017 mg of epinephrine as well as 2 carpules each containing 9 mg of bupivacaine with 0.009 mg of epinephrine.  SPECIMENS: There are 22 teeth that were discarded.  DRAINS: None  CULTURES: None  COMPLICATIONS: None   ESTIMATED BLOOD LOSS: 100 mLs.  INTRAVENOUS FLUIDS: 900 mLs of Lactated ringers solution.  INDICATIONS: The patient was recently diagnosed with coronary artery disease and underwent a coronary artery bypass graft procedure with Dr. Tyrone Sage.  A dental consultation was then requested to rule out dental infection that may affect the patient's systemic health and previous coronary artery bypass graft procedure.  The patient was examined and treatment planned for extraction of remaining teeth with  alveoloplasty and pre-prosthetic surgery as indicated.  OPERATIVE FINDINGS: Patient was examined operating room number 12.  The teeth were identified for extraction. The patient was noted be affected by chronic periodontitis, multiple retained root segments, chronic apical periodontitis, and bilateral mandibular lingual tori.   DESCRIPTION OF PROCEDURE: Patient was brought to the main operating room number 12. Patient was then placed in the supine position on the operating table. General Anesthesia was then induced per the anesthesia team. The patient was then prepped and draped in the usual manner for dental medicine procedure. A timeout was performed. The patient was identified and procedures were verified. A throat pack was placed at this time. The oral cavity was then thoroughly examined with the findings noted above. The patient was then ready for dental medicine procedure as follows:  Local anesthesia was then administered sequentially with a total utilization of 5 carpules each containing 34 mg of lidocaine with 0.017 mg of epinephrine as well as 2 carpules  each containing 9 mg bupivacaine with 0.009 mg of epinephrine.  The Maxillary left and right quadrants first approached. Anesthesia was then delivered utilizing infiltration with lidocaine with epinephrine. A #15 blade incision was then made from the  Distal of #3 and extended to the maxillary left tuberosity.  A  surgical flap was then carefully reflected. Appropriate amounts of buccal and interseptal bone were then removed utilizing a surgical handpiece and bur and copious amounts of sterile water .  The teeth were then subluxated with a series of straight elevators. Tooth numbers 4, 5,  6, 7,10, 11, 12, 13, 14, and 15 were then removed with a 150  forceps without complications. Purulent exudate was removed from the area of numbers 10 during the extraction. Extensive granulation tissue was removed with a rongeur.  Alveoloplasty was then  performed utilizing a ronguers and bone file. The surgical site was then irrigated with copious amounts of sterile saline. This area #10 was further irrigated with copious amounts sterile saline. The tissues were approximated and trimmed appropriately. The surgical site was then closed from the distal of #3 and extended the mesial #8 utilizing 3-0 chromic gut suture in a continuous interrupted suture technique x1. The maxillary left surgical site was then closed the maxillary left tuberosity and extended the mesial 9 utilizing 3-0 chromic gut suture in a continuous interrupted suture technique x1. .  At this point time, the mandibular quadrants were approached. The patient was given bilateral inferior alveolar nerve blocks and long buccal nerve blocks utilizing the bupivacaine with epinephrine. Further infiltration was then achieved utilizing the lidocaine with epinephrine. A 15 blade incision was then made from the distal of number 17 and extended to the distal of #32 .  A surgical flap was then carefully reflected. Appropriate amounts of buccal and interseptal bone were then removed appropriately using a surgical handpiece and bur and copious amounts of sterile water. Tooth numbers 19, 20, 21, 22, 23, 24, 25, 26, 27, 28, 29, and 32 were then removed with a 151 forceps.  A small retained root tip in the area of #26 was then removed with a root tip pick without complications. Alveoloplasty was then performed utilizing a rongeurs and bone file. At this point time the mandibular left and mandibular right and mandibular tori were visualized and removed with a surgical handpiece and bur and copious of sterile saline. Further lateral exostoses on the mandibular left lingual were aspect removed with a surgical handpiece and bur and copious of sterile water. Further alveoloplasty was then performed utilizing a rongeur  and bone file. The tissues were approximated and trimmed appropriately. The surgical sites were then  irrigated with copious amounts of sterile saline. The mandibular left surgical site was then closed from the distal of 17 and extended to the mesial #24 utilizing 3-0 chromic gut suture in a continuous surface suture technique x1. The mandibular right surgical site was then closed from the distal of #32 and extended the mesial #24 utilizing 3-0 chromic gut suture in a continuous interrupted suture technique x1. At this point time 2 individual interrupted sutures are then placed to further close the surgical site.  At this point time, the entire mouth was irrigated with copious amounts of sterile saline. The patient was exam for complications, seeing none, the dental medicine procedure was deemed to be complete. The throat pack was removed at this time. A series of 4 x 4 gauze were placed in the mouth to aid hemostasis. The patient was then handed over to the anesthesia team for final disposition. After an appropriate amount of time, the patient was extubated and taken to the postanesthsia care unit with stable vital signs and a good condition. All counts were correct for the dental medicine procedure. Patient is to continue his penicillin antibiotic therapy until all gone. Patient be given Percocet 5/325 pain medication to use for pain as indicated. Patient to return to clinic in approximately 7-10 days for evaluation of suture removal.   Charlynne Pander, DDS.

## 2012-02-21 NOTE — Progress Notes (Signed)
PRE-OPERATIVE NOTE:  02/21/2012 [name] [id number]  VITALS: BP 125/89  Pulse 74  Temp 98.2 F (36.8 C) (Oral)  Resp 18  SpO2 100%  Lab Results  Component Value Date   WBC 5.5 02/19/2012   HGB 12.6* 02/19/2012   HCT 37.2* 02/19/2012   MCV 83.6 02/19/2012   PLT 207 02/19/2012   BMET    Component Value Date/Time   NA 133* 02/19/2012 1455   K 4.0 02/19/2012 1455   CL 97 02/19/2012 1455   CO2 26 02/19/2012 1455   GLUCOSE 91 02/19/2012 1455   BUN 8 02/19/2012 1455   CREATININE 0.82 02/19/2012 1455   CREATININE 0.70 11/23/2010 0850   CALCIUM 9.1 02/19/2012 1455   GFRNONAA >90 02/19/2012 1455   GFRAA >90 02/19/2012 1455    Lab Results  Component Value Date   INR 1.12 01/02/2012   INR 1.00 12/31/2011   No results found for this basename: PTT     [name]  presents for multiple extractions with alveoloplasty and pre-prosthetic surgery as indicated in the operating room with general anesthesia.   SUBJECTIVE: The patient denies any acute medical or dental changes and agrees to proceed with treatment as planned.  EXAM: No sign of acute dental changes.  ASSESSMENT: Patient is affected by multiple retained root segments, chronic apical periodontitis, dental caries, and exostoses.  PLAN: Patient agrees to proceed with treatment as planned in the operating room as previously discussed and accepts the risks, benefits, complications of the proposed treatment.  Charlynne Pander, DDS

## 2012-02-21 NOTE — Anesthesia Procedure Notes (Signed)
Procedure Name: Intubation Date/Time: 02/21/2012 7:50 AM Performed by: [place], Apple Dearmas C Pre-anesthesia Checklist: Patient identified, Timeout performed, Emergency Drugs available, Suction available and Patient being monitored Patient Re-evaluated:Patient Re-evaluated prior to inductionOxygen Delivery Method: Circle system utilized Preoxygenation: Pre-oxygenation with 100% oxygen Intubation Type: IV induction Ventilation: Mask ventilation without difficulty Laryngoscope Size: Mac and 4 Grade View: Grade I Nasal Tubes: Nasal prep performed, Right, Nasal Rae and Magill forceps- large, utilized Tube size: 7.0 mm Number of attempts: 1 Placement Confirmation: ETT inserted through vocal cords under direct vision,  breath sounds checked- equal and bilateral,  positive ETCO2 and CO2 detector Tube secured with: Tape Dental Injury: Teeth and Oropharynx as per pre-operative assessment

## 2012-02-21 NOTE — Anesthesia Postprocedure Evaluation (Signed)
Anesthesia Post Note  Patient: [name]  Procedure(s) Performed: Procedure(s) (LRB): MULTIPLE EXTRACION WITH ALVEOLOPLASTY (N/A)  Anesthesia type: General  Patient location: PACU  Post pain: Pain level controlled  Post assessment: Post-op Vital signs reviewed  Last Vitals:  Filed Vitals:   02/21/12 1054  BP: 140/82  Pulse: 71  Temp: 36 C  Resp: 16    Post vital signs: Reviewed  Level of consciousness: sedated  Complications: No apparent anesthesia complications

## 2012-02-21 NOTE — Anesthesia Preprocedure Evaluation (Signed)
Anesthesia Evaluation  Patient identified by MRN, date of birth, ID band Patient awake    Reviewed: Allergy & Precautions, H&P , NPO status , Patient's Chart, lab work & pertinent test results, reviewed documented beta blocker date and time , Unable to perform ROS - Chart review only  History of Anesthesia Complications (+) Emergence Delirium  Airway Mallampati: III TM Distance: >3 FB Neck ROM: Full    Dental  (+) Dental Advisory Given and Poor Dentition   Pulmonary shortness of breath and with exertion, COPD COPD inhaler, Current Smoker, former smoker (1 1/2 ppd; discontinued 12/2011),    Pulmonary exam normal       Cardiovascular hypertension, Pt. on home beta blockers and Pt. on medications + angina + CAD, + Past MI and + CABG (CABG X4 12/2011) Rhythm:Regular Rate:Normal     Neuro/Psych negative neurological ROS  negative psych ROS   GI/Hepatic negative GI ROS, Neg liver ROS,   Endo/Other  negative endocrine ROS  Renal/GU negative Renal ROS  negative genitourinary   Musculoskeletal negative musculoskeletal ROS (+)   Abdominal   Peds  Hematology negative hematology ROS (+)   Anesthesia Other Findings   Reproductive/Obstetrics negative OB ROS                           Anesthesia Physical Anesthesia Plan  ASA: III  Anesthesia Plan: General   Post-op Pain Management:    Induction: Intravenous  Airway Management Planned: Nasal ETT  Additional Equipment:   Intra-op Plan:   Post-operative Plan: Extubation in OR  Informed Consent: I have reviewed the patients History and Physical, chart, labs and discussed the procedure including the risks, benefits and alternatives for the proposed anesthesia with the patient or authorized representative who has indicated his/her understanding and acceptance.   Dental advisory given  Plan Discussed with: CRNA  Anesthesia Plan Comments:          Anesthesia Quick Evaluation

## 2012-02-21 NOTE — H&P (Signed)
Dr. Kristin Bruins H&P reviewed and unchanged from prior evaluation.

## 2012-02-21 NOTE — Transfer of Care (Signed)
Immediate Anesthesia Transfer of Care Note  Patient: [name]  Procedure(s) Performed: Procedure(s) (LRB) with comments: MULTIPLE EXTRACION WITH ALVEOLOPLASTY (N/A) - Extaction of tooth #'s 4,5,6,7,10,11,12,13,14,15,19,20,21,22,23,24,25, 26,27,28,29,32 with alveoloplasty and bilateral mandibular tori reductions   Patient Location: PACU  Anesthesia Type:General  Level of Consciousness: awake and alert   Airway & Oxygen Therapy: Patient Spontanous Breathing and Patient connected to nasal cannula oxygen  Post-op Assessment: Report given to PACU RN and Post -op Vital signs reviewed and stable  Post vital signs: Reviewed and stable  Complications: No apparent anesthesia complications

## 2012-02-21 NOTE — Progress Notes (Signed)
Dr Rica Mast notified. BP  170-180/100  Since admission.  HR 70 -80.  Orders received.

## 2012-02-22 ENCOUNTER — Encounter (HOSPITAL_COMMUNITY): Payer: Self-pay | Admitting: Dentistry

## 2012-03-03 ENCOUNTER — Ambulatory Visit: Payer: PRIVATE HEALTH INSURANCE | Admitting: Cardiology

## 2012-03-05 ENCOUNTER — Encounter: Payer: Self-pay | Admitting: Cardiology

## 2012-03-05 ENCOUNTER — Ambulatory Visit (INDEPENDENT_AMBULATORY_CARE_PROVIDER_SITE_OTHER): Payer: Medicaid Other | Admitting: Cardiology

## 2012-03-05 ENCOUNTER — Encounter (HOSPITAL_COMMUNITY): Payer: Self-pay | Admitting: Dentistry

## 2012-03-05 ENCOUNTER — Ambulatory Visit (HOSPITAL_COMMUNITY): Payer: Self-pay | Admitting: Dentistry

## 2012-03-05 VITALS — BP 152/83 | HR 68 | Temp 98.1°F

## 2012-03-05 VITALS — BP 122/74 | HR 84 | Ht 70.0 in | Wt 221.0 lb

## 2012-03-05 DIAGNOSIS — J449 Chronic obstructive pulmonary disease, unspecified: Secondary | ICD-10-CM

## 2012-03-05 DIAGNOSIS — J4489 Other specified chronic obstructive pulmonary disease: Secondary | ICD-10-CM

## 2012-03-05 DIAGNOSIS — K08109 Complete loss of teeth, unspecified cause, unspecified class: Secondary | ICD-10-CM

## 2012-03-05 DIAGNOSIS — K08199 Complete loss of teeth due to other specified cause, unspecified class: Secondary | ICD-10-CM

## 2012-03-05 DIAGNOSIS — K0820 Unspecified atrophy of edentulous alveolar ridge: Secondary | ICD-10-CM

## 2012-03-05 DIAGNOSIS — E782 Mixed hyperlipidemia: Secondary | ICD-10-CM

## 2012-03-05 DIAGNOSIS — E7800 Pure hypercholesterolemia, unspecified: Secondary | ICD-10-CM

## 2012-03-05 DIAGNOSIS — I2510 Atherosclerotic heart disease of native coronary artery without angina pectoris: Secondary | ICD-10-CM

## 2012-03-05 MED ORDER — PRAVASTATIN SODIUM 40 MG PO TABS: 40.0000 mg | ORAL_TABLET | Freq: Every evening | ORAL | Status: DC

## 2012-03-05 MED ORDER — AMLODIPINE BESYLATE 5 MG PO TABS: 5.0000 mg | ORAL_TABLET | Freq: Every day | ORAL | Status: DC

## 2012-03-05 MED ORDER — METOPROLOL TARTRATE 25 MG PO TABS: ORAL_TABLET | ORAL | Status: DC

## 2012-03-05 MED ORDER — CARVEDILOL 6.25 MG PO TABS: 6.2500 mg | ORAL_TABLET | Freq: Two times a day (BID) | ORAL | Status: DC

## 2012-03-05 NOTE — Progress Notes (Signed)
POST OPERATIVE NOTE:  03/05/2012 [name] [id number]  VITALS: BP 152/83  Pulse 68  Temp 98.1 F (36.7 C) (Oral)  [name] is status post extraction of remaining teeth with alveoloplasty and pre-prosthetic surgery as indicated in the operating room on 02/21/2012.  SUBJECTIVE: Patient without complaints. Patient initially had significant pain and swelling but is minimal now. Patient indicates that stitches are still present.  EXAM: No sign of infection, heme, or ooze. Sutures are loosely intact. Generalized primary closure is noted. Atrophy of edentulous alveolar ridges is noted with decrease prognosis for successful ability to wear upper lower complete dentures.  Procedure: 30 second chlorhexidine rinse. Removed sutures without complications.  ASSESSMENT: Post operative course is consistent with dental procedures performed in the operating room on 02/21/2012.   PLAN: 1. Continue salt water rinses to aid healing . 2. Follow up the Dr. Kaleen Odea for fabrication of upper and lower complete dentures after adequate healing.  Model of pre-extraction upper teeth was provided to patient to give to Dr. Kaleen Odea. 3. Call as needed if acute problems arise.  Charlynne Pander, DDS

## 2012-03-05 NOTE — Patient Instructions (Addendum)
Your physician has recommended you make the following change in your medication: STOP Simvastatin, START Pravastatin 40mg  take one by mouth at bedtime. DECREASE Metoprolol to 25mg  three times a day for four days, then decrease Metoprolol to 25mg  twice a day for 4 days, then stop. START Carvedilol 6.25mg  take one by mouth twice a day when you stop Metoprolol--Do not drive when you start Carvedilol  Your physician has requested that you have an exercise tolerance test in 2 WEEKS with Dr Riley Kill. For further information please visit [email or web address] Please also follow instruction sheet, as given.  Your physician has requested that you regularly monitor and record your blood pressure readings at home. Please use the same machine at the same time of day to check your readings and record them to bring to your follow-up visit.

## 2012-03-05 NOTE — Progress Notes (Signed)
HPI:  Since I saw him most recently he has had his teeth removed by Dr. Kristin Bruins.  He had abscessed teeth, so fortunately this is a blessing.  We had a lengthy discussion today regarding his work situation, and I detailed for him some of the issues that relate to social security.    Current Outpatient Prescriptions  Medication Sig Dispense Refill  . amLODipine (NORVASC) 5 MG tablet Take 5 mg by mouth at bedtime.      Marland Kitchen aspirin 325 MG tablet Take 325 mg by mouth at bedtime.      . metoprolol (LOPRESSOR) 50 MG tablet Take 50 mg by mouth 2 (two) times daily.      . simvastatin (ZOCOR) 20 MG tablet Take 20 mg by mouth every evening.        Allergies  Allergen Reactions  . Lisinopril Cough  . Tea Hives    Past Medical History  Diagnosis Date  . Coronary artery disease 2002    Lesion in LAD s/p angioplasty; s/p CABG x 4 Sept 2013  . Hypertension   . Hyperlipidemia   . Myocardial infarct   . Tobacco abuse     stopped September 2013  . COPD (chronic obstructive pulmonary disease)   . Poor dentition     Past Surgical History  Procedure Date  . Knee surgery     left x 2  . Tee without cardioversion 01/02/2012    Procedure: TRANSESOPHAGEAL ECHOCARDIOGRAM (TEE);  Surgeon: Delight Ovens, MD;  Location: [facility] OR;  Service: Open Heart Surgery;  Laterality: N/A;  . Coronary angioplasty with stent placement 11/1997, 11/2000  . Coronary artery bypass graft 01/02/2012    Procedure: CORONARY ARTERY BYPASS GRAFTING (CABG);  Surgeon: Delight Ovens, MD;  Location: [facility] OR;  Service: Open Heart Surgery;  Laterality: N/A;  times three using left internal mammary artery and right endoscopically harvested saphenous vein  . Multiple extractions with alveoloplasty 02/21/2012    Procedure: MULTIPLE EXTRACION WITH ALVEOLOPLASTY;  Surgeon: Charlynne Pander, DDS;  Location: [facility] ORS;  Service: Oral Surgery;  Laterality: N/A;  Extaction of tooth #'s  4,5,6,7,10,11,12,13,14,15,19,20,21,22,23,24,25, 26,27,28,29,32 with alveoloplasty and bilateral mandibular tori reductions     Family History  Problem Relation Age of Onset  . Cancer Neg Hx   . Heart disease Father     History   Social History  . Marital Status: Married    Spouse Name: N/A    Number of Children: 5  . Years of Education: N/A   Occupational History  . Works in [employer]    Social History Main Topics  . Smoking status: Former Smoker -- 2.0 packs/day for 35 years    Types: Cigarettes    Start date: 06/30/1976    Quit date: 12/31/2011  . Smokeless tobacco: Never Used  . Alcohol Use: No  . Drug Use: No  . Sexually Active: Yes   Other Topics Concern  . Not on file   Social History Narrative  . No narrative on file    ROS: Please see the HPI.  All other systems reviewed and negative.  PHYSICAL EXAM:  BP 122/74  Pulse 84  Ht 5\' 10"  (1.778 m)  Wt 221 lb (100.245 kg)  BMI 31.71 kg/m2  General: Well developed, well nourished, in no acute distress. Head:  Normocephalic and atraumatic. Neck: no JVD Lungs: Clear to auscultation and percussion. Heart: Normal S1 and S2.  No murmur, rubs or gallops.  Abdomen:  Normal bowel sounds; soft; non  tender; no organomegaly Pulses: Pulses normal in all 4 extremities. Extremities: No clubbing or cyanosis. No edema. Neurologic: Alert and oriented x 3.  EKG:  ASSESSMENT AND PLAN:

## 2012-03-05 NOTE — Patient Instructions (Signed)
Patient is to continue salt water rinses as needed to aid healing. Followup with dentist of choice for fabrication of upper and lower complete dentures after adequate healing.

## 2012-03-13 ENCOUNTER — Telehealth: Payer: Self-pay | Admitting: Cardiology

## 2012-03-13 NOTE — Telephone Encounter (Signed)
I spoke with the pt's wife and the pt received a phone call from [employer] today wanting the pt to come back to work tomorrow or he may lose his job.  The pt cannot return to work with any restrictions.  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  She said the pt would not be doing lifting when he returns and that someone else would be doing that part of job if needed.  Currently the family has no income and is relying on family to help pay the bills. I spoke with [name] and made him aware of the pt's situation.  Per [name] the pt can get a note to return to work on 03/14/12 with no restrictions.  Letter was faxed to [phone].  [name] would like the pt to keep his appointment for GXT on 03/18/12 and the pt's wife agreed that he would be here for test.

## 2012-03-13 NOTE — Telephone Encounter (Signed)
New Problem:    Patient's wife called in because her husband would need to return to work asap with no restrictions.  Please call back.

## 2012-03-18 ENCOUNTER — Telehealth: Payer: Self-pay | Admitting: Cardiology

## 2012-03-18 ENCOUNTER — Encounter: Payer: Self-pay | Admitting: Cardiology

## 2012-03-18 NOTE — Telephone Encounter (Signed)
I spoke with the pt's wife and the pt has a stomach virus.  The pt developed diarrhea and stomach cramps yesterday and has spent all morning in the bathroom with diarrhea.  The pt did take an Imodium this morning.  At this time we will reschedule the pt's GXT to next week.  I made the pt's wife aware that the pt needs to increase his fluid intake at this time. GXT scheduled on 03/26/12.

## 2012-03-18 NOTE — Telephone Encounter (Signed)
plz return call to pt wife Shana at [facility]# regarding questions about GXT .

## 2012-03-23 NOTE — Assessment & Plan Note (Signed)
He is slowly improving post CABG.  His preop LV function was actually pretty good, so hopefully he will recover.  He is uncertain about the ability to return to what some of his work requires.  We discussed the federal disability process in some detail.

## 2012-03-23 NOTE — Assessment & Plan Note (Signed)
These will need to be repeated now that he is post CABG.

## 2012-03-23 NOTE — Assessment & Plan Note (Signed)
Preop PFTs are all pretty good.  If he refrains from smoking he should do pretty well.

## 2012-03-26 ENCOUNTER — Ambulatory Visit (INDEPENDENT_AMBULATORY_CARE_PROVIDER_SITE_OTHER): Payer: Medicaid Other | Admitting: Cardiology

## 2012-03-26 DIAGNOSIS — E7800 Pure hypercholesterolemia, unspecified: Secondary | ICD-10-CM

## 2012-03-26 DIAGNOSIS — I2510 Atherosclerotic heart disease of native coronary artery without angina pectoris: Secondary | ICD-10-CM

## 2012-03-26 NOTE — Progress Notes (Signed)
Exercise Treadmill Test  Pre-Exercise Testing Evaluation Rhythm: normal sinus  Rate: 75   PR:  .16 QRS:  .11  QT:  .36 QTc: .40           Test  Exercise Tolerance Test Ordering MD: [name], MD  Interpreting MD: [name], MD  Unique Test No: 1  Treadmill:  1  Indication for ETT: CAD  Contraindication to ETT: No   Stress Modality: exercise - treadmill  Cardiac Imaging Performed: non   Protocol: standard Bruce - maximal  Max BP:  193/97  Max MPHR (bpm):  170 85% MPR (bpm):  145  MPHR obtained (bpm):  120 % MPHR obtained:  70  Reached 85% MPHR (min:sec):  NA Total Exercise Time (min-sec):  6:00  Workload in METS:  7 Borg Scale: 13  Reason ETT Terminated:  Knee pain    ST Segment Analysis At Rest: normal ST segments - no evidence of significant ST depression With Exercise: no evidence of significant ST depression  Other Information Arrhythmia:  No Angina during ETT:  absent (0) Quality of ETT:  non-diagnostic  ETT Interpretation:  normal - no evidence of ischemia by ST analysis  Comments: Patient exercised for 6 minutes  (actually 8 minutes due to machine malfunction). He had no ST depression and no evidence of ischemia, but submaximal.  His knee pain stopped his exercise.  No chest pain.  Negative clinically and electrically.  Late post procedure ECG was normal.    Recommendations: Continue risk factor reduction.   He will continue to work. He will need to stay off of high power lines.

## 2012-05-15 DIAGNOSIS — Z0271 Encounter for disability determination: Secondary | ICD-10-CM

## 2012-07-01 ENCOUNTER — Ambulatory Visit: Payer: Self-pay | Admitting: Cardiology

## 2012-07-22 ENCOUNTER — Ambulatory Visit (INDEPENDENT_AMBULATORY_CARE_PROVIDER_SITE_OTHER): Payer: Medicaid Other | Admitting: Cardiology

## 2012-07-22 ENCOUNTER — Encounter: Payer: Self-pay | Admitting: Cardiology

## 2012-07-22 VITALS — BP 112/80 | HR 81 | Ht 70.0 in | Wt 224.0 lb

## 2012-07-22 DIAGNOSIS — E782 Mixed hyperlipidemia: Secondary | ICD-10-CM

## 2012-07-22 DIAGNOSIS — I2510 Atherosclerotic heart disease of native coronary artery without angina pectoris: Secondary | ICD-10-CM

## 2012-07-22 DIAGNOSIS — Z720 Tobacco use: Secondary | ICD-10-CM

## 2012-07-22 DIAGNOSIS — I10 Essential (primary) hypertension: Secondary | ICD-10-CM

## 2012-07-22 DIAGNOSIS — F17200 Nicotine dependence, unspecified, uncomplicated: Secondary | ICD-10-CM

## 2012-07-22 NOTE — Progress Notes (Signed)
HPI:  He is doing pretty well.  Unfortunately he still is smoking about 2-3 cigarettes per day nonetheless.    Current Outpatient Prescriptions  Medication Sig Dispense Refill  . amLODipine (NORVASC) 5 MG tablet Take 1 tablet (5 mg total) by mouth at bedtime.  30 tablet  6  . aspirin 325 MG tablet Take 325 mg by mouth at bedtime.      . carvedilol (COREG) 6.25 MG tablet Take 1 tablet (6.25 mg total) by mouth 2 (two) times daily.  60 tablet  6  . pravastatin (PRAVACHOL) 40 MG tablet Take 1 tablet (40 mg total) by mouth every evening.  30 tablet  6   No current facility-administered medications for this visit.    Allergies  Allergen Reactions  . Lisinopril Cough  . Tea Hives    Past Medical History  Diagnosis Date  . Coronary artery disease 2002    Lesion in LAD s/p angioplasty; s/p CABG x 4 Sept 2013  . Hypertension   . Hyperlipidemia   . Myocardial infarct   . Tobacco abuse     stopped September 2013  . COPD (chronic obstructive pulmonary disease)   . Poor dentition     Past Surgical History  Procedure Laterality Date  . Knee surgery      left x 2  . Tee without cardioversion  01/02/2012    Procedure: TRANSESOPHAGEAL ECHOCARDIOGRAM (TEE);  Surgeon: Delight Ovens, MD;  Location: [facility] OR;  Service: Open Heart Surgery;  Laterality: N/A;  . Coronary angioplasty with stent placement  11/1997, 11/2000  . Coronary artery bypass graft  01/02/2012    Procedure: CORONARY ARTERY BYPASS GRAFTING (CABG);  Surgeon: Delight Ovens, MD;  Location: [facility] OR;  Service: Open Heart Surgery;  Laterality: N/A;  times three using left internal mammary artery and right endoscopically harvested saphenous vein  . Multiple extractions with alveoloplasty  02/21/2012    Procedure: MULTIPLE EXTRACION WITH ALVEOLOPLASTY;  Surgeon: Charlynne Pander, DDS;  Location: [facility] ORS;  Service: Oral Surgery;  Laterality: N/A;  Extaction of tooth #'s 4,5,6,7,10,11,12,13,14,15,19,20,21,22,23,24,25, 26,27,28,29,32  with alveoloplasty and bilateral mandibular tori reductions     Family History  Problem Relation Age of Onset  . Cancer Neg Hx   . Heart disease Father     History   Social History  . Marital Status: Married    Spouse Name: N/A    Number of Children: 5  . Years of Education: N/A   Occupational History  . Works in [employer]    Social History Main Topics  . Smoking status: Former Smoker -- 2.00 packs/day for 35 years    Types: Cigarettes    Start date: 06/30/1976    Quit date: 12/31/2011  . Smokeless tobacco: Never Used  . Alcohol Use: No  . Drug Use: No  . Sexually Active: Yes   Other Topics Concern  . Not on file   Social History Narrative  . No narrative on file    ROS: Please see the HPI.  All other systems reviewed and negative.  PHYSICAL EXAM:  BP 112/80  Pulse 81  Ht 5\' 10"  (1.778 m)  Wt 224 lb (101.606 kg)  BMI 32.14 kg/m2  SpO2 98%  General: Well developed, well nourished, in no acute distress. Head:  Normocephalic and atraumatic. Neck: no JVD Lungs: Clear to auscultation and percussion. Heart: Normal S1 and S2.  No murmur, rubs or gallops.  Abdomen:  Normal bowel sounds; soft; non tender; no organomegaly Pulses:  Pulses normal in all 4 extremities. Extremities: No clubbing or cyanosis. No edema. Neurologic: Alert and oriented x 3.  EKG:  NSR.  WNL.  ASSESSMENT AND PLAN:  1.  Needs repeat lipids 2.  FU with Dr. Diona Browner in the [place] office.

## 2012-07-23 NOTE — Patient Instructions (Addendum)
Your physician recommends that you have a FASTING LIPID and LIVER Profile--nothing to eat or drink after midnight (order given to the patient)  Your physician wants you to follow-up in: 1 YEAR with Dr Diona Browner in [place] (previous pt of Dr Riley Kill). You will receive a reminder letter in the mail two months in advance. If you don't receive a letter, please call our office to schedule the follow-up appointment.  Your physician recommends that you continue on your current medications as directed. Please refer to the Current Medication list given to you today.

## 2012-07-27 NOTE — Assessment & Plan Note (Addendum)
Probably needs follow up labs at this point.  Last trig was over 500.  Will recheck.

## 2012-07-27 NOTE — Assessment & Plan Note (Signed)
Stable at the current time.  Encourage dc of smoking.

## 2012-07-27 NOTE — Assessment & Plan Note (Signed)
encouraged to quit! 

## 2012-07-27 NOTE — Assessment & Plan Note (Signed)
Controlled at the present time.  

## 2012-10-22 ENCOUNTER — Other Ambulatory Visit: Payer: Self-pay | Admitting: *Deleted

## 2012-10-22 DIAGNOSIS — I2510 Atherosclerotic heart disease of native coronary artery without angina pectoris: Secondary | ICD-10-CM

## 2012-10-22 DIAGNOSIS — E7800 Pure hypercholesterolemia, unspecified: Secondary | ICD-10-CM

## 2012-10-22 MED ORDER — CARVEDILOL 6.25 MG PO TABS: 6.2500 mg | ORAL_TABLET | Freq: Two times a day (BID) | ORAL | Status: DC

## 2012-10-22 MED ORDER — AMLODIPINE BESYLATE 5 MG PO TABS: 5.0000 mg | ORAL_TABLET | Freq: Every day | ORAL | Status: DC

## 2012-10-22 MED ORDER — PRAVASTATIN SODIUM 40 MG PO TABS: 40.0000 mg | ORAL_TABLET | Freq: Every evening | ORAL | Status: DC

## 2012-11-24 ENCOUNTER — Other Ambulatory Visit: Payer: Self-pay | Admitting: *Deleted

## 2012-11-24 ENCOUNTER — Telehealth: Payer: Self-pay | Admitting: Cardiology

## 2012-11-24 DIAGNOSIS — E7800 Pure hypercholesterolemia, unspecified: Secondary | ICD-10-CM

## 2012-11-24 DIAGNOSIS — I2510 Atherosclerotic heart disease of native coronary artery without angina pectoris: Secondary | ICD-10-CM

## 2012-11-24 MED ORDER — AMLODIPINE BESYLATE 5 MG PO TABS: 5.0000 mg | ORAL_TABLET | Freq: Every day | ORAL | Status: DC

## 2012-11-24 MED ORDER — CARVEDILOL 6.25 MG PO TABS: 6.2500 mg | ORAL_TABLET | Freq: Two times a day (BID) | ORAL | Status: DC

## 2012-11-24 MED ORDER — PRAVASTATIN SODIUM 40 MG PO TABS: 40.0000 mg | ORAL_TABLET | Freq: Every evening | ORAL | Status: DC

## 2012-11-24 NOTE — Telephone Encounter (Signed)
amLODipine (NORVASC) 5 MG tablet carvedilol (COREG) 6.25 MG tablet pravastatin (PRAVACHOL) 40 MG tablet  Patient needs medications called in.  Patient is completely out.

## 2012-11-24 NOTE — Telephone Encounter (Signed)
Per absence of DR SM, Dr Dietrich Pates gave verbal to fill medication for the pt, sent via escribe

## 2013-02-10 IMAGING — CR DG CHEST 2V
2 series · 2 of 2 positions shown · non-contrast
Comparison: 01/06/2012

CLINICAL DATA: CABG 01/04/2012.

CHEST - 2 VIEW

[w chest pa]
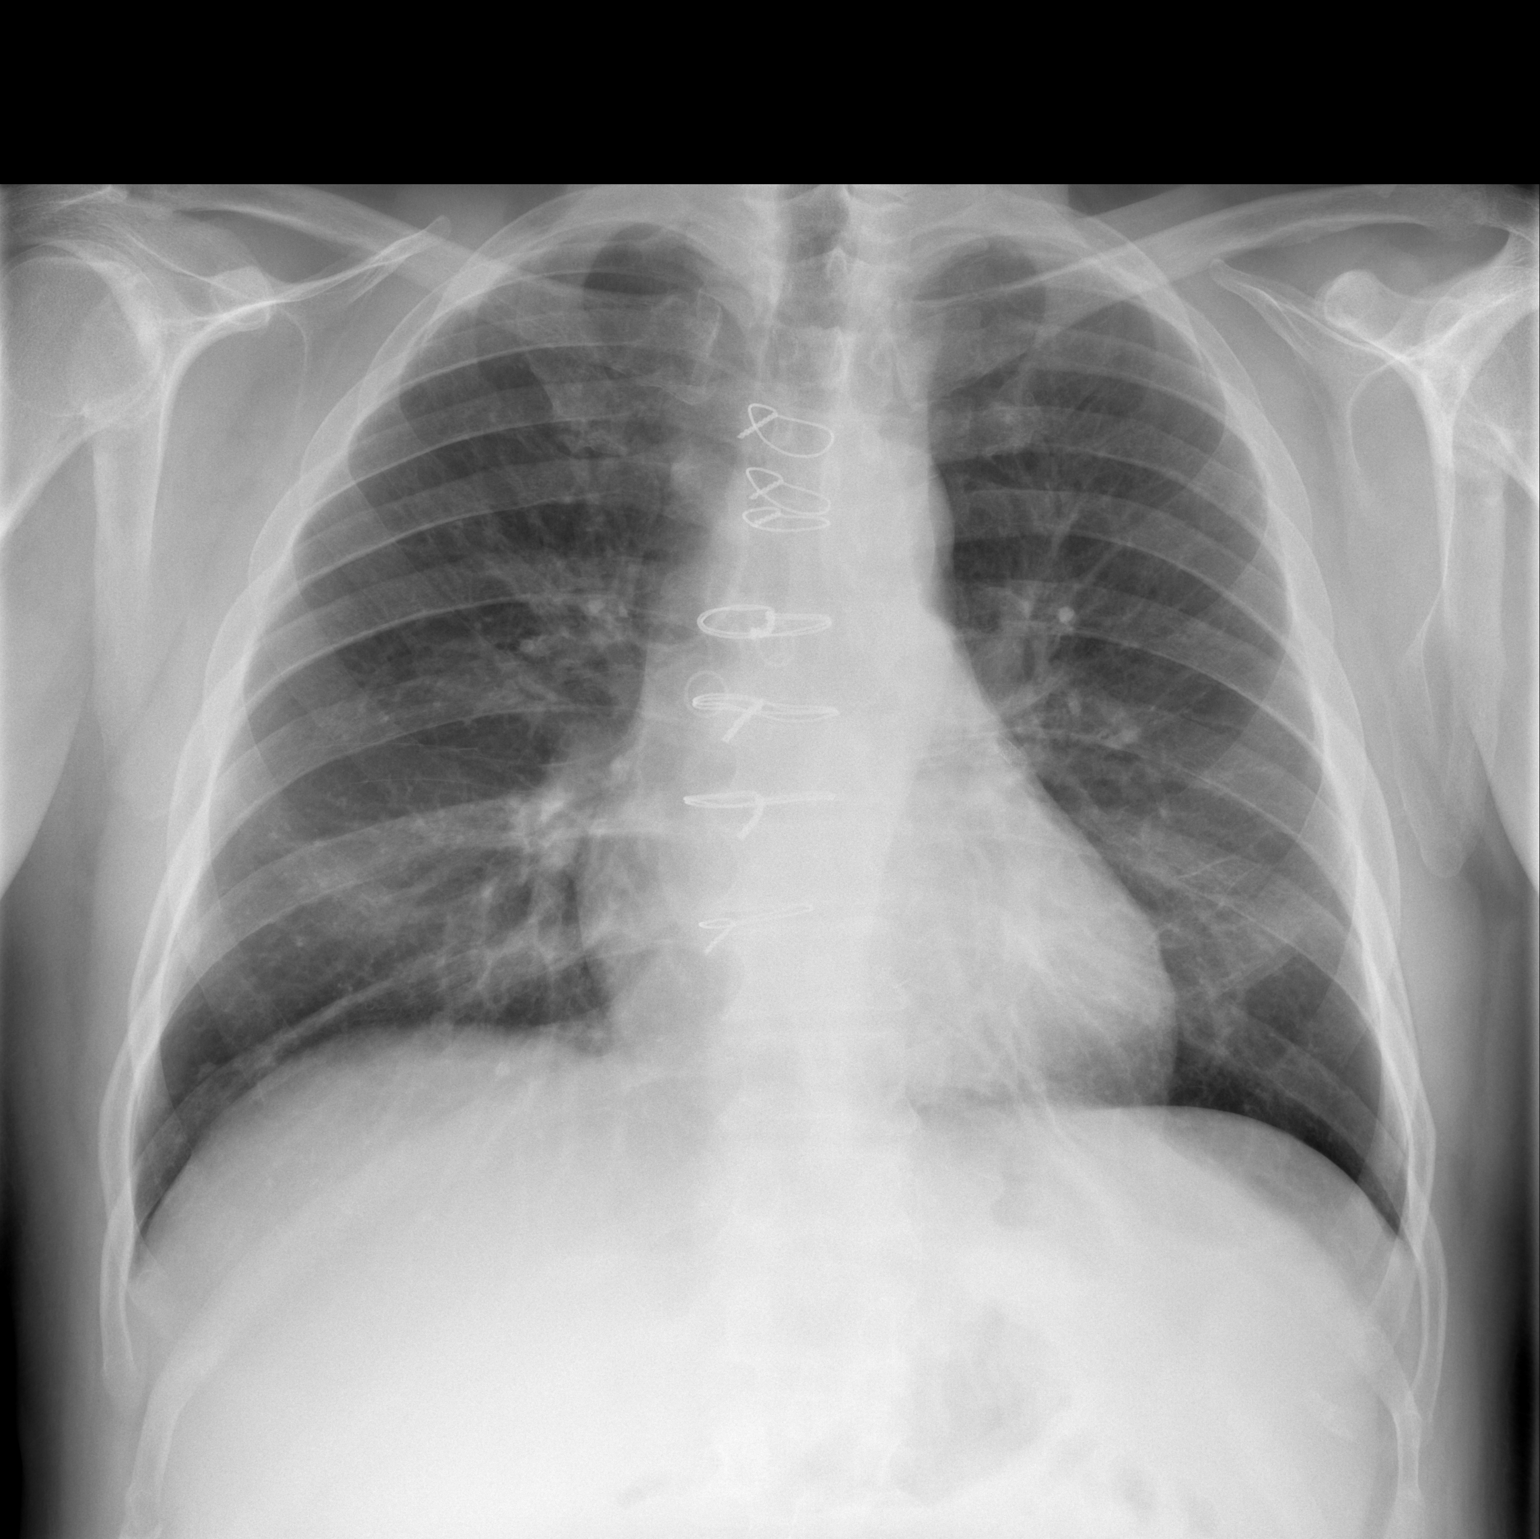

[w chest lat]
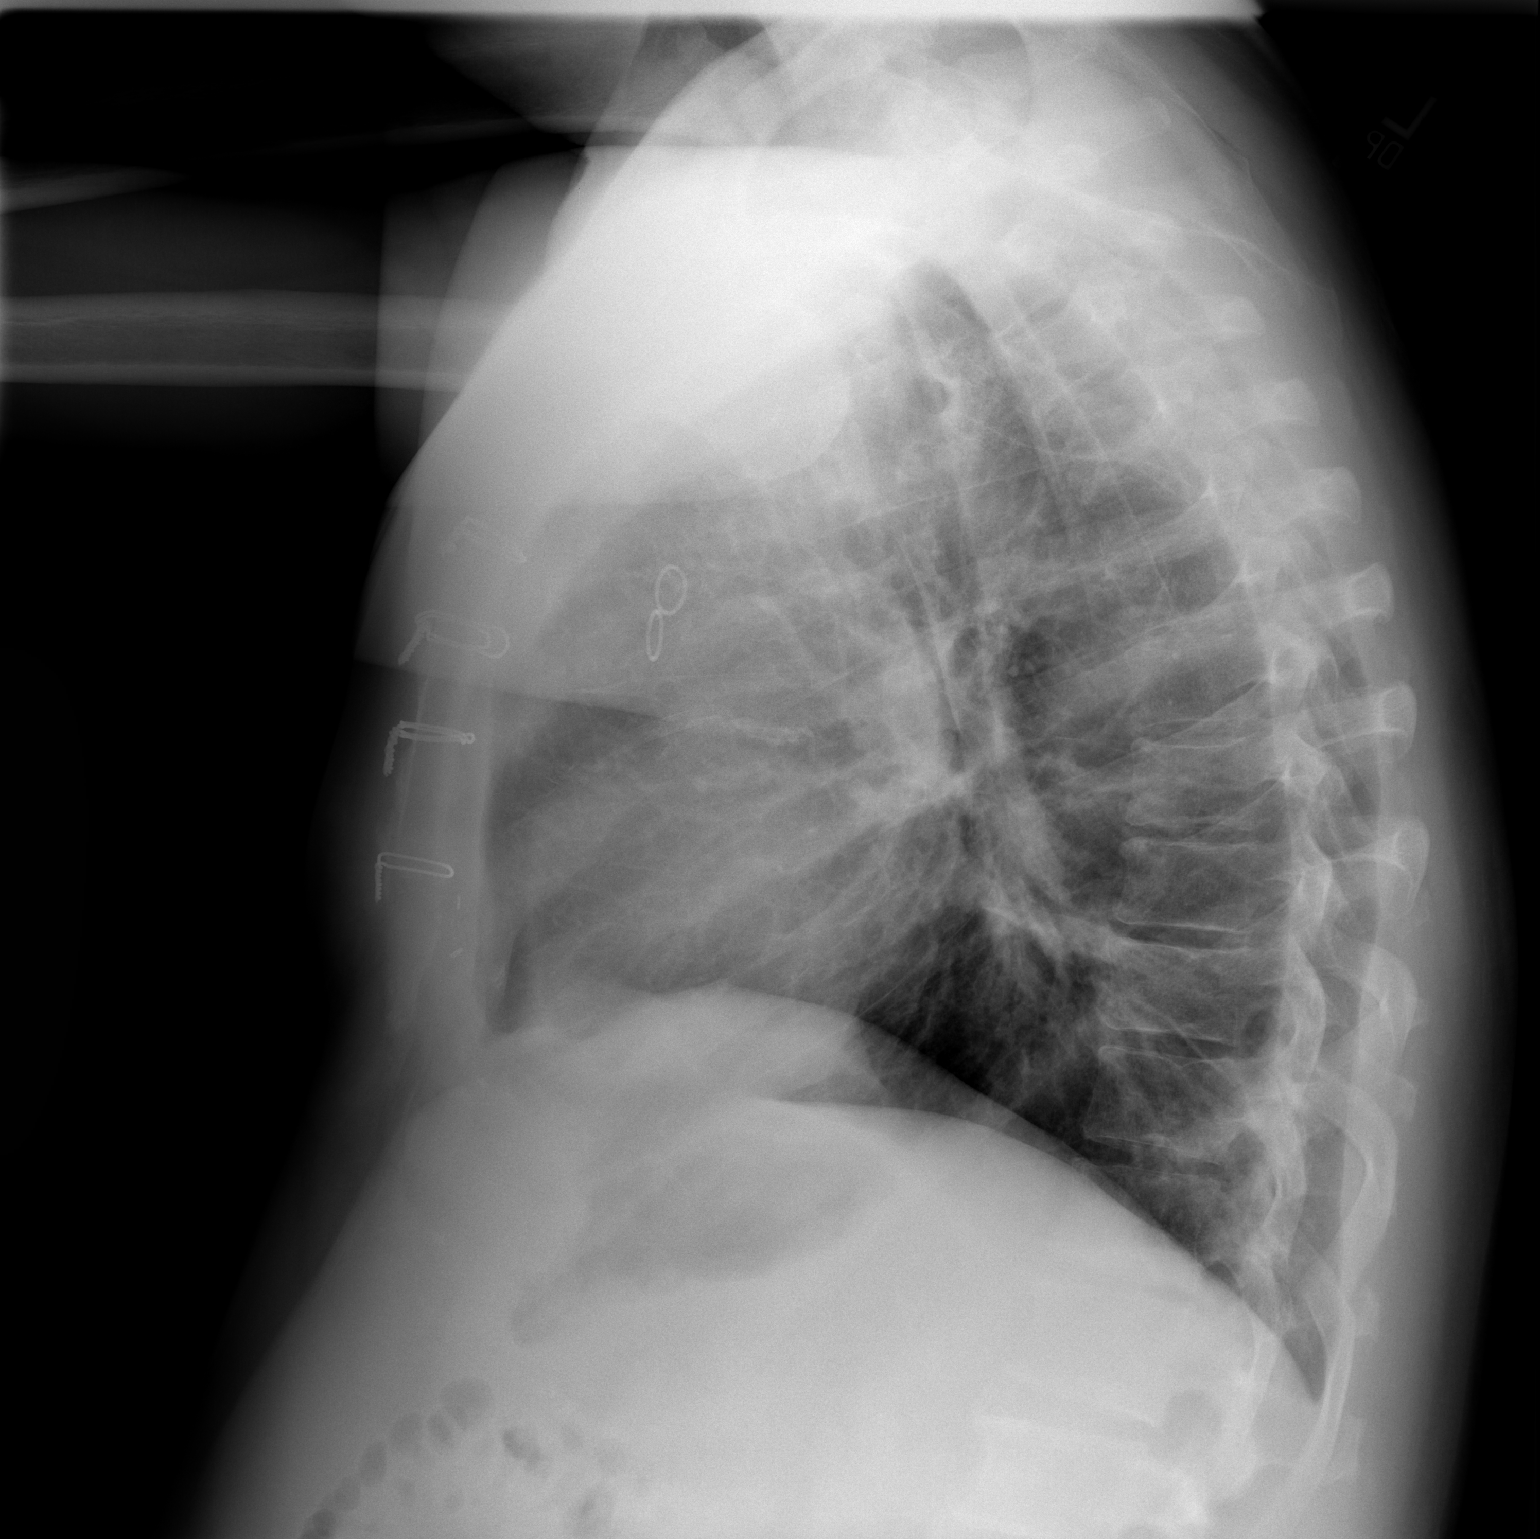

[2 of 2 positions shown; findings below may reference images not displayed]

FINDINGS: Continued improvement in left lower lobe airspace
disease.  Mild amount of residual atelectasis is present in the
left lower lobe.  Right lung is clear.  No pleural effusion is
identified.  There is no heart failure.  Negative for mass or
pneumonia.
IMPRESSION: Near complete clearing of left lower lobe consolidation.

## 2013-06-30 ENCOUNTER — Ambulatory Visit: Payer: Self-pay | Admitting: Nurse Practitioner

## 2013-09-21 ENCOUNTER — Other Ambulatory Visit: Payer: Self-pay | Admitting: Internal Medicine

## 2013-09-21 ENCOUNTER — Telehealth: Payer: Self-pay | Admitting: *Deleted

## 2013-09-21 DIAGNOSIS — I10 Essential (primary) hypertension: Secondary | ICD-10-CM

## 2013-09-21 DIAGNOSIS — I219 Acute myocardial infarction, unspecified: Secondary | ICD-10-CM

## 2013-09-21 NOTE — Telephone Encounter (Signed)
CARVEDILOL 6.25MG  AMLODIPINE 5MG  PRAVASTATIN 40MG   Pt has not had these medication for about a week now. He ran out and did not call in refills. [place]

## 2013-09-21 NOTE — Telephone Encounter (Signed)
Has not been in [place] since 2012. [place] been [place] [address] by other cardiologist since then.

## 2013-09-22 ENCOUNTER — Telehealth: Payer: Self-pay | Admitting: Cardiology

## 2013-09-22 ENCOUNTER — Other Ambulatory Visit: Payer: Self-pay

## 2013-09-22 NOTE — Telephone Encounter (Signed)
CARVEDILOL 6.25MG   AMLODIPINE 5MG   PRAVASTATIN 40MG   Pt has not had these medication for about a week now. He ran out and did not call in refills. [facility]       Patient to establish w/ Dr.McDowell on 10/26/13. / tgs

## 2013-10-26 ENCOUNTER — Ambulatory Visit (INDEPENDENT_AMBULATORY_CARE_PROVIDER_SITE_OTHER): Payer: BC Managed Care – PPO | Admitting: Cardiology

## 2013-10-26 ENCOUNTER — Encounter: Payer: Self-pay | Admitting: Cardiology

## 2013-10-26 VITALS — BP 159/89 | HR 84 | Ht 70.0 in | Wt 225.4 lb

## 2013-10-26 DIAGNOSIS — I2510 Atherosclerotic heart disease of native coronary artery without angina pectoris: Secondary | ICD-10-CM

## 2013-10-26 DIAGNOSIS — F17200 Nicotine dependence, unspecified, uncomplicated: Secondary | ICD-10-CM

## 2013-10-26 DIAGNOSIS — Z720 Tobacco use: Secondary | ICD-10-CM

## 2013-10-26 DIAGNOSIS — I10 Essential (primary) hypertension: Secondary | ICD-10-CM

## 2013-10-26 DIAGNOSIS — E782 Mixed hyperlipidemia: Secondary | ICD-10-CM

## 2013-10-26 MED ORDER — AMLODIPINE BESYLATE 5 MG PO TABS: 5.0000 mg | ORAL_TABLET | Freq: Every day | ORAL | Status: DC

## 2013-10-26 MED ORDER — PRAVASTATIN SODIUM 40 MG PO TABS: 40.0000 mg | ORAL_TABLET | Freq: Every day | ORAL | Status: DC

## 2013-10-26 MED ORDER — CARVEDILOL 6.25 MG PO TABS: 6.2500 mg | ORAL_TABLET | Freq: Two times a day (BID) | ORAL | Status: DC

## 2013-10-26 NOTE — Progress Notes (Signed)
Clinical Summary [name] is a 53 y.o.male former patient of Dr. Lia Foyer, last seen in April 2014. This is our first meeting in the office. I reviewed his records. He presents today with his wife, denies any angina symptoms or unusual shortness of breath. 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  He states that he has run out of all his medications and needs refills. ECG reviewed today shows normal sinus rhythm with nonspecific T-wave changes.  He did start smoking again after initially stopping after bypass surgery. We discussed the critical importance of smoking cessation.  He has no regular primary care provider at this point.   Allergies  Allergen Reactions  . Lisinopril Cough  . Tea Hives    Current Outpatient Prescriptions  Medication Sig Dispense Refill  . amLODipine (NORVASC) 5 MG tablet Take 1 tablet (5 mg total) by mouth daily.  90 tablet  3  . aspirin 325 MG tablet Take 325 mg by mouth at bedtime.      . carvedilol (COREG) 6.25 MG tablet Take 1 tablet (6.25 mg total) by mouth 2 (two) times daily with a meal.  180 tablet  3  . pravastatin (PRAVACHOL) 40 MG tablet Take 1 tablet (40 mg total) by mouth daily.  90 tablet  3   No current facility-administered medications for this visit.    Past Medical History  Diagnosis Date  . Coronary artery disease 2002    Lesion in LAD s/p angioplasty; s/p CABG x 4 Sept 2013  . Essential hypertension, benign   . Hyperlipidemia   . Myocardial infarct   . Tobacco abuse   . COPD (chronic obstructive pulmonary disease)     Past Surgical History  Procedure Laterality Date  . Knee surgery      left x 2  . Tee without cardioversion  01/02/2012    Procedure: TRANSESOPHAGEAL ECHOCARDIOGRAM (TEE);  Surgeon: [name], MD;  Location: [place];  Service: Open Heart Surgery;  Laterality: N/A;  . Coronary artery bypass graft  01/02/2012    Procedure: CORONARY ARTERY BYPASS GRAFTING (CABG);  Surgeon: [name], MD;   Location: [place];  Service: Open Heart Surgery;  Laterality: N/A;  times three using left internal mammary artery and right endoscopically harvested saphenous vein  . Multiple extractions with alveoloplasty  02/21/2012    Procedure: MULTIPLE EXTRACION WITH ALVEOLOPLASTY;  Surgeon: Lenn Cal, DDS;  Location: [facility];  Service: Oral Surgery;  Laterality: N/A;  Extaction of tooth #'s 4,5,6,7,10,11,12,13,14,15,19,20,21,22,23,24,25,    Social History Mr. Buss reports that he quit smoking about 21 months ago. His smoking use included Cigarettes. He started smoking about 37 years ago. He has a 70 pack-year smoking history. He has never used smokeless tobacco. Mr. Stolp reports that he does not drink alcohol.  Review of Systems No palpitations, dizziness, syncope, claudication. No unusual bleeding problems. Stable appetite. Other systems reviewed and negative.  Physical Examination Filed Vitals:   10/26/13 1600  BP: 159/89  Pulse: 84   Filed Weights   10/26/13 1600  Weight: 225 lb 6.4 oz (102.241 kg)   Overweight male, appears comfortable at rest. HEENT: Conjunctiva and lids normal, oropharynx clear. Neck: Supple, no elevated JVP or carotid bruits, no thyromegaly. Lungs: Clear to auscultation, nonlabored breathing at rest. Thorax: Well-healed sternal incision. Cardiac: Regular rate and rhythm, no S3 or significant systolic murmur, no pericardial rub. Abdomen: Soft, nontender, no guarding or rebound. Extremities: No pitting edema, distal pulses 2+. Skin: Warm  and dry. Musculoskeletal: No kyphosis. Neuropsychiatric: Alert and oriented x3, affect grossly appropriate.   Problem List and Plan   CAD, NATIVE VESSEL Multivessel disease status post CABG in September 2013. He has been symptomatically stable. ECG reviewed. Refills provided for medications including Norvasc, Coreg, and Pravachol. Six-month followup arranged.  HYPERTENSION, BENIGN Blood pressure elevated today, although  he has been out of his medications recently. Refills provided.  Tobacco abuse Smoking cessation discussed.  Mixed hyperlipidemia Followup FLP and LFT.    [name], M.D., F.A.C.C.

## 2013-10-26 NOTE — Assessment & Plan Note (Signed)
Multivessel disease status post CABG in September 2013. He has been symptomatically stable. ECG reviewed. Refills provided for medications including Norvasc, Coreg, and Pravachol. Six-month followup arranged.

## 2013-10-26 NOTE — Assessment & Plan Note (Signed)
Smoking cessation discussed 

## 2013-10-26 NOTE — Patient Instructions (Addendum)
Your physician wants you to follow-up in: 6 months You will receive a reminder letter in the mail two months in advance. If you don't receive a letter, please call our office to schedule the follow-up appointment.     Your physician recommends that you continue on your current medications as directed. Please refer to the Current Medication list given to you today.      Thank you for choosing [facility] !        

## 2013-10-26 NOTE — Assessment & Plan Note (Signed)
Blood pressure elevated today, although he has been out of his medications recently. Refills provided.

## 2013-10-26 NOTE — Assessment & Plan Note (Signed)
Followup FLP and LFT.

## 2014-04-01 ENCOUNTER — Encounter (HOSPITAL_COMMUNITY): Payer: Self-pay | Admitting: Cardiology

## 2014-11-12 ENCOUNTER — Other Ambulatory Visit: Payer: Self-pay | Admitting: Cardiology

## 2015-01-04 ENCOUNTER — Encounter: Payer: Self-pay | Admitting: Family Medicine

## 2015-01-13 ENCOUNTER — Ambulatory Visit: Payer: 59 | Admitting: Family Medicine

## 2015-01-18 ENCOUNTER — Emergency Department (HOSPITAL_COMMUNITY): Payer: 59

## 2015-01-18 ENCOUNTER — Emergency Department (HOSPITAL_COMMUNITY)
Admission: EM | Admit: 2015-01-18 | Discharge: 2015-01-18 | Disposition: A | Discharge status: Home / Self Care | Payer: 59 | Attending: Emergency Medicine | Admitting: Emergency Medicine

## 2015-01-18 ENCOUNTER — Encounter (HOSPITAL_COMMUNITY): Payer: Self-pay | Admitting: Emergency Medicine

## 2015-01-18 DIAGNOSIS — Z951 Presence of aortocoronary bypass graft: Secondary | ICD-10-CM | POA: Diagnosis not present

## 2015-01-18 DIAGNOSIS — J441 Chronic obstructive pulmonary disease with (acute) exacerbation: Secondary | ICD-10-CM | POA: Diagnosis not present

## 2015-01-18 DIAGNOSIS — R059 Cough, unspecified: Secondary | ICD-10-CM

## 2015-01-18 DIAGNOSIS — Z9889 Other specified postprocedural states: Secondary | ICD-10-CM | POA: Diagnosis not present

## 2015-01-18 DIAGNOSIS — R109 Unspecified abdominal pain: Secondary | ICD-10-CM | POA: Diagnosis not present

## 2015-01-18 DIAGNOSIS — I10 Essential (primary) hypertension: Secondary | ICD-10-CM | POA: Insufficient documentation

## 2015-01-18 DIAGNOSIS — Z7982 Long term (current) use of aspirin: Secondary | ICD-10-CM | POA: Insufficient documentation

## 2015-01-18 DIAGNOSIS — I2510 Atherosclerotic heart disease of native coronary artery without angina pectoris: Secondary | ICD-10-CM | POA: Diagnosis not present

## 2015-01-18 DIAGNOSIS — C3491 Malignant neoplasm of unspecified part of right bronchus or lung: Secondary | ICD-10-CM | POA: Insufficient documentation

## 2015-01-18 DIAGNOSIS — Z720 Tobacco use: Secondary | ICD-10-CM | POA: Diagnosis not present

## 2015-01-18 DIAGNOSIS — Z9861 Coronary angioplasty status: Secondary | ICD-10-CM | POA: Insufficient documentation

## 2015-01-18 DIAGNOSIS — E785 Hyperlipidemia, unspecified: Secondary | ICD-10-CM | POA: Insufficient documentation

## 2015-01-18 DIAGNOSIS — I252 Old myocardial infarction: Secondary | ICD-10-CM | POA: Insufficient documentation

## 2015-01-18 DIAGNOSIS — R05 Cough: Secondary | ICD-10-CM | POA: Diagnosis present

## 2015-01-18 DIAGNOSIS — Z79899 Other long term (current) drug therapy: Secondary | ICD-10-CM | POA: Diagnosis not present

## 2015-01-18 LAB — CBC WITH DIFFERENTIAL/PLATELET
BASOS PCT: 0 %
Basophils Absolute: 0 10*3/uL (ref 0.0–0.1)
EOS ABS: 0.3 10*3/uL (ref 0.0–0.7)
Eosinophils Relative: 3 %
HCT: 40.3 % (ref 39.0–52.0)
Hemoglobin: 13.7 g/dL (ref 13.0–17.0)
Lymphocytes Relative: 22 %
Lymphs Abs: 2 10*3/uL (ref 0.7–4.0)
MCH: 30.4 pg (ref 26.0–34.0)
MCHC: 34 g/dL (ref 30.0–36.0)
MCV: 89.4 fL (ref 78.0–100.0)
MONOS PCT: 7 %
Monocytes Absolute: 0.6 10*3/uL (ref 0.1–1.0)
Neutro Abs: 6 10*3/uL (ref 1.7–7.7)
Neutrophils Relative %: 68 %
Platelets: 196 10*3/uL (ref 150–400)
RBC: 4.51 MIL/uL (ref 4.22–5.81)
RDW: 13.7 % (ref 11.5–15.5)
WBC: 8.9 10*3/uL (ref 4.0–10.5)

## 2015-01-18 LAB — BASIC METABOLIC PANEL
Anion gap: 8 (ref 5–15)
BUN: 8 mg/dL (ref 6–20)
CALCIUM: 8.3 mg/dL — AB (ref 8.9–10.3)
CO2: 27 mmol/L (ref 22–32)
CREATININE: 0.99 mg/dL (ref 0.61–1.24)
Chloride: 99 mmol/L — ABNORMAL LOW (ref 101–111)
GFR calc Af Amer: >60 mL/min (ref >60)
GFR calc non Af Amer: >60 mL/min (ref >60)
Glucose, Bld: 134 mg/dL — ABNORMAL HIGH (ref 65–99)
Potassium: 4.1 mmol/L (ref 3.5–5.1)
SODIUM: 134 mmol/L — AB (ref 135–145)

## 2015-01-18 MED ORDER — IOHEXOL 300 MG/ML  SOLN
75.0000 mL | Freq: Once | INTRAMUSCULAR | Status: AC | PRN
  Administered 2015-01-18: 75 mL via INTRAVENOUS

## 2015-01-18 MED ORDER — HYDROCODONE-ACETAMINOPHEN 5-325 MG PO TABS: 1.0000 | ORAL_TABLET | ORAL | Status: DC | PRN

## 2015-01-18 NOTE — ED Notes (Signed)
Pt came back to room and willing to do Chest CT today

## 2015-01-18 NOTE — Discharge Instructions (Signed)
The cancer center will call you today for an appointment. You will need urgent follow-up and treatment for urine Cancer. If you were given medicines take as directed.  If you are on coumadin or contraceptives realize their levels and effectiveness is altered by many different medicines.  If you have any reaction (rash, tongues swelling, other) to the medicines stop taking and see a physician.    If your blood pressure was elevated in the ER make sure you follow up for management with a primary doctor or return for chest pain, shortness of breath or stroke symptoms.  Please follow up as directed and return to the ER or see a physician for new or worsening symptoms.  Thank you. Filed Vitals:   01/18/15 0917 01/18/15 0930 01/18/15 1000 01/18/15 1328  BP: 144/90 122/79 118/93 134/91  Pulse: 84 87 83 91  Temp: 98.1 F (36.7 C)   97.6 F (36.4 C)  TempSrc: Oral   Oral  Resp: 16   14  Height: '5\' 10"'$  (1.778 m)     Weight: 225 lb (102.059 kg)     SpO2: 98% 96% 95% 97%

## 2015-01-18 NOTE — ED Provider Notes (Signed)
CSN: [id number]     Arrival date & time 01/18/15  0905 History  By signing my name below, I, [name], attest that this documentation has been prepared under the direction and in the presence of [name] Morrison, MD. Electronically Signed: Terressa [name], ED Scribe. 01/18/2015. 10:35 AM.   Chief Complaint  Patient presents with  . Cough   HPI PCP: [name] Fraction, MD HPI Comments: [name] is a 54 y.o. male, with PMHx noted below including HTN and daily tobacco use (2ppd) -- pt notes he had a PFTs which showed he was incorrectly Dx with COPD, who presents to the Emergency Department complaining of intermittent right sided rib cage pain radiating to the right flank onset this morning with associated intermittent productive cough with clear sputum and runny nose onset couple of weeks ago. Pt denies chest pain, SOB, recent injuries or fall, abd pain, neck pain, swelling LE, hemoptysis, Hx of cancer, or any other Sx at this time.   Past Medical History  Diagnosis Date  . Coronary artery disease 2002    Lesion in LAD s/p angioplasty; s/p CABG x 4 Sept 2013  . Tobacco abuse   . COPD (chronic obstructive pulmonary disease)   . Myocardial infarct [id number]    stents  . Hyperlipidemia 1999  . Essential hypertension, benign 1999   Past Surgical History  Procedure Laterality Date  . Knee surgery      left x 2  ACL repair  . Tee without cardioversion  01/02/2012    Procedure: TRANSESOPHAGEAL ECHOCARDIOGRAM (TEE);  Surgeon: [name], MD;  Location: [facility];  Service: Open Heart Surgery;  Laterality: N/A;  . Multiple extractions with alveoloplasty  02/21/2012    Procedure: MULTIPLE EXTRACION WITH ALVEOLOPLASTY;  Surgeon: Lenn Cal, DDS;  Location: [facility];  Service: Oral Surgery;  Laterality: N/A;  Extaction of tooth #'s 4,5,6,7,10,11,12,13,14,15,19,20,21,22,23,24,25,   . Left heart catheterization with coronary angiogram N/A 01/01/2012    Procedure: LEFT HEART CATHETERIZATION  WITH CORONARY ANGIOGRAM;  Surgeon: Hillary Bow, MD;  Location: [facility];  Service: Cardiovascular;  Laterality: N/A;  . Coronary artery bypass graft  01/02/2012    Procedure: CORONARY ARTERY BYPASS GRAFTING (CABG);  Surgeon: [name], MD;  Location: [facility];  Service: Open Heart Surgery;  Laterality: N/A;  times three using left internal mammary artery and right endoscopically harvested saphenous vein   Family History  Problem Relation Age of Onset  . Cancer Neg Hx   . Heart disease Father   . Diabetes Father   . Hyperlipidemia Father   . Hypertension Father   . Hyperlipidemia Mother   . Hypertension Mother   . Stroke Mother   . Diabetes Paternal Grandmother    Social History  Substance Use Topics  . Smoking status: Current Every Day Smoker -- 2.00 packs/day for 35 years    Types: Cigarettes    Start date: 06/30/1976  . Smokeless tobacco: Never Used  . Alcohol Use: Yes     Comment: maybe a 12 pack/mth if that    Review of Systems  Constitutional: Negative for fever.  HENT: Positive for rhinorrhea.   Respiratory: Positive for cough. Negative for shortness of breath.   Cardiovascular: Negative for chest pain and leg swelling.  Gastrointestinal: Negative for abdominal pain.  Genitourinary: Positive for flank pain (right).  Musculoskeletal: Negative for neck pain.       Right sided rib cage pain     Allergies  Lisinopril and Tea  Home Medications   Prior to Admission medications   Medication Sig Start Date End Date Taking? Authorizing Provider  amLODipine (NORVASC) 5 MG tablet TAKE ONE (1) TABLET BY MOUTH EVERY DAY 11/12/14  Yes [name], MD  aspirin EC 81 MG tablet Take 81 mg by mouth daily.   Yes Historical Provider, MD  carvedilol (COREG) 6.25 MG tablet TAKE ONE TABLET BY MOUTH TWICE A DAY WITH A MEAL 11/12/14  Yes [name], MD  ibuprofen (ADVIL,MOTRIN) 200 MG tablet Take 600 mg by mouth every 6 (six) hours as needed.   Yes Historical  Provider, MD  pravastatin (PRAVACHOL) 40 MG tablet TAKE ONE (1) TABLET BY MOUTH EVERY DAY 11/12/14  Yes [name], MD   Triage Vitals: BP 144/90 mmHg  Pulse 84  Temp(Src) 98.1 F (36.7 C) (Oral)  Resp 16  Ht '5\' 10"'$  (1.778 m)  Wt 225 lb (102.059 kg)  BMI 32.28 kg/m2  SpO2 98% Physical Exam  Constitutional: He is oriented to person, place, and time. He appears well-developed and well-nourished.  HENT:  Head: Normocephalic.  Eyes: EOM are normal.  Neck: Normal range of motion.  Pulmonary/Chest: Effort normal. He has wheezes (sparse, right side). He has rales (sparse, right side).  Abdominal: He exhibits no distension.  Musculoskeletal: Normal range of motion.  Neurological: He is alert and oriented to person, place, and time.  Psychiatric: He has a normal mood and affect.  Nursing note and vitals reviewed.   ED Course  Procedures (including critical care time) DIAGNOSTIC STUDIES: Oxygen Saturation is 98% on ra, nl by my interpretation.    COORDINATION OF CARE: 10:06 AM: Discussed treatment plan which includes chest xray with pt at bedside; patient verbalizes understanding and agrees with treatment plan. 10:56 AM: Recheck with pt, discussed imaging results with pt and his wife. Discussed CT scan and oncology referral.   Imaging Review Dg Chest 2 View  01/18/2015   CLINICAL DATA:  Cough.  RIGHT flank pain.  EXAM: CHEST  2 VIEW  COMPARISON:  01/31/2012.  FINDINGS: RIGHT hilar mass is present. There is probably a component of postobstructive pneumonia. The RIGHT hilar mass measures 6.8 cm x 7.6 cm. This is a new finding compared to 01/31/2012. Median sternotomy/ CABG. Small RIGHT pleural effusion, potentially malignant or parapneumonic effusion. Cardiopericardial silhouette within normal limits for size.  IMPRESSION: 6.8 cm x 7.6 cm RIGHT hilar mass most compatible with bronchogenic carcinoma. Followup chest CT with contrast recommended for further assessment.   Electronically  Signed   By: Dereck Ligas M.D.   On: 01/18/2015 10:36   Ct Chest W Contrast  01/18/2015   CLINICAL DATA:  New diagnosis of lung cancer on chest radiograph earlier today. Further evaluation recommended. RIGHT hilar mass.  EXAM: CT CHEST WITH CONTRAST  TECHNIQUE: Multidetector CT imaging of the chest was performed during intravenous contrast administration.  CONTRAST:  1m OMNIPAQUE IOHEXOL 300 MG/ML  SOLN  COMPARISON:  01/18/2015 chest radiograph.  FINDINGS: Musculoskeletal: Thoracic vertebral body height is preserved. Median sternotomy/ CABG. Central canal grossly appears patent by CT. No destructive rib lesions or transthoracic spread of tumor.  Lungs: Centrilobular and paraseptal emphysema is present. Very large RIGHT hilar mass extends cranial to the RIGHT pulmonary artery and pulmonary hilum. The mass measures 8.7 cm transverse by 8.7 cm AP. The mass is centered in the RIGHT hilum with mediastinal invasion.  There is atelectasis and airspace disease distal to the mass suspicious for postobstructive pneumonia in the RIGHT  upper lobe.  Central airways: The RIGHT upper lobe bronchus is completely occluded. Bronchus cut off is present on the coronal images (image 64 series 4). Trachea and LEFT central airways patent. The RIGHT middle and RIGHT lower lobe bronchi remain patent.  Vasculature: Mass encases the RIGHT pulmonary artery and almost completely obliterates the lumen. No acute arterial abnormality. CABG. Descending aortic atherosclerosis with mural plaque and calcification.  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 The azygos vein is engorged, probably representing a source of collateral flow around the obstructed superior vena cava.  Effusions: Small RIGHT dependently layering pleural effusion which may be reactive to airspace disease or malignant. No LEFT pleural effusion is evident. No pericardial effusion.  Lymphadenopathy: No axillary adenopathy. No LEFT hilar adenopathy.  Precarinal and LEFT paratracheal lymph nodes are enlarged. Enlarged node measuring 14 mm x 19 mm just to the RIGHT of the AP window.  Esophagus: Grossly normal.  Upper abdomen: Both adrenal glands are within normal limits. No hepatic metastases are visible. There is a mass in the body of the pancreas with mass effect on the adjacent splenic vein. This mass measures 3.4 cm x 2.6 cm.  Other: None.  IMPRESSION: 1. 8.7 x 8.7 cm RIGHT hilar mass encasing the RIGHT pulmonary artery and occluding the RIGHT upper lobe bronchus. Primary bronchogenic carcinoma favored over metastatic disease in this patient with emphysema. 2. Mild airspace disease in the peripheral RIGHT upper lobe, probably postobstructive pneumonia. 3. Small RIGHT pleural effusion which may be malignant or reactive. 4. 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 Correlate for SVC syndrome findings. 5. 34 mm x 26 mm mass in the body of the pancreas, partially visible. Metastatic disease versus pancreatic adenocarcinoma are the primary considerations.   Electronically Signed   By: Dereck Ligas M.D.   On: 01/18/2015 12:54   I have personally reviewed and evaluated these images as part of my medical decision-making.  MDM   Final diagnoses:  Bronchogenic carcinoma of right lung  Cough   Patient presents with worsening cough for 2 weeks. Chest x-ray reviewed by myself showing new mass right lung. Discussed with patient in detail regarding concern for cancer specialist with smoking history. Patient initially very upset and left the ER however once, he came back and agreed with CT scan for further detail to help with follow-up oncology office. Patient not requiring oxygen vitals unremarkable. Patient stable for close outpatient follow-up, I discussed with oncology who will call him today and arrange urgent follow-up for treatment options. Results and differential diagnosis were discussed with the  patient/parent/guardian. Xrays were independently reviewed by myself.  Close follow up outpatient was discussed, comfortable with the plan.   Medications  iohexol (OMNIPAQUE) 300 MG/ML solution 75 mL (75 mLs Intravenous Contrast Given 01/18/15 1214)    Filed Vitals:   01/18/15 0917 01/18/15 0930 01/18/15 1000 01/18/15 1328  BP: 144/90 122/79 118/93 134/91  Pulse: 84 87 83 91  Temp: 98.1 F (36.7 C)   97.6 F (36.4 C)  TempSrc: Oral   Oral  Resp: 16   14  Height: '5\' 10"'$  (1.778 m)     Weight: 225 lb (102.059 kg)     SpO2: 98% 96% 95% 97%    Final diagnoses:  Bronchogenic carcinoma of right lung  Cough      [name] Morrison, MD 01/18/15 1334

## 2015-01-18 NOTE — ED Notes (Signed)
MD at bedside.- EDP reviewed CXR results with pt., pt became very upset and refused Chest CT.  Pt left room, wife is going to try talk pt into doing CT today.

## 2015-01-18 NOTE — ED Notes (Addendum)
Pt c/o of cough, nasal drainage and RT rib cage pain that increases with with coughing x 1 week. Pt denies recent injury/fall. Denies CP/SOB.

## 2015-01-19 ENCOUNTER — Ambulatory Visit (INDEPENDENT_AMBULATORY_CARE_PROVIDER_SITE_OTHER): Payer: 59 | Admitting: Cardiothoracic Surgery

## 2015-01-19 ENCOUNTER — Encounter: Payer: Self-pay | Admitting: Cardiothoracic Surgery

## 2015-01-19 ENCOUNTER — Other Ambulatory Visit: Payer: Self-pay | Admitting: *Deleted

## 2015-01-19 VITALS — BP 135/79 | HR 88 | Resp 20 | Ht 70.0 in | Wt 225.0 lb

## 2015-01-19 DIAGNOSIS — K8689 Other specified diseases of pancreas: Secondary | ICD-10-CM

## 2015-01-19 DIAGNOSIS — K869 Disease of pancreas, unspecified: Secondary | ICD-10-CM

## 2015-01-19 DIAGNOSIS — Z951 Presence of aortocoronary bypass graft: Secondary | ICD-10-CM

## 2015-01-19 DIAGNOSIS — R918 Other nonspecific abnormal finding of lung field: Secondary | ICD-10-CM | POA: Diagnosis not present

## 2015-01-19 NOTE — Patient Instructions (Signed)
Lung Cancer  Lung cancer is an abnormal growth of cells in one or both of your lungs. These extra cells may form a mass of tissue called a growth or tumor. Tumors can be either cancerous (malignant) or not cancerous (benign).   Lung cancer is the most common cause of cancer death in men and women. There are several different types of lung cancers. Usually, lung cancer is described as either small cell lung cancer or nonsmall cell lung cancer. Other types of cancer occur in the lungs, including carcinoid and cancers spread from other organs. The types of cancer have different behavior and treatment.  RISK FACTORS  Smoking is the most common risk factor for developing lung cancer. Other risk factors include:  · Radon gas exposure.  · Asbestos and other industrial substance exposure.  · Second hand tobacco smoke.  · Air pollution.  · Family or personal history of lung cancer.  · Age older than 65 years.  CAUSES   Lung cancer usually starts when the lungs are exposed to harmful chemicals. Smoking is the most common risk factor for lung cancer. When you quit smoking, your risk of lung cancer falls each year (but is never the same as a person who has never smoked).   SYMPTOMS   Lung cancer may not have any symptoms in its early stages. The symptoms can depend on the type of cancer, its location, and other factors. Symptoms can include:  · Cough (either new, different, or more severe).  · Shortness of breath.  · Coughing up blood (hemoptysis).  · Chest pain.  · Hoarseness.  · Swelling of the face.  · Drooping eyelid.  · Changes in blood tests, such as low sodium (hyponatremia), high calcium (hypercalcemia), or low blood count (anemia).  · Weight loss.  DIAGNOSIS   Your health care provider may suspect lung cancer based on your symptoms or based on tests obtained for other reasons. Tests or procedures used to find or confirm the presence of lung cancer may include:  · Chest X-ray.  · CT scan of the lungs and chest.  · Blood  tests.  · Taking a tissue sample (biopsy) from your lung to look for cancer cells.  Your cancer will be staged to determine its severity and extent. Staging is a careful attempt to find out the size of the tumor, whether the cancer has spread, and if so, to what parts of the body. You may need to have more tests to determine the stage of your cancer. The test results will help determine what treatment plan is best for you.   · Stage 0--This is the earliest stage of lung cancer. In this stage the tumor is present in only a few layers of cells and has not grown beyond the inner lining of the lungs. Stage 0 (carcinoma in situ) is considered noninvasive, meaning at this stage it is not yet capable of spreading to other regions.  · Stage I-- The cancer is located only in the lungs and not spread to any lymph nodes.  · Stage II--The cancer is in the lungs and the nearby lymph nodes.  · Stage III--The cancer is in the lungs and the lymph nodes in the middle of the chest. This is also called locally advanced disease. This stage has two subtypes:  ¨ Stage IIIa - The cancer has spread only to lymph nodes on the same side of the chest where the cancer started.  ¨ Stage IIIb - The cancer   has spread to lymph nodes on the opposite side of the chest or above the collar bone.  · Stage IV-- This is the most advanced stage of lung cancer and is also called advanced disease. This stage describes when the cancer has spread to both lungs, the fluid in the area around the lungs, or to another body part.  Your health care provider may tell you the detailed stage of your cancer, which includes both a number and a letter.   TREATMENT   Depending on the type and stage, lung cancer may be treated with surgery, radiation therapy, chemotherapy, or targeted therapy. Some people have a combination of these therapies. Your treatment plan will be developed by your health care team.   HOME CARE INSTRUCTIONS   · Do not smoke.  · Only take  over-the-counter or prescription medicines for pain, discomfort, or fever as directed by your health care provider.  · Maintain a healthy diet.  · Consider joining a support group. This may help you learn to cope with the stress of having lung cancer.  · Seek advice to help you manage treatment side effects.  · Keep all follow-up appointments as directed by your health care provider.  · Inform your cancer specialist if you are admitted to the hospital.  SEEK MEDICAL CARE IF:   · You are losing weight without trying.  · You have a persistent cough.  · You feel short of breath.  · You tire easily.  SEEK IMMEDIATE MEDICAL CARE IF:   · You cough up clotted blood or bright red blood.  · Your pain is not manageable or controlled by medicine.  · You develop new difficulty breathing or chest pain.  · You develop swelling in one or both ankles or legs, or swelling in your face or neck.  · You develop headache or confusion.  Document Released: 07/16/2000 Document Revised: 01/28/2013 Document Reviewed: 08/13/2013  [employer]® Patient Information ©2015 [employer]. This information is not intended to replace advice given to you by your health care provider. Make sure you discuss any questions you have with your health care provider.

## 2015-01-19 NOTE — Progress Notes (Signed)
[address]       [place] [address]             [phone]                    [name] [facility] Record #[id number] Date of Birth: [birth year]  Referring: [name], MD Primary Care: [name], MD  Chief Complaint:   Lung Mass  History of Present Illness:    [name] 54 y.o. male is seen in the office  today for newly discovered right lung mass. The patient was previously seen in September 2013 at which time he underwent urgent coronary artery bypass grafting. He now returns with a newly discovered right lung mass. He noted pain in his right ribs several days ago and went to the [facility] emergency room chest x-ray showed a right hilar mass this was confirmed by CT of the chest. The patient is referred for further evaluation for radiographic evidence of primary lung cancer. The patient has been a long-term smoker and previously used alcohol heavily. He stopped smoking and consuming alcohol found his coronary artery bypass grafting. He continues to avoid alcohol, but after his wife was involved in a motor vehicle accident he resumed smoking.   He denies any recent weight loss or gain, has had cough but no hemoptysis. He specifically denies any abdominal discomfort. He denies any notable facial or neck swelling suggestive of superior vena cava syndrome.     Current Activity/ Functional Status:  Patient is independent with mobility/ambulation, transfers, ADL's, IADL's.   Zubrod Score: At the time of surgery this patient's most appropriate activity status/level should be described as: '[]'$     0    Normal activity, no symptoms '[]'$     1    Restricted in physical strenuous activity but ambulatory, able to do out light work '[]'$     2    Ambulatory and capable of self care, unable to do work activities, up and about               >50 % of waking hours                              '[]'$     3    Only limited self care, in bed greater than 50% of  waking hours '[]'$     4    Completely disabled, no self care, confined to bed or chair '[]'$     5    Moribund   Past Medical History  Diagnosis Date  . Coronary artery disease 2002    Lesion in LAD s/p angioplasty; s/p CABG x 4 Sept 2013  . Tobacco abuse   . COPD (chronic obstructive pulmonary disease)   . Myocardial infarct 1914,7829    stents  . Hyperlipidemia 1999  . Essential hypertension, benign 1999    Past Surgical History  Procedure Laterality Date  . Knee surgery      left x 2  ACL repair  . Tee without cardioversion  01/02/2012    Procedure: TRANSESOPHAGEAL ECHOCARDIOGRAM (TEE);  Surgeon: [name], MD;  Location: [facility];  Service: Open Heart Surgery;  Laterality: N/A;  . Multiple extractions with alveoloplasty  02/21/2012    Procedure: MULTIPLE EXTRACION WITH ALVEOLOPLASTY;  Surgeon: Lenn Cal, DDS;  Location: [facility];  Service: Oral Surgery;  Laterality: N/A;  Extaction of tooth #'s 4,5,6,7,10,11,12,13,14,15,19,20,21,22,23,24,25,   .  Left heart catheterization with coronary angiogram N/A 01/01/2012    Procedure: LEFT HEART CATHETERIZATION WITH CORONARY ANGIOGRAM;  Surgeon: Hillary Bow, MD;  Location: [facility] CATH LAB;  Service: Cardiovascular;  Laterality: N/A;  . Coronary artery bypass graft  01/02/2012    Procedure: CORONARY ARTERY BYPASS GRAFTING (CABG);  Surgeon: [name], MD;  Location: [facility];  Service: Open Heart Surgery;  Laterality: N/A;  times three using left internal mammary artery and right endoscopically harvested saphenous vein    Family History  Problem Relation Age of Onset  . Cancer Neg Hx   . Heart disease Father   . Diabetes Father   . Hyperlipidemia Father   . Hypertension Father   . Hyperlipidemia Mother   . Hypertension Mother   . Stroke Mother   . Diabetes Paternal Grandmother     Social History   Social History  . Marital Status: Married    Spouse Name: N/A  . Number of Children: 5  . Years of Education: N/A    Occupational History  . Works in [place]  . Smoking status: Current Every Day Smoker -- 2.00 packs/day for 35 years    Types: Cigarettes    Start date: 06/30/1976  . Smokeless tobacco: Never Used  . Alcohol Use: Yes     Comment: maybe a 12 pack/mth if that  . Drug Use: No  . Sexual Activity: Yes   Other Topics Concern  . Not on file   Social History Narrative    History  Smoking status  . Current Every Day Smoker -- 2.00 packs/day for 35 years  . Types: Cigarettes  . Start date: 06/30/1976  Smokeless tobacco  . Never Used    History  Alcohol Use  . Yes    Comment: maybe a 12 pack/mth if that     Allergies  Allergen Reactions  . Lisinopril Cough  . Tea Hives    Current Outpatient Prescriptions  Medication Sig Dispense Refill  . amLODipine (NORVASC) 5 MG tablet TAKE ONE (1) TABLET BY MOUTH EVERY DAY 90 tablet 6  . aspirin EC 81 MG tablet Take 81 mg by mouth daily.    . carvedilol (COREG) 6.25 MG tablet TAKE ONE TABLET BY MOUTH TWICE A DAY WITH A MEAL 180 tablet 3  . HYDROcodone-acetaminophen (NORCO) 5-325 MG tablet Take 1-2 tablets by mouth every 4 (four) hours as needed. 15 tablet 0  . ibuprofen (ADVIL,MOTRIN) 200 MG tablet Take 600 mg by mouth every 6 (six) hours as needed.    . pravastatin (PRAVACHOL) 40 MG tablet TAKE ONE (1) TABLET BY MOUTH EVERY DAY 90 tablet 6   No current facility-administered medications for this visit.      Review of Systems:     Cardiac Review of Systems: Y or N  Chest Pain [ y   ]  Resting SOB [ n  ] Exertional SOB  [ y ]  Vertell Limber Florencio.Farrier  ]   Pedal Edema [ n  ]    Palpitations [n  ] Syncope  [n  ]   Presyncope [n   ]  General Review of Systems: [Y] = yes [  ]=no Constitional: recent weight change [  ];  Wt loss over the last 3 months [   ] anorexia [  ]; fatigue [  ]; nausea [  ]; night sweats [  ]; fever [  ]; or chills [  ];  Dental: poor dentition[  ]; Last Dentist visit:   Eye : blurred  vision [  ]; diplopia [   ]; vision changes [  ];  Amaurosis fugax[  ]; Resp: cough [ y ];  wheezing[ yy ];  hemoptysis[ n ]; shortness of breath[ n ]; paroxysmal nocturnal dyspnea[y  ]; dyspnea on exertion[  ]; or orthopnea[  ];  GI:  gallstones[  ], vomiting[  ];  dysphagia[  ]; melena[  ];  hematochezia [  ]; heartburn[  ];   Hx of  Colonoscopy[ n ]; GU: kidney stones [  ]; hematuria[  ];   dysuria [  ];  nocturia[  ];  history of     obstruction [  ]; urinary frequency [  ]             Skin: rash, swelling[  ];, hair loss[  ];  peripheral edema[  ];  or itching[  ]; Musculosketetal: myalgias[  ];  joint swelling[  ];  joint erythema[  ];  joint pain[  ];  back pain[  ];  Heme/Lymph: bruising[  ];  bleeding[  ];  anemia[  ];  Neuro: TIA[  ];  headaches[n  ];  stroke[  ];  vertigo[n  ];  seizures[n  ];   paresthesias[  ];  difficulty walking[n  ];  Psych:depression[  ]; anxiety[  ];  Endocrine: diabetes[  ];  thyroid dysfunction[  ];  Immunizations: Flu up to date [ n ]; Pneumococcal up to date Florencio.Farrier  ];  Other:  Physical Exam: BP 135/79 mmHg  Pulse 88  Resp 20  Ht '5\' 10"'$  (1.778 m)  Wt 225 lb (102.059 kg)  BMI 32.28 kg/m2  SpO2 97%  PHYSICAL EXAMINATION: General appearance: alert, cooperative, appears older than stated age and no distress Head: Normocephalic, without obvious abnormality, atraumatic, right External juglar vein notable but no facial swelling Neck: no adenopathy, no carotid bruit,  Mild JVD right neck, supple, symmetrical, trachea midline and thyroid not enlarged, symmetric, no tenderness/mass/nodules Lymph nodes: Cervical, supraclavicular, and axillary nodes normal. Resp: diminished breath sounds bibasilar and rhonchi bibasilar Back: symmetric, no curvature. ROM normal. No CVA tenderness. Cardio: regular rate and rhythm, S1, S2 normal, no murmur, click, rub or gallop GI: soft, non-tender; bowel sounds normal; no masses,  no organomegaly Extremities: extremities normal,  atraumatic, no cyanosis or edema and Homans sign is negative, no sign of DVT Neurologic: Grossly normal  Diagnostic Studies & Laboratory data:     Recent Radiology Findings:   Dg Chest 2 View  01/18/2015   CLINICAL DATA:  Cough.  RIGHT flank pain.  EXAM: CHEST  2 VIEW  COMPARISON:  01/31/2012.  FINDINGS: RIGHT hilar mass is present. There is probably a component of postobstructive pneumonia. The RIGHT hilar mass measures 6.8 cm x 7.6 cm. This is a new finding compared to 01/31/2012. Median sternotomy/ CABG. Small RIGHT pleural effusion, potentially malignant or parapneumonic effusion. Cardiopericardial silhouette within normal limits for size.  IMPRESSION: 6.8 cm x 7.6 cm RIGHT hilar mass most compatible with bronchogenic carcinoma. Followup chest CT with contrast recommended for further assessment.   Electronically Signed   By: Dereck Ligas M.D.   On: 01/18/2015 10:36   Ct Chest W Contrast  01/18/2015   CLINICAL DATA:  New diagnosis of lung cancer on chest radiograph earlier today. Further evaluation recommended. RIGHT hilar mass.  EXAM: CT CHEST WITH CONTRAST  TECHNIQUE: Multidetector CT imaging of the chest was performed during intravenous  contrast administration.  CONTRAST:  53m OMNIPAQUE IOHEXOL 300 MG/ML  SOLN  COMPARISON:  01/18/2015 chest radiograph.  FINDINGS: Musculoskeletal: Thoracic vertebral body height is preserved. Median sternotomy/ CABG. Central canal grossly appears patent by CT. No destructive rib lesions or transthoracic spread of tumor.  Lungs: Centrilobular and paraseptal emphysema is present. Very large RIGHT hilar mass extends cranial to the RIGHT pulmonary artery and pulmonary hilum. The mass measures 8.7 cm transverse by 8.7 cm AP. The mass is centered in the RIGHT hilum with mediastinal invasion.  There is atelectasis and airspace disease distal to the mass suspicious for postobstructive pneumonia in the RIGHT upper lobe.  Central airways: The RIGHT upper lobe bronchus is  completely occluded. Bronchus cut off is present on the coronal images (image 64 series 4). Trachea and LEFT central airways patent. The RIGHT middle and RIGHT lower lobe bronchi remain patent.  Vasculature: Mass encases the RIGHT pulmonary artery and almost completely obliterates the lumen. No acute arterial abnormality. CABG. Descending aortic atherosclerosis with mural plaque and calcification.  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 The azygos vein is engorged, probably representing a source of collateral flow around the obstructed superior vena cava.  Effusions: Small RIGHT dependently layering pleural effusion which may be reactive to airspace disease or malignant. No LEFT pleural effusion is evident. No pericardial effusion.  Lymphadenopathy: No axillary adenopathy. No LEFT hilar adenopathy. Precarinal and LEFT paratracheal lymph nodes are enlarged. Enlarged node measuring 14 mm x 19 mm just to the RIGHT of the AP window.  Esophagus: Grossly normal.  Upper abdomen: Both adrenal glands are within normal limits. No hepatic metastases are visible. There is a mass in the body of the pancreas with mass effect on the adjacent splenic vein. This mass measures 3.4 cm x 2.6 cm.  Other: None.  IMPRESSION: 1. 8.7 x 8.7 cm RIGHT hilar mass encasing the RIGHT pulmonary artery and occluding the RIGHT upper lobe bronchus. Primary bronchogenic carcinoma favored over metastatic disease in this patient with emphysema. 2. Mild airspace disease in the peripheral RIGHT upper lobe, probably postobstructive pneumonia. 3. Small RIGHT pleural effusion which may be malignant or reactive. 4. 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 Correlate for SVC syndrome findings. 5. 34 mm x 26 mm mass in the body of the pancreas, partially visible. Metastatic disease versus pancreatic adenocarcinoma are the primary considerations.   Electronically Signed   By: GDereck Ligas M.D.   On: 01/18/2015 12:54   I have independently reviewed the above radiology studies  and reviewed the findings with the patient.    I have independently reviewed the above radiologic studies.  Recent Lab Findings: Lab Results  Component Value Date   WBC 8.9 01/18/2015   HGB 13.7 01/18/2015   HCT 40.3 01/18/2015   PLT 196 01/18/2015   GLUCOSE 134* 01/18/2015   CHOL 201* 01/01/2012   TRIG 644* 01/01/2012   HDL 22* 01/01/2012   LDLDIRECT 124.8 06/28/2010   LDLCALC UNABLE TO CALCULATE IF TRIGLYCERIDE OVER 400 mg/dL 01/01/2012   ALT 21 12/31/2011   AST 19 12/31/2011   NA 134* 01/18/2015   K 4.1 01/18/2015   CL 99* 01/18/2015   CREATININE 0.99 01/18/2015   BUN 8 01/18/2015   CO2 27 01/18/2015   TSH 2.35 04/14/2010   INR 1.12 01/02/2012   HGBA1C 5.3 01/02/2012      Assessment / Plan:   Advanced stage Lung cancer, 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 Pancreatic Mass of unknown etiology at this point-    34 mm x 26 mm mass in the body of the pancreas has previous distant history of pancreatitis  History of CAD, s/p CABG 01/02/2012  I reviewed with the patient the probable diagnosis of at least stage IIIB carcinoma the lung. I recommended to him we proceed with obtaining a PET scan and MRI of the brain to fully stage his lung cancer. With the central location of the mass I recommended to him that we proceed with bronchoscopy ebus and biopsy of the right hilar mass to obtain a tissue diagnosis. We'll tentatively plan this for Monday, October 3. After the PET scan was completed plans to evaluate the abdominal mask and then be done. Patient has an appointment to see Dr. Whitney Muse September 30.  PET scheduled for Oct  5   I  spent 40 minutes counseling the patient face to face and 50% or more the  time was spent in counseling and coordination of care. The total time spent in the appointment was 60  minutes.  [name] MD      [address] [place] [address] Office [phone]   Beeper [phone]  01/19/2015 5:15 PM

## 2015-01-20 ENCOUNTER — Encounter: Payer: Self-pay | Admitting: *Deleted

## 2015-01-20 ENCOUNTER — Ambulatory Visit: Payer: 59 | Admitting: Family Medicine

## 2015-01-20 ENCOUNTER — Other Ambulatory Visit: Payer: Self-pay | Admitting: *Deleted

## 2015-01-20 DIAGNOSIS — R918 Other nonspecific abnormal finding of lung field: Secondary | ICD-10-CM

## 2015-01-20 DIAGNOSIS — C3411 Malignant neoplasm of upper lobe, right bronchus or lung: Secondary | ICD-10-CM

## 2015-01-21 ENCOUNTER — Telehealth: Payer: Self-pay | Admitting: *Deleted

## 2015-01-21 ENCOUNTER — Encounter (HOSPITAL_COMMUNITY): Payer: Self-pay | Admitting: Hematology & Oncology

## 2015-01-21 ENCOUNTER — Encounter (HOSPITAL_COMMUNITY): Payer: 59 | Attending: Hematology & Oncology | Admitting: Hematology & Oncology

## 2015-01-21 ENCOUNTER — Ambulatory Visit (HOSPITAL_COMMUNITY)
Admission: RE | Admit: 2015-01-21 | Discharge: 2015-01-21 | Disposition: A | Discharge status: Home / Self Care | Payer: 59 | Source: Ambulatory Visit | Attending: Cardiothoracic Surgery | Admitting: Cardiothoracic Surgery

## 2015-01-21 VITALS — BP 144/73 | HR 87 | Temp 98.4°F | Resp 18 | Ht 69.5 in | Wt 219.1 lb

## 2015-01-21 DIAGNOSIS — C3411 Malignant neoplasm of upper lobe, right bronchus or lung: Secondary | ICD-10-CM

## 2015-01-21 DIAGNOSIS — R9082 White matter disease, unspecified: Secondary | ICD-10-CM | POA: Diagnosis not present

## 2015-01-21 DIAGNOSIS — R079 Chest pain, unspecified: Secondary | ICD-10-CM | POA: Diagnosis not present

## 2015-01-21 DIAGNOSIS — R05 Cough: Secondary | ICD-10-CM | POA: Diagnosis not present

## 2015-01-21 DIAGNOSIS — M2550 Pain in unspecified joint: Secondary | ICD-10-CM | POA: Diagnosis not present

## 2015-01-21 DIAGNOSIS — Z720 Tobacco use: Secondary | ICD-10-CM

## 2015-01-21 DIAGNOSIS — G893 Neoplasm related pain (acute) (chronic): Secondary | ICD-10-CM

## 2015-01-21 DIAGNOSIS — C3401 Malignant neoplasm of right main bronchus: Secondary | ICD-10-CM

## 2015-01-21 MED ORDER — HYDROCODONE-ACETAMINOPHEN 10-325 MG PO TABS: 1.0000 | ORAL_TABLET | Freq: Four times a day (QID) | ORAL | Status: DC | PRN

## 2015-01-21 NOTE — Patient Instructions (Signed)
[facility] Discharge Instructions  RECOMMENDATIONS MADE BY THE CONSULTANT AND ANY TEST RESULTS WILL BE SENT TO YOUR REFERRING PHYSICIAN.  Exam and discussion by Dr. Whitney Muse. Hydrocodone - take as directed Will see you in follow-up upon completion of tests and procedures.   Thank you for choosing [facility] to provide your oncology and hematology care.  To afford each patient quality time with our provider, please arrive at least 15 minutes before your scheduled appointment time.    You need to re-schedule your appointment should you arrive 10 or more minutes late.  We strive to give you quality time with our providers, and arriving late affects you and other patients whose appointments are after yours.  Also, if you no show three or more times for appointments you may be dismissed from the clinic at the providers discretion.     Again, thank you for choosing [facility].  Our hope is that these requests will decrease the amount of time that you wait before being seen by our physicians.       _____________________________________________________________  Should you have questions after your visit to [facility], please contact our office at [phone] between the hours of 8:30 a.m. and 4:30 p.m.  Voicemails left after 4:30 p.m. will not be returned until the following business day.  For prescription refill requests, have your pharmacy contact our office.

## 2015-01-21 NOTE — Telephone Encounter (Signed)
Submitted referral thru [facility] compass for referral to Dr. 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

## 2015-01-21 NOTE — Progress Notes (Signed)
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 male is here on referral from the ED for lung cancer. He is here today with his wife, [name] He recently presented to the ED with pain in his right upper chest, which felt like a "pulled muscle." He indicates the area under his right arm, along the side of his torso. 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 In the ED, the doctor ordered an X-ray on his chest, which revealed the mass in his right lung.  His wife says that, after the X-ray in the ED, no one had spoken a word to them. Then she noticed that the nurse came into the room with "a bunch of vials of blood." Since she used to be a ceCytogeneticistMr. [name]'s wife says she knew at that point something wasn't right. [name] "just like that, you go in for a muscle pull, and come back with everything but the muscle pull." His wife says "they were almost out in the parking lot boxing" over his diagnosis because they were both so  upset. It is evident that [name] very shocked by his diagnosis. His wife is more balanced about it, expressing a general familiarity with healthcare, and the fact that she went through a cancer experience with her father back in 2006.  With regards to [name] cancer, his wife reports that Dr. GeServando Snareaid that surgery is not an option because of the way the mass is wrapped around the pulmonary artery.  Mr. PeHaileoticed over the past month or so that his breathing was "off." He adds that his weight has been fine. The only time that any significant weight loss occurred was after his heart surgery, at which point he dropped from 230-45 to 219-30. He's remained stable, fluctuating at this lower weight range ever since. He adds that his appetite is fine. Other than the breathing changes and "pulled muscle" pain on his right side, he denies any new or worsening pain. No fever, rare cough.   He does mention that his coughing has been worse for about the past 6 months. If he lays flat, it keeps him up at night; but if he props himself up, he can sleep fine. Taking a hydrocodone also assists him with sleep.  They have told their three oldest children about [name]'s cancer, but have not told their two younger children, aged 1419nd 1623 He has seen Dr. GeServando Snaren consultation and has a bronchoscopy ebus and biopsy of the R hilar mass next week. PET/CT is ordered.  MEDICAL HISTORY:  Past Medical History  Diagnosis Date  . Coronary artery disease 2002    Lesion in LAD s/p angioplasty; s/p CABG x 4 Sept 2013  . Tobacco abuse   . COPD (chronic obstructive pulmonary disease)   . Myocardial infarct 1017,5102    stents  . Hyperlipidemia 1999  . Essential hypertension, benign 1999  . Lung mass     SURGICAL HISTORY: Past Surgical History  Procedure Laterality Date  . Knee surgery      left x 2  ACL repair  . Tee without cardioversion  01/02/2012    Procedure: TRANSESOPHAGEAL  ECHOCARDIOGRAM (TEE);  Surgeon: [name], MD;  Location: [facility];  Service: Open Heart Surgery;  Laterality: N/A;  . Multiple extractions with alveoloplasty  02/21/2012    Procedure: MULTIPLE EXTRACION WITH ALVEOLOPLASTY;  Surgeon: Lenn Cal, DDS;  Location: [facility];  Service: Oral Surgery;  Laterality: N/A;  Extaction of tooth #'s 4,5,6,7,10,11,12,13,14,15,19,20,21,22,23,24,25,   . Left heart catheterization with coronary angiogram N/A 01/01/2012    Procedure: LEFT HEART CATHETERIZATION WITH CORONARY ANGIOGRAM;  Surgeon: Hillary Bow, MD;  Location[facility];  Service: Cardiovascular;  Laterality: N/A;  . Coronary artery bypass graft  01/02/2012    Procedure: CORONARY ARTERY BYPASS GRAFTING (CABG);  Surgeon: [name], MD;  Location: [facility];  Service: Open Heart Surgery;  Laterality: N/A;  times three using left internal mammary artery and right endoscopically harvested saphenous vein    SOCIAL HISTORY: Social History   Social History  . Marital Status: Married    Spouse Name: N/A  . Number of Children: 5  . Years of Education: N/A   Occupational History  . Works in [employer]  . Smoking status: Current Every Day Smoker -- 2.00 packs/day for 35 years    Types: Cigarettes    Start date: 06/30/1976  . Smokeless tobacco: Never Used  . Alcohol Use: Yes     Comment: maybe a 12 pack/mth if that  . Drug Use: No  . Sexual Activity: Yes   Other Topics Concern  . Not on file   Social History Narrative   [name] been married 17 years, with five kids.  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 Cancer Neg Hx   . Heart disease Father   . Diabetes Father   . Hyperlipidemia Father   . Hypertension Father   . Hyperlipidemia Mother   . Hypertension Mother   . Stroke Mother   . Diabetes Paternal Grandmother    indicated that his mother is alive. He indicated that his father is alive. He indicated that his sister is alive. He indicated that his brother is alive. He indicated that his maternal grandmother is deceased. He indicated that his maternal grandfather is deceased. He indicated that his paternal grandmother is deceased. He indicated that his paternal grandfather is deceased.   His father's somewhere in [place]; his mother lives here in [place] and works at [employer]  She's 75 at the end of this month She's got high blood pressure, high cholesterol, no strokes that they know of; is on blood thinners Has an aneurism at the base of the aorta where it goes into your stomach He knows his father's had high blood pressure and diabetes; Parents have been divorced since he was 3-4 Has half brothers / half sisters   ALLERGIES:  is allergic to lisinopril and tea.  MEDICATIONS:  Current Outpatient Prescriptions  Medication Sig Dispense Refill  . amLODipine (NORVASC) 5 MG tablet TAKE ONE (1) TABLET BY MOUTH EVERY DAY 90 tablet 6  . aspirin EC 81 MG tablet Take 81 mg by mouth daily.    . carvedilol (COREG) 6.25 MG tablet TAKE ONE TABLET BY MOUTH TWICE A DAY WITH A MEAL 180 tablet 3  . pravastatin (PRAVACHOL) 40 MG tablet TAKE ONE (1) TABLET BY MOUTH EVERY DAY 90 tablet 6  . HYDROcodone-acetaminophen (NORCO) 10-325 MG tablet Take 1 tablet by mouth every 6 (six) hours as needed. 90 tablet 0  . ibuprofen (ADVIL,MOTRIN) 200 MG tablet Take 600 mg by mouth every 6 (six) hours as needed.     No current facility-administered medications for this visit.    Review of Systems  Constitutional: Negative for fever, chills, weight  loss and malaise/fatigue.  HENT: Negative for congestion, hearing loss, nosebleeds, sore throat and tinnitus.   Eyes: Negative for blurred vision, double vision, pain and discharge.  Respiratory: Positive for cough. Negative for hemoptysis, sputum production, shortness of breath and wheezing.   Cardiovascular: Positive for chest pain. Negative for palpitations, claudication, leg swelling and PND.       Note, non-cardiac chest pain  Gastrointestinal: Negative for heartburn, nausea, vomiting, abdominal pain, diarrhea, constipation, blood in stool and melena.  Genitourinary: Negative for dysuria, urgency, frequency and hematuria.  Musculoskeletal: Positive for joint pain. Negative for myalgias and falls.       Has had knee surgeries.  Skin: Negative for itching and rash.  Neurological: Negative for dizziness, tingling, tremors, sensory change, speech change, focal weakness, seizures, loss of consciousness, weakness and headaches.  Endo/Heme/Allergies: Does not bruise/bleed easily.  Psychiatric/Behavioral: Negative for depression, suicidal ideas, memory loss and substance abuse. The patient is not nervous/anxious and does not have insomnia.   All other systems reviewed and are negative.  14 point ROS was done and is otherwise as detailed above or in HPI    PHYSICAL EXAMINATION: ECOG PERFORMANCE STATUS: 1 - Symptomatic but completely ambulatory  Filed Vitals:   01/21/15 1500  BP: 144/73  Pulse: 87  Temp: 98.4 F (36.9 C)  Resp: 18   Filed Weights   01/21/15 1500  Weight: 219 lb 1.6 oz (99.383 kg)     Physical Exam  Constitutional: He is oriented to person, place, and time and well-developed, well-nourished, and in no distress.  HENT:  Head: Normocephalic and atraumatic.  Nose: Nose normal.  Mouth/Throat: Oropharynx is clear and moist. No oropharyngeal exudate.  Dentures on top and bottom.  Eyes: Conjunctivae and EOM are normal. Pupils are equal, round, and reactive to light.  Right eye exhibits no discharge. Left eye exhibits no discharge. No scleral icterus.  Neck: Normal range of motion. Neck supple. No tracheal deviation present. No thyromegaly present.  Cardiovascular: Normal rate, regular rhythm and normal heart sounds.  Exam reveals no gallop and no friction rub.   No murmur heard. Pulmonary/Chest: Effort normal and breath sounds normal. He has no wheezes. He has no rales.  Abdominal:  Soft. Bowel sounds are normal. He exhibits no distension and no mass. There is no tenderness. There is no rebound and no guarding.  Musculoskeletal: Normal range of motion. He exhibits no edema.  Lymphadenopathy:    He has no cervical adenopathy.  Neurological: He is alert and oriented to person, place, and time. He has normal reflexes. No cranial nerve deficit. Gait normal. Coordination normal.  Skin: Skin is warm and dry. No rash noted.  Psychiatric: Mood, memory, affect and judgment normal.  Nursing note and vitals reviewed.   LABORATORY DATA:  I have reviewed the data as listed  Results for SEAN, MALINOWSKI (MRN [id number])   Ref. Range 01/18/2015 11:20  Sodium Latest Ref Range: 135-145 mmol/L 134 (L)  Potassium Latest Ref Range: 3.5-5.1 mmol/L 4.1  Chloride Latest Ref Range: 101-111 mmol/L 99 (L)  CO2 Latest Ref Range: 22-32 mmol/L 27  BUN Latest Ref Range: 6-20 mg/dL 8  Creatinine Latest Ref Range: 0.61-1.24 mg/dL 0.99  Calcium Latest Ref Range: 8.9-10.3 mg/dL 8.3 (L)  EGFR (Non-African Amer.) Latest Ref Range: >60 mL/min >60  EGFR (African American) Latest Ref Range: >60 mL/min >60  Glucose Latest Ref Range: 65-99 mg/dL 134 (H)  Anion gap Latest Ref Range: 5-15  8  WBC Latest Ref Range: 4.0-10.5 K/uL 8.9  RBC Latest Ref Range: 4.22-5.81 MIL/uL 4.51  Hemoglobin Latest Ref Range: 13.0-17.0 g/dL 13.7  HCT Latest Ref Range: 39.0-52.0 % 40.3  MCV Latest Ref Range: 78.0-100.0 fL 89.4  MCH Latest Ref Range: 26.0-34.0 pg 30.4  MCHC Latest Ref Range: 30.0-36.0 g/dL 34.0    RDW Latest Ref Range: 11.5-15.5 % 13.7  Platelets Latest Ref Range: 150-400 K/uL 196  Neutrophils Latest Units: % 68  Lymphocytes Latest Units: % 22  Monocytes Relative Latest Units: % 7  Eosinophil Latest Units: % 3  Basophil Latest Units: % 0  NEUT# Latest Ref Range: 1.7-7.7 K/uL 6.0  Lymphocyte # Latest Ref Range: 0.7-4.0 K/uL 2.0  Monocyte # Latest Ref Range: 0.1-1.0 K/uL 0.6  Eosinophils Absolute Latest Ref Range: 0.0-0.7 K/uL 0.3  Basophils Absolute Latest Ref Range: 0.0-0.1 K/uL 0.0    RADIOGRAPHIC STUDIES: I have personally reviewed the radiological images as listed and agreed with the findings in the report.  Dg Chest 2 View  01/18/2015   CLINICAL DATA:  Cough.  RIGHT flank pain.  EXAM: CHEST  2 VIEW  COMPARISON:  01/31/2012.  FINDINGS: RIGHT hilar mass is present. There is probably a component of postobstructive pneumonia. The RIGHT hilar mass measures 6.8 cm x 7.6 cm. This is a new finding compared to 01/31/2012. Median sternotomy/ CABG. Small RIGHT pleural effusion, potentially malignant or parapneumonic effusion. Cardiopericardial silhouette within normal limits for size.  IMPRESSION: 6.8 cm x 7.6 cm RIGHT hilar mass most compatible with bronchogenic carcinoma. Followup chest CT with contrast recommended for further assessment.   Electronically Signed   By: Dereck Ligas M.D.   On: 01/18/2015 10:36   Ct Chest W Contrast  01/18/2015   CLINICAL DATA:  New diagnosis of lung cancer on chest radiograph earlier today. Further evaluation recommended. RIGHT hilar mass.  EXAM: CT CHEST WITH CONTRAST  TECHNIQUE: Multidetector CT imaging of the chest was performed during intravenous contrast administration.  CONTRAST:  37m OMNIPAQUE IOHEXOL 300 MG/ML  SOLN  COMPARISON:  01/18/2015 chest radiograph.  FINDINGS: Musculoskeletal: Thoracic vertebral body height is preserved. Median sternotomy/ CABG. Central canal grossly appears patent by CT. No destructive rib lesions or transthoracic spread  of tumor.  Lungs: Centrilobular  and paraseptal emphysema is present. Very large RIGHT hilar mass extends cranial to the RIGHT pulmonary artery and pulmonary hilum. The mass measures 8.7 cm transverse by 8.7 cm AP. The mass is centered in the RIGHT hilum with mediastinal invasion.  There is atelectasis and airspace disease distal to the mass suspicious for postobstructive pneumonia in the RIGHT upper lobe.  Central airways: The RIGHT upper lobe bronchus is completely occluded. Bronchus cut off is present on the coronal images (image 64 series 4). Trachea and LEFT central airways patent. The RIGHT middle and RIGHT lower lobe bronchi remain patent.  Vasculature: Mass encases the RIGHT pulmonary artery and almost completely obliterates the lumen. No acute arterial abnormality. CABG. Descending aortic atherosclerosis with mural plaque and calcification.  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 The azygos vein is engorged, probably representing a source of collateral flow around the obstructed superior vena cava.  Effusions: Small RIGHT dependently layering pleural effusion which may be reactive to airspace disease or malignant. No LEFT pleural effusion is evident. No pericardial effusion.  Lymphadenopathy: No axillary adenopathy. No LEFT hilar adenopathy. Precarinal and LEFT paratracheal lymph nodes are enlarged. Enlarged node measuring 14 mm x 19 mm just to the RIGHT of the AP window.  Esophagus: Grossly normal.  Upper abdomen: Both adrenal glands are within normal limits. No hepatic metastases are visible. There is a mass in the body of the pancreas with mass effect on the adjacent splenic vein. This mass measures 3.4 cm x 2.6 cm.  Other: None.  IMPRESSION: 1. 8.7 x 8.7 cm RIGHT hilar mass encasing the RIGHT pulmonary artery and occluding the RIGHT upper lobe bronchus. Primary bronchogenic carcinoma favored over metastatic disease in this patient with emphysema. 2. Mild airspace disease in  the peripheral RIGHT upper lobe, probably postobstructive pneumonia. 3. Small RIGHT pleural effusion which may be malignant or reactive. 4. 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 Correlate for SVC syndrome findings. 5. 34 mm x 26 mm mass in the body of the pancreas, partially visible. Metastatic disease versus pancreatic adenocarcinoma are the primary considerations.   Electronically Signed   By: Dereck Ligas M.D.   On: 01/18/2015 12:54   Mr Jeri Cos FV Contrast  01/21/2015   CLINICAL DATA:  Lung cancer staging. No neurologic symptoms are reported. Initial encounter.  EXAM: MRI HEAD WITHOUT AND WITH CONTRAST  TECHNIQUE: Multiplanar, multiecho pulse sequences of the brain and surrounding structures were obtained without and with intravenous contrast.  CONTRAST:  MultiHance 20 mL.  COMPARISON:  06/29/2010.  FINDINGS: No evidence for acute infarction, hemorrhage, mass lesion, hydrocephalus, or extra-axial fluid. Go normal for age cerebral volume. Minor white matter disease, nonspecific and stable.  Pituitary, pineal, and cerebellar tonsils unremarkable. No upper cervical lesions.  Flow voids are maintained throughout the carotid, basilar, and vertebral arteries. There are no areas of chronic hemorrhage.  Post infusion, no abnormal enhancement of the brain or meninges. Dominant LEFT venous system. No signs of venous thrombosis.  Visualized calvarium, skull base, and upper cervical osseous structures unremarkable. Scalp and extracranial soft tissues, orbits, sinuses, and mastoids show no acute process.  IMPRESSION: No acute intracranial findings. No signs of metastatic disease. Minor white matter disease, stable and nonspecific.   Electronically Signed   By: Staci Righter M.D.   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  We discussed staging of his disease. We reviewed his imaging, including his head MRI.  We discussed the findings in the pancreas, I advised him that PET imaging will be helpful for further evaluation of this finding as well. The patient and his wife had multiple questions about his upcoming bronchoscopy, timing of pathology results and potential treatment options.   I emphasized the importance of tobacco cessation.   He was given 15 tablets of norco in the ED. I have provided him with a refill. He currently takes 3 per day.  We will plan on re-grouping after his PET/CT and approximately 4 to 5 days after his bronchoscopy and biopsy.   All questions were answered. The patient knows to call the clinic with any problems, questions or concerns.  This document serves as a record of services personally performed by ShAncil LinseyMD. It was created on her behalf by KaToni Amenda trained medical scribe. The creation of this record is based on the scribe's personal observations and the Marshia Tropea's statements to them. This document has been checked and approved by the attending Kassaundra Hair.  I have reviewed the above documentation for accuracy and completeness, and I agree with the above.  This note was electronically signed.    PeMolli HazardMD  01/21/2015 4:27 PM

## 2015-01-24 ENCOUNTER — Encounter (HOSPITAL_COMMUNITY): Payer: Self-pay

## 2015-01-24 ENCOUNTER — Encounter (HOSPITAL_COMMUNITY)
Admission: RE | Admit: 2015-01-24 | Discharge: 2015-01-24 | Disposition: A | Discharge status: Home / Self Care | Payer: 59 | Source: Ambulatory Visit | Attending: Cardiothoracic Surgery | Admitting: Cardiothoracic Surgery

## 2015-01-24 ENCOUNTER — Other Ambulatory Visit (HOSPITAL_COMMUNITY): Payer: Self-pay | Admitting: *Deleted

## 2015-01-24 VITALS — BP 123/79 | HR 83 | Temp 98.4°F | Resp 18 | Ht 69.5 in | Wt 221.5 lb

## 2015-01-24 DIAGNOSIS — Z951 Presence of aortocoronary bypass graft: Secondary | ICD-10-CM | POA: Diagnosis not present

## 2015-01-24 DIAGNOSIS — R918 Other nonspecific abnormal finding of lung field: Secondary | ICD-10-CM

## 2015-01-24 DIAGNOSIS — I10 Essential (primary) hypertension: Secondary | ICD-10-CM | POA: Diagnosis not present

## 2015-01-24 DIAGNOSIS — F17200 Nicotine dependence, unspecified, uncomplicated: Secondary | ICD-10-CM | POA: Diagnosis not present

## 2015-01-24 DIAGNOSIS — J449 Chronic obstructive pulmonary disease, unspecified: Secondary | ICD-10-CM | POA: Diagnosis not present

## 2015-01-24 DIAGNOSIS — C3411 Malignant neoplasm of upper lobe, right bronchus or lung: Secondary | ICD-10-CM | POA: Diagnosis present

## 2015-01-24 DIAGNOSIS — I252 Old myocardial infarction: Secondary | ICD-10-CM | POA: Diagnosis not present

## 2015-01-24 DIAGNOSIS — E785 Hyperlipidemia, unspecified: Secondary | ICD-10-CM | POA: Diagnosis not present

## 2015-01-24 DIAGNOSIS — I2510 Atherosclerotic heart disease of native coronary artery without angina pectoris: Secondary | ICD-10-CM | POA: Diagnosis not present

## 2015-01-24 HISTORY — DX: Acute pancreatitis without necrosis or infection, unspecified: K85.90

## 2015-01-24 HISTORY — DX: Unspecified osteoarthritis, unspecified site: M19.90

## 2015-01-24 LAB — PROTIME-INR
INR: 1.04 (ref 0.00–1.49)
Prothrombin Time: 13.8 seconds (ref 11.6–15.2)

## 2015-01-24 LAB — CBC
HCT: 39.7 % (ref 39.0–52.0)
Hemoglobin: 13.4 g/dL (ref 13.0–17.0)
MCH: 29.9 pg (ref 26.0–34.0)
MCHC: 33.8 g/dL (ref 30.0–36.0)
MCV: 88.6 fL (ref 78.0–100.0)
Platelets: 196 10*3/uL (ref 150–400)
RBC: 4.48 MIL/uL (ref 4.22–5.81)
RDW: 13.6 % (ref 11.5–15.5)
WBC: 6.8 10*3/uL (ref 4.0–10.5)

## 2015-01-24 LAB — COMPREHENSIVE METABOLIC PANEL
ALT: 12 U/L — ABNORMAL LOW (ref 17–63)
AST: 18 U/L (ref 15–41)
Albumin: 3.7 g/dL (ref 3.5–5.0)
Alkaline Phosphatase: 55 U/L (ref 38–126)
Anion gap: 7 (ref 5–15)
BUN: <5 mg/dL — ABNORMAL LOW (ref 6–20)
CO2: 28 mmol/L (ref 22–32)
Calcium: 9.1 mg/dL (ref 8.9–10.3)
Chloride: 100 mmol/L — ABNORMAL LOW (ref 101–111)
Creatinine, Ser: 1 mg/dL (ref 0.61–1.24)
GFR calc Af Amer: >60 mL/min (ref >60)
GFR calc non Af Amer: >60 mL/min (ref >60)
Glucose, Bld: 92 mg/dL (ref 65–99)
Potassium: 4.1 mmol/L (ref 3.5–5.1)
Sodium: 135 mmol/L (ref 135–145)
Total Bilirubin: 0.6 mg/dL (ref 0.3–1.2)
Total Protein: 7.3 g/dL (ref 6.5–8.1)

## 2015-01-24 LAB — APTT: aPTT: 29 seconds (ref 24–37)

## 2015-01-24 NOTE — Progress Notes (Signed)
Anesthesia Chart Review:  Pt is 54 year old male scheduled for video bronchoscopy with endobronchial ultrasound, lung biopsy, lymph node biopsy on 01/26/2015 with Dr. Servando Snare.   PMH includes: CAD (MI 1999, 2002; s/p angioplasty LAD; s/p CABG x4 2013), HTN, hyperlipidemia, pancreatitis, COPD, lung mass. Current smoker. BMI 32. S/p dental extractions 02/21/12.  Medications include: amlodipine, ASA, carvedilol, pravastatin.   Preoperative labs reviewed.    Chest x-ray 01/24/2015 reviewed. Persistent R perihilar mass.   EKG 01/24/2015: NSR. Incomplete RBBB. Nonspecific T wave abnormality  CT chest 01/18/2015:  1. 8.7 x 8.7 cm RIGHT hilar mass encasing the RIGHT pulmonary artery and occluding the RIGHT upper lobe bronchus. Primary bronchogenic carcinoma favored over metastatic disease in this patient with emphysema.  2. Mild airspace disease in the peripheral RIGHT upper lobe, probably postobstructive pneumonia.  3. Small RIGHT pleural effusion which may be malignant or reactive.  4. 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 Correlate for SVC syndrome findings.  5. 34 mm x 26 mm mass in the body of the pancreas, partially visible. Metastatic disease versus pancreatic adenocarcinoma are the primary considerations.  Last cardiology follow up was 10/26/13, stable sx at that time. Pt admits to chest pain at PAT over last 1-2 months but it is right-sided; also admits to increased SOB with exertion and lying flat. These are likely due to his lung mass.   Reviewed case with Dr. Linna Caprice.   If no changes, I anticipate pt can proceed with surgery as scheduled.   Willeen Cass, FNP-BC [facility]/Anesthesiology Phone: [phone] 01/24/2015 4:55 PM

## 2015-01-24 NOTE — Pre-Procedure Instructions (Signed)
[name]  01/24/2015     Your procedure is scheduled on Wednesday, January 26, 2015 at 8:30 AM.   Report to [facility] Entrance "A" Admitting Office at 6:30 AM.   Call this number if you have problems the morning of surgery: [phone]   Any questions prior to day of surgery, please call [phone] between 8 & 4 PM.   Remember:  Do not eat food or drink liquids after midnight Tuesday, 01/25/15.  Take these medicines the morning of surgery with A SIP OF WATER: Amlodipine, Carvedilol, Hydrocodone - if needed  Stop NSAIDS (Ibuprofen, Aleve, etc.) as of today.   Do not wear jewelry.  Do not wear lotions, powders, or cologne.  You may wear deodorant.  Men may shave face and neck.  Do not bring valuables to the hospital.  [facility] is not responsible for any belongings or valuables.  Contacts, dentures or bridgework may not be worn into surgery.  Leave your suitcase in the car.  After surgery it may be brought to your room.  For patients admitted to the hospital, discharge time will be determined by your treatment team.  Patients discharged the day of surgery will not be allowed to drive home.   Special instructions:  [name] - Preparing for Surgery  Before surgery, you can play an important role.  Because skin is not sterile, your skin needs to be as free of germs as possible.  You can reduce the number of germs on you skin by washing with CHG (chlorahexidine gluconate) soap before surgery.  CHG is an antiseptic cleaner which kills germs and bonds with the skin to continue killing germs even after washing.  Please DO NOT use if you have an allergy to CHG or antibacterial soaps.  If your skin becomes reddened/irritated stop using the CHG and inform your nurse when you arrive at Short Stay.  Do not shave (including legs and underarms) for at least 48 hours prior to the first CHG shower.  You may shave your face.  Please follow these instructions  carefully:   1.  Shower with CHG Soap the night before surgery and the                                morning of Surgery.  2.  If you choose to wash your hair, wash your hair first as usual with your       normal shampoo.  3.  After you shampoo, rinse your hair and body thoroughly to remove the                      Shampoo.  4.  Use CHG as you would any other liquid soap.  You can apply chg directly       to the skin and wash gently with scrungie or a clean washcloth.  5.  Apply the CHG Soap to your body ONLY FROM THE NECK DOWN.        Do not use on open wounds or open sores.  Avoid contact with your eyes, ears, mouth and genitals (private parts).  Wash genitals (private parts) with your normal soap.  6.  Wash thoroughly, paying special attention to the area where your surgery        will be performed.  7.  Thoroughly rinse your body with warm water from the neck down.  8.  DO NOT  shower/wash with your normal soap after using and rinsing off       the CHG Soap.  9.  Pat yourself dry with a clean towel.            10.  Wear clean pajamas.            11.  Place clean sheets on your bed the night of your first shower and do not        sleep with pets.  Day of Surgery  Do not apply any lotions the morning of surgery.  Please wear clean clothes to the hospital.   Please read over the following fact sheets that you were given. Pain Booklet, Coughing and Deep Breathing and Surgical Site Infection Prevention

## 2015-01-24 NOTE — Progress Notes (Signed)
Pt had a CABG done in 2013. Last cardiology visit was July, 2015. It was noted that pt needed a 6 month f/u. Wife of pt states pt didn't go or follow up. Pt denies any cardiac problems since having CABG. Only chest pain he's had in the past 1-2 months is on the right side and he's had increase sob with exertion and lying flat.

## 2015-01-24 NOTE — Progress Notes (Signed)
   01/24/15 1235  OBSTRUCTIVE SLEEP APNEA  Have you ever been diagnosed with sleep apnea through a sleep study? No  Do you snore loudly (loud enough to be heard through closed doors)?  1  Do you often feel tired, fatigued, or sleepy during the daytime (such as falling asleep during driving or talking to someone)? 1  Has anyone observed you stop breathing during your sleep? 0  Do you have, or are you being treated for high blood pressure? 1  BMI more than 35 kg/m2? 0  Age > 50 (1-yes) 1  Neck circumference greater than:Male 16 inches or larger, Male 17inches or larger? 1  Male Gender (Yes=1) 1  Obstructive Sleep Apnea Score 6

## 2015-01-25 ENCOUNTER — Encounter: Payer: Self-pay | Admitting: Family Medicine

## 2015-01-25 ENCOUNTER — Ambulatory Visit (INDEPENDENT_AMBULATORY_CARE_PROVIDER_SITE_OTHER): Payer: 59 | Admitting: Family Medicine

## 2015-01-25 VITALS — BP 120/70 | HR 80 | Temp 98.2°F | Resp 18 | Ht 70.0 in | Wt 222.0 lb

## 2015-01-25 DIAGNOSIS — Z8679 Personal history of other diseases of the circulatory system: Secondary | ICD-10-CM | POA: Diagnosis not present

## 2015-01-25 DIAGNOSIS — C3401 Malignant neoplasm of right main bronchus: Secondary | ICD-10-CM

## 2015-01-25 DIAGNOSIS — E785 Hyperlipidemia, unspecified: Secondary | ICD-10-CM | POA: Diagnosis not present

## 2015-01-25 DIAGNOSIS — Z7689 Persons encountering health services in other specified circumstances: Secondary | ICD-10-CM

## 2015-01-25 DIAGNOSIS — K8689 Other specified diseases of pancreas: Secondary | ICD-10-CM | POA: Insufficient documentation

## 2015-01-25 DIAGNOSIS — I10 Essential (primary) hypertension: Secondary | ICD-10-CM | POA: Diagnosis not present

## 2015-01-25 DIAGNOSIS — Z720 Tobacco use: Secondary | ICD-10-CM

## 2015-01-25 DIAGNOSIS — K869 Disease of pancreas, unspecified: Secondary | ICD-10-CM

## 2015-01-25 DIAGNOSIS — Z7189 Other specified counseling: Secondary | ICD-10-CM | POA: Diagnosis not present

## 2015-01-25 DIAGNOSIS — F17200 Nicotine dependence, unspecified, uncomplicated: Secondary | ICD-10-CM

## 2015-01-25 MED ORDER — GADOBENATE DIMEGLUMINE 529 MG/ML IV SOLN
20.0000 mL | Freq: Once | INTRAVENOUS | Status: AC | PRN
  Administered 2015-01-21: 20 mL via INTRAVENOUS

## 2015-01-25 NOTE — Anesthesia Preprocedure Evaluation (Addendum)
Anesthesia Evaluation  Patient identified by MRN, date of birth, ID band Patient awake    Reviewed: Allergy & Precautions, Patient's Chart, lab work & pertinent test results  History of Anesthesia Complications Negative for: history of anesthetic complications  Airway Mallampati: I  TM Distance: >3 FB     Dental  (+) Edentulous Lower, Edentulous Upper   Pulmonary shortness of breath and with exertion, COPD, Current Smoker,    Pulmonary exam normal        Cardiovascular hypertension, Pt. on home beta blockers and Pt. on medications + CAD, + Past MI and + CABG  Normal cardiovascular exam     Neuro/Psych negative psych ROS   GI/Hepatic negative GI ROS, Neg liver ROS,   Endo/Other  negative endocrine ROS  Renal/GU negative Renal ROS     Musculoskeletal  (+) Arthritis ,   Abdominal   Peds  Hematology negative hematology ROS (+)   Anesthesia Other Findings   Reproductive/Obstetrics                           Anesthesia Physical Anesthesia Plan  ASA: III  Anesthesia Plan: General   Post-op Pain Management:    Induction: Intravenous  Airway Management Planned: Oral ETT  Additional Equipment:   Intra-op Plan:   Post-operative Plan: Possible Post-op intubation/ventilation  Informed Consent: I have reviewed the patients History and Physical, chart, labs and discussed the procedure including the risks, benefits and alternatives for the proposed anesthesia with the patient or authorized representative who has indicated his/her understanding and acceptance.   Dental advisory given  Plan Discussed with: CRNA, Anesthesiologist and Surgeon  Anesthesia Plan Comments:        Anesthesia Quick Evaluation

## 2015-01-25 NOTE — Progress Notes (Signed)
Subjective:    Patient ID: [name], male    DOB: 1960/11/07, 54 y.o.   MRN: [id number]  HPI Patient is here today to establish care. Unfortunately he went to the hospital earlier this month due to right-sided pain in his chest. CT scan of the chest at that time revealed a 8.7 cm x 8.7 cm right hilar mass. Patient has seen the oncologist at [facility]. He reports that they believe he may have non-small cell lung cancer. They also found a 2 cm x 3 cm mass in the pancreas. He has a PET scan pending for tomorrow. At the present time there is no biopsy scheduled for the mass in his pancreas until they see the results of the PET scan. He does have a biopsy scheduled for the mass in his right lung with a cardiothoracic surgeon for later this week. He continues to smoke although he is trying to quit. 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 However he quit drinking years ago. Past Medical History  Diagnosis Date  . Coronary artery disease 2002    Lesion in LAD s/p angioplasty; s/p CABG x 4 Sept 2013  . Tobacco abuse   . Myocardial infarct [facility]) [id number]    stents  . Hyperlipidemia 1999  . Essential hypertension, benign 1999  . Lung mass   . COPD (chronic obstructive pulmonary disease) ([place])   . Shortness of breath dyspnea     with exertion, sleeps with more pillows under head  . Arthritis   . Pancreatitis   . Complication of anesthesia     woke up while being extubated and remembers fighting  . Cancer ([place])     lung  . Mass of pancreas    Past Surgical History  Procedure Laterality Date  . Knee surgery      left x 2  ACL repair  . Tee without cardioversion  01/02/2012    Procedure: TRANSESOPHAGEAL ECHOCARDIOGRAM (TEE);  Surgeon: [name], MD;  Location: [place];  Service: Open Heart Surgery;  Laterality: N/A;  .  Multiple extractions with alveoloplasty  02/21/2012    Procedure: MULTIPLE EXTRACION WITH ALVEOLOPLASTY;  Surgeon: Lenn Cal, DDS;  Location: [facility];  Service: Oral Surgery;  Laterality: N/A;  Extaction of tooth #'s 4,5,6,7,10,11,12,13,14,15,19,20,21,22,23,24,25,   . Left heart catheterization with coronary angiogram N/A 01/01/2012    Procedure: LEFT HEART CATHETERIZATION WITH CORONARY ANGIOGRAM;  Surgeon: Hillary Bow, MD;  Location: [facility] CATH LAB;  Service: Cardiovascular;  Laterality: N/A;  . Coronary artery bypass graft  01/02/2012    Procedure: CORONARY ARTERY BYPASS GRAFTING (CABG);  Surgeon: [name], MD;  Location: [place];  Service: Open Heart Surgery;  Laterality: N/A;  times three using left internal mammary artery and right endoscopically harvested saphenous vein  . Coronary angioplasty  1999, 2002   Current Outpatient Prescriptions on File Prior to Visit  Medication Sig Dispense Refill  . amLODipine (NORVASC) 5 MG tablet TAKE ONE (1) TABLET BY MOUTH EVERY DAY 90 tablet 6  . aspirin EC 81 MG tablet Take 81 mg by mouth daily.    . carvedilol (COREG) 6.25 MG tablet TAKE ONE TABLET BY MOUTH TWICE A DAY WITH A MEAL 180 tablet 3  . HYDROcodone-acetaminophen (NORCO) 10-325 MG tablet Take 1 tablet by mouth every 6 (six) hours as needed. 90 tablet 0  .  ibuprofen (ADVIL,MOTRIN) 200 MG tablet Take 600 mg by mouth every 6 (six) hours as needed for moderate pain.     . pravastatin (PRAVACHOL) 40 MG tablet TAKE ONE (1) TABLET BY MOUTH EVERY DAY 90 tablet 6   No current facility-administered medications on file prior to visit.   Allergies  Allergen Reactions  . Lisinopril Cough  . Tea Hives   Social History   Social History  . Marital Status: Married    Spouse Name: N/A  . Number of Children: 5  . Years of Education: N/A   Occupational History  . Works in [employer]  . Smoking status: Current Every Day Smoker -- 1.00 packs/day for 35 years      Types: Cigarettes    Start date: 06/30/1976  . Smokeless tobacco: Never Used  . Alcohol Use: Yes     Comment: maybe a 12 pack/mth if that - very rare since 2013  . Drug Use: No  . Sexual Activity: Yes   Other Topics Concern  . Not on file   Social History Narrative   Family History  Problem Relation Age of Onset  . Cancer Neg Hx   . Heart disease Father   . Diabetes Father   . Hyperlipidemia Father   . Hypertension Father   . Hyperlipidemia Mother   . Hypertension Mother   . Stroke Mother   . Diabetes Paternal Grandmother       Review of Systems  All other systems reviewed and are negative.      Objective:   Physical Exam  Constitutional: He is oriented to person, place, and time. He appears well-developed and well-nourished. No distress.  HENT:  Head: Normocephalic and atraumatic.  Right Ear: External ear normal.  Left Ear: External ear normal.  Nose: Nose normal.  Mouth/Throat: Oropharynx is clear and moist. No oropharyngeal exudate.  Eyes: Conjunctivae and EOM are normal. Pupils are equal, round, and reactive to light. Right eye exhibits no discharge. Left eye exhibits no discharge. No scleral icterus.  Neck: Normal range of motion. Neck supple. No JVD present. No tracheal deviation present. No thyromegaly present.  Cardiovascular: Normal rate, regular rhythm, normal heart sounds and intact distal pulses.  Exam reveals no gallop and no friction rub.   No murmur heard. Pulmonary/Chest: Effort normal and breath sounds normal. No stridor. No respiratory distress. He has no wheezes. He has no rales. He exhibits no tenderness.  Abdominal: Soft. Bowel sounds are normal. He exhibits no distension and no mass. There is no tenderness. There is no rebound and no guarding.  Musculoskeletal: Normal range of motion. He exhibits no edema.  Lymphadenopathy:    He has no cervical adenopathy.  Neurological: He is alert and oriented to person, place, and time. He has normal  reflexes. He displays normal reflexes. No cranial nerve deficit. He exhibits normal muscle tone. Coordination normal.  Skin: No rash noted. He is not diaphoretic. No erythema.  Psychiatric: He has a normal mood and affect. His behavior is normal. Judgment and thought content normal.  Vitals reviewed.         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 Given his complex situation at present, I recommended against prostate cancer screening as well as  a colonoscopy for colon cancer screening. I strongly recommended smoking cessation. I believe this gives him the best chance of winning his battle against cancer and also preventing complications. I strongly recommended a flu shot as well as Pneumovax 23. He defers this at the present time. I would like to have the patient return for a fasting lipid panel to monitor his cholesterol. His CBC and CMP were just checked. I explained to the patient that his upcoming struggle against cancer will place a tremendous strain on his body. The best way to prevent complications from this is to make sure that his cardiac condition is managed appropriately. This means controlling his blood pressure and also making sure that his cholesterol is under control. Also it means he needs to quit smoking to prevent complications related to this including COPD, pneumonia. It also means preventing infections as best we can by getting his flu shot as well as his pneumonia shot. I will gladly try to assist in the management of this patient however I can. He simply needs to call me if he needs medication to help him sleep or for nausea or for pain. At the present time his oncologist is prescribing him pain medication and this is  controlling his pain rather well.

## 2015-01-26 ENCOUNTER — Encounter (HOSPITAL_COMMUNITY)
Admission: RE | Disposition: A | Discharge status: Home / Self Care | Payer: Self-pay | Source: Ambulatory Visit | Attending: Cardiothoracic Surgery

## 2015-01-26 ENCOUNTER — Ambulatory Visit (HOSPITAL_COMMUNITY)
Admission: RE | Admit: 2015-01-26 | Discharge: 2015-01-26 | Disposition: A | Discharge status: Home / Self Care | Payer: 59 | Source: Ambulatory Visit | Attending: Cardiothoracic Surgery | Admitting: Cardiothoracic Surgery

## 2015-01-26 ENCOUNTER — Ambulatory Visit (HOSPITAL_COMMUNITY): Payer: 59 | Admitting: Anesthesiology

## 2015-01-26 ENCOUNTER — Ambulatory Visit (HOSPITAL_COMMUNITY): Payer: 59 | Admitting: Emergency Medicine

## 2015-01-26 ENCOUNTER — Encounter (HOSPITAL_COMMUNITY): Payer: Self-pay | Admitting: *Deleted

## 2015-01-26 DIAGNOSIS — Z951 Presence of aortocoronary bypass graft: Secondary | ICD-10-CM | POA: Insufficient documentation

## 2015-01-26 DIAGNOSIS — J449 Chronic obstructive pulmonary disease, unspecified: Secondary | ICD-10-CM | POA: Insufficient documentation

## 2015-01-26 DIAGNOSIS — C3411 Malignant neoplasm of upper lobe, right bronchus or lung: Secondary | ICD-10-CM | POA: Diagnosis not present

## 2015-01-26 DIAGNOSIS — E785 Hyperlipidemia, unspecified: Secondary | ICD-10-CM | POA: Insufficient documentation

## 2015-01-26 DIAGNOSIS — I252 Old myocardial infarction: Secondary | ICD-10-CM | POA: Insufficient documentation

## 2015-01-26 DIAGNOSIS — I10 Essential (primary) hypertension: Secondary | ICD-10-CM | POA: Insufficient documentation

## 2015-01-26 DIAGNOSIS — I2510 Atherosclerotic heart disease of native coronary artery without angina pectoris: Secondary | ICD-10-CM | POA: Insufficient documentation

## 2015-01-26 DIAGNOSIS — F17200 Nicotine dependence, unspecified, uncomplicated: Secondary | ICD-10-CM | POA: Insufficient documentation

## 2015-01-26 DIAGNOSIS — R918 Other nonspecific abnormal finding of lung field: Secondary | ICD-10-CM

## 2015-01-26 DIAGNOSIS — R590 Localized enlarged lymph nodes: Secondary | ICD-10-CM | POA: Diagnosis not present

## 2015-01-26 DIAGNOSIS — R222 Localized swelling, mass and lump, trunk: Secondary | ICD-10-CM | POA: Diagnosis not present

## 2015-01-26 HISTORY — PX: LYMPH NODE BIOPSY: SHX201

## 2015-01-26 HISTORY — PX: LUNG BIOPSY: SHX5088

## 2015-01-26 HISTORY — PX: VIDEO BRONCHOSCOPY WITH ENDOBRONCHIAL ULTRASOUND: SHX6177

## 2015-01-26 SURGERY — BRONCHOSCOPY, WITH EBUS
Anesthesia: General

## 2015-01-26 MED ORDER — FENTANYL CITRATE (PF) 100 MCG/2ML IJ SOLN
INTRAMUSCULAR | Status: DC | PRN
  Administered 2015-01-26: 150 ug via INTRAVENOUS

## 2015-01-26 MED ORDER — PHENYLEPHRINE HCL 10 MG/ML IJ SOLN
INTRAMUSCULAR | Status: DC | PRN
  Administered 2015-01-26 (×2): 80 ug via INTRAVENOUS
  Administered 2015-01-26: 40 ug via INTRAVENOUS
  Administered 2015-01-26 (×2): 80 ug via INTRAVENOUS

## 2015-01-26 MED ORDER — LIDOCAINE HCL 4 % MT SOLN
OROMUCOSAL | Status: DC | PRN
  Administered 2015-01-26: 4 mL via TOPICAL

## 2015-01-26 MED ORDER — ONDANSETRON HCL 4 MG/2ML IJ SOLN
INTRAMUSCULAR | Status: DC | PRN
  Administered 2015-01-26: 4 mg via INTRAVENOUS

## 2015-01-26 MED ORDER — LACTATED RINGERS IV SOLN
INTRAVENOUS | Status: DC | PRN
  Administered 2015-01-26 (×2): via INTRAVENOUS

## 2015-01-26 MED ORDER — NEOSTIGMINE METHYLSULFATE 10 MG/10ML IV SOLN
INTRAVENOUS | Status: DC | PRN
  Administered 2015-01-26: 4 mg via INTRAVENOUS

## 2015-01-26 MED ORDER — EPINEPHRINE HCL 1 MG/ML IJ SOLN
INTRAMUSCULAR | Status: DC | PRN
  Administered 2015-01-26: 1 mg via ENDOTRACHEOPULMONARY

## 2015-01-26 MED ORDER — GLYCOPYRROLATE 0.2 MG/ML IJ SOLN
INTRAMUSCULAR | Status: DC | PRN
  Administered 2015-01-26: 0.6 mg via INTRAVENOUS

## 2015-01-26 MED ORDER — DEXAMETHASONE SODIUM PHOSPHATE 4 MG/ML IJ SOLN
INTRAMUSCULAR | Status: AC
  Filled 2015-01-26: qty 2

## 2015-01-26 MED ORDER — MIDAZOLAM HCL 2 MG/2ML IJ SOLN
INTRAMUSCULAR | Status: AC
  Filled 2015-01-26: qty 4

## 2015-01-26 MED ORDER — ONDANSETRON HCL 4 MG/2ML IJ SOLN
INTRAMUSCULAR | Status: AC
  Filled 2015-01-26: qty 2

## 2015-01-26 MED ORDER — ROCURONIUM BROMIDE 50 MG/5ML IV SOLN
INTRAVENOUS | Status: AC
  Filled 2015-01-26: qty 1

## 2015-01-26 MED ORDER — ROCURONIUM BROMIDE 100 MG/10ML IV SOLN
INTRAVENOUS | Status: DC | PRN
  Administered 2015-01-26: 50 mg via INTRAVENOUS
  Administered 2015-01-26: 20 mg via INTRAVENOUS

## 2015-01-26 MED ORDER — PROPOFOL 10 MG/ML IV BOLUS
INTRAVENOUS | Status: AC
  Filled 2015-01-26: qty 20

## 2015-01-26 MED ORDER — DEXAMETHASONE SODIUM PHOSPHATE 4 MG/ML IJ SOLN
INTRAMUSCULAR | Status: DC | PRN
  Administered 2015-01-26: 8 mg via INTRAVENOUS

## 2015-01-26 MED ORDER — EPINEPHRINE HCL 1 MG/ML IJ SOLN
INTRAMUSCULAR | Status: AC
  Filled 2015-01-26: qty 1

## 2015-01-26 MED ORDER — CARVEDILOL 6.25 MG PO TABS
6.2500 mg | ORAL_TABLET | Freq: Once | ORAL | Status: AC
  Administered 2015-01-26: 6.25 mg via ORAL

## 2015-01-26 MED ORDER — FENTANYL CITRATE (PF) 250 MCG/5ML IJ SOLN
INTRAMUSCULAR | Status: AC
  Filled 2015-01-26: qty 5

## 2015-01-26 MED ORDER — LIDOCAINE HCL (CARDIAC) 20 MG/ML IV SOLN
INTRAVENOUS | Status: AC
  Filled 2015-01-26: qty 5

## 2015-01-26 MED ORDER — 0.9 % SODIUM CHLORIDE (POUR BTL) OPTIME
TOPICAL | Status: DC | PRN
  Administered 2015-01-26: 1000 mL

## 2015-01-26 MED ORDER — PROPOFOL 10 MG/ML IV BOLUS
INTRAVENOUS | Status: DC | PRN
Start: 2015-01-26 — End: 2015-01-26
  Administered 2015-01-26: 200 mg via INTRAVENOUS

## 2015-01-26 MED ORDER — MIDAZOLAM HCL 5 MG/5ML IJ SOLN
INTRAMUSCULAR | Status: DC | PRN
  Administered 2015-01-26: 2 mg via INTRAVENOUS

## 2015-01-26 MED ORDER — CARVEDILOL 3.125 MG PO TABS
ORAL_TABLET | ORAL | Status: AC
  Filled 2015-01-26: qty 2

## 2015-01-26 SURGICAL SUPPLY — 25 items
BRUSH CYTOL CELLEBRITY 1.5X140 (MISCELLANEOUS) ×2 IMPLANT
CANISTER SUCTION 2500CC (MISCELLANEOUS) ×2 IMPLANT
CONT SPEC 4OZ CLIKSEAL STRL BL (MISCELLANEOUS) ×4 IMPLANT
COVER DOME SNAP 22 D (MISCELLANEOUS) ×2 IMPLANT
COVER TABLE BACK 60X90 (DRAPES) ×2 IMPLANT
FILTER STRAW FLUID ASPIR (MISCELLANEOUS) ×2 IMPLANT
FORCEPS BIOP RJ4 1.8 (CUTTING FORCEPS) ×2 IMPLANT
GAUZE SPONGE 4X4 12PLY STRL (GAUZE/BANDAGES/DRESSINGS) ×2 IMPLANT
GLOVE BIO SURGEON STRL SZ 6.5 (GLOVE) ×2 IMPLANT
GLOVE BIO SURGEON STRL SZ7 (GLOVE) ×2 IMPLANT
KIT CLEAN ENDO COMPLIANCE (KITS) ×4 IMPLANT
KIT ROOM TURNOVER OR (KITS) ×2 IMPLANT
MARKER SKIN DUAL TIP RULER LAB (MISCELLANEOUS) ×2 IMPLANT
NEEDLE BIOPSY TRANSBRONCH 21G (NEEDLE) IMPLANT
NEEDLE BLUNT 18X1 FOR OR ONLY (NEEDLE) IMPLANT
NEEDLE SONO TIP II EBUS (NEEDLE) ×2 IMPLANT
NS IRRIG 1000ML POUR BTL (IV SOLUTION) ×2 IMPLANT
OIL SILICONE PENTAX (PARTS (SERVICE/REPAIRS)) ×2 IMPLANT
PAD ARMBOARD 7.5X6 YLW CONV (MISCELLANEOUS) ×6 IMPLANT
SYR 20CC LL (SYRINGE) ×2 IMPLANT
SYR 20ML ECCENTRIC (SYRINGE) ×2 IMPLANT
SYRINGE 10CC LL (SYRINGE) ×2 IMPLANT
TOWEL OR 17X24 6PK STRL BLUE (TOWEL DISPOSABLE) ×2 IMPLANT
TRAP SPECIMEN MUCOUS 40CC (MISCELLANEOUS) ×2 IMPLANT
TUBE CONNECTING 20X1/4 (TUBING) ×2 IMPLANT

## 2015-01-26 NOTE — Transfer of Care (Signed)
Immediate Anesthesia Transfer of Care Note  Patient: [name]  Procedure(s) Performed: Procedure(s): VIDEO BRONCHOSCOPY WITH ENDOBRONCHIAL ULTRASOUND (N/A) LUNG BIOPSY (N/A) LYMPH NODE BIOPSY (N/A)  Patient Location: PACU  Anesthesia Type:General  Level of Consciousness: awake, alert , oriented and patient cooperative  Airway & Oxygen Therapy: Patient Spontanous Breathing and Patient connected to nasal cannula oxygen  Post-op Assessment: Report given to RN and Post -op Vital signs reviewed and stable  Post vital signs: Reviewed and stable  Last Vitals:  Filed Vitals:   01/26/15 0648  BP: 131/86  Pulse: 82  Temp: 36.7 C  Resp: 20    Complications: No apparent anesthesia complications

## 2015-01-26 NOTE — Discharge Instructions (Signed)
Flexible Bronchoscopy, Care After Refer to this sheet in the next few weeks. These instructions provide you with information on caring for yourself after your procedure. Your health care provider may also give you more specific instructions. Your treatment has been planned according to current medical practices, but problems sometimes occur. Call your health care provider if you have any problems or questions after your procedure.  WHAT TO EXPECT AFTER THE PROCEDURE It is normal to have the following symptoms for 24-48 hours after the procedure:   Increased cough.  Low-grade fever.  Sore throat or hoarse voice.  Small streaks of blood in your thick spit (sputum) if tissue samples were taken (biopsy). HOME CARE INSTRUCTIONS   Do not eat or drink anything for 2 hours after your procedure. Your nose and throat were numbed by medicine. If you try to eat or drink before the medicine wears off, food or drink could go into your lungs or you could burn yourself. After the numbness is gone and your cough and gag reflexes have returned, you may eat soft food and drink liquids slowly.   The day after the procedure, you can go back to your normal diet.   You may resume normal activities.   Keep all follow-up visits as directed by your health care provider. It is important to keep all your appointments, especially if tissue samples were taken for testing (biopsy). SEEK IMMEDIATE MEDICAL CARE IF:   You have increasing shortness of breath.   You become light-headed or faint.   You have chest pain.   You have any new concerning symptoms.  You cough up more than a small amount of blood.  The amount of blood you cough up increases. MAKE SURE YOU:  Understand these instructions.  Will watch your condition.  Will get help right away if you are not doing well or get worse.   This information is not intended to replace advice given to you by your health care provider. Make sure you discuss  any questions you have with your health care provider.   Document Released: 10/27/2004 Document Revised: 04/30/2014 Document Reviewed: 12/12/2012 Elsevier Interactive Patient Education [employer].

## 2015-01-26 NOTE — Anesthesia Postprocedure Evaluation (Signed)
Anesthesia Post Note  Patient: [name]  Procedure(s) Performed: Procedure(s) (LRB): VIDEO BRONCHOSCOPY WITH ENDOBRONCHIAL ULTRASOUND (N/A) LUNG BIOPSY (N/A) LYMPH NODE BIOPSY (N/A)  Anesthesia type: general  Patient location: PACU  Post pain: Pain level controlled  Post assessment: Patient's Cardiovascular Status Stable  Last Vitals:  Filed Vitals:   01/26/15 1021  BP: 107/67  Pulse: 79  Temp:   Resp: 15    Post vital signs: Reviewed and stable  Level of consciousness: sedated  Complications: No apparent anesthesia complications

## 2015-01-26 NOTE — Brief Op Note (Addendum)
      [address]       [place] [address]             [phone]      01/26/2015  9:46 AM  PATIENT:  [name]  54 y.o. male  PRE-OPERATIVE DIAGNOSIS:  LUNG MASS  POST-OPERATIVE DIAGNOSIS:  Non small cell lung cancerby quick smear   PROCEDURE:  Procedure(s): VIDEO BRONCHOSCOPY WITH ENDOBRONCHIAL ULTRASOUND (N/A) LUNG BIOPSY (N/A) LYMPH NODE BIOPSY (N/A)  SURGEON:  Surgeon(s) and Role:    * [name], MD - Primary   ANESTHESIA:   general  EBL:  Total I/O In: 1000 [I.V.:1000] Out: -   BLOOD ADMINISTERED:none  DRAINS: none   LOCAL MEDICATIONS USED:  NONE  SPECIMEN:  Source of Specimen:  # 7 nodes and right upper lobe lung mass  DISPOSITION OF SPECIMEN:  PATHOLOGY  COUNTS:  YES   DICTATION: .Dragon Dictation  PLAN OF CARE: Discharge to home after PACU  PATIENT DISPOSITION:  PACU - hemodynamically stable.   Delay start of Pharmacological VTE agent (>24hrs) due to surgical blood loss or risk of bleeding: yes

## 2015-01-26 NOTE — H&P (Signed)
[address]       [place] [address]             [phone]                    [name] [place] Medical Record #[id number] Date of Birth: [birth year]  Referring: No ref. provider found Primary Care: [name], MD  Chief Complaint:   Lung Mass  History of Present Illness:    [name] 54 y.o. male is seen in the office  today for newly discovered right lung mass. The patient was previously seen in September 2013 at which time he underwent urgent coronary artery bypass grafting. He now returns with a newly discovered right lung mass. He noted pain in his right ribs several days ago and went to the [facility] emergency room chest x-ray showed a right hilar mass this was confirmed by CT of the chest. The patient is referred for further evaluation for radiographic evidence of primary lung cancer. The patient has been a long-term smoker and previously used alcohol heavily. He stopped smoking and consuming alcohol found his coronary artery bypass grafting. He continues to avoid alcohol, but after his wife was involved in a motor vehicle accident he resumed smoking.   He denies any recent weight loss or gain, has had cough but no hemoptysis. He specifically denies any abdominal discomfort. He denies any notable facial or neck swelling suggestive of superior vena cava syndrome.     Current Activity/ Functional Status:  Patient is independent with mobility/ambulation, transfers, ADL's, IADL's.   Zubrod Score: At the time of surgery this patient's most appropriate activity status/level should be described as: '[]'$     0    Normal activity, no symptoms '[]'$     1    Restricted in physical strenuous activity but ambulatory, able to do out light work '[]'$     2    Ambulatory and capable of self care, unable to do work activities, up and about               >50 % of waking hours                              '[]'$     3    Only limited self care, in bed greater than 50%  of waking hours '[]'$     4    Completely disabled, no self care, confined to bed or chair '[]'$     5    Moribund   Past Medical History  Diagnosis Date  . Coronary artery disease 2002    Lesion in LAD s/p angioplasty; s/p CABG x 4 Sept 2013  . Tobacco abuse   . Myocardial infarct [facility]) [id number]    stents  . Hyperlipidemia 1999  . Essential hypertension, benign 1999  . Lung mass   . COPD (chronic obstructive pulmonary disease) ([place])   . Shortness of breath dyspnea     with exertion, sleeps with more pillows under head  . Arthritis   . Pancreatitis   . Complication of anesthesia     woke up while being extubated and remembers fighting  . Cancer ([place])     lung  . Mass of pancreas     Past Surgical History  Procedure Laterality Date  . Knee surgery      left x 2  ACL repair  . Tee without cardioversion  01/02/2012    Procedure: TRANSESOPHAGEAL ECHOCARDIOGRAM (TEE);  Surgeon: [name], MD;  Location: [facility];  Service: Open Heart Surgery;  Laterality: N/A;  . Multiple extractions with alveoloplasty  02/21/2012    Procedure: MULTIPLE EXTRACION WITH ALVEOLOPLASTY;  Surgeon: Lenn Cal, DDS;  Location: [facility];  Service: Oral Surgery;  Laterality: N/A;  Extaction of tooth #'s 4,5,6,7,10,11,12,13,14,15,19,20,21,22,23,24,25,   . Left heart catheterization with coronary angiogram N/A 01/01/2012    Procedure: LEFT HEART CATHETERIZATION WITH CORONARY ANGIOGRAM;  Surgeon: Hillary Bow, MD;  Location: [facility] C[facility];  Service: Cardiovascular;  Laterality: N/A;  . Coronary artery bypass graft  01/02/2012    Procedure: CORONARY ARTERY BYPASS GRAFTING (CABG);  Surgeon: [name], MD;  Location: [facility];  Service: Open Heart Surgery;  Laterality: N/A;  times three using left internal mammary artery and right endoscopically harvested saphenous vein  . Coronary angioplasty  1999, 2002    Family History  Problem Relation Age of Onset  . Cancer Neg Hx   . Heart disease Father   .  Diabetes Father   . Hyperlipidemia Father   . Hypertension Father   . Hyperlipidemia Mother   . Hypertension Mother   . Stroke Mother   . Diabetes Paternal Grandmother     Social History   Social History  . Marital Status: Married    Spouse Name: N/A  . Number of Children: 5  . Years of Education: N/A   Occupational History  . Works in [employer]  . Smoking status: Current Every Day Smoker -- 1.00 packs/day for 35 years    Types: Cigarettes    Start date: 06/30/1976  . Smokeless tobacco: Never Used  . Alcohol Use: Yes     Comment: maybe a 12 pack/mth if that - very rare since 2013  . Drug Use: No  . Sexual Activity: Yes   Other Topics Concern  . Not on file   Social History Narrative    History  Smoking status  . Current Every Day Smoker -- 1.00 packs/day for 35 years  . Types: Cigarettes  . Start date: 06/30/1976  Smokeless tobacco  . Never Used    History  Alcohol Use  . Yes    Comment: maybe a 12 pack/mth if that - very rare since 2013     Allergies  Allergen Reactions  . Lisinopril Cough  . Tea Hives    No current facility-administered medications for this encounter.      Review of Systems:     Cardiac Review of Systems: Y or N  Chest Pain [ y   ]  Resting SOB [ n  ] Exertional SOB  [ y ]  Vertell Limber Florencio.Farrier  ]   Pedal Edema [ n  ]    Palpitations [n  ] Syncope  [n  ]   Presyncope [n   ]  General Review of Systems: [Y] = yes [  ]=no Constitional: recent weight change [  ];  Wt loss over the last 3 months [   ] anorexia [  ]; fatigue [  ]; nausea [  ]; night sweats [  ]; fever [  ]; or chills [  ];          Dental: poor dentition[  ]; Last Dentist visit:   Eye : blurred vision [  ]; diplopia [   ]; vision changes [  ];  Amaurosis fugax[  ]; Resp:  cough [ y ];  wheezing[ yy ];  hemoptysis[ n ]; shortness of breath[ n ]; paroxysmal nocturnal dyspnea[y  ]; dyspnea on exertion[  ]; or orthopnea[  ];  GI:  gallstones[  ],  vomiting[  ];  dysphagia[  ]; melena[  ];  hematochezia [  ]; heartburn[  ];   Hx of  Colonoscopy[ n ]; GU: kidney stones [  ]; hematuria[  ];   dysuria [  ];  nocturia[  ];  history of     obstruction [  ]; urinary frequency [  ]             Skin: rash, swelling[  ];, hair loss[  ];  peripheral edema[  ];  or itching[  ]; Musculosketetal: myalgias[  ];  joint swelling[  ];  joint erythema[  ];  joint pain[  ];  back pain[  ];  Heme/Lymph: bruising[  ];  bleeding[  ];  anemia[  ];  Neuro: TIA[  ];  headaches[n  ];  stroke[  ];  vertigo[n  ];  seizures[n  ];   paresthesias[  ];  difficulty walking[n  ];  Psych:depression[  ]; anxiety[  ];  Endocrine: diabetes[  ];  thyroid dysfunction[  ];  Immunizations: Flu up to date [ n ]; Pneumococcal up to date Florencio.Farrier  ];  Other:  Physical Exam: There were no vitals taken for this visit.  PHYSICAL EXAMINATION: General appearance: alert, cooperative, appears older than stated age and no distress Head: Normocephalic, without obvious abnormality, atraumatic, right External juglar vein notable but no facial swelling Neck: no adenopathy, no carotid bruit,  Mild JVD right neck, supple, symmetrical, trachea midline and thyroid not enlarged, symmetric, no tenderness/mass/nodules Lymph nodes: Cervical, supraclavicular, and axillary nodes normal. Resp: diminished breath sounds bibasilar and rhonchi bibasilar Back: symmetric, no curvature. ROM normal. No CVA tenderness. Cardio: regular rate and rhythm, S1, S2 normal, no murmur, click, rub or gallop GI: soft, non-tender; bowel sounds normal; no masses,  no organomegaly Extremities: extremities normal, atraumatic, no cyanosis or edema and Homans sign is negative, no sign of DVT Neurologic: Grossly normal  Diagnostic Studies & Laboratory data:     Recent Radiology Findings:    Mr Kizzie Fantasia Contrast  01/21/2015   CLINICAL DATA:  Lung cancer staging. No neurologic symptoms are reported. Initial encounter.  EXAM:  MRI HEAD WITHOUT AND WITH CONTRAST  TECHNIQUE: Multiplanar, multiecho pulse sequences of the brain and surrounding structures were obtained without and with intravenous contrast.  CONTRAST:  MultiHance 20 mL.  COMPARISON:  06/29/2010.  FINDINGS: No evidence for acute infarction, hemorrhage, mass lesion, hydrocephalus, or extra-axial fluid. Go normal for age cerebral volume. Minor white matter disease, nonspecific and stable.  Pituitary, pineal, and cerebellar tonsils unremarkable. No upper cervical lesions.  Flow voids are maintained throughout the carotid, basilar, and vertebral arteries. There are no areas of chronic hemorrhage.  Post infusion, no abnormal enhancement of the brain or meninges. Dominant LEFT venous system. No signs of venous thrombosis.  Visualized calvarium, skull base, and upper cervical osseous structures unremarkable. Scalp and extracranial soft tissues, orbits, sinuses, and mastoids show no acute process.  IMPRESSION: No acute intracranial findings. No signs of metastatic disease. Minor white matter disease, stable and nonspecific.   Electronically Signed   By: Staci Righter M.D.   On: 01/21/2015 14:48  Ct Chest W Contrast  01/18/2015   CLINICAL DATA:  New diagnosis of lung cancer on chest radiograph earlier today. Further  evaluation recommended. RIGHT hilar mass.  EXAM: CT CHEST WITH CONTRAST  TECHNIQUE: Multidetector CT imaging of the chest was performed during intravenous contrast administration.  CONTRAST:  1m OMNIPAQUE IOHEXOL 300 MG/ML  SOLN  COMPARISON:  01/18/2015 chest radiograph.  FINDINGS: Musculoskeletal: Thoracic vertebral body height is preserved. Median sternotomy/ CABG. Central canal grossly appears patent by CT. No destructive rib lesions or transthoracic spread of tumor.  Lungs: Centrilobular and paraseptal emphysema is present. Very large RIGHT hilar mass extends cranial to the RIGHT pulmonary artery and pulmonary hilum. The mass measures 8.7 cm transverse by 8.7 cm AP.  The mass is centered in the RIGHT hilum with mediastinal invasion.  There is atelectasis and airspace disease distal to the mass suspicious for postobstructive pneumonia in the RIGHT upper lobe.  Central airways: The RIGHT upper lobe bronchus is completely occluded. Bronchus cut off is present on the coronal images (image 64 series 4). Trachea and LEFT central airways patent. The RIGHT middle and RIGHT lower lobe bronchi remain patent.  Vasculature: Mass encases the RIGHT pulmonary artery and almost completely obliterates the lumen. No acute arterial abnormality. CABG. Descending aortic atherosclerosis with mural plaque and calcification.  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 The azygos vein is engorged, probably representing a source of collateral flow around the obstructed superior vena cava.  Effusions: Small RIGHT dependently layering pleural effusion which may be reactive to airspace disease or malignant. No LEFT pleural effusion is evident. No pericardial effusion.  Lymphadenopathy: No axillary adenopathy. No LEFT hilar adenopathy. Precarinal and LEFT paratracheal lymph nodes are enlarged. Enlarged node measuring 14 mm x 19 mm just to the RIGHT of the AP window.  Esophagus: Grossly normal.  Upper abdomen: Both adrenal glands are within normal limits. No hepatic metastases are visible. There is a mass in the body of the pancreas with mass effect on the adjacent splenic vein. This mass measures 3.4 cm x 2.6 cm.  Other: None.  IMPRESSION: 1. 8.7 x 8.7 cm RIGHT hilar mass encasing the RIGHT pulmonary artery and occluding the RIGHT upper lobe bronchus. Primary bronchogenic carcinoma favored over metastatic disease in this patient with emphysema. 2. Mild airspace disease in the peripheral RIGHT upper lobe, probably postobstructive pneumonia. 3. Small RIGHT pleural effusion which may be malignant or reactive. 4. Occlusion of the superior vena cava with collateral flow through the  azygous system. Correlate for SVC syndrome findings. 5. 34 mm x 26 mm mass in the body of the pancreas, partially visible. Metastatic disease versus pancreatic adenocarcinoma are the primary considerations.   Electronically Signed   By: GDereck LigasM.D.   On: 01/18/2015 12:54   I have independently reviewed the above radiology studies  and reviewed the findings with the patient.    I have independently reviewed the above radiologic studies.  Recent Lab Findings: Lab Results  Component Value Date   WBC 6.8 01/24/2015   HGB 13.4 01/24/2015   HCT 39.7 01/24/2015   PLT 196 01/24/2015   GLUCOSE 92 01/24/2015   CHOL 201* 01/01/2012   TRIG 644* 01/01/2012   HDL 22* 01/01/2012   LDLDIRECT 124.8 06/28/2010   LDLCALC UNABLE TO CALCULATE IF TRIGLYCERIDE OVER 400 mg/dL 01/01/2012   ALT 12* 01/24/2015   AST 18 01/24/2015   NA 135 01/24/2015   K 4.1 01/24/2015   CL 100* 01/24/2015   CREATININE 1.00 01/24/2015   BUN <5* 01/24/2015   CO2 28 01/24/2015   TSH 2.35 04/14/2010  INR 1.04 01/24/2015   HGBA1C 5.3 01/02/2012      Assessment / Plan:   Advanced stage Lung cancer, 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 Pancreatic Mass of unknown etiology at this point-    34 mm x 26 mm mass in the body of the pancreas has previous distant history of pancreatitis  History of CAD, s/p CABG 01/02/2012  I reviewed with the patient the probable diagnosis of at least stage IIIB carcinoma the lung. I recommended to him we proceed with obtaining a PET scan and MRI of the brain to fully stage his lung cancer. With the central location of the mass I recommended to him that we proceed with bronchoscopy ebus and biopsy of the right hilar mass to obtain a tissue diagnosis. We'll tentatively plan this for Monday, October 3.  PET scheduled for Oct  5  The goals risks and alternatives of the planned surgical procedure Bronchoscopy  and EBUS with biopsy  have been discussed with the patient in detail. The risks of the procedure including death, infection, stroke, myocardial infarction, bleeding, blood transfusion have all been discussed specifically.  I have quoted Roby Lofts a 1% of perioperative mortality and a complication rate as high as 10 %. The patient's questions have been answered.Gannon Heinzman is willing  to proceed with the planned procedure.  [name] MD      [address] [place] [address] Office [phone]   [place]eeper [phone]  01/26/2015 6:46 AM

## 2015-01-27 ENCOUNTER — Other Ambulatory Visit: Payer: 59

## 2015-01-27 ENCOUNTER — Telehealth: Payer: Self-pay | Admitting: Cardiothoracic Surgery

## 2015-01-27 ENCOUNTER — Encounter (HOSPITAL_COMMUNITY): Payer: Self-pay | Admitting: Cardiothoracic Surgery

## 2015-01-27 ENCOUNTER — Ambulatory Visit (HOSPITAL_COMMUNITY)
Admission: RE | Admit: 2015-01-27 | Discharge: 2015-01-27 | Disposition: A | Discharge status: Home / Self Care | Payer: 59 | Source: Ambulatory Visit | Attending: Cardiothoracic Surgery | Admitting: Cardiothoracic Surgery

## 2015-01-27 DIAGNOSIS — C3411 Malignant neoplasm of upper lobe, right bronchus or lung: Secondary | ICD-10-CM | POA: Diagnosis not present

## 2015-01-27 DIAGNOSIS — I2510 Atherosclerotic heart disease of native coronary artery without angina pectoris: Secondary | ICD-10-CM

## 2015-01-27 DIAGNOSIS — E782 Mixed hyperlipidemia: Secondary | ICD-10-CM

## 2015-01-27 DIAGNOSIS — R918 Other nonspecific abnormal finding of lung field: Secondary | ICD-10-CM

## 2015-01-27 DIAGNOSIS — I10 Essential (primary) hypertension: Secondary | ICD-10-CM

## 2015-01-27 LAB — GLUCOSE, CAPILLARY: Glucose-Capillary: 98 mg/dL (ref 65–99)

## 2015-01-27 MED ORDER — FLUDEOXYGLUCOSE F - 18 (FDG) INJECTION
10.9100 | Freq: Once | INTRAVENOUS | Status: DC | PRN
  Administered 2015-01-27: 10.91 via INTRAVENOUS
  Filled 2015-01-27: qty 10.91

## 2015-01-27 NOTE — Telephone Encounter (Signed)
Called patient and results of finasl path and PET scan explained to him including 5.7 cm aaa. To see oncology Monday 10/10 [name] MD      [address] [place] [address] Office [phone]   [place]

## 2015-01-28 ENCOUNTER — Telehealth (HOSPITAL_COMMUNITY): Payer: Self-pay | Admitting: Hematology & Oncology

## 2015-01-28 ENCOUNTER — Other Ambulatory Visit (HOSPITAL_COMMUNITY): Payer: Self-pay | Admitting: Hematology & Oncology

## 2015-01-28 DIAGNOSIS — K8689 Other specified diseases of pancreas: Secondary | ICD-10-CM

## 2015-01-28 LAB — CBC WITH DIFFERENTIAL/PLATELET
BASOS PCT: 0 % (ref 0–1)
Basophils Absolute: 0 10*3/uL (ref 0.0–0.1)
EOS ABS: 0.1 10*3/uL (ref 0.0–0.7)
Eosinophils Relative: 1 % (ref 0–5)
HCT: 36.9 % — ABNORMAL LOW (ref 39.0–52.0)
HEMOGLOBIN: 12.4 g/dL — AB (ref 13.0–17.0)
Lymphocytes Relative: 25 % (ref 12–46)
Lymphs Abs: 2.2 10*3/uL (ref 0.7–4.0)
MCH: 30.2 pg (ref 26.0–34.0)
MCHC: 33.6 g/dL (ref 30.0–36.0)
MCV: 89.8 fL (ref 78.0–100.0)
MPV: 10.1 fL (ref 8.6–12.4)
Monocytes Absolute: 0.7 10*3/uL (ref 0.1–1.0)
Monocytes Relative: 8 % (ref 3–12)
NEUTROS ABS: 5.7 10*3/uL (ref 1.7–7.7)
NEUTROS PCT: 66 % (ref 43–77)
PLATELETS: 210 10*3/uL (ref 150–400)
RBC: 4.11 MIL/uL — AB (ref 4.22–5.81)
RDW: 15 % (ref 11.5–15.5)
WBC: 8.6 10*3/uL (ref 4.0–10.5)

## 2015-01-28 LAB — LIPID PANEL
CHOLESTEROL: 162 mg/dL (ref 125–200)
HDL: 22 mg/dL — ABNORMAL LOW (ref >=40)
LDL Cholesterol: 87 mg/dL (ref <130)
TRIGLYCERIDES: 265 mg/dL — AB (ref <150)
Total CHOL/HDL Ratio: 7.4 Ratio — ABNORMAL HIGH (ref <=5.0)
VLDL: 53 mg/dL — AB (ref <30)

## 2015-01-28 LAB — COMPLETE METABOLIC PANEL WITH GFR
ALBUMIN: 3.9 g/dL (ref 3.6–5.1)
ALK PHOS: 50 U/L (ref 40–115)
ALT: 8 U/L — ABNORMAL LOW (ref 9–46)
AST: 12 U/L (ref 10–35)
BILIRUBIN TOTAL: 0.3 mg/dL (ref 0.2–1.2)
BUN: 9 mg/dL (ref 7–25)
CALCIUM: 8.9 mg/dL (ref 8.6–10.3)
CO2: 32 mmol/L — ABNORMAL HIGH (ref 20–31)
Chloride: 100 mmol/L (ref 98–110)
Creat: 0.99 mg/dL (ref 0.70–1.33)
GFR, EST NON AFRICAN AMERICAN: 87 mL/min (ref >=60)
Glucose, Bld: 102 mg/dL — ABNORMAL HIGH (ref 70–99)
POTASSIUM: 3.9 mmol/L (ref 3.5–5.3)
SODIUM: 136 mmol/L (ref 135–146)
Total Protein: 7.1 g/dL (ref 6.1–8.1)

## 2015-01-28 NOTE — Op Note (Signed)
[name], [name] NO.:  [id number]  MEDICAL RECORD NO.:  [id number]  LOCATION:                               FACILITY:  [facility]  PHYSICIAN:  Lanelle Bal, MD    DATE OF BIRTH:  02-25-1961  DATE OF PROCEDURE:  01/26/2015 DATE OF DISCHARGE:  01/26/2015                              OPERATIVE REPORT   PREOPERATIVE DIAGNOSIS:  Right hilar lung mass with mediastinal adenopathy.  POSTOPERATIVE DIAGNOSIS:  Right hilar lung mass with mediastinal adenopathy.  Quick stain suggests non-small cell carcinoma of the lung.  PROCEDURES PERFORMED:  Bronchoscopy with EBUS and transbronchial biopsy and biopsy of right upper lobe bronchus.  SURGEON:  Lanelle Bal, MD.  BRIEF HISTORY:  The patient is a 54 year old male with known coronary occlusive disease who four years previously had coronary artery bypass grafting.  He presented with increasing cough.  Chest x-ray and CT scan demonstrated a large right hilar mass with evidence of significant mediastinal adenopathy and to obtain a tissue diagnosis, bronchoscopy and EBUS was recommended.  The patient agreed and signed informed consent.  DESCRIPTION OF PROCEDURE:  The patient underwent general endotracheal anesthesia without incident.  Appropriate time-out was performed.  A fiberoptic bronchoscope was then passed through the endotracheal tube, and the left tracheobronchial tree appeared clear of the lesions, and the right mainstem bronchus was clear at the takeoff.  At the right upper lobe bronchus, there was a somewhat fungating mass.  The bronchus intermedius and lower lobe bronchus were clear.  The scope was then removed, and an EBUS scope was placed and identified a #7 the lymph node.  To confirm tissue diagnosis in the nodal tissue, multiple passes of transbronchial using the EBUS scope were performed.  A quick smear on #7 node was consistent with non-small cell lung cancer.  The remaining tissue was submitted in  satellite for further diagnostic studies.  The scope was removed.  We then returned to the video bronchoscope through this brushings, and biopsies were obtained of the right upper lobe bronchus.  The patient tolerated procedure without difficulty.  He was extubated in the operating room and transferred to recovery room for further postoperative observation.     Lanelle Bal, MD     EG/MEDQ  D:  01/27/2015  T:  01/27/2015  Job:  [id number]

## 2015-01-28 NOTE — Telephone Encounter (Signed)
Called to discuss final path and recommendations for a port, in addition biopsy of pancreatic tail mass. No one home. Will try again later today, if no answer Monday am Donald Pore MD

## 2015-01-31 ENCOUNTER — Encounter (HOSPITAL_COMMUNITY): Payer: Self-pay | Admitting: Hematology & Oncology

## 2015-01-31 ENCOUNTER — Encounter (HOSPITAL_COMMUNITY): Payer: Self-pay | Admitting: *Deleted

## 2015-01-31 ENCOUNTER — Other Ambulatory Visit: Payer: Self-pay | Admitting: *Deleted

## 2015-01-31 ENCOUNTER — Encounter (HOSPITAL_COMMUNITY): Payer: 59 | Attending: Hematology & Oncology | Admitting: Hematology & Oncology

## 2015-01-31 VITALS — BP 139/78 | HR 86 | Temp 98.2°F | Resp 21 | Wt 220.2 lb

## 2015-01-31 DIAGNOSIS — K869 Disease of pancreas, unspecified: Secondary | ICD-10-CM | POA: Diagnosis not present

## 2015-01-31 DIAGNOSIS — C3491 Malignant neoplasm of unspecified part of right bronchus or lung: Secondary | ICD-10-CM | POA: Insufficient documentation

## 2015-01-31 DIAGNOSIS — C3490 Malignant neoplasm of unspecified part of unspecified bronchus or lung: Secondary | ICD-10-CM

## 2015-01-31 DIAGNOSIS — C3411 Malignant neoplasm of upper lobe, right bronchus or lung: Secondary | ICD-10-CM

## 2015-01-31 DIAGNOSIS — Z23 Encounter for immunization: Secondary | ICD-10-CM | POA: Diagnosis not present

## 2015-01-31 DIAGNOSIS — Z0000 Encounter for general adult medical examination without abnormal findings: Secondary | ICD-10-CM | POA: Insufficient documentation

## 2015-01-31 DIAGNOSIS — R918 Other nonspecific abnormal finding of lung field: Secondary | ICD-10-CM | POA: Insufficient documentation

## 2015-01-31 MED ORDER — INFLUENZA VAC SPLIT QUAD 0.5 ML IM SUSY
0.5000 mL | PREFILLED_SYRINGE | Freq: Once | INTRAMUSCULAR | Status: AC
  Administered 2015-01-31: 0.5 mL via INTRAMUSCULAR
  Filled 2015-01-31: qty 0.5

## 2015-01-31 NOTE — Progress Notes (Signed)
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 male is here on referral from the ED for lung cancer. He is here today with his wife, [name] [name] accompanied by two family members, his wife and daughter. The patient is here to review his recent PET/CT scans.   His wife notes that he is nervous about the aneurysm rupturing. His family notes that [name] lashes out and is frustrated about his disease.  [name] he has stopped drinking alcohol.  He previously had pancreatitis in the pancreatic tail region in 2007.  The patient is scheduled to have his port placed with Dr. GeServando Snareomorrow.  He is here today for additional discussion and treatment planning.   MEDICAL HISTORY:  Past Medical History  Diagnosis Date  . Coronary artery disease 2002    Lesion in LAD s/p angioplasty; s/p CABG x 4 Sept 2013  . Tobacco abuse   . [id number]  stents  . Hyperlipidemia 1999  . Essential hypertension, benign 1999  . Lung mass   . COPD (chronic obstructive pulmonary disease)  ([facility]  . Shortness of breath dyspnea     with exertion, sleeps with more pillows under head  . Arthritis   . Pancreatitis   . Complication of anesthesia     woke up while being extubated and remembers fighting  . Cancer ([facility]    lung  . Mass of pancreas     SURGICAL HISTORY: Past Surgical History  Procedure Laterality Date  . Knee surgery      left x 2  ACL repair  . Tee without cardioversion  01/02/2012    Procedure: TRANSESOPHAGEAL ECHOCARDIOGRAM (TEE);  Surgeon: EdGrace IsaacMD;  [facility] Service: Open [facility];  Laterality: N/A;  . Multiple extractions with alveoloplasty  02/21/2012    Procedure: MULTIPLE EXTRACION WITH ALVEOLOPLASTY;  Surgeon: RoLenn CalDDS;  Location[facility];  Service: Oral Surgery;  Laterality: N/A;  Extaction of tooth #'s 4,5,6,7,10,11,12,13,14,15,19,20,21,22,23,24,25,   . Left heart catheterization with coronary angiogram N/A 01/01/2012    Procedure: LEFT HEART CATHETERIZATION WITH CORONARY ANGIOGRAM;  Surgeon: ThHillary BowMD;  [facility];  Service: Cardiovascular;  Laterality: N/A;  . Coronary artery bypass graft  01/02/2012    Procedure: CORONARY ARTERY BYPASS GRAFTING (CABG);  Surgeon: EdGrace IsaacMD;  [place] Service: Open [place] Surgery;  Laterality: N/A;  times three using left internal mammary artery and right endoscopically harvested saphenous vein  . Coronary angioplasty  1999, 2002  . Video bronchoscopy with endobronchial ultrasound N/A 01/26/2015  Procedure: VIDEO BRONCHOSCOPY WITH ENDOBRONCHIAL ULTRASOUND;  Surgeon: [name], MD;  Location: [employer];  Service: Thoracic;  Laterality: N/A;  . Lung biopsy N/A 01/26/2015    Procedure: LUNG BIOPSY;  Surgeon: [name], MD;  Location: [place];  Service: Thoracic;  Laterality: N/A;  . Lymph node biopsy N/A 01/26/2015    Procedure: LYMPH NODE BIOPSY;  Surgeon: [name], MD;  Location: [place];  Service: Thoracic;  Laterality:  N/A;    SOCIAL HISTORY: Social History   Social History  . Marital Status: Married    Spouse Name: N/A  . Number of Children: 5  . Years of Education: N/A   Occupational History  . Works in [place]  . Smoking status: Current Every Day Smoker -- 1.00 packs/day for 35 years    Types: Cigarettes    Start date: 06/30/1976  . Smokeless tobacco: Never Used  . Alcohol Use: Yes     Comment: maybe a 12 pack/mth if that - very rare since 2013  . Drug Use: No  . Sexual Activity: Yes   Other Topics Concern  . Not on file   Social History Narrative   Darral Dash been married 17 years, with five kids.  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 Cancer Neg Hx   . Heart disease Father   . Diabetes Father   . Hyperlipidemia Father   . Hypertension Father   . Hyperlipidemia Mother   . Hypertension Mother   . Stroke Mother   . Diabetes Paternal Grandmother    indicated that his mother is alive. He indicated that his father is alive. He indicated that his sister is alive. He indicated that his brother is alive. He indicated that his maternal grandmother is deceased. He indicated that his maternal grandfather is deceased. He indicated that his paternal grandmother is deceased. He indicated that his paternal grandfather is deceased.   His father's somewhere in [place]; his mother lives here in [place] and works at [employer] 76 at the end of this month She's got high blood pressure, high cholesterol, no strokes that they know of; is on blood thinners Has  an aneurism at the base of the aorta where it goes into your stomach He knows his father's had high blood pressure and diabetes; Parents have been divorced since he was 3-4 Has half brothers / half sisters   ALLERGIES:  is allergic to lisinopril and tea.  MEDICATIONS:  Current Outpatient Prescriptions  Medication Sig Dispense Refill  . amLODipine (NORVASC) 5 MG tablet TAKE ONE (1) TABLET BY MOUTH EVERY DAY 90 tablet 6  . aspirin EC 81 MG tablet Take 81 mg by mouth daily.    . carvedilol (COREG) 6.25 MG tablet TAKE ONE TABLET BY MOUTH TWICE A DAY WITH A MEAL 180 tablet 3  . HYDROcodone-acetaminophen (NORCO) 10-325 MG tablet Take 1 tablet by mouth every 6 (six) hours as needed. 90 tablet 0  . pravastatin (PRAVACHOL) 40 MG  tablet TAKE ONE (1) TABLET BY MOUTH EVERY DAY 90 tablet 6  . ibuprofen (ADVIL,MOTRIN) 200 MG tablet Take 600 mg by mouth every 6 (six) hours as needed for moderate pain.      Current Facility-Administered Medications  Medication Dose Route Frequency Provider Last Rate Last Dose  . Influenza vac split quadrivalent PF (FLUARIX) injection 0.5 mL  0.5 mL Intramuscular Once [name], MD       Facility-Administered Medications Ordered in Other Visits  Medication Dose Route Frequency Provider Last Rate Last Dose  . fludeoxyglucose F - 18 (FDG) injection 10.91 milli Curie  10.91 milli Curie Intravenous Once PRN Medication Radiologist, MD   10.91 milli Curie at 01/27/15 1051    Review of Systems  Constitutional: Negative for fever, chills, weight loss and malaise/fatigue.  HENT: Negative for congestion, hearing loss, nosebleeds, sore throat and tinnitus.   Eyes: Negative for blurred vision, double vision, pain and discharge.  Respiratory: Positive for cough. Negative for hemoptysis, sputum production, shortness of breath and wheezing.   Cardiovascular: Negative for palpitations, claudication, leg swelling and PND.  Gastrointestinal: Negative for heartburn, nausea,  vomiting, abdominal pain, diarrhea, constipation, blood in stool and melena.  Genitourinary: Negative for dysuria, urgency, frequency and hematuria.  Musculoskeletal: Positive for joint pain. Negative for myalgias and falls.       Has had knee surgeries.  Skin: Negative for itching and rash.  Neurological: Negative for dizziness, tingling, tremors, sensory change, speech change, focal weakness, seizures, loss of consciousness, weakness and headaches.  Endo/Heme/Allergies: Does not bruise/bleed easily.  Psychiatric/Behavioral: Negative for depression, suicidal ideas, memory loss and substance abuse. The patient is not nervous/anxious and does not have insomnia.   All other systems reviewed and are negative.  14 point ROS was done and is otherwise as detailed above or in HPI   PHYSICAL EXAMINATION: ECOG PERFORMANCE STATUS: 1 - Symptomatic but completely ambulatory  Filed Vitals:   01/31/15 1555  BP: 139/78  Pulse: 86  Temp: 98.2 F (36.8 C)  Resp: 21   Filed Weights   01/31/15 1555  Weight: 220 lb 3.2 oz (99.882 kg)    Physical Exam  Constitutional: He is oriented to person, place, and time and well-developed, well-nourished, and in no distress.  HENT:  Head: Normocephalic and atraumatic.  Nose: Nose normal.  Mouth/Throat: Oropharynx is clear and moist. No oropharyngeal exudate.  Dentures on top and bottom.  Eyes: Conjunctivae and EOM are normal. Pupils are equal, round, and reactive to light. Right eye exhibits no discharge. Left eye exhibits no discharge. No scleral icterus.  Neck: Normal range of motion. Neck supple. No tracheal deviation present. No thyromegaly present.  Cardiovascular: Normal rate, regular rhythm and normal heart sounds.  Exam reveals no gallop and no friction rub.   No murmur heard. Pulmonary/Chest: Effort normal and breath sounds normal. He has no wheezes. He has no rales.  Abdominal: Soft. Bowel sounds are normal. He exhibits no distension and no mass.  There is no tenderness. There is no rebound and no guarding.  Musculoskeletal: Normal range of motion. He exhibits no edema.  Lymphadenopathy:    He has no cervical adenopathy.  Neurological: He is alert and oriented to person, place, and time. He has normal reflexes. No cranial nerve deficit. Gait normal. Coordination normal.  Skin: Skin is warm and dry. No rash noted.  Psychiatric: Mood, memory, affect and judgment normal.  Nursing note and vitals reviewed.   LABORATORY DATA:  I have reviewed the data  as listed  Results for MASOUD, NYCE (MRN [id number])   Ref. Range 01/18/2015 11:20  Sodium Latest Ref Range: 135-145 mmol/L 134 (L)  Potassium Latest Ref Range: 3.5-5.1 mmol/L 4.1  Chloride Latest Ref Range: 101-111 mmol/L 99 (L)  CO2 Latest Ref Range: 22-32 mmol/L 27  BUN Latest Ref Range: 6-20 mg/dL 8  Creatinine Latest Ref Range: 0.61-1.24 mg/dL 0.99  Calcium Latest Ref Range: 8.9-10.3 mg/dL 8.3 (L)  EGFR (Non-African Amer.) Latest Ref Range: >60 mL/min >60  EGFR (African American) Latest Ref Range: >60 mL/min >60  Glucose Latest Ref Range: 65-99 mg/dL 134 (H)  Anion gap Latest Ref Range: 5-15  8  WBC Latest Ref Range: 4.0-10.5 K/uL 8.9  RBC Latest Ref Range: 4.22-5.81 MIL/uL 4.51  Hemoglobin Latest Ref Range: 13.0-17.0 g/dL 13.7  HCT Latest Ref Range: 39.0-52.0 % 40.3  MCV Latest Ref Range: 78.0-100.0 fL 89.4  MCH Latest Ref Range: 26.0-34.0 pg 30.4  MCHC Latest Ref Range: 30.0-36.0 g/dL 34.0  RDW Latest Ref Range: 11.5-15.5 % 13.7  Platelets Latest Ref Range: 150-400 K/uL 196  Neutrophils Latest Units: % 68  Lymphocytes Latest Units: % 22  Monocytes Relative Latest Units: % 7  Eosinophil Latest Units: % 3  Basophil Latest Units: % 0  NEUT# Latest Ref Range: 1.7-7.7 K/uL 6.0  Lymphocyte # Latest Ref Range: 0.7-4.0 K/uL 2.0  Monocyte # Latest Ref Range: 0.1-1.0 K/uL 0.6  Eosinophils Absolute Latest Ref Range: 0.0-0.7 K/uL 0.3  Basophils Absolute Latest Ref Range: 0.0-0.1  K/uL 0.0    RADIOGRAPHIC STUDIES: I have personally reviewed the radiological images as listed and agreed with the findings in the report.  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  EXAM: NUCLEAR MEDICINE PET SKULL BASE TO THIGH  TECHNIQUE: 10.9 mCi F-18 FDG was injected intravenously. Full-ring PET imaging was performed from the skull base to thigh after the radiotracer. CT data was obtained and used for attenuation correction and anatomic localization.  FASTING BLOOD GLUCOSE: Value: 98 mg/dl  COMPARISON: 01/18/2015  FINDINGS: NECK  Bilateral tonsillar pillar activity appears symmetric and probably physiologic.  CHEST  Right hilar mass maximum standard uptake value 13.0. Similar appearance to prior with regard to occlusion of the right upper lobe tracheobronchial tree and infrahilar and mediastinal extension. Peribronchovascular nodularity in the right upper lobe demonstrates low-level activity, maximum standard uptake value approximately 2.6, possibly from malignancy or postobstructive pneumonitis. Underlying emphysema. Peripherally increased signal along the increasing region of consolidation in the peripheral right upper lobe on image 77 series 4, maximum standard uptake value 3.8.  ABDOMEN/PELVIS  Likely physiologic activity along the cardia of the stomach. Previously described mass in the pancreatic tail has a maximum standard uptake value 12.6. This appears separate from the left adrenal gland. No hypermetabolic liver lesions.  Infrarenal abdominal aortic aneurysm measuring 5.8 cm in diameter on image 143 series 4, fusiform and with peripheral atherosclerosis. High activity along the right T12 transverse process as along the articulation with the rib and probably degenerative.  SKELETON  No focal hypermetabolic activity to suggest skeletal metastasis.  IMPRESSION: 1. Large hypermetabolic right  hilar mass with mediastinal invasion and infrahilar extension, maximum standard uptake value 13.0. 2. Patchy opacities with increased activity in the apical segment right upper lobe and especially peripherally along the posterior inferior margin of the right upper lobe, potentially from tumor extension or postobstructive pneumonitis. 3. Pancreatic tail mass, approximately 4.5 cm in diameter, could be a metastatic lesion or a primary pancreatic  adenocarcinoma. No liver metastatic lesion identified. 4. Abdominal aortic aneurysm, 5.8 cm in diameter. Vascular surgery consultation recommended due to increased risk of rupture for AAA >5.5 cm. This recommendation follows ACR consensus guidelines: White Paper of the ACR Incidental Findings Committee II on Vascular Findings. J Am Coll Radiol 2013; 10:789-794.   Electronically Signed  By: Van Clines M.D.  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 Large hypermetabolic right hilar mass with mediastinal invasion and infrahilar extension,   Biopsy has been performed and histology shows a SCLC. We spent time today in discussion regarding prognosis of small cell both limited and extensive stage disease.    I reviewed his PET/CT in detail. I am not convinced at this point that the findings in the pancreatic tail are definitely small cell. 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 I have  recommended biopsy of the lesion. IR is unable to do, therefore I have contacted Dr. JaArdis Hughsn [place]SAub[place] HillsI advised the patient that this needs to be resolved as this will guide treatment decisions.  Dr. GeVerlene Mayerill be placing his port.  I emphasized the importance of tobacco cessation.   I will see him back post biopsy, with plans to initiate chemotherapy next week. If the final pancreatic biopsy is negative for SCLC, we will refer to Radiation. If possible for SCLC then we will proceed with chemotherapy only.  If the pathology reveals something else that must be treated, we will try to address this progressing forward. The patient also has an appointment with vascular surgery given his AAA.  All questions were answered. The patient knows to call the clinic with any problems, questions or concerns.  This document serves as a record of services personally performed by ShAncil LinseyMD. It was created on her behalf by ElArlyce Harmana trained medical scribe. The creation of this record is based on the scribe's personal observations and the provider's statements to them. This document has been checked and approved by the attending provider.  I have reviewed the above documentation for accuracy and completeness, and I agree with the above.  This note was electronically signed.    PeMolli HazardMD  01/31/2015 4:23 PM

## 2015-01-31 NOTE — Patient Instructions (Addendum)
[name] at [facility] Discharge Instructions  RECOMMENDATIONS MADE BY THE CONSULTANT AND ANY TEST RESULTS WILL BE SENT TO YOUR REFERRING PHYSICIAN.  Discussion by Dr. Whitney Muse. Flu Vaccine today. Will get you scheduled for biopsy Will get you scheduled for chemotherapy teaching and would like to get you started on treatment next week. Will call you with dates and times for all appointments.  Follow-up to be determined.  Thank you for choosing [place] at [facility] to provide your oncology and hematology care.  To afford each patient quality time with our provider, please arrive at least 15 minutes before your scheduled appointment time.    You need to re-schedule your appointment should you arrive 10 or more minutes late.  We strive to give you quality time with our providers, and arriving late affects you and other patients whose appointments are after yours.  Also, if you no show three or more times for appointments you may be dismissed from the clinic at the providers discretion.     Again, thank you for choosing [facility].  Our hope is that these requests will decrease the amount of time that you wait before being seen by our physicians.       _____________________________________________________________  Should you have questions after your visit to [facility], please contact our office at [phone] between the hours of 8:30 a.m. and 4:30 p.m.  Voicemails left after 4:30 p.m. will not be returned until the following business day.  For prescription refill requests, have your pharmacy contact our office.

## 2015-01-31 NOTE — Progress Notes (Signed)
   01/31/15 1931  OBSTRUCTIVE SLEEP APNEA  Have you ever been diagnosed with sleep apnea through a sleep study? No  Do you snore loudly (loud enough to be heard through closed doors)?  1  Do you often feel tired, fatigued, or sleepy during the daytime (such as falling asleep during driving or talking to someone)? 0  Has anyone observed you stop breathing during your sleep? 0  Do you have, or are you being treated for high blood pressure? 1  BMI more than 35 kg/m2? 0  Age > 50 (1-yes) 1  Neck circumference greater than:Male 16 inches or larger, Male 17inches or larger? 1  Male Gender (Yes=1) 1  Obstructive Sleep Apnea Score 5

## 2015-01-31 NOTE — Progress Notes (Signed)
[name] presents today for injection per MD orders. Flu Vaccine administered IM in left deltoid. Administration without incident. Patient tolerated well.

## 2015-02-01 ENCOUNTER — Ambulatory Visit (HOSPITAL_COMMUNITY): Payer: 59 | Admitting: Anesthesiology

## 2015-02-01 ENCOUNTER — Ambulatory Visit (HOSPITAL_COMMUNITY): Payer: Self-pay | Admitting: Hematology & Oncology

## 2015-02-01 ENCOUNTER — Telehealth: Payer: Self-pay

## 2015-02-01 ENCOUNTER — Encounter (HOSPITAL_COMMUNITY)
Admission: RE | Disposition: A | Discharge status: Home / Self Care | Payer: Self-pay | Source: Ambulatory Visit | Attending: Cardiothoracic Surgery

## 2015-02-01 ENCOUNTER — Other Ambulatory Visit (HOSPITAL_COMMUNITY): Payer: Self-pay | Admitting: Oncology

## 2015-02-01 ENCOUNTER — Ambulatory Visit (HOSPITAL_COMMUNITY): Payer: 59

## 2015-02-01 ENCOUNTER — Ambulatory Visit (HOSPITAL_COMMUNITY)
Admission: RE | Admit: 2015-02-01 | Discharge: 2015-02-01 | Disposition: A | Discharge status: Home / Self Care | Payer: 59 | Source: Ambulatory Visit | Attending: Cardiothoracic Surgery | Admitting: Cardiothoracic Surgery

## 2015-02-01 DIAGNOSIS — C3490 Malignant neoplasm of unspecified part of unspecified bronchus or lung: Secondary | ICD-10-CM

## 2015-02-01 DIAGNOSIS — I2510 Atherosclerotic heart disease of native coronary artery without angina pectoris: Secondary | ICD-10-CM | POA: Diagnosis not present

## 2015-02-01 DIAGNOSIS — I10 Essential (primary) hypertension: Secondary | ICD-10-CM | POA: Diagnosis not present

## 2015-02-01 DIAGNOSIS — Z951 Presence of aortocoronary bypass graft: Secondary | ICD-10-CM | POA: Insufficient documentation

## 2015-02-01 DIAGNOSIS — J449 Chronic obstructive pulmonary disease, unspecified: Secondary | ICD-10-CM | POA: Insufficient documentation

## 2015-02-01 DIAGNOSIS — Z09 Encounter for follow-up examination after completed treatment for conditions other than malignant neoplasm: Secondary | ICD-10-CM

## 2015-02-01 DIAGNOSIS — Z79899 Other long term (current) drug therapy: Secondary | ICD-10-CM | POA: Insufficient documentation

## 2015-02-01 DIAGNOSIS — C3401 Malignant neoplasm of right main bronchus: Secondary | ICD-10-CM | POA: Insufficient documentation

## 2015-02-01 DIAGNOSIS — Z6831 Body mass index (BMI) 31.0-31.9, adult: Secondary | ICD-10-CM | POA: Diagnosis not present

## 2015-02-01 DIAGNOSIS — Z955 Presence of coronary angioplasty implant and graft: Secondary | ICD-10-CM | POA: Diagnosis not present

## 2015-02-01 DIAGNOSIS — I714 Abdominal aortic aneurysm, without rupture: Secondary | ICD-10-CM

## 2015-02-01 DIAGNOSIS — R918 Other nonspecific abnormal finding of lung field: Secondary | ICD-10-CM

## 2015-02-01 DIAGNOSIS — Z791 Long term (current) use of non-steroidal anti-inflammatories (NSAID): Secondary | ICD-10-CM | POA: Insufficient documentation

## 2015-02-01 DIAGNOSIS — Z7982 Long term (current) use of aspirin: Secondary | ICD-10-CM | POA: Insufficient documentation

## 2015-02-01 DIAGNOSIS — F17210 Nicotine dependence, cigarettes, uncomplicated: Secondary | ICD-10-CM | POA: Insufficient documentation

## 2015-02-01 HISTORY — PX: PORTACATH PLACEMENT: SHX2246

## 2015-02-01 LAB — CBC
HCT: 39.2 % (ref 39.0–52.0)
Hemoglobin: 13.2 g/dL (ref 13.0–17.0)
MCH: 30.1 pg (ref 26.0–34.0)
MCHC: 33.7 g/dL (ref 30.0–36.0)
MCV: 89.5 fL (ref 78.0–100.0)
Platelets: 204 10*3/uL (ref 150–400)
RBC: 4.38 MIL/uL (ref 4.22–5.81)
RDW: 13.6 % (ref 11.5–15.5)
WBC: 7.6 10*3/uL (ref 4.0–10.5)

## 2015-02-01 LAB — COMPREHENSIVE METABOLIC PANEL
ALT: 12 U/L — ABNORMAL LOW (ref 17–63)
AST: 16 U/L (ref 15–41)
Albumin: 3.7 g/dL (ref 3.5–5.0)
Alkaline Phosphatase: 55 U/L (ref 38–126)
Anion gap: 11 (ref 5–15)
BUN: 7 mg/dL (ref 6–20)
CO2: 26 mmol/L (ref 22–32)
Calcium: 8.9 mg/dL (ref 8.9–10.3)
Chloride: 96 mmol/L — ABNORMAL LOW (ref 101–111)
Creatinine, Ser: 0.97 mg/dL (ref 0.61–1.24)
GFR calc Af Amer: >60 mL/min (ref >60)
GFR calc non Af Amer: >60 mL/min (ref >60)
Glucose, Bld: 106 mg/dL — ABNORMAL HIGH (ref 65–99)
Potassium: 4.3 mmol/L (ref 3.5–5.1)
Sodium: 133 mmol/L — ABNORMAL LOW (ref 135–145)
Total Bilirubin: 0.7 mg/dL (ref 0.3–1.2)
Total Protein: 7.3 g/dL (ref 6.5–8.1)

## 2015-02-01 LAB — APTT: aPTT: 30 seconds (ref 24–37)

## 2015-02-01 LAB — PROTIME-INR
INR: 1.04 (ref 0.00–1.49)
Prothrombin Time: 13.8 seconds (ref 11.6–15.2)

## 2015-02-01 SURGERY — INSERTION, TUNNELED CENTRAL VENOUS DEVICE, WITH PORT
Anesthesia: Monitor Anesthesia Care | Site: Chest | Laterality: Left

## 2015-02-01 MED ORDER — SODIUM CHLORIDE 0.9 % IR SOLN
Status: DC | PRN
  Administered 2015-02-01: 1000 mL

## 2015-02-01 MED ORDER — MIDAZOLAM HCL 2 MG/2ML IJ SOLN
INTRAMUSCULAR | Status: AC
  Filled 2015-02-01: qty 4

## 2015-02-01 MED ORDER — HEPARIN SOD (PORK) LOCK FLUSH 100 UNIT/ML IV SOLN
INTRAVENOUS | Status: AC
  Filled 2015-02-01: qty 5

## 2015-02-01 MED ORDER — CEFUROXIME SODIUM 1.5 G IJ SOLR
1.5000 g | INTRAMUSCULAR | Status: AC
  Administered 2015-02-01: 1.5 g via INTRAVENOUS
  Filled 2015-02-01: qty 1.5

## 2015-02-01 MED ORDER — LIDOCAINE HCL (PF) 1 % IJ SOLN
INTRAMUSCULAR | Status: DC | PRN
  Administered 2015-02-01: 13 mL via INTRADERMAL

## 2015-02-01 MED ORDER — HEPARIN SOD (PORK) LOCK FLUSH 100 UNIT/ML IV SOLN: INTRAVENOUS | Status: DC | PRN

## 2015-02-01 MED ORDER — MIDAZOLAM HCL 2 MG/2ML IJ SOLN: 0.5000 mg | Freq: Once | INTRAMUSCULAR | Status: DC | PRN

## 2015-02-01 MED ORDER — LIDOCAINE HCL (PF) 1 % IJ SOLN
INTRAMUSCULAR | Status: AC
  Filled 2015-02-01: qty 30

## 2015-02-01 MED ORDER — FENTANYL CITRATE (PF) 250 MCG/5ML IJ SOLN
INTRAMUSCULAR | Status: AC
  Filled 2015-02-01: qty 5

## 2015-02-01 MED ORDER — LACTATED RINGERS IV SOLN
INTRAVENOUS | Status: DC
  Administered 2015-02-01 (×2): via INTRAVENOUS

## 2015-02-01 MED ORDER — PROPOFOL 10 MG/ML IV BOLUS
INTRAVENOUS | Status: AC
  Filled 2015-02-01: qty 20

## 2015-02-01 MED ORDER — SODIUM CHLORIDE 0.9 % IV SOLN
INTRAVENOUS | Status: DC | PRN
  Administered 2015-02-01: 500 mL

## 2015-02-01 MED ORDER — MEPERIDINE HCL 25 MG/ML IJ SOLN: 6.2500 mg | INTRAMUSCULAR | Status: DC | PRN

## 2015-02-01 MED ORDER — FENTANYL CITRATE (PF) 100 MCG/2ML IJ SOLN
INTRAMUSCULAR | Status: DC | PRN
Start: 2015-02-01 — End: 2015-02-02
  Administered 2015-02-01: 50 ug via INTRAVENOUS

## 2015-02-01 MED ORDER — PROMETHAZINE HCL 25 MG/ML IJ SOLN: 6.2500 mg | INTRAMUSCULAR | Status: DC | PRN

## 2015-02-01 MED ORDER — PROPOFOL 500 MG/50ML IV EMUL
INTRAVENOUS | Status: DC | PRN
  Administered 2015-02-01: 50 ug/kg/min via INTRAVENOUS

## 2015-02-01 MED ORDER — FENTANYL CITRATE (PF) 100 MCG/2ML IJ SOLN: 25.0000 ug | INTRAMUSCULAR | Status: DC | PRN

## 2015-02-01 MED ORDER — MIDAZOLAM HCL 5 MG/5ML IJ SOLN
INTRAMUSCULAR | Status: DC | PRN
  Administered 2015-02-01 (×2): 1 mg via INTRAVENOUS

## 2015-02-01 SURGICAL SUPPLY — 40 items
BAG DECANTER FOR FLEXI CONT (MISCELLANEOUS) ×2 IMPLANT
BLADE SURG 11 STRL SS (BLADE) ×2 IMPLANT
CANISTER SUCTION 2500CC (MISCELLANEOUS) ×2 IMPLANT
COVER PROBE W GEL 5X96 (DRAPES) ×2 IMPLANT
COVER SURGICAL LIGHT HANDLE (MISCELLANEOUS) ×2 IMPLANT
DERMABOND ADVANCED (GAUZE/BANDAGES/DRESSINGS) ×1
DERMABOND ADVANCED .7 DNX12 (GAUZE/BANDAGES/DRESSINGS) ×1 IMPLANT
DRAPE C-ARM 42X72 X-RAY (DRAPES) ×2 IMPLANT
DRAPE CHEST BREAST 15X10 FENES (DRAPES) ×2 IMPLANT
ELECT CAUTERY BLADE 6.4 (BLADE) ×2 IMPLANT
ELECT REM PT RETURN 9FT ADLT (ELECTROSURGICAL) ×2
ELECTRODE REM PT RTRN 9FT ADLT (ELECTROSURGICAL) ×1 IMPLANT
GAUZE SPONGE 4X4 12PLY STRL (GAUZE/BANDAGES/DRESSINGS) ×2 IMPLANT
GLOVE BIO SURGEON STRL SZ 6.5 (GLOVE) ×2 IMPLANT
GLOVE BIOGEL PI IND STRL 6.5 (GLOVE) ×3 IMPLANT
GLOVE BIOGEL PI INDICATOR 6.5 (GLOVE) ×3
GLOVE ECLIPSE 6.5 STRL STRAW (GLOVE) ×2 IMPLANT
GOWN STRL REUS W/ TWL LRG LVL3 (GOWN DISPOSABLE) ×3 IMPLANT
GOWN STRL REUS W/TWL LRG LVL3 (GOWN DISPOSABLE) ×3
GUIDEWIRE UNCOATED ST S 7038 (WIRE) IMPLANT
INTRODUCER 13FR (MISCELLANEOUS) IMPLANT
INTRODUCER COOK 11FR (CATHETERS) IMPLANT
KIT BASIN OR (CUSTOM PROCEDURE TRAY) ×2 IMPLANT
KIT POWER CATH 8FR (Catheter) IMPLANT
KIT ROOM TURNOVER OR (KITS) ×2 IMPLANT
NEEDLE 22X1 1/2 (OR ONLY) (NEEDLE) ×2 IMPLANT
NEEDLE HYPO 25GX1X1/2 BEV (NEEDLE) ×2 IMPLANT
NS IRRIG 1000ML POUR BTL (IV SOLUTION) ×2 IMPLANT
PACK GENERAL/GYN (CUSTOM PROCEDURE TRAY) ×2 IMPLANT
PAD ARMBOARD 7.5X6 YLW CONV (MISCELLANEOUS) ×4 IMPLANT
SET SHEATH INTRODUCER 10FR (MISCELLANEOUS) IMPLANT
SUT SILK 2 0 SH (SUTURE) ×2 IMPLANT
SUT VIC AB 3-0 SH 18 (SUTURE) ×2 IMPLANT
SUT VICRYL 4-0 PS2 18IN ABS (SUTURE) ×2 IMPLANT
SYR 20CC LL (SYRINGE) ×2 IMPLANT
SYR 5ML LUER SLIP (SYRINGE) ×2 IMPLANT
SYR CONTROL 10ML LL (SYRINGE) ×2 IMPLANT
TOWEL OR 17X24 6PK STRL BLUE (TOWEL DISPOSABLE) ×2 IMPLANT
TOWEL OR 17X26 10 PK STRL BLUE (TOWEL DISPOSABLE) ×2 IMPLANT
WATER STERILE IRR 1000ML POUR (IV SOLUTION) ×2 IMPLANT

## 2015-02-01 NOTE — Transfer of Care (Signed)
Immediate Anesthesia Transfer of Care Note  Patient: [name]  Procedure(s) Performed: Procedure(s): ATTEMPTED INSERTION OF PORT-A-CATH (Left)  Patient Location: PACU  Anesthesia Type:MAC  Level of Consciousness: awake, alert  and oriented  Airway & Oxygen Therapy: Patient Spontanous Breathing  Post-op Assessment: Report given to RN, Post -op Vital signs reviewed and stable and Patient moving all extremities X 4  Post vital signs: Reviewed and stable  Last Vitals:  Filed Vitals:   02/01/15 0952  BP: 116/83  Pulse: 88  Temp: 36.6 C  Resp: 20    Complications: No apparent anesthesia complications

## 2015-02-01 NOTE — Anesthesia Postprocedure Evaluation (Signed)
  Anesthesia Post-op Note  Patient: [name]  Procedure(s) Performed: Procedure(s): ATTEMPTED INSERTION OF PORT-A-CATH (Left)  Patient Location: PACU  Anesthesia Type: MAC  Level of Consciousness: awake and alert   Airway and Oxygen Therapy: Patient Spontanous Breathing  Post-op Pain: Controlled  Post-op Assessment: Post-op Vital signs reviewed, Patient's Cardiovascular Status Stable and Respiratory Function Stable  Post-op Vital Signs: Reviewed  Filed Vitals:   02/01/15 1415  BP: 119/67  Pulse:   Temp:   Resp:     Complications: No apparent anesthesia complications

## 2015-02-01 NOTE — H&P (Addendum)
[name] Medical Record #[id number] Date of Birth: 1960-05-25  Referring: Dr Whitney Muse Primary Care: [name], MD  Chief Complaint:   Lung Mass  History of Present Illness:    [name] 54 y.o. male has  seen  for newly discovered right lung mass. The patient was previously seen in September 2013 at which time he underwent urgent coronary artery bypass grafting. He now returns with a newly discovered right lung mass. He noted pain in his right ribs several days ago and went to the [facility] emergency room chest x-ray showed a right hilar mass this was confirmed by CT of the chest. The patient is referred for further evaluation for radiographic evidence of primary lung cancer. The patient has been a long-term smoker and previously used alcohol heavily. He stopped smoking and consuming alcohol found his coronary artery bypass grafting. He continues to avoid alcohol, but after his wife was involved in a motor vehicle accident he resumed smoking.   He denies any recent weight loss or gain, has had cough but no hemoptysis. He specifically denies any abdominal discomfort. He denies any notable facial or neck swelling suggestive of superior vena cava syndrome.   Last week bronch EBUS was done and confirmed small cell lung cancer. PET scan showed a 5.7 AAA. He was seen today by Dr Trula Slade to evaluate this.  Patient presents today for placement of portacath    Current Activity/ Functional Status:  Patient is independent with mobility/ambulation, transfers, ADL's, IADL's.   Zubrod Score: At the time of surgery this patient's most appropriate activity status/level should be described as: '[]'$     0    Normal activity, no symptoms '[x]'$     1    Restricted in physical strenuous activity but ambulatory, able to do out light work '[]'$     2    Ambulatory and capable of self  care, unable to do work activities, up and about               >50 % of waking hours                              '[]'$     3    Only limited self care, in bed greater than 50% of waking hours '[]'$     4    Completely disabled, no self care, confined to bed or chair '[]'$     5    Moribund   Past Medical History  Diagnosis Date  . Coronary artery disease 2002    Lesion in LAD s/p angioplasty; s/p CABG x 4 Sept 2013  . Tobacco abuse   . Myocardial infarct [facility]) [id number]    stents  . Hyperlipidemia 1999  . Essential hypertension, benign 1999  . Lung mass   . COPD (chronic obstructive pulmonary disease) ([place])   . Shortness of breath dyspnea     with exertion, sleeps with more pillows under head  . Arthritis   . Pancreatitis   . Complication of anesthesia     woke up while being extubated and remembers fighting  . Cancer ([place])     lung  . Mass of  pancreas     Past Surgical History  Procedure Laterality Date  . Knee surgery      left x 2  ACL repair  . Tee without cardioversion  01/02/2012    Procedure: TRANSESOPHAGEAL ECHOCARDIOGRAM (TEE);  Surgeon: [name], MD;  Location: [facility];  Service: Open Heart Surgery;  Laterality: N/A;  . Multiple extractions with alveoloplasty  02/21/2012    Procedure: MULTIPLE EXTRACION WITH ALVEOLOPLASTY;  Surgeon: Lenn Cal, DDS;  Location: [facility];  Service: Oral Surgery;  Laterality: N/A;  Extaction of tooth #'s 4,5,6,7,10,11,12,13,14,15,19,20,21,22,23,24,25,   . Left heart catheterization with coronary angiogram N/A 01/01/2012    Procedure: LEFT HEART CATHETERIZATION WITH CORONARY ANGIOGRAM;  Surgeon: Hillary Bow, MD;  Location: [facility] CATH LAB;  Service: Cardiovascular;  Laterality: N/A;  . Coronary artery bypass graft  01/02/2012    Procedure: CORONARY ARTERY BYPASS GRAFTING (CABG);  Surgeon: [name], MD;  Location: [place];  Service: Open Heart Surgery;  Laterality: N/A;  times three using left internal mammary artery and right  endoscopically harvested saphenous vein  . Coronary angioplasty  1999, 2002  . Video bronchoscopy with endobronchial ultrasound N/A 01/26/2015    Procedure: VIDEO BRONCHOSCOPY WITH ENDOBRONCHIAL ULTRASOUND;  Surgeon: [name], MD;  Location: [place];  Service: Thoracic;  Laterality: N/A;  . Lung biopsy N/A 01/26/2015    Procedure: LUNG BIOPSY;  Surgeon: [name], MD;  Location: [place];  Service: Thoracic;  Laterality: N/A;  . Lymph node biopsy N/A 01/26/2015    Procedure: LYMPH NODE BIOPSY;  Surgeon: [name], MD;  Location: [place];  Service: Thoracic;  Laterality: N/A;    Family History  Problem Relation Age of Onset  . Cancer Neg Hx   . Heart disease Father   . Diabetes Father   . Hyperlipidemia Father   . Hypertension Father   . Hyperlipidemia Mother   . Hypertension Mother   . Stroke Mother   . Diabetes Paternal Grandmother     Social History   Social History  . Marital Status: Married    Spouse Name: N/A  . Number of Children: 5  . Years of Education: N/A   Occupational History  . Works in [employer]  . Smoking status: Current Every Day Smoker -- 1.00 packs/day for 35 years    Types: Cigarettes    Start date: 06/30/1976  . Smokeless tobacco: Never Used  . Alcohol Use: Yes     Comment: maybe a 12 pack/mth if that - very rare since 2013  . Drug Use: No  . Sexual Activity: Yes   Other Topics Concern  . Not on file   Social History Narrative    History  Smoking status  . Current Every Day Smoker -- 1.00 packs/day for 35 years  . Types: Cigarettes  . Start date: 06/30/1976  Smokeless tobacco  . Never Used    History  Alcohol Use  . Yes    Comment: maybe a 12 pack/mth if that - very rare since 2013     Allergies  Allergen Reactions  . Lisinopril Cough  . Tea Hives    Current Facility-Administered Medications  Medication Dose Route Frequency Provider Last Rate Last Dose  . cefUROXime (ZINACEF) 1.5 g  in dextrose 5 % 50 mL IVPB  1.5 g Intravenous To SS-Surg [name], MD      . lactated ringers infusion   Intravenous Continuous Lilia Argue  Servando Snare, MD 50 mL/hr at 02/01/15 1011     Facility-Administered Medications Ordered in Other Encounters  Medication Dose Route Frequency Provider Last Rate Last Dose  . fludeoxyglucose F - 18 (FDG) injection 10.91 milli Curie  10.91 milli Curie Intravenous Once PRN Medication Radiologist, MD   10.91 milli Curie at 01/27/15 1051      Review of Systems:     Cardiac Review of Systems: Y or N  Chest Pain [ y   ]  Resting SOB [ n  ] Exertional SOB  [ y ]  Vertell Limber Florencio.Farrier  ]   Pedal Edema [ n  ]    Palpitations [n  ] Syncope  [n  ]   Presyncope [n   ]  General Review of Systems: [Y] = yes [  ]=no Constitional: recent weight change [  ];  Wt loss over the last 3 months [   ] anorexia [  ]; fatigue [  ]; nausea [  ]; night sweats [  ]; fever [  ]; or chills [  ];          Dental: poor dentition[  ]; Last Dentist visit:   Eye : blurred vision [  ]; diplopia [   ]; vision changes [  ];  Amaurosis fugax[  ]; Resp: cough [ y ];  wheezing[ yy ];  hemoptysis[ n ]; shortness of breath[ n ]; paroxysmal nocturnal dyspnea[y  ]; dyspnea on exertion[  ]; or orthopnea[  ];  GI:  gallstones[  ], vomiting[  ];  dysphagia[  ]; melena[  ];  hematochezia [  ]; heartburn[  ];   Hx of  Colonoscopy[ n ]; GU: kidney stones [  ]; hematuria[  ];   dysuria [  ];  nocturia[  ];  history of     obstruction [  ]; urinary frequency [  ]             Skin: rash, swelling[  ];, hair loss[  ];  peripheral edema[  ];  or itching[  ]; Musculosketetal: myalgias[  ];  joint swelling[  ];  joint erythema[  ];  joint pain[  ];  back pain[  ];  Heme/Lymph: bruising[  ];  bleeding[  ];  anemia[  ];  Neuro: TIA[  ];  headaches[n  ];  stroke[  ];  vertigo[n  ];  seizures[n  ];   paresthesias[  ];  difficulty walking[n  ];  Psych:depression[  ]; anxiety[  ];  Endocrine: diabetes[  ];  thyroid  dysfunction[  ];  Immunizations: Flu up to date [ n ]; Pneumococcal up to date Florencio.Farrier  ];  Other:  Physical Exam: BP 116/83 mmHg  Pulse 88  Temp(Src) 97.8 F (36.6 C) (Oral)  Resp 20  Ht '5\' 10"'$  (1.778 m)  Wt 220 lb 3.2 oz (99.882 kg)  BMI 31.60 kg/m2  SpO2 99%  PHYSICAL EXAMINATION: General appearance: alert, cooperative, appears older than stated age and no distress Head: Normocephalic, without obvious abnormality, atraumatic, right External juglar vein notable but no facial swelling Neck: no adenopathy, no carotid bruit,  Mild JVD right neck, supple, symmetrical, trachea midline and thyroid not enlarged, symmetric, no tenderness/mass/nodules Lymph nodes: Cervical, supraclavicular, and axillary nodes normal. Resp: diminished breath sounds bibasilar and rhonchi bibasilar Back: symmetric, no curvature. ROM normal. No CVA tenderness. Cardio: regular rate and rhythm, S1, S2 normal, no murmur, click, rub or gallop GI: soft, non-tender; bowel sounds normal; no masses,  no  organomegaly Extremities: extremities normal, atraumatic, no cyanosis or edema and Homans sign is negative, no sign of DVT Neurologic: Grossly normal  Diagnostic Studies & Laboratory data:     Recent Radiology Findings:  Dg Chest 2 View  02/01/2015   CLINICAL DATA:  Preoperative Port-A-Cath insertion. Small cell lung carcinoma  EXAM: CHEST  2 VIEW  COMPARISON:  Chest radiograph January 24, 2015; PET-CT January 27, 2015  FINDINGS: The large mass arising from the right hilar region is again noted measuring approximately 8 x 8 x 7 cm. There is surrounding patchy opacity, likely representing partially obstructing pneumonitis. No new opacity is identified. Left lung is clear. The heart size is normal. Pulmonary vascularity on the left is normal. There is distortion of pulmonary vascularity on the right, stable. Patient is status post coronary artery bypass grafting. No bone lesions are appreciable.  IMPRESSION: Stable extensive mass  with surrounding pneumonitis on the right, not significantly changed from recent prior studies. No new opacity. Cardiac silhouette within normal limits.   Electronically Signed   By: Lowella Grip III M.D.   On: 02/01/2015 09:53   Mr Jeri Cos FX Contrast  01/21/2015   CLINICAL DATA:  Lung cancer staging. No neurologic symptoms are reported. Initial encounter.  EXAM: MRI HEAD WITHOUT AND WITH CONTRAST  TECHNIQUE: Multiplanar, multiecho pulse sequences of the brain and surrounding structures were obtained without and with intravenous contrast.  CONTRAST:  MultiHance 20 mL.  COMPARISON:  06/29/2010.  FINDINGS: No evidence for acute infarction, hemorrhage, mass lesion, hydrocephalus, or extra-axial fluid. Go normal for age cerebral volume. Minor white matter disease, nonspecific and stable.  Pituitary, pineal, and cerebellar tonsils unremarkable. No upper cervical lesions.  Flow voids are maintained throughout the carotid, basilar, and vertebral arteries. There are no areas of chronic hemorrhage.  Post infusion, no abnormal enhancement of the brain or meninges. Dominant LEFT venous system. No signs of venous thrombosis.  Visualized calvarium, skull base, and upper cervical osseous structures unremarkable. Scalp and extracranial soft tissues, orbits, sinuses, and mastoids show no acute process.  IMPRESSION: No acute intracranial findings. No signs of metastatic disease. Minor white matter disease, stable and nonspecific.   Electronically Signed   By: Staci Righter M.D.   On: 01/21/2015 14:48   Nm Pet Image Initial (pi) Skull Base To Thigh  01/27/2015   CLINICAL DATA:  Initial treatment strategy for right upper lobe small cell carcinoma.  EXAM: NUCLEAR MEDICINE PET SKULL BASE TO THIGH  TECHNIQUE: 10.9 mCi F-18 FDG was injected intravenously. Full-ring PET imaging was performed from the skull base to thigh after the radiotracer. CT data was obtained and used for attenuation correction and anatomic localization.   FASTING BLOOD GLUCOSE:  Value: 98 mg/dl  COMPARISON:  01/18/2015  FINDINGS: NECK  Bilateral tonsillar pillar activity appears symmetric and probably physiologic.  CHEST  Right hilar mass maximum standard uptake value 13.0. Similar appearance to prior with regard to occlusion of the right upper lobe tracheobronchial tree and infrahilar and mediastinal extension. Peribronchovascular nodularity in the right upper lobe demonstrates low-level activity, maximum standard uptake value approximately 2.6, possibly from malignancy or postobstructive pneumonitis. Underlying emphysema. Peripherally increased signal along the increasing region of consolidation in the peripheral right upper lobe on image 77 series 4, maximum standard uptake value 3.8.  ABDOMEN/PELVIS  Likely physiologic activity along the cardia of the stomach. Previously described mass in the pancreatic tail has a maximum standard uptake value 12.6. This appears separate from the left adrenal gland. No  hypermetabolic liver lesions.  Infrarenal abdominal aortic aneurysm measuring 5.8 cm in diameter on image 143 series 4, fusiform and with peripheral atherosclerosis. High activity along the right T12 transverse process as along the articulation with the rib and probably degenerative.  SKELETON  No focal hypermetabolic activity to suggest skeletal metastasis.  IMPRESSION: 1. Large hypermetabolic right hilar mass with mediastinal invasion and infrahilar extension, maximum standard uptake value 13.0. 2. Patchy opacities with increased activity in the apical segment right upper lobe and especially peripherally along the posterior inferior margin of the right upper lobe, potentially from tumor extension or postobstructive pneumonitis. 3. Pancreatic tail mass, approximately 4.5 cm in diameter, could be a metastatic lesion or a primary pancreatic adenocarcinoma. No liver metastatic lesion identified. 4. Abdominal aortic aneurysm, 5.8 cm in diameter. Vascular surgery  consultation recommended due to increased risk of rupture for AAA >5.5 cm. This recommendation follows ACR consensus guidelines: White Paper of the ACR Incidental Findings Committee II on Vascular Findings. J Am Coll Radiol 2013; 10:789-794.   Electronically Signed   By: [name] M.D.   On: 01/27/2015 14:10    Mr Jeri Cos DJ Contrast  01/21/2015   CLINICAL DATA:  Lung cancer staging. No neurologic symptoms are reported. Initial encounter.  EXAM: MRI HEAD WITHOUT AND WITH CONTRAST  TECHNIQUE: Multiplanar, multiecho pulse sequences of the brain and surrounding structures were obtained without and with intravenous contrast.  CONTRAST:  MultiHance 20 mL.  COMPARISON:  06/29/2010.  FINDINGS: No evidence for acute infarction, hemorrhage, mass lesion, hydrocephalus, or extra-axial fluid. Go normal for age cerebral volume. Minor white matter disease, nonspecific and stable.  Pituitary, pineal, and cerebellar tonsils unremarkable. No upper cervical lesions.  Flow voids are maintained throughout the carotid, basilar, and vertebral arteries. There are no areas of chronic hemorrhage.  Post infusion, no abnormal enhancement of the brain or meninges. Dominant LEFT venous system. No signs of venous thrombosis.  Visualized calvarium, skull base, and upper cervical osseous structures unremarkable. Scalp and extracranial soft tissues, orbits, sinuses, and mastoids show no acute process.  IMPRESSION: No acute intracranial findings. No signs of metastatic disease. Minor white matter disease, stable and nonspecific.   Electronically Signed   By: Staci Righter M.D.   On: 01/21/2015 14:48  Ct Chest W Contrast  01/18/2015   CLINICAL DATA:  New diagnosis of lung cancer on chest radiograph earlier today. Further evaluation recommended. RIGHT hilar mass.  EXAM: CT CHEST WITH CONTRAST  TECHNIQUE: Multidetector CT imaging of the chest was performed during intravenous contrast administration.  CONTRAST:  30m OMNIPAQUE IOHEXOL 300  MG/ML  SOLN  COMPARISON:  01/18/2015 chest radiograph.  FINDINGS: Musculoskeletal: Thoracic vertebral body height is preserved. Median sternotomy/ CABG. Central canal grossly appears patent by CT. No destructive rib lesions or transthoracic spread of tumor.  Lungs: Centrilobular and paraseptal emphysema is present. Very large RIGHT hilar mass extends cranial to the RIGHT pulmonary artery and pulmonary hilum. The mass measures 8.7 cm transverse by 8.7 cm AP. The mass is centered in the RIGHT hilum with mediastinal invasion.  There is atelectasis and airspace disease distal to the mass suspicious for postobstructive pneumonia in the RIGHT upper lobe.  Central airways: The RIGHT upper lobe bronchus is completely occluded. Bronchus cut off is present on the coronal images (image 64 series 4). Trachea and LEFT central airways patent. The RIGHT middle and RIGHT lower lobe bronchi remain patent.  Vasculature: Mass encases the RIGHT pulmonary artery and almost completely obliterates the lumen. No  acute arterial abnormality. CABG. Descending aortic atherosclerosis with mural plaque and calcification.  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 The azygos vein is engorged, probably representing a source of collateral flow around the obstructed superior vena cava.  Effusions: Small RIGHT dependently layering pleural effusion which may be reactive to airspace disease or malignant. No LEFT pleural effusion is evident. No pericardial effusion.  Lymphadenopathy: No axillary adenopathy. No LEFT hilar adenopathy. Precarinal and LEFT paratracheal lymph nodes are enlarged. Enlarged node measuring 14 mm x 19 mm just to the RIGHT of the AP window.  Esophagus: Grossly normal.  Upper abdomen: Both adrenal glands are within normal limits. No hepatic metastases are visible. There is a mass in the body of the pancreas with mass effect on the adjacent splenic vein. This mass measures 3.4 cm x 2.6 cm.  Other: None.   IMPRESSION: 1. 8.7 x 8.7 cm RIGHT hilar mass encasing the RIGHT pulmonary artery and occluding the RIGHT upper lobe bronchus. Primary bronchogenic carcinoma favored over metastatic disease in this patient with emphysema. 2. Mild airspace disease in the peripheral RIGHT upper lobe, probably postobstructive pneumonia. 3. Small RIGHT pleural effusion which may be malignant or reactive. 4. 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 Correlate for SVC syndrome findings. 5. 34 mm x 26 mm mass in the body of the pancreas, partially visible. Metastatic disease versus pancreatic adenocarcinoma are the primary considerations.   Electronically Signed   By: Dereck Ligas M.D.   On: 01/18/2015 12:54   I have independently reviewed the above radiology studies  and reviewed the findings with the patient.    I have independently reviewed the above radiologic studies.  Recent Lab Findings: Lab Results  Component Value Date   WBC 7.6 02/01/2015   HGB 13.2 02/01/2015   HCT 39.2 02/01/2015   PLT 204 02/01/2015   GLUCOSE 106* 02/01/2015   CHOL 162 01/27/2015   TRIG 265* 01/27/2015   HDL 22* 01/27/2015   LDLDIRECT 124.8 06/28/2010   LDLCALC 87 01/27/2015   ALT 12* 02/01/2015   AST 16 02/01/2015   NA 133* 02/01/2015   K 4.3 02/01/2015   CL 96* 02/01/2015   CREATININE 0.97 02/01/2015   BUN 7 02/01/2015   CO2 26 02/01/2015   TSH 2.35 04/14/2010   INR 1.04 02/01/2015   HGBA1C 5.3 01/02/2012      Assessment / Plan:   Small cell lung cancer by bx- now for portacath placement. Pancreatic Mass of unknown etiology at this point-    34 mm x 26 mm mass in the body of the pancreas has previous distant history of pancreatitis  History of CAD, s/p CABG 01/02/2012   Previous ct scan suggests SVC lumen compromised but no sympotms of SVC obstruction. Will attempt placement but stop if unable to be placed.  The goals risks and alternatives of the planned surgical  procedure portacath placement  have been discussed with the patient in detail. The risks of the procedure including death, infection, stroke, myocardial infarction, bleeding, blood transfusion have all been discussed specifically.  I have quoted [name] a 1% of perioperative mortality and a complication rate as high as 10 %. The patient's questions have been answered.[name] is willing  to proceed with the planned procedure.   [name] MD      [address] [place] [address] Office [phone]   Beeper [phone]  02/01/2015 12:31 PM

## 2015-02-01 NOTE — Telephone Encounter (Signed)
Spoke with patient and notified that Dr. Ardis Hughs office would be contacting him to schedule the biopsy of his pancreas and that we would be getting him scheduled for PICC line placement.  Verbalized understanding of instructions.

## 2015-02-01 NOTE — Brief Op Note (Signed)
      [address]       [place] [address]             [phone]     02/01/2015  2:09 PM  PATIENT:  [name]  54 y.o. male  PRE-OPERATIVE DIAGNOSIS:  LUNG CANCER  POST-OPERATIVE DIAGNOSIS:  LUNG CANCER  PROCEDURE:  Procedure(s): ATTEMPTED INSERTION OF PORT-A-CATH (Left)  SURGEON:  Surgeon(s) and Role:    * [name], MD - Primary   ANESTHESIA:   MAC  EBL:  Total I/O In: 400 [I.V.:400] Out: -   BLOOD ADMINISTERED:none  DRAINS: none   LOCAL MEDICATIONS USED:  LIDOCAINE   SPECIMEN:  No Specimen  DISPOSITION OF SPECIMEN:  N/A  COUNTS:  YES   DICTATION: .Dragon Dictation  PLAN OF CARE: Discharge to home after PACU  PATIENT DISPOSITION:  PACU - hemodynamically stable.   Delay start of Pharmacological VTE agent (>24hrs) due to surgical blood loss or risk of bleeding: yes  Findings, was able to place wire in innominate vein, but degree of svc stenosis would not let wire pass into svc, so did not place and dicussed with oncology to place pic line not into svc for chemo.

## 2015-02-01 NOTE — Anesthesia Preprocedure Evaluation (Addendum)
Anesthesia Evaluation  Patient identified by MRN, date of birth, ID band Patient awake    Reviewed: Allergy & Precautions, NPO status , Patient's Chart, lab work & pertinent test results, reviewed documented beta blocker date and time   History of Anesthesia Complications Negative for: history of anesthetic complications  Airway Mallampati: I  TM Distance: >3 FB Neck ROM: Full    Dental  (+) Edentulous Upper, Edentulous Lower   Pulmonary COPD, Current Smoker,  Lung cancer   breath sounds clear to auscultation       Cardiovascular hypertension, Pt. on medications and Pt. on home beta blockers + CAD, + Cardiac Stents, + CABG and + Peripheral Vascular Disease (AAA: unrepaired)   Rhythm:Regular Rate:Normal  '12 stress: EF 55%, no ischemai   Neuro/Psych negative neurological ROS     GI/Hepatic Neg liver ROS, Pancreatic mass   Endo/Other  Morbid obesity  Renal/GU negative Renal ROS     Musculoskeletal   Abdominal (+) + obese,   Peds  Hematology negative hematology ROS (+)   Anesthesia Other Findings   Reproductive/Obstetrics                          Anesthesia Physical Anesthesia Plan  ASA: III  Anesthesia Plan: MAC   Post-op Pain Management:    Induction:   Airway Management Planned: Simple Face Mask and Natural Airway  Additional Equipment:   Intra-op Plan:   Post-operative Plan:   Informed Consent: I have reviewed the patients History and Physical, chart, labs and discussed the procedure including the risks, benefits and alternatives for the proposed anesthesia with the patient or authorized representative who has indicated his/her understanding and acceptance.   Dental advisory given  Plan Discussed with: CRNA and Surgeon  Anesthesia Plan Comments: (Plan routine monitors, MAC)        Anesthesia Quick Evaluation

## 2015-02-01 NOTE — Telephone Encounter (Signed)
Larene Beach,   I reviewed his films. Should be pretty easy to sample the pancreatic lesion with EUS. If you can let him know we'll be calling to get it scheduled. Possibly this Thursday, at latest next Thursday.  Thanks, I'll let you know what I find.    Sarae Nicholes,  He needs upper EUS, radial +/- linear, this Thursday or next, + MAC, for pancreatic mass, known lung cancer.   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 Would really like a biopsy because it will change prognosis/treatment. IR cannot do.   Sorry for such short notice but just found out path on Friday.   Dr. Verlene Mayer tried to put a port in today and says the patient's SVC is too narrow. Note that he has also seen vascular about the AAA.  Thanks  Larene Beach   Questions call me at [phone]

## 2015-02-02 ENCOUNTER — Encounter (HOSPITAL_COMMUNITY): Payer: Self-pay | Admitting: Cardiothoracic Surgery

## 2015-02-02 ENCOUNTER — Encounter (HOSPITAL_COMMUNITY): Payer: 59

## 2015-02-02 DIAGNOSIS — K8689 Other specified diseases of pancreas: Secondary | ICD-10-CM

## 2015-02-02 DIAGNOSIS — R918 Other nonspecific abnormal finding of lung field: Secondary | ICD-10-CM

## 2015-02-02 MED ORDER — LIDOCAINE-PRILOCAINE 2.5-2.5 % EX CREA: TOPICAL_CREAM | CUTANEOUS | Status: DC

## 2015-02-02 MED ORDER — PROCHLORPERAZINE MALEATE 10 MG PO TABS: 10.0000 mg | ORAL_TABLET | Freq: Four times a day (QID) | ORAL | Status: DC | PRN

## 2015-02-02 MED ORDER — ONDANSETRON HCL 8 MG PO TABS: 8.0000 mg | ORAL_TABLET | Freq: Three times a day (TID) | ORAL | Status: DC | PRN

## 2015-02-02 NOTE — Patient Instructions (Addendum)
[facility]   CHEMOTHERAPY INSTRUCTIONS  Premeds: Aloxi - given through IV - for nausea/vomiting prevention, Emend - given through IV - for nausea/vomiting prevention. Both of these drugs work in the body for approximately 48-72 hours. Dexamethasone - steroid - given through IV - given to also decrease nausea/vomiting. Dex can cause you to feel energized, nervous/anxious/jittery, make you have trouble sleeping, and/or make you feel hot/flushed in the face/neck and/or look pink/red in the face/neck. These side effects will pass as the Dex wears off. (takes 30 minutes to infuse)  Cisplatin - this medication is hard on your kidneys. This is why we give you a large amount of fluids through your IV while you are here getting chemo. We also need you drinking 64 oz of fluid (preferably water/decaff fluids) 2 days prior to chemo and for up to 4-5 days after chemo. Drink more if you can. This will help keep your kidneys flushed. This can also cause acute and/or delayed nausea/vomiting. You must take your nausea meds as prescribed if you get nauseated. Do not wait until you start vomiting. This med can also cause peripheral neuropathy (numbness/tingling/burning in hands/fingers/feet/toes). Let us know if this develops so that we can monitor it and treat if necessary. (this drug takes 1 hour to infuse) Prior to receiving Cisplatin you will receive 2 hours of IV fluids and then after Cisplatin you will receive 2 hours of IV fluids.  VP16 (Etoposide) - this medication can lower your blood pressure. We will monitor this during chemo. Bone marrow suppression (lowers white blood cells (fight infection), lowers red blood cells (make up your blood), lowers platelets (help blood to clot). Hair loss, loss of appetite. (this drug takes 60-90 minutes to infuse)  Neulasta - this medication is not chemo but being given because you have had chemo. It is usually given 24 hours after the completion  of chemotherapy. This medication works by boosting your bone marrow's supply of white blood cells. White blood cells are what protect our bodies against infection. The medication is given in the form of a subcutaneous injection. It is given in the fatty tissue of your abdomen. It is a short needle. The major side effect of this medication is bone or muscle pain. The drug of choice to relieve or lessen the pain is Aleve or Ibuprofen. If a physician has ever told you not to take Aleve or Ibuprofen - then don't take it. You should then take Tylenol/acetaminophen. Take either medication as the bottle directs you to.  The level of pain you experience as a result of this injection can range from none, to mild or moderate, or severe. Please let us know if you develop moderate or severe bone pain.    POTENTIAL SIDE EFFECTS OF TREATMENT: Increased Susceptibility to Infection, Vomiting, Constipation, Hair Thinning, Changes in Character of Skin and Nails (brittleness, dryness,etc.), Bone Marrow Suppression, Complete Hair Loss, Nausea, Diarrhea, Sun Sensitivity and Mouth Sores    EDUCATIONAL MATERIALS GIVEN AND REVIEWED: Chemotherapy and You booklet Specific Instructions Sheets: Cisplatin, Etoposide, Aloxi, Emend, Dexamethasone, EMLA cream, Zofran, Compazine   SELF CARE ACTIVITIES WHILE ON CHEMOTHERAPY: Increase your fluid intake 48 hours prior to treatment and drink at least 2 quarts per day after treatment., No alcohol intake., No aspirin or other medications unless approved by your oncologist., Eat foods that are light and easy to digest., Eat foods at cold or room temperature., No fried, fatty, or spicy foods immediately before or after treatment.,  Have teeth cleaned professionally before starting treatment. Keep dentures and partial plates clean., Use soft toothbrush and do not use mouthwashes that contain alcohol. Biotene is a good mouthwash that is available at most pharmacies or may be ordered by calling  [phone]., Use warm salt water gargles (1 teaspoon salt per 1 quart warm water) before and after meals and at bedtime. Or you may rinse with 2 tablespoons of three -percent hydrogen peroxide mixed in eight ounces of water., Always use sunscreen with SPF (Sun Protection Factor) of 30 or higher., Use your nausea medication as directed to prevent nausea., Use your stool softener or laxative as directed to prevent constipation. and Use your anti-diarrheal medication as directed to stop diarrhea.  Please wash your hands for at least 30 seconds using warm soapy water. Handwashing is the #1 way to prevent the spread of germs. Stay away from sick people or people who are getting over a cold. If you develop respiratory systems such as green/yellow mucus production or productive cough or persistent cough let us know and we will see if you need an antibiotic. It is a good idea to keep a pair of gloves on when going into grocery stores/[employer] to decrease your risk of coming into contact with germs on the carts, etc. Carry alcohol hand gel with you at all times and use it frequently if out in public. All foods need to be cooked thoroughly. No raw foods. No medium or undercooked meats, eggs. If your food is cooked medium well, it does not need to be hot pink or saturated with bloody liquid at all. Vegetables and fruits need to be washed/rinsed under the faucet with a dish detergent before being consumed. You can eat raw fruits and vegetables unless we tell you otherwise but it would be best if you cooked them or bought frozen. Do not eat off of salad bars or hot bars unless you really trust the cleanliness of the restaurant. If you need dental work, please let Dr. Whitney Muse know before you go for your appointment so that we can coordinate the best possible time for you in regards to your chemo regimen. You need to also let your dentist know that you are actively taking chemo. We may need to do labs prior to your dental  appointment. We also want your bowels moving at least every other day. If this is not happening, we need to know so that we can get you on a bowel regimen to help you go.     MEDICATIONS: You have been given prescriptions for the following medications:  Zofran '8mg'$  tablet. Take 1 tablet every 8 hours as needed for nausea/vomiting. (#1 nausea med to take, this can constipate)  Compazine '10mg'$  tablet. Take 1 tablet every 6 hours as needed for nausea/vomiting. (#2 nausea med to take, this can make you sleepy)  EMLA cream. Apply a quarter size amount to port site 1 hour prior to chemo. Do not rub in. Cover with plastic wrap.  Over-the-Counter Meds:  Miralax 1 capful in 8 oz of fluid daily. May increase to two times a day if needed. This is a stool softener. If this doesn't work proceed you can add:  Senokot S  - start with 1 tablet two times a day and increase to 4 tablets two times a day if needed. (total of 8 tablets in a 24 hour period). This is a stimulant laxative.   Call us if this does not help your bowels move.  Imodium '2mg'$  capsule. Take 2 capsules after the 1st loose stool and then 1 capsule every 2 hours until you go a total of 12 hours without having a loose stool. Call the [facility] if loose stools continue.    SYMPTOMS TO REPORT AS SOON AS POSSIBLE AFTER TREATMENT:  FEVER GREATER THAN 100.5 F  CHILLS WITH OR WITHOUT FEVER  NAUSEA AND VOMITING THAT IS NOT CONTROLLED WITH YOUR NAUSEA MEDICATION  UNUSUAL SHORTNESS OF BREATH  UNUSUAL BRUISING OR BLEEDING  TENDERNESS IN MOUTH AND THROAT WITH OR WITHOUT PRESENCE OF ULCERS  URINARY PROBLEMS  BOWEL PROBLEMS  UNUSUAL RASH    Wear comfortable clothing and clothing appropriate for easy access to any Portacath or PICC line. Let us know if there is anything that we can do to make your therapy better!      I have been informed and understand all of the instructions given to me and have received a copy. I have been  instructed to call the clinic [phone] or my family physician as soon as possible for continued medical care, if indicated. I do not have any more questions at this time but understand that I may call the [facility] or the Patient Navigator at [phone] during office hours should I have questions or need assistance in obtaining follow-up care.          Cisplatin injection What is this medicine? CISPLATIN (SIS pla tin) is a chemotherapy drug. It targets fast dividing cells, like cancer cells, and causes these cells to die. This medicine is used to treat many types of cancer like bladder, ovarian, and testicular cancers. This medicine may be used for other purposes; ask your health care provider or pharmacist if you have questions. What should I tell my health care provider before I take this medicine? They need to know if you have any of these conditions: -blood disorders -hearing problems -kidney disease -recent or ongoing radiation therapy -an unusual or allergic reaction to cisplatin, carboplatin, other chemotherapy, other medicines, foods, dyes, or preservatives -pregnant or trying to get pregnant -breast-feeding How should I use this medicine? This drug is given as an infusion into a vein. It is administered in a hospital or clinic by a specially trained health care professional. Talk to your pediatrician regarding the use of this medicine in children. Special care may be needed. Overdosage: If you think you have taken too much of this medicine contact a poison control center or emergency room at once. NOTE: This medicine is only for you. Do not share this medicine with others. What if I miss a dose? It is important not to miss a dose. Call your doctor or health care professional if you are unable to keep an appointment. What may interact with this medicine? -dofetilide -foscarnet -medicines for seizures -medicines to increase blood counts like filgrastim,  pegfilgrastim, sargramostim -probenecid -pyridoxine used with altretamine -rituximab -some antibiotics like amikacin, gentamicin, neomycin, polymyxin B, streptomycin, tobramycin -sulfinpyrazone -vaccines -zalcitabine Talk to your doctor or health care professional before taking any of these medicines: -acetaminophen -aspirin -ibuprofen -ketoprofen -naproxen This list may not describe all possible interactions. Give your health care provider a list of all the medicines, herbs, non-prescription drugs, or dietary supplements you use. Also tell them if you smoke, drink alcohol, or use illegal drugs. Some items may interact with your medicine. What should I watch for while using this medicine? Your condition will be monitored carefully while you are receiving this medicine. You will need  important blood work done while you are taking this medicine. This drug may make you feel generally unwell. This is not uncommon, as chemotherapy can affect healthy cells as well as cancer cells. Report any side effects. Continue your course of treatment even though you feel ill unless your doctor tells you to stop. In some cases, you may be given additional medicines to help with side effects. Follow all directions for their use. Call your doctor or health care professional for advice if you get a fever, chills or sore throat, or other symptoms of a cold or flu. Do not treat yourself. This drug decreases your body's ability to fight infections. Try to avoid being around people who are sick. This medicine may increase your risk to bruise or bleed. Call your doctor or health care professional if you notice any unusual bleeding. Be careful brushing and flossing your teeth or using a toothpick because you may get an infection or bleed more easily. If you have any dental work done, tell your dentist you are receiving this medicine. Avoid taking products that contain aspirin, acetaminophen, ibuprofen, naproxen, or  ketoprofen unless instructed by your doctor. These medicines may hide a fever. Do not become pregnant while taking this medicine. Women should inform their doctor if they wish to become pregnant or think they might be pregnant. There is a potential for serious side effects to an unborn child. Talk to your health care professional or pharmacist for more information. Do not breast-feed an infant while taking this medicine. Drink fluids as directed while you are taking this medicine. This will help protect your kidneys. Call your doctor or health care professional if you get diarrhea. Do not treat yourself. What side effects may I notice from receiving this medicine? Side effects that you should report to your doctor or health care professional as soon as possible: -allergic reactions like skin rash, itching or hives, swelling of the face, lips, or tongue -signs of infection - fever or chills, cough, sore throat, pain or difficulty passing urine -signs of decreased platelets or bleeding - bruising, pinpoint red spots on the skin, black, tarry stools, nosebleeds -signs of decreased red blood cells - unusually weak or tired, fainting spells, lightheadedness -breathing problems -changes in hearing -gout pain -low blood counts - This drug may decrease the number of white blood cells, red blood cells and platelets. You may be at increased risk for infections and bleeding. -nausea and vomiting -pain, swelling, redness or irritation at the injection site -pain, tingling, numbness in the hands or feet -problems with balance, movement -trouble passing urine or change in the amount of urine Side effects that usually do not require medical attention (report to your doctor or health care professional if they continue or are bothersome): -changes in vision -loss of appetite -metallic taste in the mouth or changes in taste This list may not describe all possible side effects. Call your doctor for medical  advice about side effects. You may report side effects to FDA at 1-800-FDA-1088. Where should I keep my medicine? This drug is given in a hospital or clinic and will not be stored at home. NOTE: This sheet is a summary. It may not cover all possible information. If you have questions about this medicine, talk to your doctor, pharmacist, or health care provider.    2016, Elsevier/[employer] Standard. (2007-07-15 14:40:54) Etoposide, VP-16 injection What is this medicine? ETOPOSIDE, VP-16 (e toe POE side) is a chemotherapy drug. It is used to treat testicular cancer,  lung cancer, and other cancers. This medicine may be used for other purposes; ask your health care provider or pharmacist if you have questions. What should I tell my health care provider before I take this medicine? They need to know if you have any of these conditions: -infection -kidney disease -low blood counts, like low white cell, platelet, or red cell counts -an unusual or allergic reaction to etoposide, other chemotherapeutic agents, other medicines, foods, dyes, or preservatives -pregnant or trying to get pregnant -breast-feeding How should I use this medicine? This medicine is for infusion into a vein. It is administered in a hospital or clinic by a specially trained health care professional. Talk to your pediatrician regarding the use of this medicine in children. Special care may be needed. Overdosage: If you think you have taken too much of this medicine contact a poison control center or emergency room at once. NOTE: This medicine is only for you. Do not share this medicine with others. What if I miss a dose? It is important not to miss your dose. Call your doctor or health care professional if you are unable to keep an appointment. What may interact with this medicine? -aspirin -certain medications for seizures like carbamazepine, phenobarbital, phenytoin, valproic acid -cyclosporine -levamisole -warfarin This list  may not describe all possible interactions. Give your health care provider a list of all the medicines, herbs, non-prescription drugs, or dietary supplements you use. Also tell them if you smoke, drink alcohol, or use illegal drugs. Some items may interact with your medicine. What should I watch for while using this medicine? Visit your doctor for checks on your progress. This drug may make you feel generally unwell. This is not uncommon, as chemotherapy can affect healthy cells as well as cancer cells. Report any side effects. Continue your course of treatment even though you feel ill unless your doctor tells you to stop. In some cases, you may be given additional medicines to help with side effects. Follow all directions for their use. Call your doctor or health care professional for advice if you get a fever, chills or sore throat, or other symptoms of a cold or flu. Do not treat yourself. This drug decreases your body's ability to fight infections. Try to avoid being around people who are sick. This medicine may increase your risk to bruise or bleed. Call your doctor or health care professional if you notice any unusual bleeding. Be careful brushing and flossing your teeth or using a toothpick because you may get an infection or bleed more easily. If you have any dental work done, tell your dentist you are receiving this medicine. Avoid taking products that contain aspirin, acetaminophen, ibuprofen, naproxen, or ketoprofen unless instructed by your doctor. These medicines may hide a fever. Do not become pregnant while taking this medicine or for at least 6 months after stopping it. Women should inform their doctor if they wish to become pregnant or think they might be pregnant. Women of child-bearing potential will need to have a negative pregnancy test before starting this medicine. There is a potential for serious side effects to an unborn child. Talk to your health care professional or pharmacist for  more information. Do not breast-feed an infant while taking this medicine. Men must use a latex condom during sexual contact with a woman while taking this medicine and for at least 4 months after stopping it. A latex condom is needed even if you have had a vasectomy. Contact your doctor right away if  your partner becomes pregnant. Do not donate sperm while taking this medicine and for at least 4 months after you stop taking this medicine. Men should inform their doctors if they wish to father a child. This medicine may lower sperm counts. What side effects may I notice from receiving this medicine? Side effects that you should report to your doctor or health care professional as soon as possible: -allergic reactions like skin rash, itching or hives, swelling of the face, lips, or tongue -low blood counts - this medicine may decrease the number of white blood cells, red blood cells and platelets. You may be at increased risk for infections and bleeding. -signs of infection - fever or chills, cough, sore throat, pain or difficulty passing urine -signs of decreased platelets or bleeding - bruising, pinpoint red spots on the skin, black, tarry stools, blood in the urine -signs of decreased red blood cells - unusually weak or tired, fainting spells, lightheadedness -breathing problems -changes in vision -mouth or throat sores or ulcers -pain, redness, swelling or irritation at the injection site -pain, tingling, numbness in the hands or feet -redness, blistering, peeling or loosening of the skin, including inside the mouth -seizures -vomiting Side effects that usually do not require medical attention (report to your doctor or health care professional if they continue or are bothersome): -diarrhea -hair loss -loss of appetite -nausea -stomach pain This list may not describe all possible side effects. Call your doctor for medical advice about side effects. You may report side effects to FDA at  1-800-FDA-1088. Where should I keep my medicine? This drug is given in a hospital or clinic and will not be stored at home. NOTE: This sheet is a summary. It may not cover all possible information. If you have questions about this medicine, talk to your doctor, pharmacist, or health care provider.    2016, [employer]/Gold Standard. (2013-12-03 12:32:50) Palonosetron Injection What is this medicine? PALONOSETRON (pal oh NOE se tron) is used to prevent nausea and vomiting caused by chemotherapy. It also helps prevent delayed nausea and vomiting that may occur a few days after your treatment. This medicine may be used for other purposes; ask your health care provider or pharmacist if you have questions. What should I tell my health care provider before I take this medicine? They need to know if you have any of these conditions: -an unusual or allergic reaction to palonosetron, dolasetron, granisetron, ondansetron, other medicines, foods, dyes, or preservatives -pregnant or trying to get pregnant -breast-feeding How should I use this medicine? This medicine is for infusion into a vein. It is given by a health care professional in a hospital or clinic setting. Talk to your pediatrician regarding the use of this medicine in children. While this drug may be prescribed for children as young as 1 month for selected conditions, precautions do apply. Overdosage: If you think you have taken too much of this medicine contact a poison control center or emergency room at once. NOTE: This medicine is only for you. Do not share this medicine with others. What if I miss a dose? This does not apply. What may interact with this medicine? -certain medicines for depression, anxiety, or psychotic disturbances -fentanyl -linezolid -MAOIs like Carbex, Eldepryl, Marplan, Nardil, and Parnate -methylene blue (injected into a vein) -tramadol This list may not describe all possible interactions. Give your health care  provider a list of all the medicines, herbs, non-prescription drugs, or dietary supplements you use. Also tell them if you smoke, drink  alcohol, or use illegal drugs. Some items may interact with your medicine. What should I watch for while using this medicine? Your condition will be monitored carefully while you are receiving this medicine. What side effects may I notice from receiving this medicine? Side effects that you should report to your doctor or health care professional as soon as possible: -allergic reactions like skin rash, itching or hives, swelling of the face, lips, or tongue -breathing problems -confusion -dizziness -fast, irregular heartbeat -fever and chills -loss of balance or coordination -seizures -sweating -swelling of the hands and feet -tremors -unusually weak or tired Side effects that usually do not require medical attention (report to your doctor or health care professional if they continue or are bothersome): -constipation or diarrhea -headache This list may not describe all possible side effects. Call your doctor for medical advice about side effects. You may report side effects to FDA at 1-800-FDA-1088. Where should I keep my medicine? This drug is given in a hospital or clinic and will not be stored at home. NOTE: This sheet is a summary. It may not cover all possible information. If you have questions about this medicine, talk to your doctor, pharmacist, or health care provider.    2016, [employer] Standard. (2013-02-13 10:38:36) Fosaprepitant injection What is this medicine? FOSAPREPITANT (fos ap RE pi tant) is used together with other medicines to prevent nausea and vomiting caused by cancer treatment (chemotherapy). This medicine may be used for other purposes; ask your health care provider or pharmacist if you have questions. What should I tell my health care provider before I take this medicine? They need to know if you have any of these  conditions: -liver disease -an unusual or allergic reaction to fosaprepitant, aprepitant, medicines, foods, dyes, or preservatives -pregnant or trying to get pregnant -breast-feeding How should I use this medicine? This medicine is for injection into a vein. It is given by a health care professional in a hospital or clinic setting. Talk to your pediatrician regarding the use of this medicine in children. Special care may be needed. Overdosage: If you think you have taken too much of this medicine contact a poison control center or emergency room at once. NOTE: This medicine is only for you. Do not share this medicine with others. What if I miss a dose? This does not apply. What may interact with this medicine? Do not take this medicine with any of these medicines: -cisapride -flibanserin -lomitapide -pimozide This medicine may also interact with the following medications: -diltiazem -male hormones, like estrogens or progestins and birth control pills -medicines for fungal infections like ketoconazole and itraconazole -medicines for HIV -medicines for seizures or to control epilepsy like carbamazepine or phenytoin -medicines used for sleep or anxiety disorders like alprazolam, diazepam, or midazolam -nefazodone -paroxetine -ranolazine -rifampin -some chemotherapy medications like etoposide, ifosfamide, vinblastine, vincristine -some antibiotics like clarithromycin, erythromycin, troleandomycin -steroid medicines like dexamethasone or methylprednisolone -tolbutamide -warfarin This list may not describe all possible interactions. Give your health care provider a list of all the medicines, herbs, non-prescription drugs, or dietary supplements you use. Also tell them if you smoke, drink alcohol, or use illegal drugs. Some items may interact with your medicine. What should I watch for while using this medicine? Do not take this medicine if you already have nausea and vomiting. Ask  your health care provider what to do if you already have nausea. Birth control pills and other methods of hormonal contraception (for example, IUD or patch) may not work properly  while you are taking this medicine. Use an extra method of birth control during treatment and for 1 month after your last dose of fosaprepitant. This medicine should not be used continuously for a long time. Visit your doctor or health care professional for regular check-ups. This medicine may change your liver function blood test results. What side effects may I notice from receiving this medicine? Side effects that you should report to your doctor or health care professional as soon as possible: -allergic reactions like skin rash, itching or hives, swelling of the face, lips, or tongue -breathing problems -changes in heart rhythm -high or low blood pressure -pain, redness, or irritation at site where injected -rectal bleeding -serious dizziness or disorientation, confusion -sharp or severe stomach pain -sharp pain in your leg Side effects that usually do not require medical attention (report to your doctor or health care professional if they continue or are bothersome): -constipation or diarrhea -hair loss -headache -hiccups -loss of appetite -nausea -upset stomach -tiredness This list may not describe all possible side effects. Call your doctor for medical advice about side effects. You may report side effects to FDA at 1-800-FDA-1088. Where should I keep my medicine? This drug is given in a hospital or clinic and will not be stored at home. NOTE: This sheet is a summary. It may not cover all possible information. If you have questions about this medicine, talk to your doctor, pharmacist, or health care provider.    2016, [employer]. (2014-05-26 10:45:34) Dexamethasone injection What is this medicine? DEXAMETHASONE (dex a METH a sone) is a corticosteroid. It is used to treat inflammation of  the skin, joints, lungs, and other organs. Common conditions treated include asthma, allergies, and arthritis. It is also used for other conditions, like blood disorders and diseases of the adrenal glands. This medicine may be used for other purposes; ask your health care provider or pharmacist if you have questions. What should I tell my health care provider before I take this medicine? They need to know if you have any of these conditions: -blood clotting problems -Cushing's syndrome -diabetes -glaucoma -heart problems or disease -high blood pressure -infection like herpes, measles, tuberculosis, or chickenpox -kidney disease -liver disease -mental problems -myasthenia gravis -osteoporosis -previous heart attack -seizures -stomach, ulcer or intestine disease including colitis and diverticulitis -thyroid problem -an unusual or allergic reaction to dexamethasone, corticosteroids, other medicines, lactose, foods, dyes, or preservatives -pregnant or trying to get pregnant -breast-feeding How should I use this medicine? This medicine is for injection into a muscle, joint, lesion, soft tissue, or vein. It is given by a health care professional in a hospital or clinic setting. Talk to your pediatrician regarding the use of this medicine in children. Special care may be needed. Overdosage: If you think you have taken too much of this medicine contact a poison control center or emergency room at once. NOTE: This medicine is only for you. Do not share this medicine with others. What if I miss a dose? This may not apply. If you are having a series of injections over a prolonged period, try not to miss an appointment. Call your doctor or health care professional to reschedule if you are unable to keep an appointment. What may interact with this medicine? Do not take this medicine with any of the following medications: -mifepristone, RU-486 -vaccines This medicine may also interact with the  following medications: -amphotericin B -antibiotics like clarithromycin, erythromycin, and troleandomycin -aspirin and aspirin-like drugs -barbiturates like phenobarbital -carbamazepine -cholestyramine -  cholinesterase inhibitors like donepezil, galantamine, rivastigmine, and tacrine -cyclosporine -digoxin -diuretics -ephedrine -male hormones, like estrogens or progestins and birth control pills -indinavir -isoniazid -ketoconazole -medicines for diabetes -medicines that improve muscle tone or strength for conditions like myasthenia gravis -NSAIDs, medicines for pain and inflammation, like ibuprofen or naproxen -phenytoin -rifampin -thalidomide -warfarin This list may not describe all possible interactions. Give your health care provider a list of all the medicines, herbs, non-prescription drugs, or dietary supplements you use. Also tell them if you smoke, drink alcohol, or use illegal drugs. Some items may interact with your medicine. What should I watch for while using this medicine? Your condition will be monitored carefully while you are receiving this medicine. If you are taking this medicine for a long time, carry an identification card with your name and address, the type and dose of your medicine, and your doctor's name and address. This medicine may increase your risk of getting an infection. Stay away from people who are sick. Tell your doctor or health care professional if you are around anyone with measles or chickenpox. Talk to your health care provider before you get any vaccines that you take this medicine. If you are going to have surgery, tell your doctor or health care professional that you have taken this medicine within the last twelve months. Ask your doctor or health care professional about your diet. You may need to lower the amount of salt you eat. The medicine can increase your blood sugar. If you are a diabetic check with your doctor if you need help adjusting  the dose of your diabetic medicine. What side effects may I notice from receiving this medicine? Side effects that you should report to your doctor or health care professional as soon as possible: -allergic reactions like skin rash, itching or hives, swelling of the face, lips, or tongue -black or tarry stools -change in the amount of urine -changes in vision -confusion, excitement, restlessness, a false sense of well-being -fever, sore throat, sneezing, cough, or other signs of infection, wounds that will not heal -hallucinations -increased thirst -mental depression, mood swings, mistaken feelings of self importance or of being mistreated -pain in hips, back, ribs, arms, shoulders, or legs -pain, redness, or irritation at the injection site -redness, blistering, peeling or loosening of the skin, including inside the mouth -rounding out of face -swelling of feet or lower legs -unusual bleeding or bruising -unusual tired or weak -wounds that do not heal Side effects that usually do not require medical attention (report to your doctor or health care professional if they continue or are bothersome): -diarrhea or constipation -change in taste -headache -nausea, vomiting -skin problems, acne, thin and shiny skin -touble sleeping -unusual growth of hair on the face or body -weight gain This list may not describe all possible side effects. Call your doctor for medical advice about side effects. You may report side effects to FDA at 1-800-FDA-1088. Where should I keep my medicine? This drug is given in a hospital or clinic and will not be stored at home. NOTE: This sheet is a summary. It may not cover all possible information. If you have questions about this medicine, talk to your doctor, pharmacist, or health care provider.    2016, [employer]. (2007-07-31 14:04:12) Pegfilgrastim injection What is this medicine? PEGFILGRASTIM (PEG fil gra stim) is a long-acting granulocyte  colony-stimulating factor that stimulates the growth of neutrophils, a type of white blood cell important in the body's fight against infection. It is used  to reduce the incidence of fever and infection in patients with certain types of cancer who are receiving chemotherapy that affects the bone marrow, and to increase survival after being exposed to high doses of radiation. This medicine may be used for other purposes; ask your health care provider or pharmacist if you have questions. What should I tell my health care provider before I take this medicine? They need to know if you have any of these conditions: -kidney disease -latex allergy -ongoing radiation therapy -sickle cell disease -skin reactions to acrylic adhesives (On-Body Injector only) -an unusual or allergic reaction to pegfilgrastim, filgrastim, other medicines, foods, dyes, or preservatives -pregnant or trying to get pregnant -breast-feeding How should I use this medicine? This medicine is for injection under the skin. If you get this medicine at home, you will be taught how to prepare and give the pre-filled syringe or how to use the On-body Injector. Refer to the patient Instructions for Use for detailed instructions. Use exactly as directed. Take your medicine at regular intervals. Do not take your medicine more often than directed. It is important that you put your used needles and syringes in a special sharps container. Do not put them in a trash can. If you do not have a sharps container, call your pharmacist or healthcare provider to get one. Talk to your pediatrician regarding the use of this medicine in children. While this drug may be prescribed for selected conditions, precautions do apply. Overdosage: If you think you have taken too much of this medicine contact a poison control center or emergency room at once. NOTE: This medicine is only for you. Do not share this medicine with others. What if I miss a dose? It is  important not to miss your dose. Call your doctor or health care professional if you miss your dose. If you miss a dose due to an On-body Injector failure or leakage, a new dose should be administered as soon as possible using a single prefilled syringe for manual use. What may interact with this medicine? Interactions have not been studied. Give your health care provider a list of all the medicines, herbs, non-prescription drugs, or dietary supplements you use. Also tell them if you smoke, drink alcohol, or use illegal drugs. Some items may interact with your medicine. This list may not describe all possible interactions. Give your health care provider a list of all the medicines, herbs, non-prescription drugs, or dietary supplements you use. Also tell them if you smoke, drink alcohol, or use illegal drugs. Some items may interact with your medicine. What should I watch for while using this medicine? You may need blood work done while you are taking this medicine. If you are going to need a MRI, CT scan, or other procedure, tell your doctor that you are using this medicine (On-Body Injector only). What side effects may I notice from receiving this medicine? Side effects that you should report to your doctor or health care professional as soon as possible: -allergic reactions like skin rash, itching or hives, swelling of the face, lips, or tongue -dizziness -fever -pain, redness, or irritation at site where injected -pinpoint red spots on the skin -red or dark-brown urine -shortness of breath or breathing problems -stomach or side pain, or pain at the shoulder -swelling -tiredness -trouble passing urine or change in the amount of urine Side effects that usually do not require medical attention (report to your doctor or health care professional if they continue or are bothersome): -bone  pain -muscle pain This list may not describe all possible side effects. Call your doctor for medical advice  about side effects. You may report side effects to FDA at 1-800-FDA-1088. Where should I keep my medicine? Keep out of the reach of children. Store pre-filled syringes in a refrigerator between 2 and 8 degrees C (36 and 46 degrees F). Do not freeze. Keep in carton to protect from light. Throw away this medicine if it is left out of the refrigerator for more than 48 hours. Throw away any unused medicine after the expiration date. NOTE: This sheet is a summary. It may not cover all possible information. If you have questions about this medicine, talk to your doctor, pharmacist, or health care provider.    2016, [employer]. (2014-04-29 14:30:14) Prochlorperazine tablets What is this medicine? PROCHLORPERAZINE (proe klor PER a zeen) helps to control severe nausea and vomiting. This medicine is also used to treat schizophrenia. It can also help patients who experience anxiety that is not due to psychological illness. This medicine may be used for other purposes; ask your health care provider or pharmacist if you have questions. What should I tell my health care provider before I take this medicine? They need to know if you have any of these conditions: -blood disorders or disease -dementia -liver disease or jaundice -Parkinson's disease -uncontrollable movement disorder -an unusual or allergic reaction to prochlorperazine, other medicines, foods, dyes, or preservatives -pregnant or trying to get pregnant -breast-feeding How should I use this medicine? Take this medicine by mouth with a glass of water. Follow the directions on the prescription label. Take your doses at regular intervals. Do not take your medicine more often than directed. Do not stop taking this medicine suddenly. This can cause nausea, vomiting, and dizziness. Ask your doctor or health care professional for advice. Talk to your pediatrician regarding the use of this medicine in children. Special care may be needed.  While this drug may be prescribed for children as young as 2 years for selected conditions, precautions do apply. Overdosage: If you think you have taken too much of this medicine contact a poison control center or emergency room at once. NOTE: This medicine is only for you. Do not share this medicine with others. What if I miss a dose? If you miss a dose, take it as soon as you can. If it is almost time for your next dose, take only that dose. Do not take double or extra doses. What may interact with this medicine? Do not take this medicine with any of the following medications: -amoxapine -antidepressants like citalopram, escitalopram, fluoxetine, paroxetine, and sertraline -deferoxamine -dofetilide -maprotiline -tricyclic antidepressants like amitriptyline, clomipramine, imipramine, nortiptyline and others This medicine may also interact with the following medications: -lithium -medicines for pain -phenytoin -propranolol -warfarin This list may not describe all possible interactions. Give your health care provider a list of all the medicines, herbs, non-prescription drugs, or dietary supplements you use. Also tell them if you smoke, drink alcohol, or use illegal drugs. Some items may interact with your medicine. What should I watch for while using this medicine? Visit your doctor or health care professional for regular checks on your progress. You may get drowsy or dizzy. Do not drive, use machinery, or do anything that needs mental alertness until you know how this medicine affects you. Do not stand or sit up quickly, especially if you are an older patient. This reduces the risk of dizzy or fainting spells. Alcohol may interfere with  the effect of this medicine. Avoid alcoholic drinks. This medicine can reduce the response of your body to heat or cold. Dress warm in cold weather and stay hydrated in hot weather. If possible, avoid extreme temperatures like saunas, hot tubs, very hot or  cold showers, or activities that can cause dehydration such as vigorous exercise. This medicine can make you more sensitive to the sun. Keep out of the sun. If you cannot avoid being in the sun, wear protective clothing and use sunscreen. Do not use sun lamps or tanning beds/booths. Your mouth may get dry. Chewing sugarless gum or sucking hard candy, and drinking plenty of water may help. Contact your doctor if the problem does not go away or is severe. What side effects may I notice from receiving this medicine? Side effects that you should report to your doctor or health care professional as soon as possible: -blurred vision -breast enlargement in men or women -breast milk in women who are not breast-feeding -chest pain, fast or irregular heartbeat -confusion, restlessness -dark yellow or brown urine -difficulty breathing or swallowing -dizziness or fainting spells -drooling, shaking, movement difficulty (shuffling walk) or rigidity -fever, chills, sore throat -involuntary or uncontrollable movements of the eyes, mouth, head, arms, and legs -seizures -stomach area pain -unusually weak or tired -unusual bleeding or bruising -yellowing of skin or eyes Side effects that usually do not require medical attention (report to your doctor or health care professional if they continue or are bothersome): -difficulty passing urine -difficulty sleeping -headache -sexual dysfunction -skin rash, or itching This list may not describe all possible side effects. Call your doctor for medical advice about side effects. You may report side effects to FDA at 1-800-FDA-1088. Where should I keep my medicine? Keep out of the reach of children. Store at room temperature between 15 and 30 degrees C (59 and 86 degrees F). Protect from light. Throw away any unused medicine after the expiration date. NOTE: This sheet is a summary. It may not cover all possible information. If you have questions about this  medicine, talk to your doctor, pharmacist, or health care provider.    2016, [employer]. (2011-08-28 16:59:39) Ondansetron tablets What is this medicine? ONDANSETRON (on DAN se tron) is used to treat nausea and vomiting caused by chemotherapy. It is also used to prevent or treat nausea and vomiting after surgery. This medicine may be used for other purposes; ask your health care provider or pharmacist if you have questions. What should I tell my health care provider before I take this medicine? They need to know if you have any of these conditions: -heart disease -history of irregular heartbeat -liver disease -low levels of magnesium or potassium in the blood -an unusual or allergic reaction to ondansetron, granisetron, other medicines, foods, dyes, or preservatives -pregnant or trying to get pregnant -breast-feeding How should I use this medicine? Take this medicine by mouth with a glass of water. Follow the directions on your prescription label. Take your doses at regular intervals. Do not take your medicine more often than directed. Talk to your pediatrician regarding the use of this medicine in children. Special care may be needed. Overdosage: If you think you have taken too much of this medicine contact a poison control center or emergency room at once. NOTE: This medicine is only for you. Do not share this medicine with others. What if I miss a dose? If you miss a dose, take it as soon as you can. If it is almost time  for your next dose, take only that dose. Do not take double or extra doses. What may interact with this medicine? Do not take this medicine with any of the following medications: -apomorphine -certain medicines for fungal infections like fluconazole, itraconazole, ketoconazole, posaconazole, voriconazole -cisapride -dofetilide -dronedarone -pimozide -thioridazine -ziprasidone This medicine may also interact with the following  medications: -carbamazepine -certain medicines for depression, anxiety, or psychotic disturbances -fentanyl -linezolid -MAOIs like Carbex, Eldepryl, Marplan, Nardil, and Parnate -methylene blue (injected into a vein) -other medicines that prolong the QT interval (cause an abnormal heart rhythm) -phenytoin -rifampicin -tramadol This list may not describe all possible interactions. Give your health care provider a list of all the medicines, herbs, non-prescription drugs, or dietary supplements you use. Also tell them if you smoke, drink alcohol, or use illegal drugs. Some items may interact with your medicine. What should I watch for while using this medicine? Check with your doctor or health care professional right away if you have any sign of an allergic reaction. What side effects may I notice from receiving this medicine? Side effects that you should report to your doctor or health care professional as soon as possible: -allergic reactions like skin rash, itching or hives, swelling of the face, lips or tongue -breathing problems -confusion -dizziness -fast or irregular heartbeat -feeling faint or lightheaded, falls -fever and chills -loss of balance or coordination -seizures -sweating -swelling of the hands or feet -tightness in the chest -tremors -unusually weak or tired Side effects that usually do not require medical attention (report to your doctor or health care professional if they continue or are bothersome): -constipation or diarrhea -headache This list may not describe all possible side effects. Call your doctor for medical advice about side effects. You may report side effects to FDA at 1-800-FDA-1088. Where should I keep my medicine? Keep out of the reach of children. Store between 2 and 30 degrees C (36 and 86 degrees F). Throw away any unused medicine after the expiration date. NOTE: This sheet is a summary. It may not cover all possible information. If you have  questions about this medicine, talk to your doctor, pharmacist, or health care provider.    2016, [employer]. (2013-01-14 16:27:45) Lidocaine; Prilocaine cream What is this medicine? LIDOCAINE; PRILOCAINE (LYE doe kane; PRIL oh kane) is a topical anesthetic that causes loss of feeling in the skin and surrounding tissues. It is used to numb the skin before procedures or injections. This medicine may be used for other purposes; ask your health care provider or pharmacist if you have questions. What should I tell my health care provider before I take this medicine? They need to know if you have any of these conditions: -glucose-6-phosphate deficiencies -heart disease -kidney or liver disease -methemoglobinemia -an unusual or allergic reaction to lidocaine, prilocaine, other medicines, foods, dyes, or preservatives -pregnant or trying to get pregnant -breast-feeding How should I use this medicine? This medicine is for external use only on the skin. Do not take by mouth. Follow the directions on the prescription label. Wash hands before and after use. Do not use more or leave in contact with the skin longer than directed. Do not apply to eyes or open wounds. It can cause irritation and blurred or temporary loss of vision. If this medicine comes in contact with your eyes, immediately rinse the eye with water. Do not touch or rub the eye. Contact your health care provider right away. Talk to your pediatrician regarding the use of this medicine  in children. While this medicine may be prescribed for children for selected conditions, precautions do apply. Overdosage: If you think you have taken too much of this medicine contact a poison control center or emergency room at once. NOTE: This medicine is only for you. Do not share this medicine with others. What if I miss a dose? This medicine is usually only applied once prior to each procedure. It must be in contact with the skin for a period  of time for it to work. If you applied this medicine later than directed, tell your health care professional before starting the procedure. What may interact with this medicine? -acetaminophen -chloroquine -dapsone -medicines to control heart rhythm -nitrates like nitroglycerin and nitroprusside -other ointments, creams, or sprays that may contain anesthetic medicine -phenobarbital -phenytoin -quinine -sulfonamides like sulfacetamide, sulfamethoxazole, sulfasalazine and others This list may not describe all possible interactions. Give your health care provider a list of all the medicines, herbs, non-prescription drugs, or dietary supplements you use. Also tell them if you smoke, drink alcohol, or use illegal drugs. Some items may interact with your medicine. What should I watch for while using this medicine? Be careful to avoid injury to the treated area while it is numb and you are not aware of pain. Avoid scratching, rubbing, or exposing the treated area to hot or cold temperatures until complete sensation has returned. The numb feeling will wear off a few hours after applying the cream. What side effects may I notice from receiving this medicine? Side effects that you should report to your doctor or health care professional as soon as possible: -blurred vision -chest pain -difficulty breathing -dizziness -drowsiness -fast or irregular heartbeat -skin rash or itching -swelling of your throat, lips, or face -trembling Side effects that usually do not require medical attention (report to your doctor or health care professional if they continue or are bothersome): -changes in ability to feel hot or cold -redness and swelling at the application site This list may not describe all possible side effects. Call your doctor for medical advice about side effects. You may report side effects to FDA at 1-800-FDA-1088. Where should I keep my medicine? Keep out of reach of children. Store at room  temperature between 15 and 30 degrees C (59 and 86 degrees F). Keep container tightly closed. Throw away any unused medicine after the expiration date. NOTE: This sheet is a summary. It may not cover all possible information. If you have questions about this medicine, talk to your doctor, pharmacist, or health care provider.    2016, Elsevier/Gold Standard. (2007-10-13 17:14:35) PICC Insertion A peripherally inserted central catheter (PICC) is a long, thin, flexible tube that is inserted into a vein in the upper arm. It is a form of IV access. It is considered to be a "central" line because the tip of the PICC ends in a large vein in your chest. This large vein is called the superior vena cava (SVC). The PICC tip ends in the SVC because there is a lot of blood flow in the SVC. This allows medicines and IV fluids to be quickly distributed throughout the body. The PICC is inserted using a sterile technique by a specially trained health care provider. After the PICC is inserted, a chest X-ray may be done to ensure that it is in the correct place.  LET [facility] CARE PROVIDER KNOW ABOUT:   Any allergies you have.  All medicines you are taking, including vitamins, herbs, eye drops, creams, and over-the-counter  medicines.  Previous problems you or members of your family have had with the use of anesthetics.  Any blood disorders you have.  Previous surgeries you have had.  Medical conditions you have. RISKS AND COMPLICATIONS Generally, this is a safe procedure. However, as with any procedure, complications can occur. Possible complications include:  Infection at the insertion site or in the blood.  Bleeding at the insertion site or internally.  Injury or collapse of the lung.  Movement or malposition of the PICC.  Inflammation of the vein (phlebitis).  Nerve injury or irritation.  Clot formation at the tip of the PICC line.  Blood clot in the lung (pulmonary embolus).  Injury to the  large blood vessels or heart (rare). BEFORE THE PROCEDURE   Your health care provider may want you to have blood tests. These tests can help tell how well your blood clots.  If you take blood thinners (anticoagulant medicine), ask your health care provider if you should stop taking them. PROCEDURE   You will have to lie flat for about 30-45 minutes for this procedure.  Your health care provider will start by identifying a vein into which the PICC line will be placed. This is done using ultrasound or X-ray guidance.  Medicine is used to numb the skin around the insertion site.  The skin where the catheter will be inserted is cleaned and covered with a sterile surgical drape.  A small needle is inserted into the vein, and then a small guidewire is advanced into the superior vena cava.  The catheter is then advanced over the guidewire and moved into position. The guidewire is then removed.  The catheter is flushed and blood is drawn back to confirm it is in the vein.  If this is done without X-ray guidance, an X-ray will be needed to make sure the catheter tip is in the correct position.  Once the placement is confirmed, the PICC is secured to the skin with a device and covered with a sterile dressing. AFTER THE PROCEDURE  You should avoid any strenuous activity for the next 2 days or as directed by your health care provider.  You will be allowed to go back to your regular activities after the procedure.  You will be instructed on the care of your PICC line.   This information is not intended to replace advice given to you by your health care provider. Make sure you discuss any questions you have with your health care provider.   Document Released: 01/28/2013 Document Revised: 04/14/2013 Document Reviewed: 01/28/2013 Elsevier Interactive Patient Education 2016 [place] A peripherally inserted central catheter (PICC) is a long, thin, flexible tube that is  inserted into a vein in the upper arm. It is a form of intravenous (IV) access. It is considered to be a "central" line because the tip of the PICC ends in a large vein in your chest. This large vein is called the superior vena cava (SVC). The PICC tip ends in the SVC because there is a lot of blood flow in the SVC. This allows medicines and IV fluids to be quickly distributed throughout the body. The PICC is inserted using a sterile technique by a specially trained nurse or physician. After the PICC is inserted, a chest X-ray exam is done to be sure it is in the correct place.  A PICC may be placed for different reasons, such as:  To give medicines and liquid nutrition that can only be given  through a central line. Examples are:  Certain antibiotic treatments.  Chemotherapy.  Total parenteral nutrition (TPN).  To take frequent blood samples.  To give IV fluids and blood products.  If there is difficulty placing a peripheral intravenous (PIV) catheter. If taken care of properly, a PICC can remain in place for several months. A PICC can also allow a person to go home from the hospital early. Medicine and PICC care can be managed at home by a family member or home health care team. [place] A PICC? Problems with a PICC can occasionally occur. These may include the following:  A blood clot (thrombus) forming in or at the tip of the PICC. This can cause the PICC to become clogged. A clot-dissolving medicine called tissue plasminogen activator (tPA) can be given through the PICC to help break up the clot.  Inflammation of the vein (phlebitis) in which the PICC is placed. Signs of inflammation may include redness, pain at the insertion site, red streaks, or being able to feel a "cord" in the vein where the PICC is located.  Infection in the PICC or at the insertion site. Signs of infection may include fever, chills, redness, swelling, or pus drainage from the PICC  insertion site.  PICC movement (malposition). The PICC tip may move from its original position due to excessive physical activity, forceful coughing, sneezing, or vomiting.  A break or cut in the PICC. It is important to not use scissors near the PICC.  Nerve or tendon irritation or injury during PICC insertion. WHAT SHOULD I KEEP IN MIND ABOUT ACTIVITIES WHEN I HAVE A PICC?  You may bend your arm and move it freely. If your PICC is near or at the bend of your elbow, avoid activity with repeated motion at the elbow.  Rest at home for the remainder of the day following PICC line insertion.  Avoid lifting heavy objects as instructed by your health care provider.  Avoid using a crutch with the arm on the same side as your PICC. You may need to use a walker. WHAT SHOULD I KNOW ABOUT MY PICC DRESSING?  Keep your PICC bandage (dressing) clean and dry to prevent infection.  Ask your health care provider when you may shower. Ask your health care provider to teach you how to wrap the PICC when you do take a shower.  Change the PICC dressing as instructed by your health care provider.  Change your PICC dressing if it becomes loose or wet. WHAT SHOULD I KNOW ABOUT PICC CARE?  Check the PICC insertion site daily for leakage, redness, swelling, or pain.  Do not take a bath, swim, or use hot tubs when you have a PICC. Cover PICC line with clear plastic wrap and tape to keep it dry while showering.  Flush the PICC as directed by your health care provider. Let your health care provider know right away if the PICC is difficult to flush or does not flush. Do not use force to flush the PICC.  Do not use a syringe that is less than 10 mL to flush the PICC.  Never pull or tug on the PICC.  Avoid blood pressure checks on the arm with the PICC.  Keep your PICC identification card with you at all times.  Do not take the PICC out yourself. Only a trained clinical professional should remove the  PICC. SEEK IMMEDIATE MEDICAL CARE IF:  Your PICC is accidentally pulled all the way  out. If this happens, cover the insertion site with a bandage or gauze dressing. Do not throw the PICC away. Your health care provider will need to inspect it.  Your PICC was tugged or pulled and has partially come out. Do not  push the PICC back in.  There is any type of drainage, redness, or swelling where the PICC enters the skin.  You cannot flush the PICC, it is difficult to flush, or the PICC leaks around the insertion site when it is flushed.  You hear a "flushing" sound when the PICC is flushed.  You have pain, discomfort, or numbness in your arm, shoulder, or jaw on the same side as the PICC.  You feel your heart "racing" or skipping beats.  You notice a hole or tear in the PICC.  You develop chills or a fever. MAKE SURE YOU:   Understand these instructions.  Will watch your condition.  Will get help right away if you are not doing well or get worse.   This information is not intended to replace advice given to you by your health care provider. Make sure you discuss any questions you have with your health care provider.   Document Released: 10/14/2002 Document Revised: 04/30/2014 Document Reviewed: 12/15/2012 Elsevier Interactive Patient Education [employer].

## 2015-02-03 ENCOUNTER — Ambulatory Visit (HOSPITAL_COMMUNITY)
Admission: RE | Admit: 2015-02-03 | Discharge: 2015-02-03 | Disposition: A | Discharge status: Home / Self Care | Payer: 59 | Source: Ambulatory Visit | Attending: Hematology & Oncology | Admitting: Hematology & Oncology

## 2015-02-03 ENCOUNTER — Other Ambulatory Visit: Payer: Self-pay

## 2015-02-03 DIAGNOSIS — C3490 Malignant neoplasm of unspecified part of unspecified bronchus or lung: Secondary | ICD-10-CM

## 2015-02-03 DIAGNOSIS — I714 Abdominal aortic aneurysm, without rupture, unspecified: Secondary | ICD-10-CM

## 2015-02-03 DIAGNOSIS — K8689 Other specified diseases of pancreas: Secondary | ICD-10-CM

## 2015-02-03 NOTE — Telephone Encounter (Signed)
EUS scheduled, pt instructed and medications reviewed.  Patient instructions mailed to home.  Patient to call with any questions or concerns.  

## 2015-02-03 NOTE — Progress Notes (Signed)
After assessing patient situation regarding attempted port insertion had concerns about trying to place a picc line being that the line would be going in the same area as the attempted port  would have. Xray showing large mass blocking where insertion would end. Spoke with [facility] picc nurses. Verified that could not place a mid line picc for the chemo medications patient would receive he would require a full picc. Spoke with dr Whitney Muse she is aware of situation said ok that patient could receive chemo through a peripheral IV and after tumor becomes smaller will place a porta cath

## 2015-02-03 NOTE — Consult Note (Signed)
Consult Note  Patient name: [name] MRN: [id number] DOB: 10-Feb-1961 Sex: male  Consulting Physician:  Dr. Servando Snare  Reason for Consult: No chief complaint on file.   HISTORY OF PRESENT ILLNESS: This is a 54 year old gentleman I was asked to see for evaluation of a abdominal aortic aneurysm.  This was recently detected on a PET scan to evaluate a thoracic mass.  The patient developed right rib pain several days ago and had a x-ray followed by CT scan which confirmed a right hilar mass.  The patient denies any abdominal discomfort.  EBUS has confirmed small cell lung cancer.  He is having a Port-A-Cath placed today.  PET scan also identified a pancreatic tail lesion.  This is scheduled to have biopsy performed in the immediate future.  The patient has a history of emergent CABG.  He is a long-term smoker and former heavy alcohol user.  His hypercholesterolemia is managed with a statin.  He is medically managed for hypertension.  Past Medical History  Diagnosis Date  . Coronary artery disease 2002    Lesion in LAD s/p angioplasty; s/p CABG x 4 Sept 2013  . Tobacco abuse   . Myocardial infarct [facility]) [id number]    stents  . Hyperlipidemia 1999  . Essential hypertension, benign 1999  . Lung mass   . COPD (chronic obstructive pulmonary disease) ([place])   . Shortness of breath dyspnea     with exertion, sleeps with more pillows under head  . Arthritis   . Pancreatitis   . Complication of anesthesia     woke up while being extubated and remembers fighting  . Cancer ([place])     lung  . Mass of pancreas     Past Surgical History  Procedure Laterality Date  . Knee surgery      left x 2  ACL repair  . Tee without cardioversion  01/02/2012    Procedure: TRANSESOPHAGEAL ECHOCARDIOGRAM (TEE);  Surgeon: [name], MD;  Location: [facility];  Service: Open Heart Surgery;  Laterality: N/A;  . Multiple extractions with alveoloplasty  02/21/2012    Procedure: MULTIPLE EXTRACION WITH  ALVEOLOPLASTY;  Surgeon: Lenn Cal, DDS;  Location: [facility];  Service: Oral Surgery;  Laterality: N/A;  Extaction of tooth #'s 4,5,6,7,10,11,12,13,14,15,19,20,21,22,23,24,25,   . Left heart catheterization with coronary angiogram N/A 01/01/2012    Procedure: LEFT HEART CATHETERIZATION WITH CORONARY ANGIOGRAM;  Surgeon: Hillary Bow, MD;  Location: [facility] CATH LAB;  Service: Cardiovascular;  Laterality: N/A;  . Coronary artery bypass graft  01/02/2012    Procedure: CORONARY ARTERY BYPASS GRAFTING (CABG);  Surgeon: [name], MD;  Location: [facility];  Service: Open Heart Surgery;  Laterality: N/A;  times three using left internal mammary artery and right endoscopically harvested saphenous vein  . Coronary angioplasty  1999, 2002  . Video bronchoscopy with endobronchial ultrasound N/A 01/26/2015    Procedure: VIDEO BRONCHOSCOPY WITH ENDOBRONCHIAL ULTRASOUND;  Surgeon: [name], MD;  Location: [facility];  Service: Thoracic;  Laterality: N/A;  . Lung biopsy N/A 01/26/2015    Procedure: LUNG BIOPSY;  Surgeon: [name], MD;  Location: [facility];  Service: Thoracic;  Laterality: N/A;  . Lymph node biopsy N/A 01/26/2015    Procedure: LYMPH NODE BIOPSY;  Surgeon: [name], MD;  Location: [facility];  Service: Thoracic;  Laterality: N/A;  . Portacath placement Left 02/01/2015    Procedure: ATTEMPTED INSERTION OF PORT-A-CATH;  Surgeon: Lilia Argue  Servando Snare, MD;  Location: [facility];  Service: Thoracic;  Laterality: Left;    Social History   Social History  . Marital Status: Married    Spouse Name: N/A  . Number of Children: 5  . Years of Education: N/A   Occupational History  . Works in [employer]  . Smoking status: Current Every Day Smoker -- 1.00 packs/day for 35 years    Types: Cigarettes    Start date: 06/30/1976  . Smokeless tobacco: Never Used  . Alcohol Use: Yes     Comment: maybe a 12 pack/mth if that - very rare since 2013  . Drug Use: No  .  Sexual Activity: Yes   Other Topics Concern  . Not on file   Social History Narrative    Family History  Problem Relation Age of Onset  . Cancer Neg Hx   . Heart disease Father   . Diabetes Father   . Hyperlipidemia Father   . Hypertension Father   . Hyperlipidemia Mother   . Hypertension Mother   . Stroke Mother   . Diabetes Paternal Grandmother     Allergies as of 01/31/2015 - Review Complete 01/31/2015  Allergen Reaction Noted  . Lisinopril Cough 11/27/2010  . Tea Hives 01/03/2012    No current facility-administered medications on file prior to encounter.   Current Outpatient Prescriptions on File Prior to Encounter  Medication Sig Dispense Refill  . amLODipine (NORVASC) 5 MG tablet TAKE ONE (1) TABLET BY MOUTH EVERY DAY 90 tablet 6  . aspirin EC 81 MG tablet Take 81 mg by mouth daily.    . carvedilol (COREG) 6.25 MG tablet TAKE ONE TABLET BY MOUTH TWICE A DAY WITH A MEAL 180 tablet 3  . HYDROcodone-acetaminophen (NORCO) 10-325 MG tablet Take 1 tablet by mouth every 6 (six) hours as needed. 90 tablet 0  . ibuprofen (ADVIL,MOTRIN) 200 MG tablet Take 600 mg by mouth every 6 (six) hours as needed for moderate pain.     . pravastatin (PRAVACHOL) 40 MG tablet TAKE ONE (1) TABLET BY MOUTH EVERY DAY 90 tablet 6     REVIEW OF SYSTEMS: See history of present illness was negative  PHYSICAL EXAMINATION: General: The patient appears their stated age.  Vital signs are BP 122/64 mmHg  Pulse 93  Temp(Src) 97 F (36.1 C) (Oral)  Resp 21  Ht '5\' 10"'$  (1.778 m)  Wt 220 lb 3.2 oz (99.882 kg)  BMI 31.60 kg/m2  SpO2 95% Pulmonary: Respirations are non-labored HEENT:  No gross abnormalities Abdomen: Soft and non-tender.  Pulsatile abdominal mass  Musculoskeletal: There are no major deformities.   Neurologic: No focal weakness or paresthesias are detected, Skin: There are no ulcer or rashes noted. Psychiatric: The patient has normal affect. Cardiovascular: There is a regular  rate and rhythm without significant murmur appreciated.  Palpable pedal pulses  Diagnostic Studies: I have reviewed his PET scan which shows a infrarenal 5.5-5.7 cm aneurysm.   Assessment:  Abdominal aortic aneurysm Plan: I discussed the PET scan findings with the patient and his wife.  We discussed the risk of rupture of this aneurysm over the course of the next year.  I would not recommend repair at this time as he needs to get started on treatment for his malignancy.  I would like to reevaluate him in 3 months with a CT angiogram to define his arterial anatomy and the appropriateness for endovascular repair.  At this point, I think  his cancer treatment needs to come first.  If he does have a good response then I would consider aneurysm repair at a later date.     Eldridge Abrahams, M.D. [facility] Office: [phone] Pager:  [phone]

## 2015-02-03 NOTE — Progress Notes (Signed)
Chemo teaching done and consent signed for Cisplatin & Etoposide. Distress screening not done. This will be done @ next visit.

## 2015-02-03 NOTE — Op Note (Signed)
[name], [name] NO.:  [id number]  MEDICAL RECORD NO.:  [id number]  LOCATION:  [facility]                         FACILITY:  [facility]  PHYSICIAN:  Lanelle Bal, MD    DATE OF BIRTH:  [birth year]  DATE OF PROCEDURE:  02/01/2015 DATE OF DISCHARGE:  02/01/2015                              OPERATIVE REPORT   PREOPERATIVE DIAGNOSIS:  Need for vascular access for chemotherapy.  POSTOPERATIVE DIAGNOSIS:  Need for vascular access for chemotherapy.  SURGICAL PROCEDURE:  Attempted placement of Port-A-Cath unsuccessful.  SURGEON:  Lanelle Bal, MD  BRIEF HISTORY:  The patient is a 54 year old male who was the week previously diagnosed with a large right hilar mass with some narrowing of the superior vena cava but without any definite superior vena caval symptoms.  A PET scan was performed and it also showed a 5.7 cm abdominal aortic aneurysm that is asymptomatic and a pancreatic mass. The patient was seen by Dr. Whitney Muse and placement of a Port-A-Cath was suggested to the patient for administration of chemotherapy.  The risks and options were discussed with the patient and he agreed and signed informed consent.  DESCRIPTION OF PROCEDURE:  Under MAC anesthesia, both the right and left chest was prepped with Betadine.  We started on the left side.  After appropriate time-out, 1% lidocaine was infiltrated in the infraclavicular area.  Using ultrasound guidance, the subclavian vein was located and a 16-gauge needle was introduced into the vein and under fluoroscopic guidance.  The guidewire was easily passed into the innominate vein.  However, in the mid to upper portion of the superior vena cava, we could never get the guidewire to go into the superior vena cava probably due to the degree of narrowing of the superior vena cava. At this point, since we now confirmed we could not position the catheter into the superior vena cava, then it would have to be left in  the innominate vein and the concern that the chemotherapy protocol would not use the Port-A-Cath, we did not place it and stopped the procedure.  The patient tolerated the procedure without obvious complication.  I discussed with Dr. Whitney Muse, use of PICC line or other noncentral access to obtain a site for chemotherapy administration.     Lanelle Bal, MD     EG/MEDQ  D:  02/02/2015  T:  02/02/2015  Job:  [id number]

## 2015-02-07 ENCOUNTER — Other Ambulatory Visit (HOSPITAL_COMMUNITY): Payer: Self-pay | Admitting: *Deleted

## 2015-02-07 ENCOUNTER — Other Ambulatory Visit (HOSPITAL_COMMUNITY): Payer: Self-pay | Admitting: Hematology & Oncology

## 2015-02-07 DIAGNOSIS — R918 Other nonspecific abnormal finding of lung field: Secondary | ICD-10-CM

## 2015-02-07 DIAGNOSIS — C3490 Malignant neoplasm of unspecified part of unspecified bronchus or lung: Secondary | ICD-10-CM | POA: Insufficient documentation

## 2015-02-07 DIAGNOSIS — C3491 Malignant neoplasm of unspecified part of right bronchus or lung: Secondary | ICD-10-CM

## 2015-02-07 DIAGNOSIS — K8689 Other specified diseases of pancreas: Secondary | ICD-10-CM

## 2015-02-07 MED ORDER — FENTANYL 25 MCG/HR TD PT72: 25.0000 ug | MEDICATED_PATCH | TRANSDERMAL | Status: DC

## 2015-02-08 ENCOUNTER — Encounter (HOSPITAL_COMMUNITY): Payer: Self-pay | Admitting: *Deleted

## 2015-02-09 ENCOUNTER — Telehealth: Payer: Self-pay | Admitting: Gastroenterology

## 2015-02-09 NOTE — Telephone Encounter (Signed)
Left message for pt to call back.  Pt instructed to arrive at the hospital at 11:45am and that procedure would last about an hour.

## 2015-02-10 ENCOUNTER — Ambulatory Visit (HOSPITAL_COMMUNITY): Payer: 59 | Admitting: Registered Nurse

## 2015-02-10 ENCOUNTER — Encounter (HOSPITAL_COMMUNITY)
Admission: RE | Disposition: A | Discharge status: Home / Self Care | Payer: Self-pay | Source: Ambulatory Visit | Attending: Gastroenterology

## 2015-02-10 ENCOUNTER — Ambulatory Visit (HOSPITAL_COMMUNITY)
Admission: RE | Admit: 2015-02-10 | Discharge: 2015-02-10 | Disposition: A | Discharge status: Home / Self Care | Payer: 59 | Source: Ambulatory Visit | Attending: Gastroenterology | Admitting: Gastroenterology

## 2015-02-10 ENCOUNTER — Encounter (HOSPITAL_COMMUNITY): Payer: Self-pay | Admitting: Gastroenterology

## 2015-02-10 ENCOUNTER — Telehealth: Payer: Self-pay | Admitting: Surgery

## 2015-02-10 DIAGNOSIS — Z91018 Allergy to other foods: Secondary | ICD-10-CM | POA: Insufficient documentation

## 2015-02-10 DIAGNOSIS — F17210 Nicotine dependence, cigarettes, uncomplicated: Secondary | ICD-10-CM | POA: Diagnosis not present

## 2015-02-10 DIAGNOSIS — K869 Disease of pancreas, unspecified: Secondary | ICD-10-CM | POA: Diagnosis not present

## 2015-02-10 DIAGNOSIS — Z888 Allergy status to other drugs, medicaments and biological substances status: Secondary | ICD-10-CM | POA: Insufficient documentation

## 2015-02-10 DIAGNOSIS — I2510 Atherosclerotic heart disease of native coronary artery without angina pectoris: Secondary | ICD-10-CM | POA: Diagnosis not present

## 2015-02-10 DIAGNOSIS — C3490 Malignant neoplasm of unspecified part of unspecified bronchus or lung: Secondary | ICD-10-CM | POA: Diagnosis not present

## 2015-02-10 DIAGNOSIS — E669 Obesity, unspecified: Secondary | ICD-10-CM | POA: Insufficient documentation

## 2015-02-10 DIAGNOSIS — Z7982 Long term (current) use of aspirin: Secondary | ICD-10-CM | POA: Diagnosis not present

## 2015-02-10 DIAGNOSIS — J449 Chronic obstructive pulmonary disease, unspecified: Secondary | ICD-10-CM | POA: Diagnosis not present

## 2015-02-10 DIAGNOSIS — Z951 Presence of aortocoronary bypass graft: Secondary | ICD-10-CM | POA: Insufficient documentation

## 2015-02-10 DIAGNOSIS — I714 Abdominal aortic aneurysm, without rupture: Secondary | ICD-10-CM | POA: Insufficient documentation

## 2015-02-10 DIAGNOSIS — I10 Essential (primary) hypertension: Secondary | ICD-10-CM | POA: Insufficient documentation

## 2015-02-10 DIAGNOSIS — I252 Old myocardial infarction: Secondary | ICD-10-CM | POA: Insufficient documentation

## 2015-02-10 DIAGNOSIS — Z6831 Body mass index (BMI) 31.0-31.9, adult: Secondary | ICD-10-CM | POA: Insufficient documentation

## 2015-02-10 DIAGNOSIS — K8689 Other specified diseases of pancreas: Secondary | ICD-10-CM | POA: Insufficient documentation

## 2015-02-10 DIAGNOSIS — E785 Hyperlipidemia, unspecified: Secondary | ICD-10-CM | POA: Diagnosis not present

## 2015-02-10 HISTORY — PX: EUS: SHX5427

## 2015-02-10 SURGERY — UPPER ENDOSCOPIC ULTRASOUND (EUS) LINEAR
Anesthesia: Monitor Anesthesia Care

## 2015-02-10 MED ORDER — PROPOFOL 10 MG/ML IV BOLUS
INTRAVENOUS | Status: AC
  Filled 2015-02-10: qty 20

## 2015-02-10 MED ORDER — SODIUM CHLORIDE 0.9 % IV SOLN: INTRAVENOUS | Status: DC

## 2015-02-10 MED ORDER — PROPOFOL 10 MG/ML IV BOLUS
INTRAVENOUS | Status: DC | PRN
  Administered 2015-02-10: 10 mg via INTRAVENOUS

## 2015-02-10 MED ORDER — LACTATED RINGERS IV SOLN
INTRAVENOUS | Status: DC | PRN
  Administered 2015-02-10: 12:00:00 via INTRAVENOUS

## 2015-02-10 MED ORDER — LIDOCAINE HCL (CARDIAC) 20 MG/ML IV SOLN
INTRAVENOUS | Status: DC | PRN
  Administered 2015-02-10: 50 mg via INTRAVENOUS

## 2015-02-10 MED ORDER — BUTAMBEN-TETRACAINE-BENZOCAINE 2-2-14 % EX AERO
INHALATION_SPRAY | CUTANEOUS | Status: DC | PRN
  Administered 2015-02-10: 2 via TOPICAL

## 2015-02-10 MED ORDER — PROPOFOL 500 MG/50ML IV EMUL
INTRAVENOUS | Status: DC | PRN
  Administered 2015-02-10: 130 ug/kg/min via INTRAVENOUS

## 2015-02-10 MED ORDER — LIDOCAINE HCL (CARDIAC) 20 MG/ML IV SOLN
INTRAVENOUS | Status: AC
  Filled 2015-02-10: qty 5

## 2015-02-10 NOTE — Anesthesia Postprocedure Evaluation (Signed)
  Anesthesia Post-op Note  Patient: [name]  Procedure(s) Performed: Procedure(s) (LRB): UPPER ENDOSCOPIC ULTRASOUND (EUS) LINEAR (N/A)  Patient Location: PACU  Anesthesia Type: MAC  Level of Consciousness: awake and alert   Airway and Oxygen Therapy: Patient Spontanous Breathing  Post-op Pain: mild  Post-op Assessment: Post-op Vital signs reviewed, Patient's Cardiovascular Status Stable, Respiratory Function Stable, Patent Airway and No signs of Nausea or vomiting  Last Vitals:  Filed Vitals:   02/10/15 1335  BP: 121/71  Pulse: 84  Temp:   Resp: 21    Post-op Vital Signs: stable   Complications: No apparent anesthesia complications

## 2015-02-10 NOTE — Anesthesia Procedure Notes (Signed)
Procedure Name: MAC Date/Time: 02/10/2015 12:16 PM Performed by: Carleene Cooper A Pre-anesthesia Checklist: Patient identified, Timeout performed, Emergency Drugs available, Suction available and Patient being monitored Oxygen Delivery Method: Nasal cannula Dental Injury: Teeth and Oropharynx as per pre-operative assessment

## 2015-02-10 NOTE — Telephone Encounter (Signed)
-----   Message from Denman George, RN sent at 02/03/2015  9:06 AM EDT ----- Regarding: Zigmund Daniel log; also needs 3 mo. CTA chest,abd, pelvis and f/u with [facility]   ----- Message -----    From: Serafina Mitchell, MD    Sent: 02/03/2015   8:02 AM      To: [facility] Charge Pool  02/01/2015:  Level for consult for abdominal aortic aneurysm  Please schedule patient for follow-up in 3 months with a CT angiogram of the chest abdomen and pelvis

## 2015-02-10 NOTE — H&P (View-Only) (Signed)
Consult Note  Patient name: [name] MRN: [id number] DOB: [birth year] Sex: male  Consulting Physician:  Dr. Servando Snare  Reason for Consult: No chief complaint on file.   HISTORY OF PRESENT ILLNESS: This is a 55 year old gentleman I was asked to see for evaluation of a abdominal aortic aneurysm.  This was recently detected on a PET scan to evaluate a thoracic mass.  The patient developed right rib pain several days ago and had a x-ray followed by CT scan which confirmed a right hilar mass.  The patient denies any abdominal discomfort.  EBUS has confirmed small cell lung cancer.  He is having a Port-A-Cath placed today.  PET scan also identified a pancreatic tail lesion.  This is scheduled to have biopsy performed in the immediate future.  The patient has a history of emergent CABG.  He is a long-term smoker and former heavy alcohol user.  His hypercholesterolemia is managed with a statin.  He is medically managed for hypertension.  Past Medical History  Diagnosis Date  . Coronary artery disease 2002    Lesion in LAD s/p angioplasty; s/p CABG x 4 Sept 2013  . Tobacco abuse   . Myocardial infarct [facility]) [id number]    stents  . Hyperlipidemia 1999  . Essential hypertension, benign 1999  . Lung mass   . COPD (chronic obstructive pulmonary disease) ([place])   . Shortness of breath dyspnea     with exertion, sleeps with more pillows under head  . Arthritis   . Pancreatitis   . Complication of anesthesia     woke up while being extubated and remembers fighting  . Cancer ([place])     lung  . Mass of pancreas     Past Surgical History  Procedure Laterality Date  . Knee surgery      left x 2  ACL repair  . Tee without cardioversion  01/02/2012    Procedure: TRANSESOPHAGEAL ECHOCARDIOGRAM (TEE);  Surgeon: [name], MD;  Location: [facility];  Service: Open Heart Surgery;  Laterality: N/A;  . Multiple extractions with alveoloplasty  02/21/2012    Procedure: MULTIPLE EXTRACION WITH  ALVEOLOPLASTY;  Surgeon: Lenn Cal, DDS;  Location: [facility];  Service: Oral Surgery;  Laterality: N/A;  Extaction of tooth #'s 4,5,6,7,10,11,12,13,14,15,19,20,21,22,23,24,25,   . Left heart catheterization with coronary angiogram N/A 01/01/2012    Procedure: LEFT HEART CATHETERIZATION WITH CORONARY ANGIOGRAM;  Surgeon: Hillary Bow, MD;  Location[facility] CATH LAB;  Service: Cardiovascular;  Laterality: N/A;  . Coronary artery bypass graft  01/02/2012    Procedure: CORONARY ARTERY BYPASS GRAFTING (CABG);  Surgeon: [name], MD;  Location: [facility];  Service: Open Heart Surgery;  Laterality: N/A;  times three using left internal mammary artery and right endoscopically harvested saphenous vein  . Coronary angioplasty  1999, 2002  . Video bronchoscopy with endobronchial ultrasound N/A 01/26/2015    Procedure: VIDEO BRONCHOSCOPY WITH ENDOBRONCHIAL ULTRASOUND;  Surgeon: [name], MD;  Location: [facility];  Service: Thoracic;  Laterality: N/A;  . Lung biopsy N/A 01/26/2015    Procedure: LUNG BIOPSY;  Surgeon: [name], MD;  Location: [facility];  Service: Thoracic;  Laterality: N/A;  . Lymph node biopsy N/A 01/26/2015    Procedure: LYMPH NODE BIOPSY;  Surgeon: [name], MD;  Location: [facility];  Service: Thoracic;  Laterality: N/A;  . Portacath placement Left 02/01/2015    Procedure: ATTEMPTED INSERTION OF PORT-A-CATH;  Surgeon: Lilia Argue  Servando Snare, MD;  Location: [facility];  Service: Thoracic;  Laterality: Left;    Social History   Social History  . Marital Status: Married    Spouse Name: N/A  . Number of Children: 5  . Years of Education: N/A   Occupational History  . Works in [employer]  . Smoking status: Current Every Day Smoker -- 1.00 packs/day for 35 years    Types: Cigarettes    Start date: 06/30/1976  . Smokeless tobacco: Never Used  . Alcohol Use: Yes     Comment: maybe a 12 pack/mth if that - very rare since 2013  . Drug Use: No  .  Sexual Activity: Yes   Other Topics Concern  . Not on file   Social History Narrative    Family History  Problem Relation Age of Onset  . Cancer Neg Hx   . Heart disease Father   . Diabetes Father   . Hyperlipidemia Father   . Hypertension Father   . Hyperlipidemia Mother   . Hypertension Mother   . Stroke Mother   . Diabetes Paternal Grandmother     Allergies as of 01/31/2015 - Review Complete 01/31/2015  Allergen Reaction Noted  . Lisinopril Cough 11/27/2010  . Tea Hives 01/03/2012    No current facility-administered medications on file prior to encounter.   Current Outpatient Prescriptions on File Prior to Encounter  Medication Sig Dispense Refill  . amLODipine (NORVASC) 5 MG tablet TAKE ONE (1) TABLET BY MOUTH EVERY DAY 90 tablet 6  . aspirin EC 81 MG tablet Take 81 mg by mouth daily.    . carvedilol (COREG) 6.25 MG tablet TAKE ONE TABLET BY MOUTH TWICE A DAY WITH A MEAL 180 tablet 3  . HYDROcodone-acetaminophen (NORCO) 10-325 MG tablet Take 1 tablet by mouth every 6 (six) hours as needed. 90 tablet 0  . ibuprofen (ADVIL,MOTRIN) 200 MG tablet Take 600 mg by mouth every 6 (six) hours as needed for moderate pain.     . pravastatin (PRAVACHOL) 40 MG tablet TAKE ONE (1) TABLET BY MOUTH EVERY DAY 90 tablet 6     REVIEW OF SYSTEMS: See history of present illness was negative  PHYSICAL EXAMINATION: General: The patient appears their stated age.  Vital signs are BP 122/64 mmHg  Pulse 93  Temp(Src) 97 F (36.1 C) (Oral)  Resp 21  Ht '5\' 10"'$  (1.778 m)  Wt 220 lb 3.2 oz (99.882 kg)  BMI 31.60 kg/m2  SpO2 95% Pulmonary: Respirations are non-labored HEENT:  No gross abnormalities Abdomen: Soft and non-tender.  Pulsatile abdominal mass  Musculoskeletal: There are no major deformities.   Neurologic: No focal weakness or paresthesias are detected, Skin: There are no ulcer or rashes noted. Psychiatric: The patient has normal affect. Cardiovascular: There is a regular  rate and rhythm without significant murmur appreciated.  Palpable pedal pulses  Diagnostic Studies: I have reviewed his PET scan which shows a infrarenal 5.5-5.7 cm aneurysm.   Assessment:  Abdominal aortic aneurysm Plan: I discussed the PET scan findings with the patient and his wife.  We discussed the risk of rupture of this aneurysm over the course of the next year.  I would not recommend repair at this time as he needs to get started on treatment for his malignancy.  I would like to reevaluate him in 3 months with a CT angiogram to define his arterial anatomy and the appropriateness for endovascular repair.  At this point, I think  his cancer treatment needs to come first.  If he does have a good response then I would consider aneurysm repair at a later date.     Eldridge Abrahams, M.D. [facility] Office: [phone] Pager:  [phone]

## 2015-02-10 NOTE — Discharge Instructions (Signed)

## 2015-02-10 NOTE — Anesthesia Preprocedure Evaluation (Addendum)
Anesthesia Evaluation  Patient identified by MRN, date of birth, ID band Patient awake    Reviewed: Allergy & Precautions, H&P , NPO status , Patient's Chart, lab work & pertinent test results, reviewed documented beta blocker date and time   History of Anesthesia Complications Negative for: history of anesthetic complications  Airway Mallampati: I  TM Distance: >3 FB Neck ROM: Full    Dental  (+) Edentulous Upper, Edentulous Lower, Dental Advisory Given   Pulmonary shortness of breath and with exertion, COPD, Current Smoker,  Lung cancer. 87% saturation on RA    + decreased breath sounds+ wheezing      Cardiovascular hypertension, Pt. on medications and Pt. on home beta blockers + CAD, + Past MI, + Cardiac Stents, + CABG and + Peripheral Vascular Disease (AAA: unrepaired)  Normal cardiovascular exam Rhythm:Regular Rate:Normal  '12 stress: EF 55%, no ischemai   Neuro/Psych negative neurological ROS  negative psych ROS   GI/Hepatic negative GI ROS, Neg liver ROS, Pancreatic mass   Endo/Other  negative endocrine ROS  Renal/GU negative Renal ROS  negative genitourinary   Musculoskeletal   Abdominal (+) + obese,   Peds  Hematology negative hematology ROS (+)   Anesthesia Other Findings   Reproductive/Obstetrics negative OB ROS                          Anesthesia Physical Anesthesia Plan  ASA: III  Anesthesia Plan: MAC   Post-op Pain Management:    Induction:   Airway Management Planned: Nasal Cannula and Natural Airway  Additional Equipment:   Intra-op Plan:   Post-operative Plan:   Informed Consent: I have reviewed the patients History and Physical, chart, labs and discussed the procedure including the risks, benefits and alternatives for the proposed anesthesia with the patient or authorized representative who has indicated his/her understanding and acceptance.   Dental Advisory  Given  Plan Discussed with: CRNA and Surgeon  Anesthesia Plan Comments:        Anesthesia Quick Evaluation

## 2015-02-10 NOTE — Telephone Encounter (Signed)
Sent detailed CT letter to home address, dpm

## 2015-02-10 NOTE — Op Note (Signed)
[facility] [place], [id number]   ENDOSCOPIC ULTRASOUND PROCEDURE REPORT  PATIENT: [name], [name]  MR#: [id number] BIRTHDATE: [birth year]  GENDER: male ENDOSCOPIST: [name], MD REFERRED BY:  Ancil Linsey, MD PROCEDURE DATE:  02/10/2015 PROCEDURE:   Upper EUS w/FNA ASA CLASS:      Class IV INDICATIONS:   1.  known small cell lung cancer, also tail of pancreas mass. MEDICATIONS: Monitored anesthesia care  DESCRIPTION OF PROCEDURE:   After the risks benefits and alternatives of the procedure were  explained, informed consent was obtained. The patient was then placed in the left, lateral, decubitus postion and IV sedation was administered. Throughout the procedure, the patients blood pressure, pulse and oxygen saturations were monitored continuously.  Under direct visualization, the Pentax Radial EUS [id number]  endoscope was introduced through the mouth  and advanced to the second portion of the duodenum .  Water was used as necessary to provide an acoustic interface.  Upon completion of the imaging, water was removed and the patient was sent to the recovery room in satisfactory condition.    Endoscopic findings (with radial and linear echoendoscope): 1. Essentially normal UGI tract  EUS findings: 1. 4.5cm hypoechoic, solid mass in the tail of pancreas with irregular outer margins. The mass does not involve the celiac trunk, SMA, SMV or portal vein.  The mass directly abuts the splenic artery and vein, with likely invasion.  The mass was sampled with three transgastric passes with a 25 gauge EUS FNA needle, suction. 2. Two round, hypoechoic, peripancreatic lympnodes very nearby the tail of pancreas mass; measuring 5-69m across. 3. The pancreatic parenchyma was otherwise normal. 4. Limited views of liver, spleen were all normal.  ENDOSCOPIC IMPRESSION: 4.5cm solid, irregularly bordered mass in the tail of pancreas. Preliminary cytology  review is NOT consistent with pancreatic adenocarcinoma. 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 Await final cytology review.  RECOMMENDATIONS: Await final cytology review.  _______________________________ eSigned:[name] MD 02/10/2015 1:10 PM

## 2015-02-10 NOTE — Interval H&P Note (Signed)
History and Physical Interval Note:  02/10/2015 11:29 AM  [name]  has presented today for surgery, with the diagnosis of pancreatic mass, lung cancer  The various methods of treatment have been discussed with the patient and family. After consideration of risks, benefits and other options for treatment, the patient has consented to  Procedure(s): UPPER ENDOSCOPIC ULTRASOUND (EUS) LINEAR (N/A) as a surgical intervention .  The patient's history has been reviewed, patient examined, no change in status, stable for surgery.  I have reviewed the patient's chart and labs.  Questions were answered to the patient's satisfaction.     [name]

## 2015-02-10 NOTE — Transfer of Care (Signed)
Immediate Anesthesia Transfer of Care Note  Patient: [name]  Procedure(s) Performed: Procedure(s): UPPER ENDOSCOPIC ULTRASOUND (EUS) LINEAR (N/A)  Patient Location: PACU and Endoscopy Unit  Anesthesia Type:MAC  Level of Consciousness: awake, alert , oriented and patient cooperative  Airway & Oxygen Therapy: Patient Spontanous Breathing and Patient connected to nasal cannula oxygen  Post-op Assessment: Report given to RN, Post -op Vital signs reviewed and stable and Patient moving all extremities  Post vital signs: Reviewed and stable  Last Vitals:  Filed Vitals:   02/10/15 1140  BP:   Pulse:   Temp: 37.3 C  Resp:     Complications: No apparent anesthesia complications

## 2015-02-11 ENCOUNTER — Encounter (HOSPITAL_COMMUNITY): Payer: Self-pay | Admitting: Gastroenterology

## 2015-02-14 ENCOUNTER — Encounter (HOSPITAL_BASED_OUTPATIENT_CLINIC_OR_DEPARTMENT_OTHER): Payer: 59 | Admitting: Hematology & Oncology

## 2015-02-14 ENCOUNTER — Encounter (HOSPITAL_BASED_OUTPATIENT_CLINIC_OR_DEPARTMENT_OTHER): Payer: 59

## 2015-02-14 VITALS — BP 116/92 | HR 92 | Temp 98.0°F | Resp 18 | Wt 219.2 lb

## 2015-02-14 DIAGNOSIS — Z0000 Encounter for general adult medical examination without abnormal findings: Secondary | ICD-10-CM | POA: Diagnosis not present

## 2015-02-14 DIAGNOSIS — Z5111 Encounter for antineoplastic chemotherapy: Secondary | ICD-10-CM

## 2015-02-14 DIAGNOSIS — C3411 Malignant neoplasm of upper lobe, right bronchus or lung: Secondary | ICD-10-CM

## 2015-02-14 DIAGNOSIS — C3491 Malignant neoplasm of unspecified part of right bronchus or lung: Secondary | ICD-10-CM

## 2015-02-14 DIAGNOSIS — R918 Other nonspecific abnormal finding of lung field: Secondary | ICD-10-CM | POA: Diagnosis present

## 2015-02-14 DIAGNOSIS — Z720 Tobacco use: Secondary | ICD-10-CM

## 2015-02-14 DIAGNOSIS — K869 Disease of pancreas, unspecified: Secondary | ICD-10-CM | POA: Diagnosis present

## 2015-02-14 DIAGNOSIS — R05 Cough: Secondary | ICD-10-CM | POA: Diagnosis not present

## 2015-02-14 LAB — CBC WITH DIFFERENTIAL/PLATELET
BASOS ABS: 0 10*3/uL (ref 0.0–0.1)
BASOS PCT: 0 %
Eosinophils Absolute: 0.1 10*3/uL (ref 0.0–0.7)
Eosinophils Relative: 1 %
HEMATOCRIT: 30.7 % — AB (ref 39.0–52.0)
HEMOGLOBIN: 10.1 g/dL — AB (ref 13.0–17.0)
Lymphocytes Relative: 12 %
Lymphs Abs: 1.4 10*3/uL (ref 0.7–4.0)
MCH: 28.9 pg (ref 26.0–34.0)
MCHC: 32.9 g/dL (ref 30.0–36.0)
MCV: 87.7 fL (ref 78.0–100.0)
Monocytes Absolute: 1.3 10*3/uL — ABNORMAL HIGH (ref 0.1–1.0)
Monocytes Relative: 11 %
NEUTROS ABS: 8.4 10*3/uL — AB (ref 1.7–7.7)
NEUTROS PCT: 76 %
Platelets: 254 10*3/uL (ref 150–400)
RBC: 3.5 MIL/uL — AB (ref 4.22–5.81)
RDW: 14.1 % (ref 11.5–15.5)
WBC: 11.1 10*3/uL — AB (ref 4.0–10.5)

## 2015-02-14 LAB — COMPREHENSIVE METABOLIC PANEL
ALBUMIN: 2.9 g/dL — AB (ref 3.5–5.0)
ALK PHOS: 49 U/L (ref 38–126)
ALT: 15 U/L — AB (ref 17–63)
AST: 21 U/L (ref 15–41)
Anion gap: 9 (ref 5–15)
BUN: 10 mg/dL (ref 6–20)
CO2: 27 mmol/L (ref 22–32)
Calcium: 8.2 mg/dL — ABNORMAL LOW (ref 8.9–10.3)
Chloride: 90 mmol/L — ABNORMAL LOW (ref 101–111)
Creatinine, Ser: 0.96 mg/dL (ref 0.61–1.24)
GFR calc Af Amer: >60 mL/min (ref >60)
GFR calc non Af Amer: >60 mL/min (ref >60)
GLUCOSE: 128 mg/dL — AB (ref 65–99)
POTASSIUM: 3.8 mmol/L (ref 3.5–5.1)
Sodium: 126 mmol/L — ABNORMAL LOW (ref 135–145)
Total Bilirubin: 1.3 mg/dL — ABNORMAL HIGH (ref 0.3–1.2)
Total Protein: 7.2 g/dL (ref 6.5–8.1)

## 2015-02-14 MED ORDER — SODIUM CHLORIDE 0.9 % IV SOLN
Freq: Once | INTRAVENOUS | Status: AC
  Administered 2015-02-14: 11:00:00 via INTRAVENOUS
  Filled 2015-02-14: qty 5

## 2015-02-14 MED ORDER — LEVOFLOXACIN 500 MG PO TABS: ORAL_TABLET | ORAL | Status: DC

## 2015-02-14 MED ORDER — SODIUM CHLORIDE 0.9 % IV SOLN
Freq: Once | INTRAVENOUS | Status: AC
  Administered 2015-02-14: 10:00:00 via INTRAVENOUS

## 2015-02-14 MED ORDER — ETOPOSIDE CHEMO INJECTION 1 GM/50ML
100.0000 mg/m2 | Freq: Once | INTRAVENOUS | Status: AC
  Administered 2015-02-14: 220 mg via INTRAVENOUS
  Filled 2015-02-14: qty 11

## 2015-02-14 MED ORDER — PALONOSETRON HCL INJECTION 0.25 MG/5ML
0.2500 mg | Freq: Once | INTRAVENOUS | Status: AC
  Administered 2015-02-14: 0.25 mg via INTRAVENOUS
  Filled 2015-02-14: qty 5

## 2015-02-14 MED ORDER — LEVOFLOXACIN IN D5W 500 MG/100ML IV SOLN
500.0000 mg | INTRAVENOUS | Status: DC
  Administered 2015-02-14: 500 mg via INTRAVENOUS
  Filled 2015-02-14 (×2): qty 100

## 2015-02-14 MED ORDER — SODIUM CHLORIDE 0.9 % IV SOLN
80.0000 mg/m2 | Freq: Once | INTRAVENOUS | Status: AC
  Administered 2015-02-14: 178 mg via INTRAVENOUS
  Filled 2015-02-14: qty 178

## 2015-02-14 MED ORDER — POTASSIUM CHLORIDE 2 MEQ/ML IV SOLN
Freq: Once | INTRAVENOUS | Status: AC
  Administered 2015-02-14: 09:00:00 via INTRAVENOUS
  Filled 2015-02-14: qty 10

## 2015-02-14 MED ORDER — FENTANYL 50 MCG/HR TD PT72: 50.0000 ug | MEDICATED_PATCH | TRANSDERMAL | Status: DC

## 2015-02-14 NOTE — Progress Notes (Signed)
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    01/18/2015 Imaging CT chest- 8.7 x 8.7 cm R hilar mass encasing the R pulm artery and occluding the R upper lobe bronchus. Sm R pleural effusion; malignant or reactive. 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 34 mm x 26 mm mass in body of pancreas   01/21/2015 Imaging MRI brain- No acute intracranial findings. No signs of metastatic disease. Minor white matter disease, stable and nonspecific.   01/26/2015 Pathology Results Lung, biopsy, right upper lobe - SMALL CELL CARCINOMA.   01/26/2015 Pathology Results Diagnosis FINE NEEDLE ASPIRATION: ENDOSCOPIC SPECIMEN A, EBUS LEVEL 7 NODE (SPECIMEN 1 OF 3 COLLECTED 01/26/2015) MALIGNANT CELLS PRESENT, CONSISTENT WITH SMALL CELL CARCINOMA.   01/26/2015 Pathology Results WANG NEEDLE ASPIRATION, SPECIMEN B LEVEL 7 NODE (SPECIMEN 2 OF 3 COLLECTED 01/26/2015) MALIGNANT CELLS PRESENT, CONSISTENT WITH SMALL CELL CARCINOMA.   01/26/2015  Pathology Results Diagnosis BRONCHIAL BRUSHING SPECIMEN C, RIGHT UPPER LOBE (SPECIMEN 3 OF 3 COLLECTED 01/26/2015) ATYPICAL CELLS PRESENT.   01/27/2015 PET scan Large hypermetabolic right hilar mass with mediastinal invasion and infrahilar extension, maximum standard uptake value 13.0.  Pancreatic tail mass, approximately 4.5 cm in diameter. Abdominal aortic aneurysm, 5.8 cm in diameter.   02/10/2015 Pathology Results Pancreatic biopsy via EUS:    02/14/2015 - 03/06/2015 Chemotherapy Cisplatin/Etoposide               HISTORY OF PRESENTING ILLNESS:  [name] Graves89.o. male is here on referral from the ED for lung cancer. He is here today with his wife, ShRachel [name]   [name] accompanied by his wife today. He has been sleeping most of the past few days, stating that it is easier to stop smoking by sleeping through it. The patient reports that he last smoked about 2-3 cigarettes Saturday night and has not smoked since.  The patient states that he is still coughing up "the old white stuff". The only other symptom he has noticed was that he felt cold yesterday.   He is here today for his first cycle of chemotherapy. He will start cisplatin/etoposide. Final pathology regarding the pancreatic biopsy is not in the chart if positive for SCLC, we will change therapy to carboplatin/etoposide for extensive stage disease.  His mood is ok. He is still quite emotional. He is anxious to proceed with treatment with hopes of improving his breathing soon.   MEDICAL HISTORY:  Past Medical History  Diagnosis Date  . Coronary artery disease 2002    Lesion in LAD s/p angioplasty;  s/p CABG x 4 Sept 2013  . Tobacco abuse   . Myocardial infarct [facility]) [id number]    stents  . Hyperlipidemia 1999  . Essential hypertension, benign 1999  . Lung mass   . COPD (chronic obstructive pulmonary disease) ([place])   . Shortness of breath dyspnea     with exertion, sleeps with more pillows under head  . Arthritis   .  Pancreatitis   . Complication of anesthesia     woke up while being extubated and remembers fighting  . Cancer ([place])     lung  . Mass of pancreas     SURGICAL HISTORY: Past Surgical History  Procedure Laterality Date  . Knee surgery      left x 2  ACL repair  . Tee without cardioversion  01/02/2012    Procedure: TRANSESOPHAGEAL ECHOCARDIOGRAM (TEE);  Surgeon: [name], MD;  Location: [place];  Service: Open Heart Surgery;  Laterality: N/A;  . Multiple extractions with alveoloplasty  02/21/2012    Procedure: MULTIPLE EXTRACION WITH ALVEOLOPLASTY;  Surgeon: Lenn Cal, DDS;  Location: [facility];  Service: Oral Surgery;  Laterality: N/A;  Extaction of tooth #'s 4,5,6,7,10,11,12,13,14,15,19,20,21,22,23,24,25,   . Left heart catheterization with coronary angiogram N/A 01/01/2012    Procedure: LEFT HEART CATHETERIZATION WITH CORONARY ANGIOGRAM;  Surgeon: Hillary Bow, MD;  Location: [facility] CATH LAB;  Service: Cardiovascular;  Laterality: N/A;  . Coronary artery bypass graft  01/02/2012    Procedure: CORONARY ARTERY BYPASS GRAFTING (CABG);  Surgeon: [name], MD;  Location: [place];  Service: Open Heart Surgery;  Laterality: N/A;  times three using left internal mammary artery and right endoscopically harvested saphenous vein  . Coronary angioplasty  1999, 2002  . Video bronchoscopy with endobronchial ultrasound N/A 01/26/2015    Procedure: VIDEO BRONCHOSCOPY WITH ENDOBRONCHIAL ULTRASOUND;  Surgeon: [name], MD;  Location: [place];  Service: Thoracic;  Laterality: N/A;  . Lung biopsy N/A 01/26/2015    Procedure: LUNG BIOPSY;  Surgeon: [name], MD;  Location: [place];  Service: Thoracic;  Laterality: N/A;  . Lymph node biopsy N/A 01/26/2015    Procedure: LYMPH NODE BIOPSY;  Surgeon: [name], MD;  Location: [place];  Service: Thoracic;  Laterality: N/A;  . Portacath placement Left 02/01/2015    Procedure: ATTEMPTED INSERTION OF PORT-A-CATH;  Surgeon: [name], MD;  Location: [place];  Service: Thoracic;  Laterality: Left;  . Eus N/A 02/10/2015    Procedure: UPPER ENDOSCOPIC ULTRASOUND (EUS) LINEAR;  Surgeon: [name], MD;  [facility];  Service: Endoscopy;  Laterality: N/A;    SOCIAL HISTORY: Social History   Social History  . Marital Status: Married    Spouse Name: N/A  . Number of Children: 5  . Years of Education: N/A   Occupational History  . Works in [place] [employer]  . Smoking status: Current Every Day Smoker -- 1.00 packs/day for 35 years    Types: Cigarettes    Start date: 06/30/1976  . Smokeless tobacco: Never Used  . Alcohol Use: Yes     Comment: maybe a 12 pack/mth if that - very rare since 2013  . Drug Use: No  . Sexual Activity: Yes   Other Topics Concern  . Not on file   Social History Narrative   Darral Dash been married 17 years, with five kids.  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 Cancer Neg Hx   . Heart disease Father   . Diabetes Father   . Hyperlipidemia Father   . Hypertension Father   . Hyperlipidemia Mother   . Hypertension Mother   . Stroke Mother   . Diabetes Paternal Grandmother    indicated that his mother is alive. He indicated that his father is alive. He indicated that his sister is alive. He indicated that his brother is alive. He indicated that his maternal grandmother is deceased. He indicated that his maternal grandfather is deceased. He indicated that his paternal grandmother is deceased. He indicated that his paternal grandfather is deceased.     His father's somewhere in [place]; his mother lives here in [place] and works at [employer] 76 at the end of this month She's got high blood pressure, high cholesterol, no strokes that they know of; is on blood thinners Has an aneurism at the base of the aorta where it goes into your stomach He knows his father's had high blood pressure and diabetes; Parents have been divorced since he was 3-4 Has half brothers / half sisters   ALLERGIES:  is allergic to lisinopril and tea.  MEDICATIONS:  Current Outpatient Prescriptions  Medication Sig Dispense Refill  . amLODipine (NORVASC) 5 MG tablet TAKE ONE (1) TABLET BY MOUTH EVERY DAY (Patient taking differently: TAKE ONE (1) TABLET BY MOUTH AT BEDTIME) 90 tablet 6  . aspirin EC 81 MG tablet Take 81 mg by mouth daily.    . carvedilol (COREG) 6.25 MG tablet TAKE ONE TABLET BY MOUTH TWICE A DAY WITH A MEAL 180 tablet 3  . fentaNYL (DURAGESIC - DOSED MCG/HR) 25 MCG/HR patch Place 1 patch (25 mcg total) onto the skin every 3 (three) days. May increase to 2 patches after 72 hours if pain is not improved. 5 patch 0  . HYDROcodone-acetaminophen (NORCO) 10-325 MG tablet Take 1 tablet by mouth every 6 (six) hours as needed. 90 tablet 0  . ibuprofen (ADVIL,MOTRIN) 200 MG tablet Take 600 mg by mouth every 6 (six) hours as needed for moderate pain.     Marland Kitchen ondansetron (ZOFRAN) 8 MG tablet Take 1 tablet (8 mg total) by mouth every 8 (eight) hours as needed for nausea or vomiting. (Patient not taking: Reported on 02/14/2015) 30 tablet 2  . pravastatin (PRAVACHOL) 40 MG tablet TAKE ONE (1) TABLET BY MOUTH EVERY DAY (Patient taking differently: TAKE ONE (1) TABLET BY MOUTH AT BEDTIME) 90 tablet 6  . prochlorperazine (COMPAZINE) 10 MG tablet Take 1 tablet (10 mg total) by mouth every 6 (six) hours as needed for nausea or vomiting. (Patient not taking: Reported on 02/14/2015) 30 tablet 2   No current facility-administered medications for this visit.    Facility-Administered Medications Ordered in Other Visits  Medication Dose Route Frequency Provider Last Rate Last Dose  . dextrose 5 % and 0.45% NaCl 1,000 mL with potassium chloride 20 mEq, magnesium sulfate 12 mEq, mannitol 12.5 g infusion   Intravenous Once [name], MD  Review of Systems  Constitutional: Negative for fever, chills, weight loss and malaise/fatigue.  HENT: Negative for congestion, hearing loss, nosebleeds, sore throat and tinnitus.   Eyes: Negative for blurred vision, double vision, pain and discharge.  Respiratory: Positive for cough and sputum production. Negative for hemoptysis, shortness of breath and wheezing.   Cardiovascular: Negative for palpitations, claudication, leg swelling and PND.  Gastrointestinal: Negative for heartburn, nausea, vomiting, abdominal pain, diarrhea, constipation, blood in stool and melena.  Genitourinary: Negative for dysuria, urgency, frequency and hematuria.  Musculoskeletal: Positive for joint pain. Negative for myalgias and falls.       Has had knee surgeries.  Skin: Negative for itching and rash.  Neurological: Negative for dizziness, tingling, tremors, sensory change, speech change, focal weakness, seizures, loss of consciousness, weakness and headaches.  Endo/Heme/Allergies: Does not bruise/bleed easily.  Psychiatric/Behavioral: Negative for depression, suicidal ideas, memory loss and substance abuse. The patient is not nervous/anxious and does not have insomnia.   All other systems reviewed and are negative.  14 point ROS was done and is otherwise as detailed above or in HPI   PHYSICAL EXAMINATION: ECOG PERFORMANCE STATUS: 1 - Symptomatic but completely ambulatory  Vitals with BMI 02/14/2015  Height   Weight 219 lbs 3 oz  BMI   Systolic 407  Diastolic 69  Pulse 680  Respirations 22   Physical Exam  Constitutional: He is oriented to person, place, and time and well-developed, well-nourished, and in no  distress.  HENT:  Head: Normocephalic and atraumatic.  Nose: Nose normal.  Mouth/Throat: Oropharynx is clear and moist. No oropharyngeal exudate.  Dentures on top and bottom.  Eyes: Conjunctivae and EOM are normal. Pupils are equal, round, and reactive to light. Right eye exhibits no discharge. Left eye exhibits no discharge. No scleral icterus.  Neck: Normal range of motion. Neck supple. No tracheal deviation present. No thyromegaly present.  Cardiovascular: Normal rate, regular rhythm and normal heart sounds.  Exam reveals no gallop and no friction rub.   No murmur heard. Pulmonary/Chest: Effort normal. He has no wheezes. He has no rales.  Decreased breath sounds on the right.  Abdominal: Soft. Bowel sounds are normal. He exhibits no distension and no mass. There is no tenderness. There is no rebound and no guarding.  Musculoskeletal: Normal range of motion. He exhibits no edema.  Lymphadenopathy:    He has no cervical adenopathy.  Neurological: He is alert and oriented to person, place, and time. He has normal reflexes. No cranial nerve deficit. Gait normal. Coordination normal.  Skin: Skin is warm and dry. No rash noted.  Psychiatric: Mood, memory, affect and judgment normal.  Nursing note and vitals reviewed.   LABORATORY DATA:  I have reviewed the data as listed Results for NAJEEB, UPTAIN (MRN [id number])   Ref. Range 02/14/2015 08:00  Sodium Latest Ref Range: 135-145 mmol/L 126 (L)  Potassium Latest Ref Range: 3.5-5.1 mmol/L 3.8  Chloride Latest Ref Range: 101-111 mmol/L 90 (L)  CO2 Latest Ref Range: 22-32 mmol/L 27  BUN Latest Ref Range: 6-20 mg/dL 10  Creatinine Latest Ref Range: 0.61-1.24 mg/dL 0.96  Calcium Latest Ref Range: 8.9-10.3 mg/dL 8.2 (L)  EGFR (Non-African Amer.) Latest Ref Range: >60 mL/min >60  EGFR (African American) Latest Ref Range: >60 mL/min >60  Glucose Latest Ref Range: 65-99 mg/dL 128 (H)  Anion gap Latest Ref Range: 5-15  9  Alkaline Phosphatase  Latest Ref Range: 38-126 U/L 49  Albumin Latest Ref Range: 3.5-5.0 g/dL 2.9 (L)  AST  Latest Ref Range: 15-41 U/L 21  ALT Latest Ref Range: 17-63 U/L 15 (L)  Total Protein Latest Ref Range: 6.5-8.1 g/dL 7.2  Total Bilirubin Latest Ref Range: 0.3-1.2 mg/dL 1.3 (H)  WBC Latest Ref Range: 4.0-10.5 K/uL 11.1 (H)  RBC Latest Ref Range: 4.22-5.81 MIL/uL 3.50 (L)  Hemoglobin Latest Ref Range: 13.0-17.0 g/dL 10.1 (L)  HCT Latest Ref Range: 39.0-52.0 % 30.7 (L)  MCV Latest Ref Range: 78.0-100.0 fL 87.7  MCH Latest Ref Range: 26.0-34.0 pg 28.9  MCHC Latest Ref Range: 30.0-36.0 g/dL 32.9  RDW Latest Ref Range: 11.5-15.5 % 14.1  Platelets Latest Ref Range: 150-400 K/uL 254  Neutrophils Latest Units: % 76  Lymphocytes Latest Units: % 12  Monocytes Relative Latest Units: % 11  Eosinophil Latest Units: % 1  Basophil Latest Units: % 0  NEUT# Latest Ref Range: 1.7-7.7 K/uL 8.4 (H)  Lymphocyte # Latest Ref Range: 0.7-4.0 K/uL 1.4  Monocyte # Latest Ref Range: 0.1-1.0 K/uL 1.3 (H)  Eosinophils Absolute Latest Ref Range: 0.0-0.7 K/uL 0.1  Basophils Absolute Latest Ref Range: 0.0-0.1 K/uL 0.0   RADIOGRAPHIC STUDIES: I have personally reviewed the radiological images as listed and agreed with the findings in the report.  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  EXAM: NUCLEAR MEDICINE PET SKULL BASE TO THIGH  TECHNIQUE: 10.9 mCi F-18 FDG was injected intravenously. Full-ring PET imaging was performed from the skull base to thigh after the radiotracer. CT data was obtained and used for attenuation correction and anatomic localization.  FASTING BLOOD GLUCOSE: Value: 98 mg/dl  COMPARISON: 01/18/2015  FINDINGS: NECK  Bilateral tonsillar pillar activity appears symmetric and probably physiologic.  CHEST  Right hilar mass maximum standard uptake value 13.0. Similar appearance to prior with regard to occlusion of the right upper  lobe tracheobronchial tree and infrahilar and mediastinal extension. Peribronchovascular nodularity in the right upper lobe demonstrates low-level activity, maximum standard uptake value approximately 2.6, possibly from malignancy or postobstructive pneumonitis. Underlying emphysema. Peripherally increased signal along the increasing region of consolidation in the peripheral right upper lobe on image 77 series 4, maximum standard uptake value 3.8.  ABDOMEN/PELVIS  Likely physiologic activity along the cardia of the stomach. Previously described mass in the pancreatic tail has a maximum standard uptake value 12.6. This appears separate from the left adrenal gland. No hypermetabolic liver lesions.  Infrarenal abdominal aortic aneurysm measuring 5.8 cm in diameter on image 143 series 4, fusiform and with peripheral atherosclerosis. High activity along the right T12 transverse process as along the articulation with the rib and probably degenerative.  SKELETON  No focal hypermetabolic activity to suggest skeletal metastasis.  IMPRESSION: 1. Large hypermetabolic right hilar mass with mediastinal invasion and infrahilar extension, maximum standard uptake value 13.0. 2. Patchy opacities with increased activity in the apical segment right upper lobe and especially peripherally along the posterior inferior margin of the right upper lobe, potentially from tumor extension or postobstructive pneumonitis. 3. Pancreatic tail mass, approximately 4.5 cm in diameter, could be a metastatic lesion or a primary pancreatic adenocarcinoma. No liver metastatic lesion identified. 4. Abdominal aortic aneurysm, 5.8 cm in diameter. Vascular surgery consultation recommended due to increased risk of rupture for AAA >5.5 cm. This recommendation follows ACR consensus guidelines: White Paper of the ACR Incidental Findings Committee II on Vascular Findings. J Am Coll Radiol 2013;  10:789-794.   Electronically Signed  By: Van Clines M.D.  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 Large hypermetabolic right hilar mass with mediastinal invasion and infrahilar extension  Hyponatremia c/w SIADH  EUS with Dr. JaArdis Hughsnd pancreatic biopsy on 02/10/2015  He will start cycle #1 of therapy with cisplatin/etoposide. If final pathology from his pancreatic biopsy is c/w SCLC I will change therapy to carboplatin.  He will follow-up in one week for ongoing discussion, and to assess tolerance.   I emphasized the importance of tobacco cessation. Sodium will be followed but I anticipate normalization in the next week or so with treatment. He will be advised to monitor his fluid intake. He is asymptomatic.   All questions were answered. The patient knows to call the clinic with any problems, questions or concerns.  This document serves as a record of services personally performed by ShAncil LinseyMD. It was created on her behalf by ElArlyce Harmana trained medical scribe. The creation of this record is based on the scribe's personal observations and the provider's statements to them. This document has been checked and approved by the attending provider.  I have reviewed the above documentation for accuracy and completeness, and I agree with the above.  This note was electronically signed.    ShKelby FamPenland, MD   02/14/2015 9:07  AM

## 2015-02-14 NOTE — Progress Notes (Signed)
Tolerated chemo well. 

## 2015-02-14 NOTE — Patient Instructions (Signed)
[place] at [facility] Discharge Instructions  RECOMMENDATIONS MADE BY THE CONSULTANT AND ANY TEST RESULTS WILL BE SENT TO YOUR REFERRING PHYSICIAN.  Exam completed by Dr Whitney Muse Return in 1 week for a follow up after first treatment. Return in 2 weeks for chemotherapy. Please call the clinic if you have any questions or concerns  Thank you for choosing [place] at [facility] to provide your oncology and hematology care.  To afford each patient quality time with our provider, please arrive at least 15 minutes before your scheduled appointment time.    You need to re-schedule your appointment should you arrive 10 or more minutes late.  We strive to give you quality time with our providers, and arriving late affects you and other patients whose appointments are after yours.  Also, if you no show three or more times for appointments you may be dismissed from the clinic at the providers discretion.     Again, thank you for choosing [facility].  Our hope is that these requests will decrease the amount of time that you wait before being seen by our physicians.       _____________________________________________________________  Should you have questions after your visit to [facility], please contact our office at [phone] between the hours of 8:30 a.m. and 4:30 p.m.  Voicemails left after 4:30 p.m. will not be returned until the following business day.  For prescription refill requests, have your pharmacy contact our office.

## 2015-02-15 ENCOUNTER — Encounter (HOSPITAL_BASED_OUTPATIENT_CLINIC_OR_DEPARTMENT_OTHER): Payer: 59

## 2015-02-15 ENCOUNTER — Encounter (HOSPITAL_COMMUNITY): Payer: Self-pay

## 2015-02-15 ENCOUNTER — Encounter: Payer: Self-pay | Admitting: *Deleted

## 2015-02-15 VITALS — BP 124/75 | HR 71 | Temp 97.5°F | Resp 20 | Wt 220.0 lb

## 2015-02-15 DIAGNOSIS — Z5111 Encounter for antineoplastic chemotherapy: Secondary | ICD-10-CM | POA: Diagnosis not present

## 2015-02-15 DIAGNOSIS — C3491 Malignant neoplasm of unspecified part of right bronchus or lung: Secondary | ICD-10-CM

## 2015-02-15 DIAGNOSIS — C3411 Malignant neoplasm of upper lobe, right bronchus or lung: Secondary | ICD-10-CM | POA: Diagnosis not present

## 2015-02-15 MED ORDER — SODIUM CHLORIDE 0.9 % IV SOLN
Freq: Once | INTRAVENOUS | Status: AC
  Administered 2015-02-15: 10:00:00 via INTRAVENOUS

## 2015-02-15 MED ORDER — SODIUM CHLORIDE 0.9 % IV SOLN
10.0000 mg | Freq: Once | INTRAVENOUS | Status: AC
  Administered 2015-02-15: 10 mg via INTRAVENOUS
  Filled 2015-02-15: qty 1

## 2015-02-15 MED ORDER — PALONOSETRON HCL INJECTION 0.25 MG/5ML
INTRAVENOUS | Status: AC
  Filled 2015-02-15: qty 5

## 2015-02-15 MED ORDER — SODIUM CHLORIDE 0.9 % IV SOLN
100.0000 mg/m2 | Freq: Once | INTRAVENOUS | Status: AC
  Administered 2015-02-15: 220 mg via INTRAVENOUS
  Filled 2015-02-15: qty 11

## 2015-02-15 MED ORDER — PALONOSETRON HCL INJECTION 0.25 MG/5ML
0.2500 mg | Freq: Once | INTRAVENOUS | Status: AC
  Administered 2015-02-15: 0.25 mg via INTRAVENOUS

## 2015-02-15 NOTE — Progress Notes (Signed)
[place] Psychosocial Distress Screening Clinical Social Work  Clinical Social Work was referred by distress screening protocol.  The patient scored a 8 on the Psychosocial Distress Thermometer which indicates severe distress. Clinical Social Worker met with pt and his wife during infusion to assess for distress and other psychosocial needs. CSW introduced self and explained role of CSW. 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 Wife reports pt has an appt at social security disability in the next week. CSW discussed with wife where to get list of needed documents to assist with this process. CSW also provided pt with other resources to assist such as [place] and [facility]anne [place]. Pt reports he has really made an effort to stop smoking and CSW discussed ways to reduce anxiety. Pt and wife report they have had many stressful events in the last few years. They have three grown children and two teenagers at home ages 23 and 31. They are NOT currently aware of pt's illness.  CSW provide pt and wife handouts about local resources and CSW assistance available. Pt and wife encouraged to reach out to CSW as needed.   ONCBCN DISTRESS SCREENING 02/14/2015  Screening Type Initial Screening  Distress experienced in past week (1-10) 8  Practical problem type Work/school  Emotional problem type Depression;Adjusting to illness;Boredom;Feeling hopeless  Physical Problem type Pain;Breathing  Physician notified of physical symptoms Yes  Referral to clinical social work Yes  Referral to financial advocate Yes  Other (No Data)    Clinical Social Worker follow up needed: Yes.    If yes, follow up plan: See above Loren Racer, [place] Tuesdays   [phone]

## 2015-02-15 NOTE — Progress Notes (Signed)
1115:  Tolerated tx w/o adverse reaction.  A&Ox4; in no distress.  VSS. Discharged ambulatory.

## 2015-02-15 NOTE — Patient Instructions (Signed)
[facility] Discharge Instructions for Patients Receiving Chemotherapy  Today you received the following chemotherapy agents:  Etoposide  To help prevent nausea and vomiting after your treatment, we encourage you to take your nausea medication as prescribed.  If you develop nausea and vomiting, or diarrhea that is not controlled by your medication, call the clinic.  The clinic phone number is [phone]. Office hours are Monday-Friday 8:30am-5:00pm.  BELOW ARE SYMPTOMS THAT SHOULD BE REPORTED IMMEDIATELY:  *FEVER GREATER THAN 101.0 F  *CHILLS WITH OR WITHOUT FEVER  NAUSEA AND VOMITING THAT IS NOT CONTROLLED WITH YOUR NAUSEA MEDICATION  *UNUSUAL SHORTNESS OF BREATH  *UNUSUAL BRUISING OR BLEEDING  TENDERNESS IN MOUTH AND THROAT WITH OR WITHOUT PRESENCE OF ULCERS  *URINARY PROBLEMS  *BOWEL PROBLEMS  UNUSUAL RASH Items with * indicate a potential emergency and should be followed up as soon as possible. If you have an emergency after office hours please contact your primary care physician or go to the nearest emergency department.  Please call the clinic during office hours if you have any questions or concerns.   You may also contact the Patient Navigator at [phone] should you have any questions or need assistance in obtaining follow up care. _____________________________________________________________________ Have you asked about our STAR program?    STAR stands for Survivorship Training and Rehabilitation, and this is a nationally recognized cancer care program that focuses on survivorship and rehabilitation.  Cancer and cancer treatments may cause problems, such as, pain, making you feel tired and keeping you from doing the things that you need or want to do. Cancer rehabilitation can help. Our goal is to reduce these troubling effects and help you have the best quality of life possible.  You may receive a survey from a nurse that asks questions about  your current state of health.  Based on the survey results, all eligible patients will be referred to the [facility] program for an evaluation so we can better serve you! A frequently asked questions sheet is available upon request.

## 2015-02-16 ENCOUNTER — Encounter (HOSPITAL_BASED_OUTPATIENT_CLINIC_OR_DEPARTMENT_OTHER): Payer: 59

## 2015-02-16 VITALS — BP 111/72 | HR 77 | Temp 97.2°F | Resp 20 | Wt 221.2 lb

## 2015-02-16 DIAGNOSIS — C3491 Malignant neoplasm of unspecified part of right bronchus or lung: Secondary | ICD-10-CM

## 2015-02-16 DIAGNOSIS — C3411 Malignant neoplasm of upper lobe, right bronchus or lung: Secondary | ICD-10-CM

## 2015-02-16 DIAGNOSIS — Z5111 Encounter for antineoplastic chemotherapy: Secondary | ICD-10-CM

## 2015-02-16 MED ORDER — PEGFILGRASTIM 6 MG/0.6ML ~~LOC~~ PSKT
6.0000 mg | PREFILLED_SYRINGE | Freq: Once | SUBCUTANEOUS | Status: AC
  Administered 2015-02-16: 6 mg via SUBCUTANEOUS

## 2015-02-16 MED ORDER — SODIUM CHLORIDE 0.9 % IV SOLN
10.0000 mg | Freq: Once | INTRAVENOUS | Status: AC
  Administered 2015-02-16: 10 mg via INTRAVENOUS
  Filled 2015-02-16: qty 1

## 2015-02-16 MED ORDER — PALONOSETRON HCL INJECTION 0.25 MG/5ML
0.2500 mg | Freq: Once | INTRAVENOUS | Status: AC
  Administered 2015-02-16: 0.25 mg via INTRAVENOUS

## 2015-02-16 MED ORDER — PALONOSETRON HCL INJECTION 0.25 MG/5ML
INTRAVENOUS | Status: AC
  Filled 2015-02-16: qty 5

## 2015-02-16 MED ORDER — SODIUM CHLORIDE 0.9 % IV SOLN
100.0000 mg/m2 | Freq: Once | INTRAVENOUS | Status: AC
  Administered 2015-02-16: 220 mg via INTRAVENOUS
  Filled 2015-02-16: qty 11

## 2015-02-16 MED ORDER — SODIUM CHLORIDE 0.9 % IJ SOLN
10.0000 mL | INTRAMUSCULAR | Status: DC | PRN
  Administered 2015-02-16: 10 mL
  Filled 2015-02-16: qty 10

## 2015-02-16 MED ORDER — SODIUM CHLORIDE 0.9 % IV SOLN
Freq: Once | INTRAVENOUS | Status: AC
  Administered 2015-02-16: 09:00:00 via INTRAVENOUS

## 2015-02-16 MED ORDER — PEGFILGRASTIM 6 MG/0.6ML ~~LOC~~ PSKT
PREFILLED_SYRINGE | SUBCUTANEOUS | Status: AC
  Filled 2015-02-16: qty 0.6

## 2015-02-16 NOTE — Addendum Note (Signed)
Addended by: Gerhard Perches on: 02/16/2015 11:33 AM   Modules accepted: Orders

## 2015-02-16 NOTE — Progress Notes (Signed)
Tolerated chemo well. [name] KitchenRoby [name] arrived today for [facility] neulasta on body injector. See MAR for administration details. Injector in place and engaged with green light indicator on flashing. Tolerated application with out problems.

## 2015-02-16 NOTE — Patient Instructions (Signed)
[facility] Discharge Instructions for Patients Receiving Chemotherapy  Today you received the following chemotherapy agents Etoposide Day 3 Cycle 1. Neulasta OnPro Body injector placed on upper arm. Injector will deliver medication between 2 pm and 3 pm tomorrow. You may safely remove injector after 3 pm and when indicator reads empty.  To help prevent nausea and vomiting after your treatment, we encourage you to take your nausea medication as instructed. If you develop nausea and vomiting that is not controlled by your nausea medication, call the clinic. If it is after clinic hours your family physician or the after hours number for the clinic or go to the Emergency Department. BELOW ARE SYMPTOMS THAT SHOULD BE REPORTED IMMEDIATELY:  *FEVER GREATER THAN 101.0 F  *CHILLS WITH OR WITHOUT FEVER  NAUSEA AND VOMITING THAT IS NOT CONTROLLED WITH YOUR NAUSEA MEDICATION  *UNUSUAL SHORTNESS OF BREATH  *UNUSUAL BRUISING OR BLEEDING  TENDERNESS IN MOUTH AND THROAT WITH OR WITHOUT PRESENCE OF ULCERS  *URINARY PROBLEMS  *BOWEL PROBLEMS  UNUSUAL RASH Items with * indicate a potential emergency and should be followed up as soon as possible.  Return as scheduled.  I have been informed and understand all the instructions given to me. I know to contact the clinic, my physician, or go to the Emergency Department if any problems should occur. I do not have any questions at this time, but understand that I may call the clinic during office hours or the Patient Navigator at [phone] should I have any questions or need assistance in obtaining follow up care.    __________________________________________  _____________  __________ Signature of Patient or Authorized Representative            Date                   Time    __________________________________________ Nurse's Signature

## 2015-02-17 ENCOUNTER — Telehealth (HOSPITAL_COMMUNITY): Payer: Self-pay | Admitting: *Deleted

## 2015-02-17 NOTE — Telephone Encounter (Signed)
Received call from patient's wife Edwena Blow. She wanted to verify that OnPro was working properly. Reviewed with wife. Verbalized understanding. Denies any complaints from patient post chemo. Reports they are on their way to [facility] now. Return as scheduled.

## 2015-02-18 ENCOUNTER — Encounter (HOSPITAL_BASED_OUTPATIENT_CLINIC_OR_DEPARTMENT_OTHER): Payer: 59

## 2015-02-18 VITALS — BP 119/66 | HR 73 | Temp 97.3°F | Resp 20

## 2015-02-18 DIAGNOSIS — C3411 Malignant neoplasm of upper lobe, right bronchus or lung: Secondary | ICD-10-CM

## 2015-02-18 DIAGNOSIS — Z0000 Encounter for general adult medical examination without abnormal findings: Secondary | ICD-10-CM | POA: Diagnosis not present

## 2015-02-18 DIAGNOSIS — C3491 Malignant neoplasm of unspecified part of right bronchus or lung: Secondary | ICD-10-CM

## 2015-02-18 LAB — BASIC METABOLIC PANEL
Anion gap: 10 (ref 5–15)
BUN: 20 mg/dL (ref 6–20)
CHLORIDE: 93 mmol/L — AB (ref 101–111)
CO2: 29 mmol/L (ref 22–32)
CREATININE: 1.03 mg/dL (ref 0.61–1.24)
Calcium: 8.5 mg/dL — ABNORMAL LOW (ref 8.9–10.3)
GFR calc Af Amer: >60 mL/min (ref >60)
GFR calc non Af Amer: >60 mL/min (ref >60)
GLUCOSE: 87 mg/dL (ref 65–99)
Potassium: 4.2 mmol/L (ref 3.5–5.1)
Sodium: 132 mmol/L — ABNORMAL LOW (ref 135–145)

## 2015-02-18 LAB — CBC
HEMATOCRIT: 35 % — AB (ref 39.0–52.0)
HEMOGLOBIN: 11.6 g/dL — AB (ref 13.0–17.0)
MCH: 29.4 pg (ref 26.0–34.0)
MCHC: 33.1 g/dL (ref 30.0–36.0)
MCV: 88.6 fL (ref 78.0–100.0)
Platelets: 277 10*3/uL (ref 150–400)
RBC: 3.95 MIL/uL — AB (ref 4.22–5.81)
RDW: 14.1 % (ref 11.5–15.5)
WBC: 16.3 10*3/uL — AB (ref 4.0–10.5)

## 2015-02-18 MED ORDER — SODIUM CHLORIDE 0.9 % IV SOLN
INTRAVENOUS | Status: DC
  Administered 2015-02-18: 11:00:00 via INTRAVENOUS
  Filled 2015-02-18 (×11): qty 1000

## 2015-02-18 NOTE — Progress Notes (Signed)
Patient reports he thinks he has been drinking enough fluids. He is upset that he is here because "we" said he needed to be. Wife states she doesn't think he drinks enough fluids. Patient states he thinks he urinates frequently enough even 3-4 times during the night last night. Wife is neither able to confirm or deny this. Patient states if I don't need to be here then I'm leaving. Patient agreed to start IV fluids and send blood for lab test, but if he doesn't need fluids he doesn't want them. Will discuss this with MD when receive lab results. Lab work reviewed with Meriel Flavors. Informed patient has received apx 350 ml of ordered IV fluids. He said if patient does not want to stay,let him go. Offered rest of fluids to patient and he declined stating "let me out of here". Patient and wife verbalized understanding to return next week for lab appt.

## 2015-02-18 NOTE — Progress Notes (Signed)
Labs drawn

## 2015-02-18 NOTE — Patient Instructions (Signed)
[place] at [facility] Discharge Instructions  RECOMMENDATIONS MADE BY THE CONSULTANT AND ANY TEST RESULTS WILL BE SENT TO YOUR REFERRING PHYSICIAN.  Lab work ok today. Return as scheduled.  Thank you for choosing [place] at [facility] to provide your oncology and hematology care.  To afford each patient quality time with our provider, please arrive at least 15 minutes before your scheduled appointment time.    You need to re-schedule your appointment should you arrive 10 or more minutes late.  We strive to give you quality time with our providers, and arriving late affects you and other patients whose appointments are after yours.  Also, if you no show three or more times for appointments you may be dismissed from the clinic at the providers discretion.     Again, thank you for choosing [facility].  Our hope is that these requests will decrease the amount of time that you wait before being seen by our physicians.       _____________________________________________________________  Should you have questions after your visit to [facility], please contact our office at [phone] between the hours of 8:30 a.m. and 4:30 p.m.  Voicemails left after 4:30 p.m. will not be returned until the following business day.  For prescription refill requests, have your pharmacy contact our office.

## 2015-02-21 NOTE — Progress Notes (Signed)
[name] Fraction, MD [address] [place] [address]  Small cell lung cancer, right ([place]) - Plan: CBC with Differential, Comprehensive metabolic panel, CBC with Differential, Comprehensive metabolic panel, CBC with Differential, Comprehensive metabolic panel, 0.9 %  sodium chloride infusion  Mass of pancreas  Creatinine elevation - Plan: 0.9 %  sodium chloride infusion  CURRENT THERAPY: S/P cycle 1 of Cisplatin/Etoposide  INTERVAL HISTORY: [name] 54 y.o. 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    Small cell lung cancer ([place])   01/18/2015 Imaging Chest Xray- 6.8 cm x 7.6 cm RIGHT hilar mass most compatible with bronchogenic carcinoma.    01/18/2015 Imaging CT chest- 8.7 x 8.7 cm R hilar mass encasing the R pulm artery and occluding the R upper lobe bronchus. Sm R pleural effusion; malignant or reactive. 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 34 mm x 26 mm mass in body of pancreas   01/21/2015 Imaging MRI brain- No acute intracranial findings. No signs of metastatic disease. Minor white matter disease, stable and nonspecific.   01/26/2015 Pathology Results Lung, biopsy, right upper lobe - SMALL CELL CARCINOMA.   01/26/2015 Pathology Results Diagnosis FINE NEEDLE ASPIRATION: ENDOSCOPIC SPECIMEN A, EBUS LEVEL 7 NODE (SPECIMEN 1 OF 3 COLLECTED 01/26/2015) MALIGNANT CELLS PRESENT, CONSISTENT WITH SMALL CELL CARCINOMA.   01/26/2015 Pathology Results WANG NEEDLE ASPIRATION, SPECIMEN B LEVEL 7 NODE (SPECIMEN 2 OF 3 COLLECTED 01/26/2015) MALIGNANT CELLS PRESENT, CONSISTENT WITH SMALL CELL CARCINOMA.   01/26/2015 Pathology Results Diagnosis BRONCHIAL BRUSHING SPECIMEN C, RIGHT UPPER LOBE (SPECIMEN 3 OF 3 COLLECTED 01/26/2015) ATYPICAL CELLS PRESENT.   01/27/2015 PET scan Large hypermetabolic right hilar mass with mediastinal invasion and infrahilar  extension, maximum standard uptake value 13.0.  Pancreatic tail mass, approximately 4.5 cm in diameter. Abdominal aortic aneurysm, 5.8 cm in diameter.   02/10/2015 Pathology Results Diagnosis FINE NEEDLE ASPIRATION, ENDOSCOPIC, PANCREAS TAIL (SPECIMEN 1 OF 1 COLLECTED 02/10/15): MALIGNANT CELLS CONSISTENT WITH METASTATIC SMALL CELL CARCINOMA.   02/14/2015 -  Chemotherapy Cisplatin/Etoposide    TODAY IS A NADIR CHECK.  I personally reviewed and went over laboratory results with the patient.  The results are noted within this dictation.  Labs will be updated today.  Labs returned following completion of today's appointment.  Increase in creatinine is noted, along with leukopenia, and thrombocytopenia.  He reports to me that he is drinking 10-12 bottles of H2O daily.  I have encouraged him to continue to do this.  He reports that he does not drink Sundrop soda anymore.  I educated him on the importance of hydration while on Cisplatin chemotherapy.    "I haven't heard from radiation yet."  I have educated him on his treatment plan.  Given the fact that his pancreatic mass was positive for metastatic small cell lung cancer, he has Stage IV/Extensive stage disease.  As a result, radiation is not needed at this time.  He will need prophylactic whole brain radiation after completing systemic chemotherapy due to his small cell disease.  I have reviewed Dr. Donald Pore note from October confirming the role of radiation.  He reports good tolerability of cycle 1 of chemotherapy.  His weight is down 20 lbs over the past 7-10 days.  He notes that his appetite is good.  We will need to monitor closely moving forward.  Past Medical History  Diagnosis Date  . Coronary artery disease 2002    Lesion in LAD s/p angioplasty; s/p CABG x 4 Sept 2013  . Tobacco abuse   . Myocardial infarct [facility]) [id number]    stents  . Hyperlipidemia 1999  . Essential hypertension, benign 1999  . Lung mass   . COPD (chronic  obstructive pulmonary disease) ([place])   . Shortness of breath dyspnea     with exertion, sleeps with more pillows under head  . Arthritis   . Pancreatitis   . Complication of anesthesia     woke up while being extubated and remembers fighting  . Cancer ([place])     lung  . Mass of pancreas     has HYPERTENSION, BENIGN; CAD, NATIVE VESSEL; Mixed hyperlipidemia; Tobacco abuse; COPD (chronic obstructive pulmonary disease) ([place]); Lung mass-right; Pancreatic mass; Mass of pancreas; and Small cell lung cancer ([place]) on his problem list.     is allergic to lisinopril and tea.  Current Outpatient Prescriptions on File Prior to Visit  Medication Sig Dispense Refill  . amLODipine (NORVASC) 5 MG tablet TAKE ONE (1) TABLET BY MOUTH EVERY DAY (Patient taking differently: TAKE ONE (1) TABLET BY MOUTH AT BEDTIME) 90 tablet 6  . aspirin EC 81 MG tablet Take 81 mg by mouth daily.    . carvedilol (COREG) 6.25 MG tablet TAKE ONE TABLET BY MOUTH TWICE A DAY WITH A MEAL 180 tablet 3  . fentaNYL (DURAGESIC - DOSED MCG/HR) 50 MCG/HR Place 1 patch (50 mcg total) onto the skin every 3 (three) days. 10 patch 0  . HYDROcodone-acetaminophen (NORCO) 10-325 MG tablet Take 1 tablet by mouth every 6 (six) hours as needed. 90 tablet 0  . ibuprofen (ADVIL,MOTRIN) 200 MG tablet Take 600 mg by mouth every 6 (six) hours as needed for moderate pain.     Marland Kitchen levofloxacin (LEVAQUIN) 500 MG tablet Take one tablet by mouth daily until gone start tomorrow 02/15/2015 14 tablet 0  . ondansetron (ZOFRAN) 8 MG tablet Take 1 tablet (8 mg total) by mouth every 8 (eight) hours as needed for nausea or vomiting. 30 tablet 2  . pravastatin (PRAVACHOL) 40 MG tablet TAKE ONE (1) TABLET BY MOUTH EVERY DAY (Patient taking differently: TAKE ONE (1) TABLET BY MOUTH AT BEDTIME) 90 tablet 6  . prochlorperazine (COMPAZINE) 10 MG tablet Take 1 tablet (10 mg total) by mouth every 6 (six) hours as needed for nausea or vomiting. 30 tablet 2   No current  facility-administered medications on file prior to visit.    Past Surgical History  Procedure Laterality Date  . Knee surgery      left x 2  ACL repair  . Tee without cardioversion  01/02/2012    Procedure: TRANSESOPHAGEAL ECHOCARDIOGRAM (TEE);  Surgeon: [name], MD;  Location: [place];  Service: Open Heart Surgery;  Laterality: N/A;  . Multiple extractions with alveoloplasty  02/21/2012    Procedure: MULTIPLE EXTRACION WITH ALVEOLOPLASTY;  Surgeon: Lenn Cal, DDS;  Location: [facility];  Service: Oral Surgery;  Laterality: N/A;  Extaction of tooth #'s 4,5,6,7,10,11,12,13,14,15,19,20,21,22,23,24,25,   . Left heart catheterization with coronary angiogram N/A 01/01/2012    Procedure: LEFT HEART CATHETERIZATION WITH CORONARY ANGIOGRAM;  Surgeon: Hillary Bow, MD;  Location: [facility] C[facility] LAB;  Service: Cardiovascular;  Laterality: N/A;  . Coronary artery bypass graft  01/02/2012    Procedure: CORONARY ARTERY BYPASS GRAFTING (CABG);  Surgeon: [name], MD;  Location: [facility];  Service: Open Heart Surgery;  Laterality: N/A;  times three using left internal mammary artery and right endoscopically harvested saphenous vein  . Coronary angioplasty  1999, 2002  . Video bronchoscopy with endobronchial ultrasound N/A 01/26/2015    Procedure: VIDEO BRONCHOSCOPY WITH ENDOBRONCHIAL ULTRASOUND;  Surgeon: [name], MD;  Location: [place];  Service: Thoracic;  Laterality: N/A;  . Lung biopsy N/A 01/26/2015    Procedure: LUNG BIOPSY;  Surgeon: [name], MD;  Location: [place];  Service: Thoracic;  Laterality: N/A;  . Lymph node biopsy N/A 01/26/2015    Procedure: LYMPH NODE BIOPSY;  Surgeon: [name], MD;  Location: [facility];  Service: Thoracic;  Laterality: N/A;  . Portacath placement Left 02/01/2015    Procedure: ATTEMPTED INSERTION OF PORT-A-CATH;  Surgeon: [name], MD;  Location: [place];  Service: Thoracic;  Laterality: Left;  . Eus N/A 02/10/2015    Procedure:  UPPER ENDOSCOPIC ULTRASOUND (EUS) LINEAR;  Surgeon: [name], MD;  Location: [facility];  Service: Endoscopy;  Laterality: N/A;    Denies any headaches, dizziness, double vision, fevers, chills, night sweats, nausea, vomiting, diarrhea, constipation, chest pain, heart palpitations, shortness of breath, blood in stool, black tarry stool, urinary pain, urinary burning, urinary frequency, hematuria.   PHYSICAL EXAMINATION  ECOG PERFORMANCE STATUS: 0 - Asymptomatic  Filed Vitals:   02/22/15 0926  BP: 126/77  Pulse: 86  Temp: 97.8 F (36.6 C)  Resp: 18    GENERAL:alert, no distress, well nourished, well developed, comfortable, cooperative and smiling, accompanied by his wife. SKIN: skin color, texture, turgor are normal, no rashes or significant lesions HEAD: Normocephalic, No masses, lesions, tenderness or abnormalities EYES: normal, PERRLA, EOMI, Conjunctiva are pink and non-injected EARS: External ears normal OROPHARYNX:lips, buccal mucosa, and tongue normal and mucous membranes are moist  NECK: supple, no adenopathy, trachea midline LYMPH:  no palpable lymphadenopathy BREAST:not examined LUNGS: clear to auscultation  HEART: regular rate & rhythm, no murmurs and no gallops ABDOMEN:abdomen soft and normal bowel sounds BACK: Back symmetric, no curvature. EXTREMITIES:less then 2 second capillary refill, no joint deformities, effusion, or inflammation, no skin discoloration  NEURO: alert & oriented x 3 with fluent speech, no focal motor/sensory deficits, gait normal   LABORATORY DATA: CBC    Component Value Date/Time   WBC 2.0* 02/22/2015 1100   RBC 4.31 02/22/2015 1100   HGB 12.1* 02/22/2015 1100   HCT 37.1* 02/22/2015 1100   PLT 137* 02/22/2015 1100   MCV 86.1 02/22/2015 1100   MCH 28.1 02/22/2015 1100   MCHC 32.6 02/22/2015 1100   RDW 13.8 02/22/2015 1100   LYMPHSABS 1.4 02/22/2015 1100   MONOABS 0.1 02/22/2015 1100   EOSABS 0.0 02/22/2015 1100   BASOSABS 0.0  02/22/2015 1100      Chemistry      Component Value Date/Time   NA 135 02/22/2015 1100   K 4.3 02/22/2015 1100   CL 95* 02/22/2015 1100   CO2 32 02/22/2015 1100   BUN 26* 02/22/2015 1100   CREATININE 1.43* 02/22/2015 1100   CREATININE 0.99 01/27/2015 1450      Component Value Date/Time   CALCIUM 9.3 02/22/2015 1100   ALKPHOS 62 02/22/2015 1100   AST 17 02/22/2015 1100   ALT 17 02/22/2015 1100   BILITOT 0.7 02/22/2015 1100        PENDING LABS:   RADIOGRAPHIC STUDIES:  Dg Chest 2 View  02/01/2015  CLINICAL DATA:  Preoperative Port-A-Cath insertion. Small cell lung carcinoma EXAM: CHEST  2 VIEW COMPARISON:  Chest radiograph January 24, 2015; PET-CT January 27, 2015 FINDINGS: The large mass arising from the right hilar region is again noted measuring approximately 8 x 8 x 7 cm. There is surrounding patchy opacity, likely representing partially obstructing pneumonitis. No new opacity is identified. Left lung is clear. The heart size is normal. Pulmonary vascularity on the left is normal. There is distortion of pulmonary vascularity on the right, stable. Patient is status post coronary artery bypass grafting. No bone lesions are appreciable. IMPRESSION: Stable extensive mass with surrounding pneumonitis on the right, not significantly changed from recent prior studies. No new opacity. Cardiac silhouette within normal limits. Electronically Signed   By: Lowella Grip III M.D.   On: 02/01/2015 09:53   Dg Chest 2 View  01/24/2015  CLINICAL DATA:  Right lung mass, current smoker, history of COPD, known pancreatic mass. EXAM: CHEST  2 VIEW COMPARISON:  CT scan of the chest of January 18, 2015 and chest x-ray of this same days. FINDINGS: The large right perihilar mass is again demonstrated. The interstitial markings are coarse in the surrounding right lung. The left lung is clear. There is no significant mediastinal shift. There are post CABG changes. There is no evidence of CHF or pleural  effusion. The bony thorax exhibits no acute abnormality. IMPRESSION: Persistent right perihilar mass. There has not been significant interval change in the appearance of the chest since the study of January 18, 2015. Electronically Signed   By: David  Martinique M.D.   On: 01/24/2015 12:42   Nm Pet Image Initial (pi) Skull Base To Thigh  01/27/2015  CLINICAL DATA:  Initial treatment strategy for right upper lobe small cell carcinoma. EXAM: NUCLEAR MEDICINE PET SKULL BASE TO THIGH TECHNIQUE: 10.9 mCi F-18 FDG was injected intravenously. Full-ring PET imaging was performed from the skull base to thigh after the radiotracer. CT data was obtained and used for attenuation correction and anatomic localization. FASTING BLOOD GLUCOSE:  Value: 98 mg/dl COMPARISON:  01/18/2015 FINDINGS: NECK Bilateral tonsillar pillar activity appears symmetric and probably physiologic. CHEST Right hilar mass maximum standard uptake value 13.0. Similar appearance to prior with regard to occlusion of the right upper lobe tracheobronchial tree and infrahilar and mediastinal extension. Peribronchovascular nodularity in the right upper lobe demonstrates low-level activity, maximum standard uptake value approximately 2.6, possibly from malignancy or postobstructive pneumonitis. Underlying emphysema. Peripherally increased signal along the increasing region of consolidation in the peripheral right upper lobe on image 77 series 4, maximum standard uptake value 3.8. ABDOMEN/PELVIS Likely physiologic activity along the cardia of the stomach. Previously described mass in the pancreatic tail has a maximum standard uptake value 12.6. This appears separate from the left adrenal gland. No hypermetabolic liver lesions. Infrarenal abdominal aortic aneurysm measuring 5.8 cm in diameter on image 143 series 4, fusiform and with peripheral atherosclerosis. High activity along the right T12 transverse process as along the articulation with the rib and probably  degenerative. SKELETON No focal hypermetabolic activity to suggest skeletal metastasis. IMPRESSION: 1. Large hypermetabolic right hilar mass with mediastinal invasion and infrahilar extension, maximum standard uptake value 13.0. 2. Patchy opacities with increased activity in the apical segment right upper lobe and especially peripherally along the posterior inferior margin of the right upper lobe, potentially from tumor extension or postobstructive pneumonitis. 3. Pancreatic tail mass, approximately 4.5 cm in diameter, could be a metastatic lesion or a primary pancreatic adenocarcinoma. No liver metastatic lesion identified. 4. Abdominal aortic aneurysm, 5.8 cm in diameter. Vascular surgery consultation recommended due  to increased risk of rupture for AAA >5.5 cm. This recommendation follows ACR consensus guidelines: White Paper of the ACR Incidental Findings Committee II on Vascular Findings. J Am Coll Radiol 2013; 10:789-794. Electronically Signed   By: Van Clines M.D.   On: 01/27/2015 14:10   Dg Chest Port 1 View  02/01/2015  CLINICAL DATA:  Status post attempted Port-A-Cath EXAM: PORTABLE CHEST - 1 VIEW COMPARISON:  02/01/2015 FINDINGS: Cardiac shadow is stable. Postoperative changes are again seen. A large central right hilar mass lesion is again noted and stable. No pneumothorax is noted following attempted port placement. No bony abnormality is seen. IMPRESSION: No pneumothorax is noted. Stable right hilar mass lesion. Electronically Signed   By: Inez Catalina M.D.   On: 02/01/2015 15:05   Dg C-arm 1-60 Min-no Report  02/01/2015  CLINICAL DATA: surgery C-ARM 1-60 MINUTES Fluoroscopy was utilized by the requesting physician.  No radiographic interpretation.     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  Oncology history is developed  today.  Staging is completed in problem list.  Today is a nadir check.  Labs today: CBC diff, CMET.  Renal function demonstrates a bump in creatinine, as a result, we will recall him this week for 1 L of NS at 500 cc/hr.  I have concerns about this patient's reliability, however, today was my first encounter with him.  Therefore, I would like to err on the side of safety.  WBC is noted to be low as expected.  He did receive Neulasta OnPro on 02/16/2015.  He is educated on neutropenic precautions.  Labs in 1 weeks: CBC diff, CMET  Patient is educated on hydration and the importance of this regarding renal function and Cisplatin chemotherapy.  Return as scheduled for cycle 2 of treatment.  He will be due for restaging tests and these can be ordered and scheduled at his next follow-up appointment.    THERAPY PLAN:  Continue treatment as planned.  We will need to restage him in the future.  All questions were answered. The patient knows to call the clinic with any problems, questions or concerns. We can certainly see the patient much sooner if necessary.  Patient and plan discussed with Dr. Ancil Linsey and she is in agreement with the aforementioned.   This note is electronically signed by: Doy Mince 02/22/2015 12:50 PM

## 2015-02-21 NOTE — Assessment & Plan Note (Addendum)
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  Oncology history is developed today.  Staging is completed in problem list.  Today is a nadir check.  Labs today: CBC diff, CMET.  Renal function demonstrates a bump in creatinine, as a result, we will recall him this week for 1 L of NS at 500 cc/hr.  I have concerns about this patient's reliability, however, today was my first encounter with him.  Therefore, I would like to err on the side of safety.  WBC is noted to be low as expected.  He did receive Neulasta OnPro on 02/16/2015.  He is educated on neutropenic precautions.  Labs in 1 weeks: CBC diff, CMET  Patient is educated on hydration and the importance of this regarding renal function and Cisplatin chemotherapy.  Return as scheduled for cycle 2 of treatment.  He will be due for restaging tests and these can be ordered and scheduled at his next follow-up appointment.

## 2015-02-22 ENCOUNTER — Encounter (HOSPITAL_COMMUNITY): Payer: 59 | Attending: Hematology & Oncology | Admitting: Oncology

## 2015-02-22 VITALS — BP 126/77 | HR 86 | Temp 97.8°F | Resp 18 | Wt 203.2 lb

## 2015-02-22 DIAGNOSIS — K8689 Other specified diseases of pancreas: Secondary | ICD-10-CM

## 2015-02-22 DIAGNOSIS — R7989 Other specified abnormal findings of blood chemistry: Secondary | ICD-10-CM

## 2015-02-22 DIAGNOSIS — K869 Disease of pancreas, unspecified: Secondary | ICD-10-CM

## 2015-02-22 DIAGNOSIS — D72819 Decreased white blood cell count, unspecified: Secondary | ICD-10-CM

## 2015-02-22 DIAGNOSIS — D696 Thrombocytopenia, unspecified: Secondary | ICD-10-CM | POA: Diagnosis not present

## 2015-02-22 DIAGNOSIS — C3411 Malignant neoplasm of upper lobe, right bronchus or lung: Secondary | ICD-10-CM

## 2015-02-22 DIAGNOSIS — R918 Other nonspecific abnormal finding of lung field: Secondary | ICD-10-CM | POA: Insufficient documentation

## 2015-02-22 DIAGNOSIS — Z0000 Encounter for general adult medical examination without abnormal findings: Secondary | ICD-10-CM | POA: Insufficient documentation

## 2015-02-22 DIAGNOSIS — R944 Abnormal results of kidney function studies: Secondary | ICD-10-CM

## 2015-02-22 DIAGNOSIS — C3491 Malignant neoplasm of unspecified part of right bronchus or lung: Secondary | ICD-10-CM

## 2015-02-22 DIAGNOSIS — R634 Abnormal weight loss: Secondary | ICD-10-CM

## 2015-02-22 DIAGNOSIS — IMO0002 Reserved for concepts with insufficient information to code with codable children: Secondary | ICD-10-CM

## 2015-02-22 LAB — CBC WITH DIFFERENTIAL/PLATELET
BASOS ABS: 0 10*3/uL (ref 0.0–0.1)
BASOS PCT: 1 %
EOS ABS: 0 10*3/uL (ref 0.0–0.7)
Eosinophils Relative: 2 %
HEMATOCRIT: 37.1 % — AB (ref 39.0–52.0)
Hemoglobin: 12.1 g/dL — ABNORMAL LOW (ref 13.0–17.0)
Lymphocytes Relative: 68 %
Lymphs Abs: 1.4 10*3/uL (ref 0.7–4.0)
MCH: 28.1 pg (ref 26.0–34.0)
MCHC: 32.6 g/dL (ref 30.0–36.0)
MCV: 86.1 fL (ref 78.0–100.0)
MONO ABS: 0.1 10*3/uL (ref 0.1–1.0)
MONOS PCT: 6 %
NEUTROS ABS: 0.5 10*3/uL — AB (ref 1.7–7.7)
NEUTROS PCT: 24 %
Platelets: 137 10*3/uL — ABNORMAL LOW (ref 150–400)
RBC: 4.31 MIL/uL (ref 4.22–5.81)
RDW: 13.8 % (ref 11.5–15.5)
WBC: 2 10*3/uL — ABNORMAL LOW (ref 4.0–10.5)

## 2015-02-22 LAB — COMPREHENSIVE METABOLIC PANEL
ALBUMIN: 3.6 g/dL (ref 3.5–5.0)
ALK PHOS: 62 U/L (ref 38–126)
ALT: 17 U/L (ref 17–63)
ANION GAP: 8 (ref 5–15)
AST: 17 U/L (ref 15–41)
BUN: 26 mg/dL — ABNORMAL HIGH (ref 6–20)
CALCIUM: 9.3 mg/dL (ref 8.9–10.3)
CO2: 32 mmol/L (ref 22–32)
CREATININE: 1.43 mg/dL — AB (ref 0.61–1.24)
Chloride: 95 mmol/L — ABNORMAL LOW (ref 101–111)
GFR, EST NON AFRICAN AMERICAN: 55 mL/min — AB (ref >60)
Glucose, Bld: 89 mg/dL (ref 65–99)
Potassium: 4.3 mmol/L (ref 3.5–5.1)
SODIUM: 135 mmol/L (ref 135–145)
Total Bilirubin: 0.7 mg/dL (ref 0.3–1.2)
Total Protein: 7.7 g/dL (ref 6.5–8.1)

## 2015-02-22 NOTE — Patient Instructions (Signed)
[place] at [facility] Discharge Instructions  RECOMMENDATIONS MADE BY THE CONSULTANT AND ANY TEST RESULTS WILL BE SENT TO YOUR REFERRING PHYSICIAN.  Return in two weeks for treatment and for follow up with the doctor.   We are doing labwork today as well as in one week.   Please see your appointment list for schedule to come back.    Thank you for choosing [place] at [facility] to provide your oncology and hematology care.  To afford each patient quality time with our provider, please arrive at least 15 minutes before your scheduled appointment time.    You need to re-schedule your appointment should you arrive 10 or more minutes late.  We strive to give you quality time with our providers, and arriving late affects you and other patients whose appointments are after yours.  Also, if you no show three or more times for appointments you may be dismissed from the clinic at the providers discretion.     Again, thank you for choosing [facility].  Our hope is that these requests will decrease the amount of time that you wait before being seen by our physicians.       _____________________________________________________________  Should you have questions after your visit to [facility], please contact our office at [phone] between the hours of 8:30 a.m. and 4:30 p.m.  Voicemails left after 4:30 p.m. will not be returned until the following business day.  For prescription refill requests, have your pharmacy contact our office.

## 2015-02-23 ENCOUNTER — Encounter (HOSPITAL_COMMUNITY): Payer: Self-pay | Admitting: *Deleted

## 2015-02-24 ENCOUNTER — Encounter (HOSPITAL_BASED_OUTPATIENT_CLINIC_OR_DEPARTMENT_OTHER): Payer: 59

## 2015-02-24 ENCOUNTER — Encounter (HOSPITAL_COMMUNITY): Payer: Self-pay

## 2015-02-24 VITALS — BP 128/76 | HR 82 | Temp 98.0°F | Resp 20 | Wt 204.0 lb

## 2015-02-24 DIAGNOSIS — R944 Abnormal results of kidney function studies: Secondary | ICD-10-CM | POA: Diagnosis not present

## 2015-02-24 DIAGNOSIS — C3491 Malignant neoplasm of unspecified part of right bronchus or lung: Secondary | ICD-10-CM

## 2015-02-24 DIAGNOSIS — C3411 Malignant neoplasm of upper lobe, right bronchus or lung: Secondary | ICD-10-CM | POA: Diagnosis not present

## 2015-02-24 DIAGNOSIS — R7989 Other specified abnormal findings of blood chemistry: Secondary | ICD-10-CM

## 2015-02-24 MED ORDER — SODIUM CHLORIDE 0.9 % IV SOLN
INTRAVENOUS | Status: DC
  Administered 2015-02-24: 09:00:00 via INTRAVENOUS

## 2015-02-24 NOTE — Patient Instructions (Signed)
[place] at [facility] Discharge Instructions  RECOMMENDATIONS MADE BY THE CONSULTANT AND ANY TEST RESULTS WILL BE SENT TO YOUR REFERRING PHYSICIAN.  Today you received IV fluids - one liter of normal saline. Return as scheduled for lab work and chemotherapy. Call the clinic should you have any questions or concerns.  Thank you for choosing [place] at [facility] to provide your oncology and hematology care.  To afford each patient quality time with our provider, please arrive at least 15 minutes before your scheduled appointment time.    You need to re-schedule your appointment should you arrive 10 or more minutes late.  We strive to give you quality time with our providers, and arriving late affects you and other patients whose appointments are after yours.  Also, if you no show three or more times for appointments you may be dismissed from the clinic at the providers discretion.     Again, thank you for choosing [facility].  Our hope is that these requests will decrease the amount of time that you wait before being seen by our physicians.       _____________________________________________________________  Should you have questions after your visit to [facility], please contact our office at [phone] between the hours of 8:30 a.m. and 4:30 p.m.  Voicemails left after 4:30 p.m. will not be returned until the following business day.  For prescription refill requests, have your pharmacy contact our office.

## 2015-02-28 ENCOUNTER — Other Ambulatory Visit (HOSPITAL_COMMUNITY): Payer: Self-pay | Admitting: *Deleted

## 2015-03-01 ENCOUNTER — Other Ambulatory Visit (HOSPITAL_COMMUNITY): Payer: Self-pay | Admitting: *Deleted

## 2015-03-01 ENCOUNTER — Encounter (HOSPITAL_COMMUNITY): Payer: 59

## 2015-03-01 ENCOUNTER — Other Ambulatory Visit (HOSPITAL_COMMUNITY): Payer: Self-pay | Admitting: Oncology

## 2015-03-01 DIAGNOSIS — Z0000 Encounter for general adult medical examination without abnormal findings: Secondary | ICD-10-CM | POA: Diagnosis not present

## 2015-03-01 DIAGNOSIS — C3491 Malignant neoplasm of unspecified part of right bronchus or lung: Secondary | ICD-10-CM

## 2015-03-01 LAB — CBC WITH DIFFERENTIAL/PLATELET
BASOS ABS: 0.1 10*3/uL (ref 0.0–0.1)
BASOS PCT: 1 %
EOS ABS: 0.2 10*3/uL (ref 0.0–0.7)
Eosinophils Relative: 1 %
HCT: 33.3 % — ABNORMAL LOW (ref 39.0–52.0)
HEMOGLOBIN: 11.1 g/dL — AB (ref 13.0–17.0)
Lymphocytes Relative: 17 %
Lymphs Abs: 3.5 10*3/uL (ref 0.7–4.0)
MCH: 29 pg (ref 26.0–34.0)
MCHC: 33.3 g/dL (ref 30.0–36.0)
MCV: 86.9 fL (ref 78.0–100.0)
Monocytes Absolute: 1.7 10*3/uL — ABNORMAL HIGH (ref 0.1–1.0)
Monocytes Relative: 8 %
NEUTROS ABS: 15.1 10*3/uL — AB (ref 1.7–7.7)
NEUTROS PCT: 73 %
PLATELETS: 166 10*3/uL (ref 150–400)
RBC: 3.83 MIL/uL — ABNORMAL LOW (ref 4.22–5.81)
RDW: 14.6 % (ref 11.5–15.5)
WBC: 20.6 10*3/uL — AB (ref 4.0–10.5)

## 2015-03-01 LAB — COMPREHENSIVE METABOLIC PANEL
ALT: 14 U/L — ABNORMAL LOW (ref 17–63)
AST: 19 U/L (ref 15–41)
Albumin: 3.8 g/dL (ref 3.5–5.0)
Alkaline Phosphatase: 78 U/L (ref 38–126)
Anion gap: 10 (ref 5–15)
BILIRUBIN TOTAL: 0.3 mg/dL (ref 0.3–1.2)
BUN: 20 mg/dL (ref 6–20)
CO2: 30 mmol/L (ref 22–32)
Calcium: 8.7 mg/dL — ABNORMAL LOW (ref 8.9–10.3)
Chloride: 96 mmol/L — ABNORMAL LOW (ref 101–111)
Creatinine, Ser: 1.71 mg/dL — ABNORMAL HIGH (ref 0.61–1.24)
GFR, EST AFRICAN AMERICAN: 51 mL/min — AB (ref >60)
GFR, EST NON AFRICAN AMERICAN: 44 mL/min — AB (ref >60)
Glucose, Bld: 106 mg/dL — ABNORMAL HIGH (ref 65–99)
POTASSIUM: 4.1 mmol/L (ref 3.5–5.1)
Sodium: 136 mmol/L (ref 135–145)
TOTAL PROTEIN: 7.5 g/dL (ref 6.5–8.1)

## 2015-03-02 ENCOUNTER — Telehealth (HOSPITAL_COMMUNITY): Payer: Self-pay | Admitting: Hematology & Oncology

## 2015-03-02 NOTE — Telephone Encounter (Signed)
Pc to Dr Dennard Schaumann spk w/ Gabriel Cirri re [facility] REF. Per billing clms were denied for no ref onfile. Ref was  Obtained on 9/30 and 10/10. I will e mail Tammi Klippel H a copy of the ref to submit to [facility] for reconsideration.  [facility] [phone]

## 2015-03-04 ENCOUNTER — Encounter (HOSPITAL_COMMUNITY): Payer: Self-pay

## 2015-03-04 ENCOUNTER — Encounter (HOSPITAL_BASED_OUTPATIENT_CLINIC_OR_DEPARTMENT_OTHER): Payer: 59

## 2015-03-04 VITALS — BP 145/92 | HR 82 | Temp 98.1°F | Resp 18 | Wt 212.0 lb

## 2015-03-04 DIAGNOSIS — C3491 Malignant neoplasm of unspecified part of right bronchus or lung: Secondary | ICD-10-CM | POA: Diagnosis not present

## 2015-03-04 MED ORDER — SODIUM CHLORIDE 0.9 % IV SOLN
INTRAVENOUS | Status: DC
  Administered 2015-03-04: 09:00:00 via INTRAVENOUS

## 2015-03-04 NOTE — Patient Instructions (Addendum)
[place] at [facility] Discharge Instructions  RECOMMENDATIONS MADE BY THE CONSULTANT AND ANY TEST RESULTS WILL BE SENT TO YOUR REFERRING PHYSICIAN.  We are stopping your Cisplatin and changing you to Carboplatin. Your creatinine (kidney test) keeps rising and we do not want to see this. We are switching you to Carboplatin which is not as toxic to the kidneys. You will continue with the other chemo drug you were originally taking (Etoposide).   Carboplatin - this medication can be hard on your kidneys - this is why we need you to drink     m 64 oz of fluid (preferably water/decaff fluids) 2 days prior to chemo and for up to 4-5 days after chemo. Drink more if you can. This will help to keep your kidneys flushed. This can cause mild hair loss, lower your platelets (which keep you from bleeding out when you cut yourself), lower your white blood cells (fight infection), and cause nausea/vomiting. (only takes 30 minutes to infuse)  VP16 (Etoposide) - this medication can lower your blood pressure. We will monitor this during chemo. Bone marrow suppression (lowers white blood cells (fight infection), lowers red blood cells (make up your blood), lowers platelets (help blood to clot). Hair loss, loss of appetite. (takes 1 hour to infuse)  Neulasta - this medication is not chemo but being given because you have had chemo. It will be given 27 hours after the completion of chemotherapy. This medication works by boosting your bone marrow's supply of white blood cells. White blood cells are what protect our bodies against infection. The medication is given in the form of a subcutaneous injection. It is given in the fatty tissue of your abdomen. It is a short needle. The major side effect of this medication is bone or muscle pain. The drug of choice to relieve or lessen the pain is Aleve or Ibuprofen. If a physician has ever told you not to take Aleve or Ibuprofen - then don't take it. You  should then take Tylenol/acetaminophen. Take either medication as the bottle directs you to.  The level of pain you experience as a result of this injection can range from none, to mild or moderate, or severe. Please let us know if you develop moderate or severe bone pain.        Thank you for choosing [place] at [facility] to provide your oncology and hematology care.   You need to re-schedule your appointment should you arrive 10 or more minutes late.  We strive to give you quality time with our providers, and arriving late affects you and other patients whose appointments are after yours.  Also, if you no show three or more times for appointments you may be dismissed from the clinic at the providers discretion.     Again, thank you for choosing [facility].  Our hope is that these requests will decrease the amount of time that you wait before being seen by our physicians.       _____________________________________________________________  Should you have questions after your visit to [facility], please contact our office at [phone] between the hours of 8:30 a.m. and 4:30 p.m.  Voicemails left after 4:30 p.m. will not be returned until the following business day.  For prescription refill requests, have your pharmacy contact our office.

## 2015-03-04 NOTE — Patient Instructions (Signed)
[place] at [facility] Discharge Instructions  RECOMMENDATIONS MADE BY THE CONSULTANT AND ANY TEST RESULTS WILL BE SENT TO YOUR REFERRING PHYSICIAN.  Today you received one liter of normal saline. Return as scheduled for lab work and office visit.   Thank you for choosing [place] at [facility] to provide your oncology and hematology care.  To afford each patient quality time with our provider, please arrive at least 15 minutes before your scheduled appointment time.    You need to re-schedule your appointment should you arrive 10 or more minutes late.  We strive to give you quality time with our providers, and arriving late affects you and other patients whose appointments are after yours.  Also, if you no show three or more times for appointments you may be dismissed from the clinic at the providers discretion.     Again, thank you for choosing [facility].  Our hope is that these requests will decrease the amount of time that you wait before being seen by our physicians.       _____________________________________________________________  Should you have questions after your visit to [facility], please contact our office at [phone] between the hours of 8:30 a.m. and 4:30 p.m.  Voicemails left after 4:30 p.m. will not be returned until the following business day.  For prescription refill requests, have your pharmacy contact our office.

## 2015-03-04 NOTE — Progress Notes (Signed)
NS 1 L infused.  A&Ox4; in no distress.  Discharged ambulatory.

## 2015-03-06 NOTE — Assessment & Plan Note (Addendum)
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  Oncology history is updated.  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  Additionally, given his extensive disease, Cisplatin chemotherapy has been discontinued and replaced with Carboplatin. As a result, treatment plan has been altered accordingly.  CT CAP with contrast following cycle #3 of chemotherapy.  Pre-chemo labs today: CBC diff, CMET  Patient educated on extensive stage small cell lung cancer.  Patients with extended stage disease, the median survival is 8 to 13 months, and the five-year survival rate is 1 to 2 percent.  In most series, less than 5 percent of those with Extensive Stage SCLC survive beyond two years.  Prophylactic cranial irradiation decreases the incidence of symptomatic brain metastases in patients who have responded to systemic chemotherapy, although its impact on overall survival is uncertain. For patients with a response to systemic chemotherapy, thoracic radiation may be of benefit in increasing the percentage of long-term survivors.    Patient education is printed for the patient and given to his wife regarding extensive stage disease.  It provides information regarding treatment, prognosis, and risk factors.  Refill on Hydrocodone provided.  Return in 3 weeks for follow-up and next cycle of chemotherapy.

## 2015-03-06 NOTE — Progress Notes (Signed)
[name] Fraction, MD [address] [place] [address]  Small cell lung cancer, right ([place]) - Plan: CT Abdomen Pelvis W Contrast, CT Chest W Contrast, HYDROcodone-acetaminophen (NORCO) 10-325 MG tablet  CURRENT THERAPY: S/P cycle 1 of Cisplatin/Etoposide.  Switching to Carboplatin/Etoposide for cycle #2.  INTERVAL HISTORY: [name] 54 y.o. 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    Small cell lung cancer ([place])   01/18/2015 Imaging Chest Xray- 6.8 cm x 7.6 cm RIGHT hilar mass most compatible with bronchogenic carcinoma.    01/18/2015 Imaging CT chest- 8.7 x 8.7 cm R hilar mass encasing the R pulm artery and occluding the R upper lobe bronchus. Sm R pleural effusion; malignant or reactive. 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 34 mm x 26 mm mass in body of pancreas   01/21/2015 Imaging MRI brain- No acute intracranial findings. No signs of metastatic disease. Minor white matter disease, stable and nonspecific.   01/26/2015 Pathology Results Lung, biopsy, right upper lobe - SMALL CELL CARCINOMA.   01/26/2015 Pathology Results Diagnosis FINE NEEDLE ASPIRATION: ENDOSCOPIC SPECIMEN A, EBUS LEVEL 7 NODE (SPECIMEN 1 OF 3 COLLECTED 01/26/2015) MALIGNANT CELLS PRESENT, CONSISTENT WITH SMALL CELL CARCINOMA.   01/26/2015 Pathology Results [name] NEEDLE ASPIRATION, SPECIMEN B LEVEL 7 NODE (SPECIMEN 2 OF 3 COLLECTED 01/26/2015) MALIGNANT CELLS PRESENT, CONSISTENT WITH SMALL CELL CARCINOMA.   01/26/2015 Pathology Results Diagnosis BRONCHIAL BRUSHING SPECIMEN C, RIGHT UPPER LOBE (SPECIMEN 3 OF 3 COLLECTED 01/26/2015) ATYPICAL CELLS PRESENT.   01/27/2015 PET scan Large hypermetabolic right hilar mass with mediastinal invasion and infrahilar extension, maximum standard uptake value 13.0.  Pancreatic tail mass, approximately 4.5 cm in diameter. Abdominal aortic  aneurysm, 5.8 cm in diameter.   02/10/2015 Pathology Results Diagnosis FINE NEEDLE ASPIRATION, ENDOSCOPIC, PANCREAS TAIL (SPECIMEN 1 OF 1 COLLECTED 02/10/15): MALIGNANT CELLS CONSISTENT WITH METASTATIC SMALL CELL CARCINOMA.   02/14/2015 - 03/06/2015 Chemotherapy Cisplatin/Etoposide   03/06/2015 Treatment Plan Change Change systemic therapy to Carboplatin based   03/07/2015 -  Chemotherapy Carboplatin/Etoposide.    I personally reviewed and went over laboratory results with the patient.  The results are noted within this dictation.  Labs will be updated today.  He meets parameters for treatment today.  Patient educated on "extensive stage" disease of small cell lung cancer.  He is educated on prognosis of extensive stage small cell lung cancer.  He is printed some patient education regarding extensive stage disease.  This was given to his wife.  He requests a refill on his Hydrocodone.  The patient's significant other had some questions regarding her own health and I have deferred this to her primary care provider.   Past Medical History  Diagnosis Date  . Coronary artery disease 2002    Lesion in LAD s/p angioplasty; s/p CABG x 4 Sept 2013  . Tobacco abuse   . Myocardial infarct [facility]) [id number]    stents  . Hyperlipidemia 1999  . Essential hypertension, benign 1999  . Lung mass   . COPD (chronic obstructive pulmonary disease) ([place])   . Shortness of breath dyspnea     with exertion, sleeps with more pillows under head  . Arthritis   . Pancreatitis   . Complication of anesthesia     woke up while being extubated and remembers fighting  . Cancer ([place])     lung  .  Mass of pancreas     has HYPERTENSION, BENIGN; CAD, NATIVE VESSEL; Mixed hyperlipidemia; Tobacco abuse; COPD (chronic obstructive pulmonary disease) ([place]); Lung mass-right; Pancreatic mass; Mass of pancreas; and Small cell lung cancer ([place]) on his problem list.     is allergic to lisinopril and tea.  Current Outpatient  Prescriptions on File Prior to Visit  Medication Sig Dispense Refill  . amLODipine (NORVASC) 5 MG tablet TAKE ONE (1) TABLET BY MOUTH EVERY DAY (Patient taking differently: TAKE ONE (1) TABLET BY MOUTH AT BEDTIME) 90 tablet 6  . aspirin EC 81 MG tablet Take 81 mg by mouth daily.    . carvedilol (COREG) 6.25 MG tablet TAKE ONE TABLET BY MOUTH TWICE A DAY WITH A MEAL 180 tablet 3  . fentaNYL (DURAGESIC - DOSED MCG/HR) 50 MCG/HR Place 1 patch (50 mcg total) onto the skin every 3 (three) days. 10 patch 0  . ibuprofen (ADVIL,MOTRIN) 200 MG tablet Take 600 mg by mouth every 6 (six) hours as needed for moderate pain.     Marland Kitchen levofloxacin (LEVAQUIN) 500 MG tablet Take one tablet by mouth daily until gone start tomorrow 02/15/2015 14 tablet 0  . levofloxacin (LEVAQUIN) 500 MG tablet     . ondansetron (ZOFRAN) 8 MG tablet Take 1 tablet (8 mg total) by mouth every 8 (eight) hours as needed for nausea or vomiting. 30 tablet 2  . pravastatin (PRAVACHOL) 40 MG tablet TAKE ONE (1) TABLET BY MOUTH EVERY DAY (Patient taking differently: TAKE ONE (1) TABLET BY MOUTH AT BEDTIME) 90 tablet 6  . prochlorperazine (COMPAZINE) 10 MG tablet Take 1 tablet (10 mg total) by mouth every 6 (six) hours as needed for nausea or vomiting. 30 tablet 2   Current Facility-Administered Medications on File Prior to Visit  Medication Dose Route Frequency Provider Last Rate Last Dose  . CARBOplatin (PARAPLATIN) 590 mg in sodium chloride 0.9 % 250 mL chemo infusion  590 mg Intravenous Once Baird Cancer, PA-C 618 mL/hr at 03/07/15 1109 590 mg at 03/07/15 1109  . etoposide (VEPESID) 210 mg in sodium chloride 0.9 % 600 mL chemo infusion  100 mg/m2 (Treatment Plan Actual) Intravenous Once [name], MD      . heparin lock flush 100 unit/mL  500 Units Intracatheter Once PRN [name], MD      . sodium chloride 0.9 % injection 10 mL  10 mL Intracatheter PRN [name], MD        Past Surgical History  Procedure  Laterality Date  . Knee surgery      left x 2  ACL repair  . Tee without cardioversion  01/02/2012    Procedure: TRANSESOPHAGEAL ECHOCARDIOGRAM (TEE);  Surgeon: [name], MD;  Location: [place];  Service: Open Heart Surgery;  Laterality: N/A;  . Multiple extractions with alveoloplasty  02/21/2012    Procedure: MULTIPLE EXTRACION WITH ALVEOLOPLASTY;  Surgeon: Lenn Cal, DDS;  Location: [facility];  Service: Oral Surgery;  Laterality: N/A;  Extaction of tooth #'s 4,5,6,7,10,11,12,13,14,15,19,20,21,22,23,24,25,   . Left heart catheterization with coronary angiogram N/A 01/01/2012    Procedure: LEFT HEART CATHETERIZATION WITH CORONARY ANGIOGRAM;  Surgeon: Hillary Bow, MD;  Location: [facility] CATH [facility];  Service: Cardiovascular;  Laterality: N/A;  . Coronary artery bypass graft  01/02/2012    Procedure: CORONARY ARTERY BYPASS GRAFTING (CABG);  Surgeon: [name], MD;  Location: [place];  Service: Open Heart Surgery;  Laterality: N/A;  times three using left internal mammary  artery and right endoscopically harvested saphenous vein  . Coronary angioplasty  1999, 2002  . Video bronchoscopy with endobronchial ultrasound N/A 01/26/2015    Procedure: VIDEO BRONCHOSCOPY WITH ENDOBRONCHIAL ULTRASOUND;  Surgeon: [name], MD;  Location: [place];  Service: Thoracic;  Laterality: N/A;  . Lung biopsy N/A 01/26/2015    Procedure: LUNG BIOPSY;  Surgeon: [name], MD;  Location: [place];  Service: Thoracic;  Laterality: N/A;  . Lymph node biopsy N/A 01/26/2015    Procedure: LYMPH NODE BIOPSY;  Surgeon: [name], MD;  Location: [place];  Service: Thoracic;  Laterality: N/A;  . Portacath placement Left 02/01/2015    Procedure: ATTEMPTED INSERTION OF PORT-A-CATH;  Surgeon: [name], MD;  Location: [place];  Service: Thoracic;  Laterality: Left;  . Eus N/A 02/10/2015    Procedure: UPPER ENDOSCOPIC ULTRASOUND (EUS) LINEAR;  Surgeon: [name], MD;  Location: [facility];   Service: Endoscopy;  Laterality: N/A;    Denies any headaches, dizziness, double vision, fevers, chills, night sweats, nausea, vomiting, diarrhea, constipation, chest pain, heart palpitations, shortness of breath, blood in stool, black tarry stool, urinary pain, urinary burning, urinary frequency, hematuria.   PHYSICAL EXAMINATION  ECOG PERFORMANCE STATUS: 0 - Asymptomatic  There were no vitals filed for this visit.  GENERAL:alert, no distress, well nourished, well developed, comfortable, cooperative and smiling, accompanied by his wife, and sitting in a chemo-recliner. SKIN: skin color, texture, turgor are normal, no rashes or significant lesions HEAD: Normocephalic, No masses, lesions, tenderness or abnormalities EYES: normal, PERRLA, EOMI, Conjunctiva are pink and non-injected EARS: External ears normal OROPHARYNX:lips, buccal mucosa, and tongue normal and mucous membranes are moist  NECK: supple, no adenopathy, trachea midline LYMPH:  no palpable lymphadenopathy BREAST:not examined LUNGS: clear to auscultation  HEART: regular rate & rhythm, no murmurs and no gallops ABDOMEN:abdomen soft and normal bowel sounds BACK: Back symmetric, no curvature. EXTREMITIES:less then 2 second capillary refill, no joint deformities, effusion, or inflammation, no skin discoloration  NEURO: alert & oriented x 3 with fluent speech, no focal motor/sensory deficits, gait normal   LABORATORY DATA: CBC    Component Value Date/Time   WBC 11.0* 03/07/2015 0845   RBC 3.84* 03/07/2015 0845   HGB 11.1* 03/07/2015 0845   HCT 32.7* 03/07/2015 0845   PLT 317 03/07/2015 0845   MCV 85.2 03/07/2015 0845   MCH 28.9 03/07/2015 0845   MCHC 33.9 03/07/2015 0845   RDW 14.9 03/07/2015 0845   LYMPHSABS 1.7 03/07/2015 0845   MONOABS 1.1* 03/07/2015 0845   EOSABS 0.2 03/07/2015 0845   BASOSABS 0.1 03/07/2015 0845      Chemistry      Component Value Date/Time   NA 135 03/07/2015 0845   K 4.3 03/07/2015 0845    CL 97* 03/07/2015 0845   CO2 30 03/07/2015 0845   BUN 18 03/07/2015 0845   CREATININE 1.20 03/07/2015 0845   CREATININE 0.99 01/27/2015 1450      Component Value Date/Time   CALCIUM 9.2 03/07/2015 0845   ALKPHOS 76 03/07/2015 0845   AST 14* 03/07/2015 0845   ALT 13* 03/07/2015 0845   BILITOT 0.3 03/07/2015 0845        PENDING LABS:   RADIOGRAPHIC STUDIES:  No results found.   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  Oncology history is updated.  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  Additionally, given his extensive disease, Cisplatin chemotherapy has been discontinued and replaced with Carboplatin. As a result, treatment plan has been altered accordingly.  CT CAP with contrast following cycle #3 of chemotherapy.  Pre-chemo labs today: CBC diff, CMET  Patient educated on extensive stage small cell lung cancer.  Patients with extended stage disease, the median survival is 8 to 13 months, and the five-year survival rate is 1 to 2 percent.  In most series, less than 5 percent of those with Extensive Stage SCLC survive beyond two years.  Prophylactic cranial irradiation decreases the incidence of symptomatic brain metastases in patients who have responded to systemic chemotherapy, although its impact on overall survival is uncertain. For patients with a response to systemic chemotherapy, thoracic radiation may be of benefit in increasing the percentage of long-term survivors.    Patient education is printed for the patient and given to his wife regarding extensive stage disease.  It provides information regarding treatment, prognosis, and risk factors.  Refill on Hydrocodone provided.  Return in 3 weeks for follow-up and next cycle of  chemotherapy.     THERAPY PLAN:  Continue treatment as planned.  We will restage him following cycle #3 with CT CAP.  All questions were answered. The patient knows to call the clinic with any problems, questions or concerns. We can certainly see the patient much sooner if necessary.  Patient and plan discussed with Dr. Ancil Linsey and she is in agreement with the aforementioned.   This note is electronically signed by: Doy Mince 03/07/2015 11:19 AM

## 2015-03-07 ENCOUNTER — Ambulatory Visit (HOSPITAL_COMMUNITY): Payer: Self-pay | Admitting: Oncology

## 2015-03-07 ENCOUNTER — Encounter (HOSPITAL_BASED_OUTPATIENT_CLINIC_OR_DEPARTMENT_OTHER): Payer: 59 | Admitting: Oncology

## 2015-03-07 ENCOUNTER — Encounter (HOSPITAL_COMMUNITY): Payer: Self-pay

## 2015-03-07 ENCOUNTER — Telehealth: Payer: Self-pay | Admitting: *Deleted

## 2015-03-07 ENCOUNTER — Encounter (HOSPITAL_BASED_OUTPATIENT_CLINIC_OR_DEPARTMENT_OTHER): Payer: 59

## 2015-03-07 VITALS — BP 119/84 | HR 76 | Temp 97.7°F | Resp 18 | Wt 208.0 lb

## 2015-03-07 DIAGNOSIS — Z0000 Encounter for general adult medical examination without abnormal findings: Secondary | ICD-10-CM | POA: Diagnosis not present

## 2015-03-07 DIAGNOSIS — N289 Disorder of kidney and ureter, unspecified: Secondary | ICD-10-CM

## 2015-03-07 DIAGNOSIS — C3411 Malignant neoplasm of upper lobe, right bronchus or lung: Secondary | ICD-10-CM

## 2015-03-07 DIAGNOSIS — C3491 Malignant neoplasm of unspecified part of right bronchus or lung: Secondary | ICD-10-CM

## 2015-03-07 DIAGNOSIS — Z5111 Encounter for antineoplastic chemotherapy: Secondary | ICD-10-CM | POA: Diagnosis not present

## 2015-03-07 DIAGNOSIS — K8689 Other specified diseases of pancreas: Secondary | ICD-10-CM

## 2015-03-07 DIAGNOSIS — Z0279 Encounter for issue of other medical certificate: Secondary | ICD-10-CM | POA: Diagnosis not present

## 2015-03-07 DIAGNOSIS — C3490 Malignant neoplasm of unspecified part of unspecified bronchus or lung: Secondary | ICD-10-CM

## 2015-03-07 LAB — CBC WITH DIFFERENTIAL/PLATELET
Basophils Absolute: 0.1 10*3/uL (ref 0.0–0.1)
Basophils Relative: 1 %
EOS PCT: 2 %
Eosinophils Absolute: 0.2 10*3/uL (ref 0.0–0.7)
HCT: 32.7 % — ABNORMAL LOW (ref 39.0–52.0)
HEMOGLOBIN: 11.1 g/dL — AB (ref 13.0–17.0)
LYMPHS ABS: 1.7 10*3/uL (ref 0.7–4.0)
LYMPHS PCT: 15 %
MCH: 28.9 pg (ref 26.0–34.0)
MCHC: 33.9 g/dL (ref 30.0–36.0)
MCV: 85.2 fL (ref 78.0–100.0)
Monocytes Absolute: 1.1 10*3/uL — ABNORMAL HIGH (ref 0.1–1.0)
Monocytes Relative: 10 %
NEUTROS PCT: 72 %
Neutro Abs: 8 10*3/uL — ABNORMAL HIGH (ref 1.7–7.7)
Platelets: 317 10*3/uL (ref 150–400)
RBC: 3.84 MIL/uL — AB (ref 4.22–5.81)
RDW: 14.9 % (ref 11.5–15.5)
WBC: 11 10*3/uL — AB (ref 4.0–10.5)

## 2015-03-07 LAB — COMPREHENSIVE METABOLIC PANEL
ALK PHOS: 76 U/L (ref 38–126)
ALT: 13 U/L — AB (ref 17–63)
AST: 14 U/L — ABNORMAL LOW (ref 15–41)
Albumin: 4 g/dL (ref 3.5–5.0)
Anion gap: 8 (ref 5–15)
BUN: 18 mg/dL (ref 6–20)
CALCIUM: 9.2 mg/dL (ref 8.9–10.3)
CO2: 30 mmol/L (ref 22–32)
CREATININE: 1.2 mg/dL (ref 0.61–1.24)
Chloride: 97 mmol/L — ABNORMAL LOW (ref 101–111)
Glucose, Bld: 84 mg/dL (ref 65–99)
Potassium: 4.3 mmol/L (ref 3.5–5.1)
Sodium: 135 mmol/L (ref 135–145)
Total Bilirubin: 0.3 mg/dL (ref 0.3–1.2)
Total Protein: 8.1 g/dL (ref 6.5–8.1)

## 2015-03-07 MED ORDER — PALONOSETRON HCL INJECTION 0.25 MG/5ML
0.2500 mg | Freq: Once | INTRAVENOUS | Status: AC
  Administered 2015-03-07: 0.25 mg via INTRAVENOUS

## 2015-03-07 MED ORDER — SODIUM CHLORIDE 0.9 % IV SOLN
Freq: Once | INTRAVENOUS | Status: AC
  Administered 2015-03-07: 10:00:00 via INTRAVENOUS

## 2015-03-07 MED ORDER — PALONOSETRON HCL INJECTION 0.25 MG/5ML
INTRAVENOUS | Status: AC
  Filled 2015-03-07: qty 5

## 2015-03-07 MED ORDER — ETOPOSIDE CHEMO INJECTION 1 GM/50ML
100.0000 mg/m2 | Freq: Once | INTRAVENOUS | Status: AC
  Administered 2015-03-07: 210 mg via INTRAVENOUS
  Filled 2015-03-07: qty 10.5

## 2015-03-07 MED ORDER — SODIUM CHLORIDE 0.9 % IJ SOLN: 10.0000 mL | INTRAMUSCULAR | Status: DC | PRN

## 2015-03-07 MED ORDER — HEPARIN SOD (PORK) LOCK FLUSH 100 UNIT/ML IV SOLN: 500.0000 [IU] | Freq: Once | INTRAVENOUS | Status: DC | PRN

## 2015-03-07 MED ORDER — FOSAPREPITANT DIMEGLUMINE INJECTION 150 MG
Freq: Once | INTRAVENOUS | Status: AC
  Administered 2015-03-07: 10:00:00 via INTRAVENOUS
  Filled 2015-03-07: qty 5

## 2015-03-07 MED ORDER — HYDROCODONE-ACETAMINOPHEN 10-325 MG PO TABS: 1.0000 | ORAL_TABLET | Freq: Four times a day (QID) | ORAL | Status: DC | PRN

## 2015-03-07 MED ORDER — SODIUM CHLORIDE 0.9 % IV SOLN
590.0000 mg | Freq: Once | INTRAVENOUS | Status: AC
  Administered 2015-03-07: 590 mg via INTRAVENOUS
  Filled 2015-03-07: qty 59

## 2015-03-07 NOTE — Patient Instructions (Addendum)
[place] at [facility] Discharge Instructions  RECOMMENDATIONS MADE BY THE CONSULTANT AND ANY TEST RESULTS WILL BE SENT TO YOUR REFERRING PHYSICIAN.  Exam and discussion by Robynn Pane, PA Call with fevers, uncontrolled nausea, vomiting or other concerns. Will do CT scans of your chest, abdomen and pelvis before cycle 4 of treatment  Follow up: Chemotherapy and office visits as scheduled.   Thank you for choosing [place] at [facility] to provide your oncology and hematology care.  To afford each patient quality time with our provider, please arrive at least 15 minutes before your scheduled appointment time.    You need to re-schedule your appointment should you arrive 10 or more minutes late.  We strive to give you quality time with our providers, and arriving late affects you and other patients whose appointments are after yours.  Also, if you no show three or more times for appointments you may be dismissed from the clinic at the providers discretion.     Again, thank you for choosing [facility].  Our hope is that these requests will decrease the amount of time that you wait before being seen by our physicians.       _____________________________________________________________  Should you have questions after your visit to [facility], please contact our office at [phone] between the hours of 8:30 a.m. and 4:30 p.m.  Voicemails left after 4:30 p.m. will not be returned until the following business day.  For prescription refill requests, have your pharmacy contact our office.

## 2015-03-07 NOTE — Progress Notes (Signed)
Ok to treat per Kirby Crigler PA  Roby Lofts Tolerated chemotherapy well today Discharged ambulatory

## 2015-03-07 NOTE — Telephone Encounter (Signed)
Submitted referral thru [facility] compass for referral to Dr. 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

## 2015-03-08 ENCOUNTER — Encounter (HOSPITAL_COMMUNITY): Payer: Self-pay

## 2015-03-08 ENCOUNTER — Encounter (HOSPITAL_BASED_OUTPATIENT_CLINIC_OR_DEPARTMENT_OTHER): Payer: 59

## 2015-03-08 ENCOUNTER — Telehealth: Payer: Self-pay | Admitting: *Deleted

## 2015-03-08 VITALS — BP 131/82 | HR 83 | Temp 98.2°F | Resp 18

## 2015-03-08 DIAGNOSIS — C3491 Malignant neoplasm of unspecified part of right bronchus or lung: Secondary | ICD-10-CM

## 2015-03-08 DIAGNOSIS — C3411 Malignant neoplasm of upper lobe, right bronchus or lung: Secondary | ICD-10-CM | POA: Diagnosis not present

## 2015-03-08 DIAGNOSIS — Z5111 Encounter for antineoplastic chemotherapy: Secondary | ICD-10-CM | POA: Diagnosis not present

## 2015-03-08 DIAGNOSIS — C3490 Malignant neoplasm of unspecified part of unspecified bronchus or lung: Secondary | ICD-10-CM

## 2015-03-08 DIAGNOSIS — K8689 Other specified diseases of pancreas: Secondary | ICD-10-CM

## 2015-03-08 MED ORDER — SODIUM CHLORIDE 0.9 % IV SOLN
100.0000 mg/m2 | Freq: Once | INTRAVENOUS | Status: AC
  Administered 2015-03-08: 210 mg via INTRAVENOUS
  Filled 2015-03-08: qty 10.5

## 2015-03-08 MED ORDER — SODIUM CHLORIDE 0.9 % IV SOLN
Freq: Once | INTRAVENOUS | Status: AC
  Administered 2015-03-08: 10:00:00 via INTRAVENOUS

## 2015-03-08 MED ORDER — SODIUM CHLORIDE 0.9 % IJ SOLN: 10.0000 mL | INTRAMUSCULAR | Status: DC | PRN

## 2015-03-08 MED ORDER — SODIUM CHLORIDE 0.9 % IV SOLN
Freq: Once | INTRAVENOUS | Status: AC
  Administered 2015-03-08: 10:00:00 via INTRAVENOUS
  Filled 2015-03-08: qty 5

## 2015-03-08 NOTE — Progress Notes (Signed)
[name]  tolerated chemotherapy well today Discharged ambulatory

## 2015-03-08 NOTE — Telephone Encounter (Signed)
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

## 2015-03-08 NOTE — Patient Instructions (Signed)
[facility] Discharge Instructions for Patients Receiving Chemotherapy  Today you received the following chemotherapy agents VP-16 Follow up as scheduled Please call the clinic if you have any questions or concerns  To help prevent nausea and vomiting after your treatment, we encourage you to take your nausea medication    If you develop nausea and vomiting, or diarrhea that is not controlled by your medication, call the clinic.  The clinic phone number is [phone]. Office hours are Monday-Friday 8:30am-5:00pm.  BELOW ARE SYMPTOMS THAT SHOULD BE REPORTED IMMEDIATELY:  *FEVER GREATER THAN 101.0 F  *CHILLS WITH OR WITHOUT FEVER  NAUSEA AND VOMITING THAT IS NOT CONTROLLED WITH YOUR NAUSEA MEDICATION  *UNUSUAL SHORTNESS OF BREATH  *UNUSUAL BRUISING OR BLEEDING  TENDERNESS IN MOUTH AND THROAT WITH OR WITHOUT PRESENCE OF ULCERS  *URINARY PROBLEMS  *BOWEL PROBLEMS  UNUSUAL RASH Items with * indicate a potential emergency and should be followed up as soon as possible. If you have an emergency after office hours please contact your primary care physician or go to the nearest emergency department.  Please call the clinic during office hours if you have any questions or concerns.   You may also contact the Patient Navigator at [phone] should you have any questions or need assistance in obtaining follow up care.

## 2015-03-09 ENCOUNTER — Encounter (HOSPITAL_COMMUNITY): Payer: Self-pay

## 2015-03-09 ENCOUNTER — Encounter (HOSPITAL_BASED_OUTPATIENT_CLINIC_OR_DEPARTMENT_OTHER): Payer: 59

## 2015-03-09 VITALS — BP 123/74 | HR 74 | Temp 97.8°F | Resp 18 | Wt 210.0 lb

## 2015-03-09 DIAGNOSIS — C3490 Malignant neoplasm of unspecified part of unspecified bronchus or lung: Secondary | ICD-10-CM

## 2015-03-09 DIAGNOSIS — K8689 Other specified diseases of pancreas: Secondary | ICD-10-CM

## 2015-03-09 DIAGNOSIS — C3411 Malignant neoplasm of upper lobe, right bronchus or lung: Secondary | ICD-10-CM | POA: Diagnosis not present

## 2015-03-09 DIAGNOSIS — Z5111 Encounter for antineoplastic chemotherapy: Secondary | ICD-10-CM

## 2015-03-09 MED ORDER — PEGFILGRASTIM 6 MG/0.6ML ~~LOC~~ PSKT
6.0000 mg | PREFILLED_SYRINGE | Freq: Once | SUBCUTANEOUS | Status: AC
  Administered 2015-03-09: 6 mg via SUBCUTANEOUS

## 2015-03-09 MED ORDER — SODIUM CHLORIDE 0.9 % IV SOLN
100.0000 mg/m2 | Freq: Once | INTRAVENOUS | Status: AC
  Administered 2015-03-09: 210 mg via INTRAVENOUS
  Filled 2015-03-09: qty 10.5

## 2015-03-09 MED ORDER — SODIUM CHLORIDE 0.9 % IV SOLN
Freq: Once | INTRAVENOUS | Status: AC
  Administered 2015-03-09: 10:00:00 via INTRAVENOUS
  Filled 2015-03-09: qty 5

## 2015-03-09 MED ORDER — SODIUM CHLORIDE 0.9 % IV SOLN
Freq: Once | INTRAVENOUS | Status: AC
  Administered 2015-03-09: 10:00:00 via INTRAVENOUS

## 2015-03-09 MED ORDER — SODIUM CHLORIDE 0.9 % IJ SOLN: 10.0000 mL | INTRAMUSCULAR | Status: DC | PRN

## 2015-03-09 MED ORDER — PEGFILGRASTIM 6 MG/0.6ML ~~LOC~~ PSKT
PREFILLED_SYRINGE | SUBCUTANEOUS | Status: AC
  Filled 2015-03-09: qty 0.6

## 2015-03-09 NOTE — Patient Instructions (Signed)
[place] at [facility] Discharge Instructions  RECOMMENDATIONS MADE BY THE CONSULTANT AND ANY TEST RESULTS WILL BE SENT TO YOUR REFERRING PHYSICIAN.  VP-16 today neulasta OBI placed today Will start infusing tomorrow at ______ Can take of at ______ Return as scheduled Please call the clinic if you have any questions or concerns  Thank you for choosing [place] at [facility] to provide your oncology and hematology care.  To afford each patient quality time with our provider, please arrive at least 15 minutes before your scheduled appointment time.    You need to re-schedule your appointment should you arrive 10 or more minutes late.  We strive to give you quality time with our providers, and arriving late affects you and other patients whose appointments are after yours.  Also, if you no show three or more times for appointments you may be dismissed from the clinic at the providers discretion.     Again, thank you for choosing [facility].  Our hope is that these requests will decrease the amount of time that you wait before being seen by our physicians.       _____________________________________________________________  Should you have questions after your visit to [facility], please contact our office at [phone] between the hours of 8:30 a.m. and 4:30 p.m.  Voicemails left after 4:30 p.m. will not be returned until the following business day.  For prescription refill requests, have your pharmacy contact our office.

## 2015-03-09 NOTE — Progress Notes (Signed)
[name] Tolerated chemotherapy well today Discharged ambulatory

## 2015-03-18 ENCOUNTER — Encounter (HOSPITAL_COMMUNITY): Payer: Self-pay | Admitting: Hematology & Oncology

## 2015-03-28 ENCOUNTER — Encounter (HOSPITAL_COMMUNITY): Payer: Self-pay | Admitting: Hematology & Oncology

## 2015-03-28 ENCOUNTER — Encounter (HOSPITAL_BASED_OUTPATIENT_CLINIC_OR_DEPARTMENT_OTHER): Payer: 59 | Admitting: Hematology & Oncology

## 2015-03-28 ENCOUNTER — Encounter (HOSPITAL_COMMUNITY): Payer: 59 | Attending: Hematology & Oncology

## 2015-03-28 VITALS — BP 123/76 | HR 73 | Temp 97.8°F | Resp 18

## 2015-03-28 VITALS — BP 133/77 | HR 85 | Temp 98.3°F | Resp 18 | Wt 214.0 lb

## 2015-03-28 DIAGNOSIS — Z720 Tobacco use: Secondary | ICD-10-CM

## 2015-03-28 DIAGNOSIS — C3411 Malignant neoplasm of upper lobe, right bronchus or lung: Secondary | ICD-10-CM

## 2015-03-28 DIAGNOSIS — K869 Disease of pancreas, unspecified: Secondary | ICD-10-CM | POA: Diagnosis present

## 2015-03-28 DIAGNOSIS — R918 Other nonspecific abnormal finding of lung field: Secondary | ICD-10-CM | POA: Diagnosis present

## 2015-03-28 DIAGNOSIS — D6481 Anemia due to antineoplastic chemotherapy: Secondary | ICD-10-CM

## 2015-03-28 DIAGNOSIS — Z0000 Encounter for general adult medical examination without abnormal findings: Secondary | ICD-10-CM | POA: Diagnosis present

## 2015-03-28 DIAGNOSIS — C3490 Malignant neoplasm of unspecified part of unspecified bronchus or lung: Secondary | ICD-10-CM

## 2015-03-28 DIAGNOSIS — C3491 Malignant neoplasm of unspecified part of right bronchus or lung: Secondary | ICD-10-CM | POA: Diagnosis present

## 2015-03-28 DIAGNOSIS — K8689 Other specified diseases of pancreas: Secondary | ICD-10-CM

## 2015-03-28 DIAGNOSIS — Z5111 Encounter for antineoplastic chemotherapy: Secondary | ICD-10-CM | POA: Diagnosis not present

## 2015-03-28 LAB — CBC WITH DIFFERENTIAL/PLATELET
Basophils Absolute: 0.1 10*3/uL (ref 0.0–0.1)
Basophils Relative: 1 %
Eosinophils Absolute: 0.2 10*3/uL (ref 0.0–0.7)
Eosinophils Relative: 2 %
HEMATOCRIT: 29.3 % — AB (ref 39.0–52.0)
HEMOGLOBIN: 9.6 g/dL — AB (ref 13.0–17.0)
LYMPHS ABS: 2.1 10*3/uL (ref 0.7–4.0)
Lymphocytes Relative: 23 %
MCH: 28.4 pg (ref 26.0–34.0)
MCHC: 32.8 g/dL (ref 30.0–36.0)
MCV: 86.7 fL (ref 78.0–100.0)
MONOS PCT: 10 %
Monocytes Absolute: 0.9 10*3/uL (ref 0.1–1.0)
NEUTROS ABS: 5.8 10*3/uL (ref 1.7–7.7)
NEUTROS PCT: 64 %
Platelets: 176 10*3/uL (ref 150–400)
RBC: 3.38 MIL/uL — ABNORMAL LOW (ref 4.22–5.81)
RDW: 18.7 % — ABNORMAL HIGH (ref 11.5–15.5)
WBC: 9.1 10*3/uL (ref 4.0–10.5)

## 2015-03-28 LAB — URINALYSIS, ROUTINE W REFLEX MICROSCOPIC
Bilirubin Urine: NEGATIVE
GLUCOSE, UA: NEGATIVE mg/dL
Hgb urine dipstick: NEGATIVE
Ketones, ur: NEGATIVE mg/dL
LEUKOCYTES UA: NEGATIVE
Nitrite: NEGATIVE
PH: 6 (ref 5.0–8.0)
Protein, ur: NEGATIVE mg/dL
Specific Gravity, Urine: <1.005 — ABNORMAL LOW (ref 1.005–1.030)

## 2015-03-28 LAB — COMPREHENSIVE METABOLIC PANEL
ALK PHOS: 73 U/L (ref 38–126)
ALT: 14 U/L — ABNORMAL LOW (ref 17–63)
ANION GAP: 8 (ref 5–15)
AST: 14 U/L — ABNORMAL LOW (ref 15–41)
Albumin: 4.2 g/dL (ref 3.5–5.0)
BILIRUBIN TOTAL: 0.6 mg/dL (ref 0.3–1.2)
BUN: 11 mg/dL (ref 6–20)
CO2: 28 mmol/L (ref 22–32)
Calcium: 9 mg/dL (ref 8.9–10.3)
Chloride: 100 mmol/L — ABNORMAL LOW (ref 101–111)
Creatinine, Ser: 1 mg/dL (ref 0.61–1.24)
GFR calc non Af Amer: >60 mL/min (ref >60)
Glucose, Bld: 87 mg/dL (ref 65–99)
Potassium: 3.9 mmol/L (ref 3.5–5.1)
Sodium: 136 mmol/L (ref 135–145)
Total Protein: 7.9 g/dL (ref 6.5–8.1)

## 2015-03-28 MED ORDER — SODIUM CHLORIDE 0.9 % IV SOLN
Freq: Once | INTRAVENOUS | Status: AC
  Administered 2015-03-28: 11:00:00 via INTRAVENOUS

## 2015-03-28 MED ORDER — SODIUM CHLORIDE 0.9 % IV SOLN
Freq: Once | INTRAVENOUS | Status: AC
  Administered 2015-03-28: 11:00:00 via INTRAVENOUS
  Filled 2015-03-28: qty 5

## 2015-03-28 MED ORDER — PALONOSETRON HCL INJECTION 0.25 MG/5ML
0.2500 mg | Freq: Once | INTRAVENOUS | Status: AC
  Administered 2015-03-28: 0.25 mg via INTRAVENOUS
  Filled 2015-03-28: qty 5

## 2015-03-28 MED ORDER — ETOPOSIDE CHEMO INJECTION 1 GM/50ML
100.0000 mg/m2 | Freq: Once | INTRAVENOUS | Status: AC
  Administered 2015-03-28: 210 mg via INTRAVENOUS
  Filled 2015-03-28: qty 10.5

## 2015-03-28 MED ORDER — SODIUM CHLORIDE 0.9 % IJ SOLN: 10.0000 mL | INTRAMUSCULAR | Status: DC | PRN

## 2015-03-28 MED ORDER — SODIUM CHLORIDE 0.9 % IV SOLN
684.0000 mg | Freq: Once | INTRAVENOUS | Status: AC
  Administered 2015-03-28: 680 mg via INTRAVENOUS
  Filled 2015-03-28: qty 68

## 2015-03-28 NOTE — Patient Instructions (Addendum)
[place] at [facility] Discharge Instructions  RECOMMENDATIONS MADE BY THE CONSULTANT AND ANY TEST RESULTS WILL BE SENT TO YOUR REFERRING PHYSICIAN.     Exam completed by Dr Whitney Muse today Treatment today if blood work is good Day 1-3 this week Please think about stopping smoking Return in 3 weeks for treatment again Scans after this treatment Try to do 4-6 treatments. After scans we can decide about radiation Return to see the doctor after the scans Please call the clinic if you have any questions or concerns    Thank you for choosing [place] at [facility] to provide your oncology and hematology care.  To afford each patient quality time with our provider, please arrive at least 15 minutes before your scheduled appointment time.    You need to re-schedule your appointment should you arrive 10 or more minutes late.  We strive to give you quality time with our providers, and arriving late affects you and other patients whose appointments are after yours.  Also, if you no show three or more times for appointments you may be dismissed from the clinic at the providers discretion.     Again, thank you for choosing [facility].  Our hope is that these requests will decrease the amount of time that you wait before being seen by our physicians.       _____________________________________________________________  Should you have questions after your visit to [facility], please contact our office at [phone] between the hours of 8:30 a.m. and 4:30 p.m.  Voicemails left after 4:30 p.m. will not be returned until the following business day.  For prescription refill requests, have your pharmacy contact our office.

## 2015-03-28 NOTE — Patient Instructions (Signed)
[facility] Discharge Instructions for Patients Receiving Chemotherapy  Today you received the following chemotherapy agents: etoposide and carboplatin.   Please call us with any questions or concerns.     If you develop nausea and vomiting, or diarrhea that is not controlled by your medication, call the clinic.  The clinic phone number is [phone]. Office hours are Monday-Friday 8:30am-5:00pm.  BELOW ARE SYMPTOMS THAT SHOULD BE REPORTED IMMEDIATELY:  *FEVER GREATER THAN 101.0 F  *CHILLS WITH OR WITHOUT FEVER  NAUSEA AND VOMITING THAT IS NOT CONTROLLED WITH YOUR NAUSEA MEDICATION  *UNUSUAL SHORTNESS OF BREATH  *UNUSUAL BRUISING OR BLEEDING  TENDERNESS IN MOUTH AND THROAT WITH OR WITHOUT PRESENCE OF ULCERS  *URINARY PROBLEMS  *BOWEL PROBLEMS  UNUSUAL RASH Items with * indicate a potential emergency and should be followed up as soon as possible. If you have an emergency after office hours please contact your primary care physician or go to the nearest emergency department.  Please call the clinic during office hours if you have any questions or concerns.   You may also contact the Patient Navigator at [phone] should you have any questions or need assistance in obtaining follow up care.

## 2015-03-28 NOTE — Progress Notes (Signed)
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    01/18/2015 Imaging CT chest- 8.7 x 8.7 cm R hilar mass encasing the R pulm artery and occluding the R upper lobe bronchus. Sm R pleural effusion; malignant or reactive. 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 34 mm x 26 mm mass in body of pancreas   01/21/2015 Imaging MRI brain- No acute intracranial findings. No signs of metastatic disease. Minor white matter disease, stable and nonspecific.   01/26/2015 Pathology Results Lung, biopsy, right upper lobe - SMALL CELL CARCINOMA.   01/26/2015 Pathology Results Diagnosis FINE NEEDLE ASPIRATION: ENDOSCOPIC SPECIMEN A, EBUS LEVEL 7 NODE (SPECIMEN 1 OF 3 COLLECTED 01/26/2015) MALIGNANT CELLS PRESENT, CONSISTENT WITH SMALL CELL CARCINOMA.   01/26/2015 Pathology Results WANG NEEDLE ASPIRATION, SPECIMEN B LEVEL 7 NODE (SPECIMEN 2 OF 3 COLLECTED 01/26/2015) MALIGNANT CELLS PRESENT, CONSISTENT WITH SMALL CELL CARCINOMA.   01/26/2015  Pathology Results Diagnosis BRONCHIAL BRUSHING SPECIMEN C, RIGHT UPPER LOBE (SPECIMEN 3 OF 3 COLLECTED 01/26/2015) ATYPICAL CELLS PRESENT.   01/27/2015 PET scan Large hypermetabolic right hilar mass with mediastinal invasion and infrahilar extension, maximum standard uptake value 13.0.  Pancreatic tail mass, approximately 4.5 cm in diameter. Abdominal aortic aneurysm, 5.8 cm in diameter.   02/10/2015 Pathology Results Pancreatic biopsy via EUS:    02/14/2015 - 03/06/2015 Chemotherapy Cisplatin/Etoposide              HISTORY OF PRESENTING ILLNESS:  [name] Lamarque89.o. male is here for further up of extensive stage SCLC.   Mr. PeStirewalts here today with his wife, ShRachel MouldsWhen asked how he's doing upon entry to the room, he remarks "I can't complain." He says he's been eating fine, breathing fine, and feeling okay in general.  His wife says he's able to get up and go to the mailbox, etc. She also says he has days where he wants to be lazy, but that he's breathing better and doing better overall. He says he's also done better in terms of nausea this time around; he's only used nausea medicine three times altogether. His wife confirms this.  When it comes to smoking, he says he's doing a whole lot better than what he was; he smokes a pack every two days. He says he's working on doing even better. He does continue to think of excuses to not quit.  He adds that he had a cough on Friday night at the football game, because it was so cold.  He says he's been working on days when he's able, and that  work has been supportive.  He thinks he's been doing okay with the IV's, in terms of his veins, but seems that he may be open to a port.  He is concerned about his cancer metastasizing into his brain, thanks to a scary experience with someone in the past who suffered from brain tumors.  He does add that he hasn't been able to sleep through a night ever since he began treatment.   MEDICAL HISTORY:  Past  Medical History  Diagnosis Date  . Coronary artery disease 2002    Lesion in LAD s/p angioplasty; s/p CABG x 4 Sept 2013  . Tobacco abuse   . Myocardial infarct [facility]) [id number]    stents  . Hyperlipidemia 1999  . Essential hypertension, benign 1999  . Lung mass   . COPD (chronic obstructive pulmonary disease) ([place])   . Shortness of breath dyspnea     with exertion, sleeps with more pillows under head  . Arthritis   . Pancreatitis   . Complication of anesthesia     woke up while being extubated and remembers fighting  . Cancer ([place])     lung  . Mass of pancreas     SURGICAL HISTORY: Past Surgical History  Procedure Laterality Date  . Knee surgery      left x 2  ACL repair  . Tee without cardioversion  01/02/2012    Procedure: TRANSESOPHAGEAL ECHOCARDIOGRAM (TEE);  Surgeon: [name], MD;  Location: [facility];  Service: Open Heart Surgery;  Laterality: N/A;  . Multiple extractions with alveoloplasty  02/21/2012    Procedure: MULTIPLE EXTRACION WITH ALVEOLOPLASTY;  Surgeon: Lenn Cal, DDS;  Location: [facility];  Service: Oral Surgery;  Laterality: N/A;  Extaction of tooth #'s 4,5,6,7,10,11,12,13,14,15,19,20,21,22,23,24,25,   . Left heart catheterization with coronary angiogram N/A 01/01/2012    Procedure: LEFT HEART CATHETERIZATION WITH CORONARY ANGIOGRAM;  Surgeon: Hillary Bow, MD;  Location: [facility] C[facility];  Service: Cardiovascular;  Laterality: N/A;  . Coronary artery bypass graft  01/02/2012    Procedure: CORONARY ARTERY BYPASS GRAFTING (CABG);  Surgeon: [name], MD;  Location: [facility];  Service: Open Heart Surgery;  Laterality: N/A;  times three using left internal mammary artery and right endoscopically harvested saphenous vein  . Coronary angioplasty  1999, 2002  . Video bronchoscopy with endobronchial ultrasound N/A 01/26/2015    Procedure: VIDEO BRONCHOSCOPY WITH ENDOBRONCHIAL ULTRASOUND;  Surgeon: [name], MD;  Location: [facility];  Service: Thoracic;   Laterality: N/A;  . Lung biopsy N/A 01/26/2015    Procedure: LUNG BIOPSY;  Surgeon: [name], MD;  Location: [facility];  Service: Thoracic;  Laterality: N/A;  . Lymph node biopsy N/A 01/26/2015    Procedure: LYMPH NODE BIOPSY;  Surgeon: [name], MD;  Location: [facility];  Service: Thoracic;  Laterality: N/A;  . Portacath placement Left 02/01/2015    Procedure: ATTEMPTED INSERTION OF PORT-A-CATH;  Surgeon: [name], MD;  Location: [facility];  Service: Thoracic;  Laterality: Left;  . Eus N/A 02/10/2015    Procedure: UPPER ENDOSCOPIC ULTRASOUND (EUS) LINEAR;  Surgeon: [name], MD;  [facility];  Service: Endoscopy;  Laterality: N/A;    SOCIAL HISTORY: Social History   Social History  . Marital Status: Married    Spouse Name: N/A  . Number of Children: 5  . Years of Education: N/A   Occupational History  . Works in [employer]  .  Smoking status: Current Every Day Smoker -- 1.00 packs/day for 35 years    Types: Cigarettes    Start date: 06/30/1976  . Smokeless tobacco: Never Used  . Alcohol Use: Yes     Comment: maybe a 12 pack/mth if that - very rare since 2013  . Drug Use: No  . Sexual Activity: Yes   Other Topics Concern  . Not on file   Social History Narrative   Darral Dash been married 17 years, with five kids.  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 Cancer Neg Hx   . Heart disease Father   . Diabetes Father   . Hyperlipidemia Father   . Hypertension Father   . Hyperlipidemia Mother   . Hypertension Mother     . Stroke Mother   . Diabetes Paternal Grandmother    indicated that his mother is alive. He indicated that his father is alive. He indicated that his sister is alive. He indicated that his brother is alive. He indicated that his maternal grandmother is deceased. He indicated that his maternal grandfather is deceased. He indicated that his paternal grandmother is deceased. He indicated that his paternal grandfather is deceased.   His father's somewhere in [place]; his mother lives here in [place] and works at [employer] 76 at the end of this month She's got high blood pressure, high cholesterol, no strokes that they know of; is on blood thinners Has an aneurism at the base of the aorta where it goes into your stomach He knows his father's had high blood pressure and diabetes; Parents have been divorced since he was 3-4 Has half brothers / half sisters   ALLERGIES:  is allergic to lisinopril and tea.  MEDICATIONS:  Current Outpatient Prescriptions  Medication Sig Dispense Refill  . amLODipine (NORVASC) 5 MG tablet TAKE ONE (1) TABLET BY MOUTH EVERY DAY (Patient taking differently: TAKE ONE (1) TABLET BY MOUTH AT BEDTIME) 90 tablet 6  . aspirin EC 81 MG tablet Take 81 mg by mouth daily.    . carvedilol (COREG) 6.25 MG tablet TAKE ONE TABLET BY MOUTH TWICE A DAY WITH A MEAL 180 tablet 3  . HYDROcodone-acetaminophen (NORCO) 10-325 MG tablet Take 1 tablet by mouth every 6 (six) hours as needed. 90 tablet 0  . ondansetron (ZOFRAN) 8 MG tablet Take 1 tablet (8 mg total) by mouth every 8 (eight) hours as needed for nausea or vomiting. 30 tablet 2  . pravastatin (PRAVACHOL) 40 MG tablet TAKE ONE (1) TABLET BY MOUTH EVERY DAY (Patient taking differently: TAKE ONE (1) TABLET BY MOUTH AT BEDTIME) 90 tablet 6  . fentaNYL (DURAGESIC - DOSED MCG/HR) 50 MCG/HR Place 1 patch (50 mcg total) onto the skin every 3 (three) days. (Patient not taking: Reported on 03/28/2015) 10 patch 0  . ibuprofen  (ADVIL,MOTRIN) 200 MG tablet Take 600 mg by mouth every 6 (six) hours as needed for moderate pain.     Marland Kitchen levofloxacin (LEVAQUIN) 500 MG tablet Take one tablet by mouth daily until  gone start tomorrow 02/15/2015 (Patient not taking: Reported on 03/28/2015) 14 tablet 0  . levofloxacin (LEVAQUIN) 500 MG tablet     . prochlorperazine (COMPAZINE) 10 MG tablet Take 1 tablet (10 mg total) by mouth every 6 (six) hours as needed for nausea or vomiting. (Patient not taking: Reported on 03/28/2015) 30 tablet 2   No current facility-administered medications for this visit.   Facility-Administered Medications Ordered in Other Visits  Medication Dose Route Frequency Provider Last Rate Last Dose  . CARBOplatin (PARAPLATIN) 680 mg in sodium chloride 0.9 % 250 mL chemo infusion  680 mg Intravenous Once [name], MD      . etoposide (VEPESID) 210 mg in sodium chloride 0.9 % 600 mL chemo infusion  100 mg/m2 (Treatment Plan Actual) Intravenous Once [name], MD      . fosaprepitant (EMEND) 150 mg, dexamethasone (DECADRON) 10 mg in sodium chloride 0.9 % 145 mL IVPB   Intravenous Once [name], MD      . sodium chloride 0.9 % injection 10 mL  10 mL Intracatheter PRN [name], MD        Review of Systems  Constitutional: Negative for fever, chills, weight loss and malaise/fatigue.  HENT: Negative for congestion, hearing loss, nosebleeds, sore throat and tinnitus.   Eyes: Negative for blurred vision, double vision, pain and discharge.  Respiratory: Negative for hemoptysis, cough, shortness of breath and wheezing.   Cardiovascular: Negative for palpitations, claudication, leg swelling and PND.  Gastrointestinal: Negative for heartburn, nausea, vomiting, abdominal pain, diarrhea, constipation, blood in stool and melena.  Genitourinary: Negative for dysuria, urgency, frequency and hematuria.  Musculoskeletal: Positive for joint pain. Negative for myalgias and falls.       Has had knee  surgeries.  Skin: Negative for itching and rash.  Neurological: Negative for dizziness, tingling, tremors, sensory change, speech change, focal weakness, seizures, loss of consciousness, weakness and headaches.  Endo/Heme/Allergies: Does not bruise/bleed easily.  Psychiatric/Behavioral: Negative for depression, suicidal ideas, memory loss and substance abuse. The patient is not nervous/anxious and does not have insomnia.   All other systems reviewed and are negative.  14 point ROS was done and is otherwise as detailed above or in HPI   PHYSICAL EXAMINATION: ECOG PERFORMANCE STATUS: 1 - Symptomatic but completely ambulatory  Vitals with BMI 02/14/2015  Height   Weight 219 lbs 3 oz  BMI   Systolic 361  Diastolic 69  Pulse 443  Respirations 22   Physical Exam  Constitutional: He is oriented to person, place, and time and well-developed, well-nourished, and in no distress. Alopecia  HENT:  Head: Normocephalic and atraumatic.  Nose: Nose normal.  Mouth/Throat: Oropharynx is clear and moist. No oropharyngeal exudate.  Dentures on top and bottom.  Eyes: Conjunctivae and EOM are normal. Pupils are equal, round, and reactive to light. Right eye exhibits no discharge. Left eye exhibits no discharge. No scleral icterus.  Neck: Normal range of motion. Neck supple. No tracheal deviation present. No thyromegaly present.  Cardiovascular: Normal rate, regular rhythm and normal heart sounds.  Exam reveals no gallop and no friction rub.   No murmur heard. Pulmonary/Chest: Effort normal. He has no wheezes. He has no rales.  Abdominal: Soft. Bowel sounds are normal. He exhibits no distension and no mass. There is no tenderness. There is no rebound and no guarding.  Musculoskeletal: Normal range of motion. He exhibits no edema.  Lymphadenopathy:    He has no cervical adenopathy.  Neurological: He  is alert and oriented to person, place, and time. He has normal reflexes. No cranial nerve deficit. Gait  normal. Coordination normal.  Skin: Skin is warm and dry. No rash noted.  Psychiatric: Mood, memory, affect and judgment normal.  Nursing note and vitals reviewed.   LABORATORY DATA:  I have reviewed the data as listed.  CBC    Component Value Date/Time   WBC 9.1 03/28/2015 0830   RBC 3.38* 03/28/2015 0830   HGB 9.6* 03/28/2015 0830   HCT 29.3* 03/28/2015 0830   PLT 176 03/28/2015 0830   MCV 86.7 03/28/2015 0830   MCH 28.4 03/28/2015 0830   MCHC 32.8 03/28/2015 0830   RDW 18.7* 03/28/2015 0830   LYMPHSABS 2.1 03/28/2015 0830   MONOABS 0.9 03/28/2015 0830   EOSABS 0.2 03/28/2015 0830   BASOSABS 0.1 03/28/2015 0830   CMP     Component Value Date/Time   NA 136 03/28/2015 0830   K 3.9 03/28/2015 0830   CL 100* 03/28/2015 0830   CO2 28 03/28/2015 0830   GLUCOSE 87 03/28/2015 0830   BUN 11 03/28/2015 0830   CREATININE 1.00 03/28/2015 0830   CREATININE 0.99 01/27/2015 1450   CALCIUM 9.0 03/28/2015 0830   PROT 7.9 03/28/2015 0830   ALBUMIN 4.2 03/28/2015 0830   AST 14* 03/28/2015 0830   ALT 14* 03/28/2015 0830   ALKPHOS 73 03/28/2015 0830   BILITOT 0.6 03/28/2015 0830   GFRNONAA >60 03/28/2015 0830   GFRNONAA 87 01/27/2015 1450   GFRAA >60 03/28/2015 0830   GFRAA >89 01/27/2015 1450    RADIOGRAPHIC STUDIES: I have personally reviewed the radiological images as listed and agreed with the findings in the report.  Study Result     CLINICAL DATA: Initial treatment strategy for right upper lobe small cell carcinoma.  EXAM: NUCLEAR MEDICINE PET SKULL BASE TO THIGH  TECHNIQUE: 10.9 mCi F-18 FDG was injected intravenously. Full-ring PET imaging was performed from the skull base to thigh after the radiotracer. CT data was obtained and used for attenuation correction and anatomic localization.  FASTING BLOOD GLUCOSE: Value: 98 mg/dl  COMPARISON: 01/18/2015  FINDINGS: NECK  Bilateral tonsillar pillar activity appears symmetric and  probably physiologic.  CHEST  Right hilar mass maximum standard uptake value 13.0. Similar appearance to prior with regard to occlusion of the right upper lobe tracheobronchial tree and infrahilar and mediastinal extension. Peribronchovascular nodularity in the right upper lobe demonstrates low-level activity, maximum standard uptake value approximately 2.6, possibly from malignancy or postobstructive pneumonitis. Underlying emphysema. Peripherally increased signal along the increasing region of consolidation in the peripheral right upper lobe on image 77 series 4, maximum standard uptake value 3.8.  ABDOMEN/PELVIS  Likely physiologic activity along the cardia of the stomach. Previously described mass in the pancreatic tail has a maximum standard uptake value 12.6. This appears separate from the left adrenal gland. No hypermetabolic liver lesions.  Infrarenal abdominal aortic aneurysm measuring 5.8 cm in diameter on image 143 series 4, fusiform and with peripheral atherosclerosis. High activity along the right T12 transverse process as along the articulation with the rib and probably degenerative.  SKELETON  No focal hypermetabolic activity to suggest skeletal metastasis.  IMPRESSION: 1. Large hypermetabolic right hilar mass with mediastinal invasion and infrahilar extension, maximum standard uptake value 13.0. 2. Patchy opacities with increased activity in the apical segment right upper lobe and especially peripherally along the posterior inferior margin of the right upper lobe, potentially from tumor extension or postobstructive pneumonitis. 3. Pancreatic tail mass,  approximately 4.5 cm in diameter, could be a metastatic lesion or a primary pancreatic adenocarcinoma. No liver metastatic lesion identified. 4. Abdominal aortic aneurysm, 5.8 cm in diameter. Vascular surgery consultation recommended due to increased risk of rupture for AAA >5.5 cm. This recommendation  follows ACR consensus guidelines: White Paper of the ACR Incidental Findings Committee II on Vascular Findings. J Am Coll Radiol 2013; 10:789-794.   Electronically Signed  By: Van Clines M.D.  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 Large hypermetabolic right hilar mass with mediastinal invasion and infrahilar extension  Hyponatremia c/w SIADH  EUS with Dr. JaArdis Hughsnd pancreatic biopsy on 02/10/2015 c/w SCLC Anemia secondary to chemotherapy  He will continue with cycle #3 of carboplatin/etoposide.  He is doing very well with therapy. Cough is improved. He still works as able.  He has cut back on smoking but still smokes 1/2 pack daily.  I have encouraged him to continue to try to quit.  Repeat imaging has already been ordered for the 21st.  I will see him back post to review and for additional recommendations.  Plan is for 6 cycles with consideration of WBRT after chemotherapy completion.   He says he's good in terms of refills.  Hyponatremia has resolved c/w response to therapy.  He has developed anemia, secondary to chemotherapy, and we will monitor moving forward. He is asymptomatic.    Orders Placed This Encounter  Procedures  . Urinalysis, Routine w reflex microscopic    Standing Status: Future     Number of Occurrences: 1     Standing Expiration Date: 03/27/2016   All questions were answered. The patient knows to call the clinic with any  problems, questions or concerns.  This document serves as a record of services personally performed by ShAncil LinseyMD. It was created on her behalf by KaToni Amenda trained medical scribe. The creation of this record is based on the scribe's personal observations and the provider's statements to them. This document has been checked and approved by the attending provider.  I have reviewed the above documentation for accuracy and completeness, and I agree with the above.  This note was electronically signed.    ShKelby FamPenland, MD   03/28/2015 10:54 AM

## 2015-03-29 ENCOUNTER — Encounter (HOSPITAL_BASED_OUTPATIENT_CLINIC_OR_DEPARTMENT_OTHER): Payer: 59

## 2015-03-29 VITALS — BP 144/80 | HR 90 | Temp 98.0°F | Resp 18

## 2015-03-29 DIAGNOSIS — C3411 Malignant neoplasm of upper lobe, right bronchus or lung: Secondary | ICD-10-CM

## 2015-03-29 DIAGNOSIS — K8689 Other specified diseases of pancreas: Secondary | ICD-10-CM

## 2015-03-29 DIAGNOSIS — C3490 Malignant neoplasm of unspecified part of unspecified bronchus or lung: Secondary | ICD-10-CM

## 2015-03-29 DIAGNOSIS — Z5111 Encounter for antineoplastic chemotherapy: Secondary | ICD-10-CM | POA: Diagnosis not present

## 2015-03-29 MED ORDER — FOSAPREPITANT DIMEGLUMINE INJECTION 150 MG
Freq: Once | INTRAVENOUS | Status: AC
  Administered 2015-03-29: 10:00:00 via INTRAVENOUS
  Filled 2015-03-29: qty 5

## 2015-03-29 MED ORDER — SODIUM CHLORIDE 0.9 % IV SOLN
Freq: Once | INTRAVENOUS | Status: AC
  Administered 2015-03-29: 09:00:00 via INTRAVENOUS

## 2015-03-29 MED ORDER — SODIUM CHLORIDE 0.9 % IV SOLN
100.0000 mg/m2 | Freq: Once | INTRAVENOUS | Status: AC
  Administered 2015-03-29: 210 mg via INTRAVENOUS
  Filled 2015-03-29: qty 10.5

## 2015-03-29 MED ORDER — SODIUM CHLORIDE 0.9 % IJ SOLN: 10.0000 mL | INTRAMUSCULAR | Status: DC | PRN

## 2015-03-29 MED ORDER — HEPARIN SOD (PORK) LOCK FLUSH 100 UNIT/ML IV SOLN: 500.0000 [IU] | Freq: Once | INTRAVENOUS | Status: DC | PRN

## 2015-03-29 NOTE — Progress Notes (Signed)
Patient tolerated infusion well.  VSS.  IV wrapped with gauze for tomorrow's infusion.

## 2015-03-29 NOTE — Patient Instructions (Signed)
[facility] Discharge Instructions for Patients Receiving Chemotherapy  Today you received the following chemotherapy agent: Etoposide.     If you develop nausea and vomiting, or diarrhea that is not controlled by your medication, call the clinic.  The clinic phone number is [phone]. Office hours are Monday-Friday 8:30am-5:00pm.  BELOW ARE SYMPTOMS THAT SHOULD BE REPORTED IMMEDIATELY:  *FEVER GREATER THAN 101.0 F  *CHILLS WITH OR WITHOUT FEVER  NAUSEA AND VOMITING THAT IS NOT CONTROLLED WITH YOUR NAUSEA MEDICATION  *UNUSUAL SHORTNESS OF BREATH  *UNUSUAL BRUISING OR BLEEDING  TENDERNESS IN MOUTH AND THROAT WITH OR WITHOUT PRESENCE OF ULCERS  *URINARY PROBLEMS  *BOWEL PROBLEMS  UNUSUAL RASH Items with * indicate a potential emergency and should be followed up as soon as possible. If you have an emergency after office hours please contact your primary care physician or go to the nearest emergency department.  Please call the clinic during office hours if you have any questions or concerns.   You may also contact the Patient Navigator at [phone] should you have any questions or need assistance in obtaining follow up care.

## 2015-03-30 ENCOUNTER — Encounter (HOSPITAL_BASED_OUTPATIENT_CLINIC_OR_DEPARTMENT_OTHER): Payer: 59

## 2015-03-30 VITALS — BP 125/74 | HR 78 | Temp 98.0°F | Resp 18

## 2015-03-30 DIAGNOSIS — C3411 Malignant neoplasm of upper lobe, right bronchus or lung: Secondary | ICD-10-CM | POA: Diagnosis not present

## 2015-03-30 DIAGNOSIS — Z5111 Encounter for antineoplastic chemotherapy: Secondary | ICD-10-CM | POA: Diagnosis not present

## 2015-03-30 DIAGNOSIS — C3490 Malignant neoplasm of unspecified part of unspecified bronchus or lung: Secondary | ICD-10-CM

## 2015-03-30 DIAGNOSIS — K8689 Other specified diseases of pancreas: Secondary | ICD-10-CM

## 2015-03-30 DIAGNOSIS — K869 Disease of pancreas, unspecified: Secondary | ICD-10-CM | POA: Insufficient documentation

## 2015-03-30 DIAGNOSIS — R918 Other nonspecific abnormal finding of lung field: Secondary | ICD-10-CM | POA: Insufficient documentation

## 2015-03-30 DIAGNOSIS — C3491 Malignant neoplasm of unspecified part of right bronchus or lung: Secondary | ICD-10-CM | POA: Insufficient documentation

## 2015-03-30 DIAGNOSIS — Z0000 Encounter for general adult medical examination without abnormal findings: Secondary | ICD-10-CM | POA: Insufficient documentation

## 2015-03-30 MED ORDER — SODIUM CHLORIDE 0.9 % IV SOLN
Freq: Once | INTRAVENOUS | Status: AC
  Administered 2015-03-30: 10:00:00 via INTRAVENOUS

## 2015-03-30 MED ORDER — PEGFILGRASTIM 6 MG/0.6ML ~~LOC~~ PSKT
6.0000 mg | PREFILLED_SYRINGE | Freq: Once | SUBCUTANEOUS | Status: AC
  Administered 2015-03-30: 6 mg via SUBCUTANEOUS

## 2015-03-30 MED ORDER — PEGFILGRASTIM 6 MG/0.6ML ~~LOC~~ PSKT
PREFILLED_SYRINGE | SUBCUTANEOUS | Status: AC
  Filled 2015-03-30: qty 0.6

## 2015-03-30 MED ORDER — SODIUM CHLORIDE 0.9 % IV SOLN
100.0000 mg/m2 | Freq: Once | INTRAVENOUS | Status: AC
  Administered 2015-03-30: 210 mg via INTRAVENOUS
  Filled 2015-03-30: qty 10.5

## 2015-03-30 MED ORDER — SODIUM CHLORIDE 0.9 % IJ SOLN: 10.0000 mL | INTRAMUSCULAR | Status: DC | PRN

## 2015-03-30 MED ORDER — SODIUM CHLORIDE 0.9 % IV SOLN
Freq: Once | INTRAVENOUS | Status: AC
  Administered 2015-03-30: 10:00:00 via INTRAVENOUS
  Filled 2015-03-30: qty 5

## 2015-03-30 NOTE — Patient Instructions (Signed)
[facility] Discharge Instructions for Patients Receiving Chemotherapy  Today you received the following chemotherapy agent: Etoposide.     If you develop nausea and vomiting, or diarrhea that is not controlled by your medication, call the clinic.  The clinic phone number is [phone]. Office hours are Monday-Friday 8:30am-5:00pm.  BELOW ARE SYMPTOMS THAT SHOULD BE REPORTED IMMEDIATELY:  *FEVER GREATER THAN 101.0 F  *CHILLS WITH OR WITHOUT FEVER  NAUSEA AND VOMITING THAT IS NOT CONTROLLED WITH YOUR NAUSEA MEDICATION  *UNUSUAL SHORTNESS OF BREATH  *UNUSUAL BRUISING OR BLEEDING  TENDERNESS IN MOUTH AND THROAT WITH OR WITHOUT PRESENCE OF ULCERS  *URINARY PROBLEMS  *BOWEL PROBLEMS  UNUSUAL RASH Items with * indicate a potential emergency and should be followed up as soon as possible. If you have an emergency after office hours please contact your primary care physician or go to the nearest emergency department.  Please call the clinic during office hours if you have any questions or concerns.   You may also contact the Patient Navigator at [phone] should you have any questions or need assistance in obtaining follow up care.

## 2015-03-30 NOTE — Progress Notes (Signed)
Patient tolerated infusion well.  VSS.  OnPro applied, patient understands instructions.

## 2015-04-11 ENCOUNTER — Telehealth (HOSPITAL_COMMUNITY): Payer: Self-pay | Admitting: Hematology & Oncology

## 2015-04-11 NOTE — Telephone Encounter (Signed)
PC TO PTS WIFE RE LETTERS FROM [employer] STATING THE PTS INS PREMIUMS WHERE NOT UP TO DATE AND BILL MY NOT GET PAID. PER SHANA LETTERS WHERE SENT OUT IN ERROR AND THEY HAVE THE PREMIUMS COMING OUT ON AUTH DRAFT. THEY WILL ALSO CHANGE TO [employer]  IN 2017

## 2015-04-12 ENCOUNTER — Other Ambulatory Visit (HOSPITAL_COMMUNITY): Payer: Self-pay | Admitting: Oncology

## 2015-04-12 DIAGNOSIS — C3490 Malignant neoplasm of unspecified part of unspecified bronchus or lung: Secondary | ICD-10-CM

## 2015-04-13 ENCOUNTER — Ambulatory Visit (HOSPITAL_COMMUNITY)
Admission: RE | Admit: 2015-04-13 | Discharge: 2015-04-13 | Disposition: A | Discharge status: Home / Self Care | Payer: 59 | Source: Ambulatory Visit | Attending: Oncology | Admitting: Oncology

## 2015-04-13 ENCOUNTER — Telehealth (HOSPITAL_COMMUNITY): Payer: Self-pay

## 2015-04-13 ENCOUNTER — Encounter (HOSPITAL_BASED_OUTPATIENT_CLINIC_OR_DEPARTMENT_OTHER): Payer: 59 | Admitting: Oncology

## 2015-04-13 ENCOUNTER — Encounter (HOSPITAL_COMMUNITY): Payer: Self-pay | Admitting: Oncology

## 2015-04-13 VITALS — BP 142/83 | HR 85 | Temp 98.1°F | Resp 20 | Wt 216.6 lb

## 2015-04-13 DIAGNOSIS — K115 Sialolithiasis: Secondary | ICD-10-CM

## 2015-04-13 DIAGNOSIS — C3490 Malignant neoplasm of unspecified part of unspecified bronchus or lung: Secondary | ICD-10-CM | POA: Diagnosis not present

## 2015-04-13 DIAGNOSIS — R221 Localized swelling, mass and lump, neck: Secondary | ICD-10-CM | POA: Diagnosis not present

## 2015-04-13 DIAGNOSIS — C3491 Malignant neoplasm of unspecified part of right bronchus or lung: Secondary | ICD-10-CM

## 2015-04-13 DIAGNOSIS — I714 Abdominal aortic aneurysm, without rupture: Secondary | ICD-10-CM | POA: Diagnosis not present

## 2015-04-13 DIAGNOSIS — Z79899 Other long term (current) drug therapy: Secondary | ICD-10-CM | POA: Diagnosis not present

## 2015-04-13 MED ORDER — HYDROCODONE-ACETAMINOPHEN 10-325 MG PO TABS: 1.0000 | ORAL_TABLET | Freq: Four times a day (QID) | ORAL | Status: DC | PRN

## 2015-04-13 MED ORDER — IOHEXOL 300 MG/ML  SOLN
100.0000 mL | Freq: Once | INTRAMUSCULAR | Status: AC | PRN
  Administered 2015-04-13: 100 mL via INTRAVENOUS

## 2015-04-13 NOTE — Telephone Encounter (Signed)
-----   Message from Baird Cancer, PA-C sent at 04/13/2015  3:34 PM EST ----- His US demonstrated salivary stones.  3 are noted.  The most distal one is the stone likely causing his symptoms.  There is distal duct dilitation.  Patient is advised of these results.  I have recommended Hydration, massage of area as tolerated, and consuming hard, TART candies throughout the day.

## 2015-04-13 NOTE — Patient Instructions (Signed)
.  [place] at [facility] Discharge Instructions  RECOMMENDATIONS MADE BY THE CONSULTANT AND ANY TEST RESULTS WILL BE SENT TO YOUR REFERRING PHYSICIAN.  U/s of rt head and neck Return as scheduled  Thank you for choosing [place] at [facility] to provide your oncology and hematology care.  To afford each patient quality time with our provider, please arrive at least 15 minutes before your scheduled appointment time.    You need to re-schedule your appointment should you arrive 10 or more minutes late.  We strive to give you quality time with our providers, and arriving late affects you and other patients whose appointments are after yours.  Also, if you no show three or more times for appointments you may be dismissed from the clinic at the providers discretion.     Again, thank you for choosing [facility].  Our hope is that these requests will decrease the amount of time that you wait before being seen by our physicians.       _____________________________________________________________  Should you have questions after your visit to [facility], please contact our office at [phone] between the hours of 8:30 a.m. and 4:30 p.m.  Voicemails left after 4:30 p.m. will not be returned until the following business day.  For prescription refill requests, have your pharmacy contact our office.

## 2015-04-13 NOTE — Progress Notes (Signed)
Patient is seen as a work-in today.  He reports that he had CT imaging today and thought he would come up here due to the below complaint:  He reports that last evening, after eating some stew, he noted right facial and neck tenderness with edema.  He reports this happened in the past and he was diagnosed with abscess.  However, since then "I have had my teeth removed" and therefore the patient assumes that abscess cannot form as a result.  He denies any fevers or chills.  He notes tenderness to palpation.  He does not have a port or CV access.  BP 142/83 mmHg  Pulse 85  Temp(Src) 98.1 F (36.7 C) (Oral)  Resp 20  Wt 216 lb 9.6 oz (98.249 kg)  SpO2 99% Gen: NAD.  Pleasant.  Accompanied by his wife. HEENT: Right neck edema that is tender to palpation with right pre-auricular edema and right maxillary edema.  All is tender to palpation.  No erythema noted. Ear: Right ear without pain on palpation and with negative erythema of TM or auditory canal. Oropharynx: upper dentures in place.  No obvious abnormality. Neuro: No focal deficits noted.  Assessment: 1. Right facial and neck swelling with tenderness 2. 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  Plan: 1. [place] right neck/face to evaluate for abscess.  Given CT imaging demonstrating a response to therapy, likelihood of progressive small cell lung cancer is unlikely.  Additionally, timing of findings over 12 hours is unlikely malignancy related. 2. Refill on his Hydrocodone provided today 3. Return as planned  Patient and plan discussed with Dr. Ancil Linsey and she is in agreement with the aforementioned.   KEFALAS,THOMAS, PA-C 04/13/2015 11:46 AM  Addendum: His US demonstrated salivary stones.  3 are noted.  The most distal one is the stone likely causing his symptoms.  There is distal duct dilitation.  Patient is advised of these results.  I have recommended Hydration, massage of area as tolerated, and consuming hard,  TART candies throughout the day.  Robynn Pane, PA-C 04/13/2015 3:24 PM

## 2015-04-13 NOTE — Telephone Encounter (Signed)
Spoke with wife and instructions given.  She will let Jeneen Rinks know.

## 2015-04-13 NOTE — Telephone Encounter (Signed)
Wife notified and will let Jeneen Rinks.know.

## 2015-04-19 ENCOUNTER — Encounter (HOSPITAL_BASED_OUTPATIENT_CLINIC_OR_DEPARTMENT_OTHER): Payer: 59

## 2015-04-19 ENCOUNTER — Encounter (HOSPITAL_COMMUNITY): Payer: Self-pay | Admitting: Hematology & Oncology

## 2015-04-19 ENCOUNTER — Encounter (HOSPITAL_BASED_OUTPATIENT_CLINIC_OR_DEPARTMENT_OTHER): Payer: 59 | Admitting: Hematology & Oncology

## 2015-04-19 VITALS — BP 128/89 | HR 70 | Temp 97.7°F | Resp 16 | Wt 218.4 lb

## 2015-04-19 DIAGNOSIS — K869 Disease of pancreas, unspecified: Secondary | ICD-10-CM | POA: Diagnosis not present

## 2015-04-19 DIAGNOSIS — Z5111 Encounter for antineoplastic chemotherapy: Secondary | ICD-10-CM

## 2015-04-19 DIAGNOSIS — G893 Neoplasm related pain (acute) (chronic): Secondary | ICD-10-CM

## 2015-04-19 DIAGNOSIS — T451X5A Adverse effect of antineoplastic and immunosuppressive drugs, initial encounter: Secondary | ICD-10-CM

## 2015-04-19 DIAGNOSIS — K8689 Other specified diseases of pancreas: Secondary | ICD-10-CM

## 2015-04-19 DIAGNOSIS — C3491 Malignant neoplasm of unspecified part of right bronchus or lung: Secondary | ICD-10-CM

## 2015-04-19 DIAGNOSIS — E871 Hypo-osmolality and hyponatremia: Secondary | ICD-10-CM

## 2015-04-19 DIAGNOSIS — C3411 Malignant neoplasm of upper lobe, right bronchus or lung: Secondary | ICD-10-CM | POA: Diagnosis not present

## 2015-04-19 DIAGNOSIS — D6481 Anemia due to antineoplastic chemotherapy: Secondary | ICD-10-CM

## 2015-04-19 DIAGNOSIS — Z720 Tobacco use: Secondary | ICD-10-CM | POA: Diagnosis not present

## 2015-04-19 DIAGNOSIS — C3490 Malignant neoplasm of unspecified part of unspecified bronchus or lung: Secondary | ICD-10-CM

## 2015-04-19 DIAGNOSIS — Z0000 Encounter for general adult medical examination without abnormal findings: Secondary | ICD-10-CM | POA: Diagnosis not present

## 2015-04-19 LAB — CBC WITH DIFFERENTIAL/PLATELET
BASOS ABS: 0 10*3/uL (ref 0.0–0.1)
Basophils Relative: 1 %
Eosinophils Absolute: 0.1 10*3/uL (ref 0.0–0.7)
Eosinophils Relative: 2 %
HEMATOCRIT: 30.4 % — AB (ref 39.0–52.0)
HEMOGLOBIN: 10 g/dL — AB (ref 13.0–17.0)
LYMPHS PCT: 20 %
Lymphs Abs: 1.7 10*3/uL (ref 0.7–4.0)
MCH: 29.9 pg (ref 26.0–34.0)
MCHC: 32.9 g/dL (ref 30.0–36.0)
MCV: 90.7 fL (ref 78.0–100.0)
MONO ABS: 0.8 10*3/uL (ref 0.1–1.0)
Monocytes Relative: 9 %
NEUTROS ABS: 5.9 10*3/uL (ref 1.7–7.7)
NEUTROS PCT: 69 %
Platelets: 256 10*3/uL (ref 150–400)
RBC: 3.35 MIL/uL — ABNORMAL LOW (ref 4.22–5.81)
RDW: 21.4 % — AB (ref 11.5–15.5)
WBC: 8.6 10*3/uL (ref 4.0–10.5)

## 2015-04-19 LAB — COMPREHENSIVE METABOLIC PANEL
ALBUMIN: 4.1 g/dL (ref 3.5–5.0)
ALK PHOS: 62 U/L (ref 38–126)
ALT: 15 U/L — AB (ref 17–63)
AST: 16 U/L (ref 15–41)
Anion gap: 8 (ref 5–15)
BILIRUBIN TOTAL: 0.4 mg/dL (ref 0.3–1.2)
BUN: 13 mg/dL (ref 6–20)
CALCIUM: 8.8 mg/dL — AB (ref 8.9–10.3)
CO2: 27 mmol/L (ref 22–32)
CREATININE: 0.95 mg/dL (ref 0.61–1.24)
Chloride: 99 mmol/L — ABNORMAL LOW (ref 101–111)
GFR calc Af Amer: >60 mL/min (ref >60)
GLUCOSE: 107 mg/dL — AB (ref 65–99)
POTASSIUM: 4.3 mmol/L (ref 3.5–5.1)
Sodium: 134 mmol/L — ABNORMAL LOW (ref 135–145)
TOTAL PROTEIN: 7.6 g/dL (ref 6.5–8.1)

## 2015-04-19 MED ORDER — SODIUM CHLORIDE 0.9 % IV SOLN
Freq: Once | INTRAVENOUS | Status: AC
  Administered 2015-04-19: 09:00:00 via INTRAVENOUS

## 2015-04-19 MED ORDER — SODIUM CHLORIDE 0.9 % IV SOLN
713.5000 mg | Freq: Once | INTRAVENOUS | Status: AC
  Administered 2015-04-19: 710 mg via INTRAVENOUS
  Filled 2015-04-19: qty 71

## 2015-04-19 MED ORDER — SODIUM CHLORIDE 0.9 % IV SOLN
100.0000 mg/m2 | Freq: Once | INTRAVENOUS | Status: AC
  Administered 2015-04-19: 210 mg via INTRAVENOUS
  Filled 2015-04-19: qty 10.5

## 2015-04-19 MED ORDER — PALONOSETRON HCL INJECTION 0.25 MG/5ML
0.2500 mg | Freq: Once | INTRAVENOUS | Status: AC
  Administered 2015-04-19: 0.25 mg via INTRAVENOUS
  Filled 2015-04-19: qty 5

## 2015-04-19 MED ORDER — SODIUM CHLORIDE 0.9 % IJ SOLN
10.0000 mL | INTRAMUSCULAR | Status: DC | PRN
  Administered 2015-04-19: 10 mL
  Filled 2015-04-19: qty 10

## 2015-04-19 MED ORDER — SODIUM CHLORIDE 0.9 % IV SOLN
Freq: Once | INTRAVENOUS | Status: AC
  Administered 2015-04-19: 10:00:00 via INTRAVENOUS
  Filled 2015-04-19: qty 5

## 2015-04-19 NOTE — Patient Instructions (Addendum)
[place] at [facility] Discharge Instructions  RECOMMENDATIONS MADE BY THE CONSULTANT AND ANY TEST RESULTS WILL BE SENT TO YOUR REFERRING PHYSICIAN.   Exam completed by Dr Whitney Muse today Scans reviewed. You have had a good response to chemotherapy We need to try to get you through 6 cycles. You are on cycle 4 today. Return tomorrow for day 2, Thursday for day 3,  neulasta OBI on thursday Return to see the doctor in 3 weeks with next chemotherapy cycle Please call the clinic if you have any questions or concerns    Thank you for choosing [place] at [facility] to provide your oncology and hematology care.  To afford each patient quality time with our provider, please arrive at least 15 minutes before your scheduled appointment time.    You need to re-schedule your appointment should you arrive 10 or more minutes late.  We strive to give you quality time with our providers, and arriving late affects you and other patients whose appointments are after yours.  Also, if you no show three or more times for appointments you may be dismissed from the clinic at the providers discretion.     Again, thank you for choosing [facility].  Our hope is that these requests will decrease the amount of time that you wait before being seen by our physicians.       _____________________________________________________________  Should you have questions after your visit to [facility], please contact our office at [phone] between the hours of 8:30 a.m. and 4:30 p.m.  Voicemails left after 4:30 p.m. will not be returned until the following business day.  For prescription refill requests, have your pharmacy contact our office.

## 2015-04-19 NOTE — Progress Notes (Signed)
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    01/18/2015 Imaging CT chest- 8.7 x 8.7 cm R hilar mass encasing the R pulm artery and occluding the R upper lobe bronchus. Sm R pleural effusion; malignant or reactive. 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 34 mm x 26 mm mass in body of pancreas   01/21/2015 Imaging MRI brain- No acute intracranial findings. No signs of metastatic disease. Minor white matter disease, stable and nonspecific.   01/26/2015 Pathology Results Lung, biopsy, right upper lobe - SMALL CELL CARCINOMA.   01/26/2015 Pathology Results Diagnosis FINE NEEDLE ASPIRATION: ENDOSCOPIC SPECIMEN A, EBUS LEVEL 7 NODE (SPECIMEN 1 OF 3 COLLECTED 01/26/2015) MALIGNANT CELLS PRESENT, CONSISTENT WITH SMALL CELL CARCINOMA.   01/26/2015 Pathology Results WANG NEEDLE ASPIRATION, SPECIMEN B LEVEL 7 NODE (SPECIMEN 2 OF 3 COLLECTED 01/26/2015) MALIGNANT CELLS PRESENT, CONSISTENT WITH SMALL CELL CARCINOMA.   01/26/2015  Pathology Results Diagnosis BRONCHIAL BRUSHING SPECIMEN C, RIGHT UPPER LOBE (SPECIMEN 3 OF 3 COLLECTED 01/26/2015) ATYPICAL CELLS PRESENT.   01/27/2015 PET scan Large hypermetabolic right hilar mass with mediastinal invasion and infrahilar extension, maximum standard uptake value 13.0.  Pancreatic tail mass, approximately 4.5 cm in diameter. Abdominal aortic aneurysm, 5.8 cm in diameter.   02/10/2015 Pathology Results Pancreatic biopsy via EUS:    02/14/2015 - 03/06/2015 Chemotherapy Cisplatin/Etoposide              HISTORY OF PRESENTING ILLNESS:  [name] Sayed89.o. male is here for further up of extensive stage SCLC.   [name] here to receive chemotherapy cycle #4 and to discuss recent imaging results today. He has no complaints and has been doing well with current treatment. His family has been pressuring him to seek a second opinion however [facility] that a referral could be made for a second opinion if he is interested.   Denies fever and bowel issues. He has a cough but attributes it to catching "whatever his son had". He has decreased his smoking to 1/2 ppd from previous 3 ppd. He has received a flu shot this year. He was curious when he would begin radiation treatment. He was also curious about his aneurysm. We discussed that he has a scan scheduled for 05/16/15 for further follow-up.  MEDICAL HISTORY:  Past Medical History  Diagnosis Date  . Coronary artery disease 2002    Lesion in LAD s/p angioplasty; s/p CABG x 4 Sept 2013  . Tobacco abuse   .  Myocardial infarct [facility]) [id number]    stents  . Hyperlipidemia 1999  . Essential hypertension, benign 1999  . Lung mass   . COPD (chronic obstructive pulmonary disease) ([place])   . Shortness of breath dyspnea     with exertion, sleeps with more pillows under head  . Arthritis   . Pancreatitis   . Complication of anesthesia     woke up while being extubated and remembers  fighting  . Cancer ([place])     lung  . Mass of pancreas     SURGICAL HISTORY: Past Surgical History  Procedure Laterality Date  . Knee surgery      left x 2  ACL repair  . Tee without cardioversion  01/02/2012    Procedure: TRANSESOPHAGEAL ECHOCARDIOGRAM (TEE);  Surgeon: [name], MD;  Location: [place];  Service: Open Heart Surgery;  Laterality: N/A;  . Multiple extractions with alveoloplasty  02/21/2012    Procedure: MULTIPLE EXTRACION WITH ALVEOLOPLASTY;  Surgeon: Lenn Cal, DDS;  Location: [facility];  Service: Oral Surgery;  Laterality: N/A;  Extaction of tooth #'s 4,5,6,7,10,11,12,13,14,15,19,20,21,22,23,24,25,   . Left heart catheterization with coronary angiogram N/A 01/01/2012    Procedure: LEFT HEART CATHETERIZATION WITH CORONARY ANGIOGRAM;  Surgeon: Hillary Bow, MD;  Location: [facility] CATH LAB;  Service: Cardiovascular;  Laterality: N/A;  . Coronary artery bypass graft  01/02/2012    Procedure: CORONARY ARTERY BYPASS GRAFTING (CABG);  Surgeon: [name], MD;  Location: [place];  Service: Open Heart Surgery;  Laterality: N/A;  times three using left internal mammary artery and right endoscopically harvested saphenous vein  . Coronary angioplasty  1999, 2002  . Video bronchoscopy with endobronchial ultrasound N/A 01/26/2015    Procedure: VIDEO BRONCHOSCOPY WITH ENDOBRONCHIAL ULTRASOUND;  Surgeon: [name], MD;  Location: [place];  Service: Thoracic;  Laterality: N/A;  . Lung biopsy N/A 01/26/2015    Procedure: LUNG BIOPSY;  Surgeon: [name], MD;  Location: [place];  Service: Thoracic;  Laterality: N/A;  . Lymph node biopsy N/A 01/26/2015    Procedure: LYMPH NODE BIOPSY;  Surgeon: [name], MD;  Location: [place];  Service: Thoracic;  Laterality: N/A;  . Portacath placement Left 02/01/2015    Procedure: ATTEMPTED INSERTION OF PORT-A-CATH;  Surgeon: [name], MD;  Location: [place];  Service: Thoracic;  Laterality: Left;  . Eus N/A 02/10/2015      Procedure: UPPER ENDOSCOPIC ULTRASOUND (EUS) LINEAR;  Surgeon: [name], MD;  Location: [facility];  Service: Endoscopy;  Laterality: N/A;    SOCIAL HISTORY: Social History   Social History  . Marital Status: Married    Spouse Name: N/A  . Number of Children: 5  . Years of Education: N/A   Occupational History  . Works in [place]  . Smoking status: Current Every Day Smoker -- 1.00 packs/day for 35 years    Types: Cigarettes    Start date: 06/30/1976  . Smokeless tobacco: Never Used  . Alcohol Use: Yes     Comment: maybe a 12 pack/mth if that - very rare since 2013  . Drug Use: No  . Sexual Activity: Yes   Other Topics Concern  . Not on file   Social History Narrative   Darral Dash been married 17 years, with five kids.  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 Cancer Neg Hx   . Heart disease Father   . Diabetes Father   . Hyperlipidemia Father   . Hypertension Father   . Hyperlipidemia Mother   . Hypertension Mother   . Stroke Mother   . Diabetes Paternal Grandmother    indicated that his mother is alive. He indicated that his father is alive. He indicated that his sister is alive. He indicated that his brother is alive. He indicated that his maternal grandmother is deceased. He indicated that his maternal grandfather is deceased. He indicated that his paternal grandmother is deceased. He indicated that his paternal grandfather is deceased.   His father's somewhere in [place]; his mother lives here in [place] and works at [employer] 76 at the end of this month She's got  high blood pressure, high cholesterol, no strokes that they know of; is on blood thinners Has an aneurism at the base of the aorta where it goes into your stomach He knows his father's had high blood pressure and diabetes; Parents have been divorced since he was 3-4 Has half brothers / half sisters   ALLERGIES:  is allergic to lisinopril and tea.  MEDICATIONS:  Current Outpatient Prescriptions  Medication Sig Dispense Refill  . amLODipine (NORVASC) 5 MG tablet TAKE ONE (1) TABLET BY MOUTH EVERY DAY (Patient taking differently: TAKE ONE (1) TABLET BY MOUTH AT BEDTIME) 90 tablet 6  . aspirin EC 81 MG tablet Take 81 mg by mouth daily.    . carvedilol (COREG) 6.25 MG tablet TAKE ONE TABLET BY MOUTH TWICE A DAY WITH A MEAL 180 tablet 3  . fentaNYL (DURAGESIC - DOSED MCG/HR) 50 MCG/HR Place 1 patch (50 mcg total) onto the skin every 3 (three) days. (Patient not taking: Reported on 03/28/2015) 10 patch 0  . HYDROcodone-acetaminophen (NORCO) 10-325 MG tablet Take 1 tablet by mouth every 6 (six) hours as needed. 90 tablet 0  . ibuprofen (ADVIL,MOTRIN) 200 MG tablet Take 600 mg by mouth every 6 (six) hours as needed for moderate pain. Reported on 04/13/2015    . ondansetron (ZOFRAN) 8 MG tablet Take 1 tablet (8 mg total) by mouth every 8 (eight) hours as needed for nausea or vomiting. (Patient not taking: Reported on 04/13/2015) 30 tablet 2  . pravastatin (PRAVACHOL) 40 MG tablet TAKE ONE (1) TABLET BY MOUTH EVERY DAY (Patient taking differently: TAKE ONE (1) TABLET BY MOUTH AT BEDTIME) 90 tablet 6  . prochlorperazine (COMPAZINE) 10 MG tablet Take 1 tablet (10 mg total) by mouth every 6 (six) hours as needed for nausea or vomiting. (Patient not taking: Reported on 03/28/2015) 30 tablet 2   No current facility-administered medications for this visit.    Review of Systems  Constitutional: Negative for fever, chills, weight loss and malaise/fatigue.  HENT: Negative for congestion, hearing loss,  nosebleeds, sore throat and tinnitus.   Eyes: Negative for blurred vision, double vision, pain and discharge.  Respiratory: Negative for hemoptysis, cough, shortness of breath and wheezing.   Cardiovascular: Negative for palpitations, claudication, leg swelling and PND.  Gastrointestinal: Negative for heartburn, nausea, vomiting, abdominal pain, diarrhea, constipation, blood in stool and melena.  Genitourinary: Negative for dysuria, urgency, frequency and hematuria.  Musculoskeletal: Positive for joint pain. Negative for myalgias and falls.       Has had knee surgeries.  Skin: Negative for itching and rash.  Neurological: Negative for dizziness, tingling, tremors, sensory change, speech change, focal weakness, seizures, loss of consciousness, weakness and headaches.  Endo/Heme/Allergies: Does not bruise/bleed easily.  Psychiatric/Behavioral: Negative for depression, suicidal ideas, memory loss and substance abuse. The patient is not nervous/anxious and does not have insomnia.   All other systems reviewed and are negative.  14 point ROS was done and is otherwise as detailed above or in HPI   PHYSICAL EXAMINATION: ECOG PERFORMANCE STATUS: 0 - Asymptomatic  Vitals with BMI 04/19/2015  Height   Weight 218 lbs 6 oz  BMI   Systolic 812  Diastolic 79  Pulse 79  Respirations 16   Physical Exam  Constitutional: He is oriented to person, place, and time and well-developed, well-nourished, and in no distress. Alopecia  HENT:  Head: Normocephalic and atraumatic.  Nose: Nose normal.  Mouth/Throat: Oropharynx is clear and moist. No oropharyngeal exudate.  Dentures on top and bottom.  Eyes: Conjunctivae and EOM are normal. Pupils are equal, round, and reactive to light. Right eye exhibits no discharge. Left eye exhibits no discharge. No scleral icterus.  Neck: Normal range of motion. Neck supple. No tracheal deviation present. No thyromegaly present.  Cardiovascular: Normal rate, regular  rhythm and normal heart sounds.  Exam reveals no gallop and no friction rub.   No murmur heard. Pulmonary/Chest: Effort normal. He has no wheezes. He has no rales.  Abdominal: Soft. Bowel sounds are normal. He exhibits no distension and no mass. There is no tenderness. There is no rebound and no guarding.  Musculoskeletal: Normal range of motion. He exhibits no edema.  Lymphadenopathy:    He has no cervical adenopathy.  Neurological: He is alert and oriented to person, place, and time. He has normal reflexes. No cranial nerve deficit. Gait normal. Coordination normal.  Skin: Skin is warm and dry. No rash noted.  Psychiatric: Mood, memory, affect and judgment normal.  Nursing note and vitals reviewed.   LABORATORY DATA:  I have reviewed the data as listed.  CBC    Component Value Date/Time   WBC 8.6 04/19/2015 0900   RBC 3.35* 04/19/2015 0900   HGB 10.0* 04/19/2015 0900   HCT 30.4* 04/19/2015 0900   PLT 256 04/19/2015 0900   MCV 90.7 04/19/2015 0900   MCH 29.9 04/19/2015 0900   MCHC 32.9 04/19/2015 0900   RDW 21.4* 04/19/2015 0900   LYMPHSABS 1.7 04/19/2015 0900   MONOABS 0.8 04/19/2015 0900   EOSABS 0.1 04/19/2015 0900   BASOSABS 0.0 04/19/2015 0900   CMP     Component Value Date/Time   NA 134* 04/19/2015 0900   K 4.3 04/19/2015 0900   CL 99* 04/19/2015 0900   CO2 27 04/19/2015 0900   GLUCOSE 107* 04/19/2015 0900   BUN 13 04/19/2015 0900   CREATININE 0.95 04/19/2015 0900   CREATININE 0.99 01/27/2015 1450   CALCIUM 8.8* 04/19/2015 0900   PROT 7.6 04/19/2015 0900   ALBUMIN 4.1 04/19/2015 0900   AST 16 04/19/2015 0900   ALT 15* 04/19/2015 0900   ALKPHOS 62 04/19/2015 0900   BILITOT 0.4 04/19/2015 0900   GFRNONAA >60 04/19/2015 0900   GFRNONAA 87 01/27/2015 1450   GFRAA >60 04/19/2015 0900   GFRAA >89 01/27/2015 1450    RADIOGRAPHIC STUDIES: I have personally reviewed the radiological images as listed and agreed with the findings  in the report.  Study Result      CLINICAL DATA: Initial treatment strategy for right upper lobe small cell carcinoma.  EXAM: NUCLEAR MEDICINE PET SKULL BASE TO THIGH  TECHNIQUE: 10.9 mCi F-18 FDG was injected intravenously. Full-ring PET imaging was performed from the skull base to thigh after the radiotracer. CT data was obtained and used for attenuation correction and anatomic localization.  FASTING BLOOD GLUCOSE: Value: 98 mg/dl  COMPARISON: 01/18/2015  FINDINGS: NECK  Bilateral tonsillar pillar activity appears symmetric and probably physiologic.  CHEST  Right hilar mass maximum standard uptake value 13.0. Similar appearance to prior with regard to occlusion of the right upper lobe tracheobronchial tree and infrahilar and mediastinal extension. Peribronchovascular nodularity in the right upper lobe demonstrates low-level activity, maximum standard uptake value approximately 2.6, possibly from malignancy or postobstructive pneumonitis. Underlying emphysema. Peripherally increased signal along the increasing region of consolidation in the peripheral right upper lobe on image 77 series 4, maximum standard uptake value 3.8.  ABDOMEN/PELVIS  Likely physiologic activity along the cardia of the stomach. Previously described mass in the pancreatic tail has a maximum standard uptake value 12.6. This appears separate from the left adrenal gland. No hypermetabolic liver lesions.  Infrarenal abdominal aortic aneurysm measuring 5.8 cm in diameter on image 143 series 4, fusiform and with peripheral atherosclerosis. High activity along the right T12 transverse process as along the articulation with the rib and probably degenerative.  SKELETON  No focal hypermetabolic activity to suggest skeletal metastasis.  IMPRESSION: 1. Large hypermetabolic right hilar mass with mediastinal invasion and infrahilar extension, maximum standard uptake value 13.0. 2. Patchy opacities with increased  activity in the apical segment right upper lobe and especially peripherally along the posterior inferior margin of the right upper lobe, potentially from tumor extension or postobstructive pneumonitis. 3. Pancreatic tail mass, approximately 4.5 cm in diameter, could be a metastatic lesion or a primary pancreatic adenocarcinoma. No liver metastatic lesion identified. 4. Abdominal aortic aneurysm, 5.8 cm in diameter. Vascular surgery consultation recommended due to increased risk of rupture for AAA >5.5 cm. This recommendation follows ACR consensus guidelines: White Paper of the ACR Incidental Findings Committee II on Vascular Findings. J Am Coll Radiol 2013; 10:789-794.   Electronically Signed  By: Van Clines M.D.  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 Large hypermetabolic right hilar mass with mediastinal invasion and infrahilar extension  Hyponatremia c/w SIADH  EUS with Dr. JaArdis Hughsnd pancreatic biopsy on 02/10/2015 c/w SCLC Anemia secondary to chemotherapy CT C/A/P 04/13/2015 with interval response to therapy.  He will continue with cycle #4 of carboplatin/etoposide.  He is doing very well with therapy. Cough is overall markedly improved. He still works as able.  He has cut back on smoking but still smokes 1/2 pack daily.  I have encouraged him to continue to try to quit. Goal is to complete 6 cycles of therapy.  We will refer him to XRT for  consideration  of whole brain XRT at the conclusion of chemotherapy.  He says he's good in terms of refills.  Hyponatremia has resolved c/w response to therapy.  He has developed anemia, secondary to chemotherapy, and we will monitor moving forward. He is asymptomatic.   [name] has CT Angio scheduled for 05/16/15 for further follow-up of his aneurysm.  The patient will continue treatment and I will see him in 3 weeks when he receives cycle #5.  I am glad to send him for a second opinion if he desires and I discussed this with him today.  I continue to address the incurable nature of extensive stage SCLC.   All questions were answered. The patient knows to call the clinic with any problems, questions or concerns.  This document serves as a record of services personally performed by Ancil Linsey, MD. It was created on her behalf by Arlyce Harman, a trained medical scribe. The creation of this record is based on the scribe's personal observations and the provider's statements to them. This document has been checked and approved by the attending provider.  I have reviewed the above documentation for accuracy and completeness, and I agree with the above.  This note was electronically signed.    Kelby Fam. Penland, MD   04/19/2015 9:42 AM

## 2015-04-19 NOTE — Patient Instructions (Signed)
[facility] Discharge Instructions for Patients Receiving Chemotherapy  Today you received the following chemotherapy agents:  Carboplatin and etoposide  If you develop nausea and vomiting, or diarrhea that is not controlled by your medication, call the clinic.  The clinic phone number is [phone]. Office hours are Monday-Friday 8:30am-5:00pm.  BELOW ARE SYMPTOMS THAT SHOULD BE REPORTED IMMEDIATELY:  *FEVER GREATER THAN 101.0 F  *CHILLS WITH OR WITHOUT FEVER  NAUSEA AND VOMITING THAT IS NOT CONTROLLED WITH YOUR NAUSEA MEDICATION  *UNUSUAL SHORTNESS OF BREATH  *UNUSUAL BRUISING OR BLEEDING  TENDERNESS IN MOUTH AND THROAT WITH OR WITHOUT PRESENCE OF ULCERS  *URINARY PROBLEMS  *BOWEL PROBLEMS  UNUSUAL RASH Items with * indicate a potential emergency and should be followed up as soon as possible. If you have an emergency after office hours please contact your primary care physician or go to the nearest emergency department.  Please call the clinic during office hours if you have any questions or concerns.   You may also contact the Patient Navigator at [phone] should you have any questions or need assistance in obtaining follow up care.

## 2015-04-19 NOTE — Progress Notes (Signed)
1245:  Tolerated tx w/o adverse reaction.  VSS.  A&Ox4, in no distress.

## 2015-04-20 ENCOUNTER — Encounter (HOSPITAL_BASED_OUTPATIENT_CLINIC_OR_DEPARTMENT_OTHER): Payer: 59

## 2015-04-20 VITALS — BP 150/87 | HR 82 | Temp 97.6°F | Resp 18

## 2015-04-20 DIAGNOSIS — Z5111 Encounter for antineoplastic chemotherapy: Secondary | ICD-10-CM

## 2015-04-20 DIAGNOSIS — C3411 Malignant neoplasm of upper lobe, right bronchus or lung: Secondary | ICD-10-CM

## 2015-04-20 DIAGNOSIS — K8689 Other specified diseases of pancreas: Secondary | ICD-10-CM

## 2015-04-20 DIAGNOSIS — C3490 Malignant neoplasm of unspecified part of unspecified bronchus or lung: Secondary | ICD-10-CM

## 2015-04-20 DIAGNOSIS — Z736 Limitation of activities due to disability: Secondary | ICD-10-CM | POA: Diagnosis not present

## 2015-04-20 MED ORDER — SODIUM CHLORIDE 0.9 % IV SOLN
100.0000 mg/m2 | Freq: Once | INTRAVENOUS | Status: AC
  Administered 2015-04-20: 210 mg via INTRAVENOUS
  Filled 2015-04-20: qty 10.5

## 2015-04-20 MED ORDER — SODIUM CHLORIDE 0.9 % IJ SOLN
10.0000 mL | INTRAMUSCULAR | Status: DC | PRN
  Administered 2015-04-20: 10 mL
  Filled 2015-04-20: qty 10

## 2015-04-20 MED ORDER — SODIUM CHLORIDE 0.9 % IV SOLN
Freq: Once | INTRAVENOUS | Status: AC
  Administered 2015-04-20: 09:00:00 via INTRAVENOUS

## 2015-04-20 MED ORDER — SODIUM CHLORIDE 0.9 % IV SOLN
Freq: Once | INTRAVENOUS | Status: AC
  Administered 2015-04-20: 09:00:00 via INTRAVENOUS
  Filled 2015-04-20: qty 5

## 2015-04-20 NOTE — Patient Instructions (Signed)
[facility] Discharge Instructions for Patients Receiving Chemotherapy  Today you received the following chemotherapy agents:  Etoposide Return tomorrow as scheduled for Day 3 of treatment.  If you develop nausea and vomiting, or diarrhea that is not controlled by your medication, call the clinic.  The clinic phone number is [phone]. Office hours are Monday-Friday 8:30am-5:00pm.  BELOW ARE SYMPTOMS THAT SHOULD BE REPORTED IMMEDIATELY:  *FEVER GREATER THAN 101.0 F  *CHILLS WITH OR WITHOUT FEVER  NAUSEA AND VOMITING THAT IS NOT CONTROLLED WITH YOUR NAUSEA MEDICATION  *UNUSUAL SHORTNESS OF BREATH  *UNUSUAL BRUISING OR BLEEDING  TENDERNESS IN MOUTH AND THROAT WITH OR WITHOUT PRESENCE OF ULCERS  *URINARY PROBLEMS  *BOWEL PROBLEMS  UNUSUAL RASH Items with * indicate a potential emergency and should be followed up as soon as possible. If you have an emergency after office hours please contact your primary care physician or go to the nearest emergency department.  Please call the clinic during office hours if you have any questions or concerns.   You may also contact the Patient Navigator at [phone] should you have any questions or need assistance in obtaining follow up care.

## 2015-04-20 NOTE — Progress Notes (Signed)
Tolerated tx w/o adverse reaction.  VSS.  A&Ox4.  Discharged ambulatory.

## 2015-04-21 ENCOUNTER — Encounter (HOSPITAL_BASED_OUTPATIENT_CLINIC_OR_DEPARTMENT_OTHER): Payer: 59

## 2015-04-21 VITALS — BP 132/85 | HR 78 | Temp 97.8°F | Resp 16

## 2015-04-21 DIAGNOSIS — Z5111 Encounter for antineoplastic chemotherapy: Secondary | ICD-10-CM | POA: Diagnosis not present

## 2015-04-21 DIAGNOSIS — C3490 Malignant neoplasm of unspecified part of unspecified bronchus or lung: Secondary | ICD-10-CM | POA: Diagnosis not present

## 2015-04-21 DIAGNOSIS — K8689 Other specified diseases of pancreas: Secondary | ICD-10-CM

## 2015-04-21 MED ORDER — SODIUM CHLORIDE 0.9 % IV SOLN
Freq: Once | INTRAVENOUS | Status: AC
  Administered 2015-04-21: 09:00:00 via INTRAVENOUS
  Filled 2015-04-21: qty 5

## 2015-04-21 MED ORDER — SODIUM CHLORIDE 0.9 % IV SOLN
Freq: Once | INTRAVENOUS | Status: AC
  Administered 2015-04-21: 09:00:00 via INTRAVENOUS

## 2015-04-21 MED ORDER — SODIUM CHLORIDE 0.9 % IJ SOLN
10.0000 mL | INTRAMUSCULAR | Status: DC | PRN
  Administered 2015-04-21: 10 mL
  Filled 2015-04-21: qty 10

## 2015-04-21 MED ORDER — PEGFILGRASTIM 6 MG/0.6ML ~~LOC~~ PSKT
PREFILLED_SYRINGE | SUBCUTANEOUS | Status: AC
  Filled 2015-04-21: qty 0.6

## 2015-04-21 MED ORDER — SODIUM CHLORIDE 0.9 % IV SOLN
100.0000 mg/m2 | Freq: Once | INTRAVENOUS | Status: AC
  Administered 2015-04-21: 210 mg via INTRAVENOUS
  Filled 2015-04-21: qty 10.5

## 2015-04-21 MED ORDER — PEGFILGRASTIM 6 MG/0.6ML ~~LOC~~ PSKT
6.0000 mg | PREFILLED_SYRINGE | Freq: Once | SUBCUTANEOUS | Status: AC
  Administered 2015-04-21: 6 mg via SUBCUTANEOUS

## 2015-04-21 MED ORDER — HEPARIN SOD (PORK) LOCK FLUSH 100 UNIT/ML IV SOLN: 500.0000 [IU] | Freq: Once | INTRAVENOUS | Status: DC | PRN

## 2015-04-21 NOTE — Patient Instructions (Signed)
[facility] Discharge Instructions for Patients Receiving Chemotherapy  Today you received the following chemotherapy agents:  Etoposide  If you develop nausea and vomiting, or diarrhea that is not controlled by your medication, call the clinic.  The clinic phone number is [phone]. Office hours are Monday-Friday 8:30am-5:00pm.  BELOW ARE SYMPTOMS THAT SHOULD BE REPORTED IMMEDIATELY:  *FEVER GREATER THAN 101.0 F  *CHILLS WITH OR WITHOUT FEVER  NAUSEA AND VOMITING THAT IS NOT CONTROLLED WITH YOUR NAUSEA MEDICATION  *UNUSUAL SHORTNESS OF BREATH  *UNUSUAL BRUISING OR BLEEDING  TENDERNESS IN MOUTH AND THROAT WITH OR WITHOUT PRESENCE OF ULCERS  *URINARY PROBLEMS  *BOWEL PROBLEMS  UNUSUAL RASH Items with * indicate a potential emergency and should be followed up as soon as possible. If you have an emergency after office hours please contact your primary care physician or go to the nearest emergency department.  Please call the clinic during office hours if you have any questions or concerns.   You may also contact the Patient Navigator at [phone] should you have any questions or need assistance in obtaining follow up care.

## 2015-04-21 NOTE — Progress Notes (Signed)
1040:  Tolerated tx w/o adverse reaction.  A&Ox4, in no distress.  VSS.  Neulasta OBI placement per MD orders.  OBI device filled per protocol and placed on right upper arm. Needle/catheter placement noted prior to patient leaving. Tolerated without incident and aware of injection to be delivered in 27 hours.

## 2015-05-01 ENCOUNTER — Emergency Department (HOSPITAL_COMMUNITY)
Admission: EM | Admit: 2015-05-01 | Discharge: 2015-05-01 | Disposition: A | Discharge status: Home / Self Care | Payer: BLUE CROSS/BLUE SHIELD | Attending: Emergency Medicine | Admitting: Emergency Medicine

## 2015-05-01 ENCOUNTER — Encounter (HOSPITAL_COMMUNITY): Payer: Self-pay | Admitting: Emergency Medicine

## 2015-05-01 DIAGNOSIS — M7989 Other specified soft tissue disorders: Secondary | ICD-10-CM | POA: Insufficient documentation

## 2015-05-01 DIAGNOSIS — C3490 Malignant neoplasm of unspecified part of unspecified bronchus or lung: Secondary | ICD-10-CM | POA: Diagnosis not present

## 2015-05-01 DIAGNOSIS — Z9861 Coronary angioplasty status: Secondary | ICD-10-CM | POA: Insufficient documentation

## 2015-05-01 DIAGNOSIS — Z79899 Other long term (current) drug therapy: Secondary | ICD-10-CM | POA: Insufficient documentation

## 2015-05-01 DIAGNOSIS — I2510 Atherosclerotic heart disease of native coronary artery without angina pectoris: Secondary | ICD-10-CM | POA: Diagnosis not present

## 2015-05-01 DIAGNOSIS — I10 Essential (primary) hypertension: Secondary | ICD-10-CM | POA: Diagnosis not present

## 2015-05-01 DIAGNOSIS — Z7982 Long term (current) use of aspirin: Secondary | ICD-10-CM | POA: Diagnosis not present

## 2015-05-01 DIAGNOSIS — I252 Old myocardial infarction: Secondary | ICD-10-CM | POA: Diagnosis not present

## 2015-05-01 DIAGNOSIS — Z951 Presence of aortocoronary bypass graft: Secondary | ICD-10-CM | POA: Insufficient documentation

## 2015-05-01 DIAGNOSIS — J449 Chronic obstructive pulmonary disease, unspecified: Secondary | ICD-10-CM | POA: Diagnosis not present

## 2015-05-01 DIAGNOSIS — R21 Rash and other nonspecific skin eruption: Secondary | ICD-10-CM | POA: Diagnosis not present

## 2015-05-01 DIAGNOSIS — M1990 Unspecified osteoarthritis, unspecified site: Secondary | ICD-10-CM | POA: Insufficient documentation

## 2015-05-01 DIAGNOSIS — F17210 Nicotine dependence, cigarettes, uncomplicated: Secondary | ICD-10-CM | POA: Diagnosis not present

## 2015-05-01 DIAGNOSIS — Z8719 Personal history of other diseases of the digestive system: Secondary | ICD-10-CM | POA: Diagnosis not present

## 2015-05-01 DIAGNOSIS — T451X5A Adverse effect of antineoplastic and immunosuppressive drugs, initial encounter: Secondary | ICD-10-CM | POA: Insufficient documentation

## 2015-05-01 DIAGNOSIS — T50905A Adverse effect of unspecified drugs, medicaments and biological substances, initial encounter: Secondary | ICD-10-CM

## 2015-05-01 DIAGNOSIS — E785 Hyperlipidemia, unspecified: Secondary | ICD-10-CM | POA: Diagnosis not present

## 2015-05-01 LAB — CBC WITH DIFFERENTIAL/PLATELET
Basophils Absolute: 0 10*3/uL (ref 0.0–0.1)
Basophils Relative: 0 %
EOS ABS: 0.1 10*3/uL (ref 0.0–0.7)
EOS PCT: 1 %
HCT: 25.1 % — ABNORMAL LOW (ref 39.0–52.0)
Hemoglobin: 8.3 g/dL — ABNORMAL LOW (ref 13.0–17.0)
LYMPHS ABS: 1.9 10*3/uL (ref 0.7–4.0)
Lymphocytes Relative: 23 %
MCH: 30.3 pg (ref 26.0–34.0)
MCHC: 33.1 g/dL (ref 30.0–36.0)
MCV: 91.6 fL (ref 78.0–100.0)
MONO ABS: 0.8 10*3/uL (ref 0.1–1.0)
MONOS PCT: 9 %
Neutro Abs: 5.5 10*3/uL (ref 1.7–7.7)
Neutrophils Relative %: 66 %
Platelets: 86 10*3/uL — ABNORMAL LOW (ref 150–400)
RBC: 2.74 MIL/uL — ABNORMAL LOW (ref 4.22–5.81)
RDW: 20.8 % — AB (ref 11.5–15.5)
WBC: 8.2 10*3/uL (ref 4.0–10.5)

## 2015-05-01 LAB — COMPREHENSIVE METABOLIC PANEL
ALT: 14 U/L — AB (ref 17–63)
AST: 14 U/L — AB (ref 15–41)
Albumin: 4 g/dL (ref 3.5–5.0)
Alkaline Phosphatase: 88 U/L (ref 38–126)
Anion gap: 10 (ref 5–15)
BUN: 14 mg/dL (ref 6–20)
CHLORIDE: 99 mmol/L — AB (ref 101–111)
CO2: 28 mmol/L (ref 22–32)
CREATININE: 0.95 mg/dL (ref 0.61–1.24)
Calcium: 9.1 mg/dL (ref 8.9–10.3)
GFR calc Af Amer: >60 mL/min (ref >60)
Glucose, Bld: 89 mg/dL (ref 65–99)
Potassium: 3.8 mmol/L (ref 3.5–5.1)
Sodium: 137 mmol/L (ref 135–145)
Total Bilirubin: 0.3 mg/dL (ref 0.3–1.2)
Total Protein: 7.4 g/dL (ref 6.5–8.1)

## 2015-05-01 MED ORDER — DEXAMETHASONE 4 MG PO TABS
10.0000 mg | ORAL_TABLET | Freq: Once | ORAL | Status: AC
  Administered 2015-05-01: 10 mg via ORAL
  Filled 2015-05-01: qty 3

## 2015-05-01 NOTE — ED Notes (Signed)
MD at bedside. 

## 2015-05-01 NOTE — Discharge Instructions (Signed)
Take Benadryl as needed for the swelling.  Allergies An allergy is an abnormal reaction to a substance by the body's defense system (immune system). Allergies can develop at any age. WHAT CAUSES ALLERGIES? An allergic reaction happens when the immune system mistakenly reacts to a normally harmless substance, called an allergen, as if it were harmful. The immune system releases antibodies to fight the substance. Antibodies eventually release a chemical called histamine into the bloodstream. The release of histamine is meant to protect the body from infection, but it also causes discomfort. An allergic reaction can be triggered by:  Eating an allergen.  Inhaling an allergen.  Touching an allergen. WHAT TYPES OF ALLERGIES ARE THERE? There are many types of allergies. Common types include:  Seasonal allergies. People with this type of allergy are usually allergic to substances that are only present during certain seasons, such as molds and pollens.  Food allergies.  Drug allergies.  Insect allergies.  Animal dander allergies. WHAT ARE SYMPTOMS OF ALLERGIES? Possible allergy symptoms include:  Swelling of the lips, face, tongue, mouth, or throat.  Sneezing, coughing, or wheezing.  Nasal congestion.  Tingling in the mouth.  Rash.  Itching.  Itchy, red, swollen areas of skin (hives).  Watery eyes.  Vomiting.  Diarrhea.  Dizziness.  Lightheadedness.  Fainting.  Trouble breathing or swallowing.  Chest tightness.  Rapid heartbeat. HOW ARE ALLERGIES DIAGNOSED? Allergies are diagnosed with a medical and family history and one or more of the following:  Skin tests.  Blood tests.  A food diary. A food diary is a record of all the foods and drinks you have in a day and of all the symptoms you experience.  The results of an elimination diet. An elimination diet involves eliminating foods from your diet and then adding them back in one by one to find out if a  certain food causes an allergic reaction. HOW ARE ALLERGIES TREATED? There is no cure for allergies, but allergic reactions can be treated with medicine. Severe reactions usually need to be treated at a hospital. HOW CAN REACTIONS BE PREVENTED? The best way to prevent an allergic reaction is by avoiding the substance you are allergic to. Allergy shots and medicines can also help prevent reactions in some cases. People with severe allergic reactions may be able to prevent a life-threatening reaction called anaphylaxis with a medicine given right after exposure to the allergen.   This information is not intended to replace advice given to you by your health care provider. Make sure you discuss any questions you have with your health care provider.   Document Released: 07/03/2002 Document Revised: 04/30/2014 Document Reviewed: 01/19/2014 Elsevier Interactive Patient Education [employer].

## 2015-05-01 NOTE — ED Notes (Signed)
Redness and swelling to left hand and right hand two digits with redness and swelling noted since yesterday evening. PT states last chemo tx was peripheral IV on right arm 04/21/15.

## 2015-05-01 NOTE — ED Notes (Signed)
Patient given discharge instruction, verbalized understand. Patient ambulatory out of the department.  

## 2015-05-01 NOTE — ED Provider Notes (Signed)
CSN: [id number]     Arrival date & time 05/01/15  1553 History   First MD Initiated Contact with Patient 05/01/15 1749     Chief Complaint  Patient presents with  . Joint Swelling      HPI Patient developed a mildly painful rash on his left hand yesterday. Spread from the dorsal aspect of his proximal index finger medially to his ring finger. It is now having the hand and forearm. Mild pain with it. States he removed his wetting. He now also has it on the proximal phalanx of his right second and third finger. He is on chemotherapy for lung cancer. Around 9 days ago he had his chemotherapy and then the next day had his Neulasta. He has had this regimen in the past without problems. No chest pain or trouble breathing. No fevers or chills. His chemotherapy was infused in his right upper arm and somewhat in his left upper arm.   Past Medical History  Diagnosis Date  . Coronary artery disease 2002    Lesion in LAD s/p angioplasty; s/p CABG x 4 Sept 2013  . Tobacco abuse   . Myocardial infarct [facility]) [id number]    stents  . Hyperlipidemia 1999  . Essential hypertension, benign 1999  . Lung mass   . COPD (chronic obstructive pulmonary disease) ([place])   . Shortness of breath dyspnea     with exertion, sleeps with more pillows under head  . Arthritis   . Pancreatitis   . Complication of anesthesia     woke up while being extubated and remembers fighting  . Cancer ([place])     lung  . Mass of pancreas    Past Surgical History  Procedure Laterality Date  . Knee surgery      left x 2  ACL repair  . Tee without cardioversion  01/02/2012    Procedure: TRANSESOPHAGEAL ECHOCARDIOGRAM (TEE);  Surgeon: [name], MD;  Location: [place];  Service: Open Heart Surgery;  Laterality: N/A;  . Multiple extractions with alveoloplasty  02/21/2012    Procedure: MULTIPLE EXTRACION WITH ALVEOLOPLASTY;  Surgeon: Lenn Cal, DDS;  Location: [facility];  Service: Oral Surgery;  Laterality: N/A;  Extaction  of tooth #'s 4,5,6,7,10,11,12,13,14,15,19,20,21,22,23,24,25,   . Left heart catheterization with coronary angiogram N/A 01/01/2012    Procedure: LEFT HEART CATHETERIZATION WITH CORONARY ANGIOGRAM;  Surgeon: Hillary Bow, MD;  Location: [facility] CATH LAB;  Service: Cardiovascular;  Laterality: N/A;  . Coronary artery bypass graft  01/02/2012    Procedure: CORONARY ARTERY BYPASS GRAFTING (CABG);  Surgeon: [name], MD;  Location: [place];  Service: Open Heart Surgery;  Laterality: N/A;  times three using left internal mammary artery and right endoscopically harvested saphenous vein  . Coronary angioplasty  1999, 2002  . Video bronchoscopy with endobronchial ultrasound N/A 01/26/2015    Procedure: VIDEO BRONCHOSCOPY WITH ENDOBRONCHIAL ULTRASOUND;  Surgeon: [name], MD;  Location: [place];  Service: Thoracic;  Laterality: N/A;  . Lung biopsy N/A 01/26/2015    Procedure: LUNG BIOPSY;  Surgeon: [name], MD;  Location: [place];  Service: Thoracic;  Laterality: N/A;  . Lymph node biopsy N/A 01/26/2015    Procedure: LYMPH NODE BIOPSY;  Surgeon: [name], MD;  Location: [place];  Service: Thoracic;  Laterality: N/A;  . Portacath placement Left 02/01/2015    Procedure: ATTEMPTED INSERTION OF PORT-A-CATH;  Surgeon: [name], MD;  Location: [place];  Service: Thoracic;  Laterality: Left;  .  Eus N/A 02/10/2015    Procedure: UPPER ENDOSCOPIC ULTRASOUND (EUS) LINEAR;  Surgeon: [name], MD;  Location: [facility];  Service: Endoscopy;  Laterality: N/A;   Family History  Problem Relation Age of Onset  . Cancer Neg Hx   . Heart disease Father   . Diabetes Father   . Hyperlipidemia Father   . Hypertension Father   . Hyperlipidemia Mother   . Hypertension Mother   . Stroke Mother   . Diabetes Paternal Grandmother    Social History  Substance Use Topics  . Smoking status: Current Every Day Smoker -- 0.50 packs/day for 35 years    Types: Cigarettes    Start date:  06/30/1976  . Smokeless tobacco: Never Used  . Alcohol Use: Yes     Comment: maybe a 12 pack/mth if that - very rare since 2013    Review of Systems  Constitutional: Negative for activity change and appetite change.  Eyes: Negative for pain.  Respiratory: Negative for chest tightness and shortness of breath.   Cardiovascular: Negative for chest pain and leg swelling.  Gastrointestinal: Negative for nausea, vomiting, abdominal pain and diarrhea.  Genitourinary: Negative for flank pain.  Musculoskeletal: Negative for back pain and neck stiffness.  Skin: Positive for rash.  Neurological: Negative for weakness, numbness and headaches.  Psychiatric/Behavioral: Negative for behavioral problems.      Allergies  Lisinopril and Tea  Home Medications   Prior to Admission medications   Medication Sig Start Date End Date Taking? Authorizing Provider  amLODipine (NORVASC) 5 MG tablet TAKE ONE (1) TABLET BY MOUTH EVERY DAY Patient taking differently: TAKE ONE (1) TABLET BY MOUTH AT BEDTIME 11/12/14  Yes [name], MD  aspirin EC 81 MG tablet Take 81 mg by mouth daily.   Yes Historical Provider, MD  carvedilol (COREG) 6.25 MG tablet TAKE ONE TABLET BY MOUTH TWICE A DAY WITH A MEAL 11/12/14  Yes [name], MD  HYDROcodone-acetaminophen [facility]) 10-325 MG tablet Take 1 tablet by mouth every 6 (six) hours as needed. Patient taking differently: Take 1 tablet by mouth every 6 (six) hours as needed for moderate pain.  04/13/15  Yes Manon Hilding Kefalas, PA-C  pravastatin (PRAVACHOL) 40 MG tablet TAKE ONE (1) TABLET BY MOUTH EVERY DAY Patient taking differently: TAKE ONE (1) TABLET BY MOUTH AT BEDTIME 11/12/14  Yes [name], MD  fentaNYL (DURAGESIC - DOSED MCG/HR) 50 MCG/HR Place 1 patch (50 mcg total) onto the skin every 3 (three) days. Patient not taking: Reported on 03/28/2015 02/14/15   [name], MD  ondansetron (ZOFRAN) 8 MG tablet Take 1 tablet (8 mg total) by mouth  every 8 (eight) hours as needed for nausea or vomiting. Patient not taking: Reported on 04/13/2015 02/02/15   [name], MD  prochlorperazine (COMPAZINE) 10 MG tablet Take 1 tablet (10 mg total) by mouth every 6 (six) hours as needed for nausea or vomiting. Patient not taking: Reported on 03/28/2015 02/02/15   [name], MD   BP 138/92 mmHg  Pulse 82  Temp(Src) 98 F (36.7 C) (Oral)  Resp 14  Ht '5\' 9"'$  (1.753 m)  Wt 215 lb (97.523 kg)  BMI 31.74 kg/m2  SpO2 100% Physical Exam  Constitutional: He appears well-developed.  HENT:  Head: Atraumatic.  Cardiovascular: Normal rate.   Pulmonary/Chest: Effort normal.  Abdominal: Soft.  Musculoskeletal:  Patient with erythematous nonblanching rash on the dorsum of the proximal phalanx of the fingers on the left  hand. Worse medially but progresses laterally. Also mildly raised it progresses with some swelling on the dorsum of the hand and with more splotchy areas proximally on the left hand. Also has 2 areas on the right proximal phalanx of the second and third finger. Strong radial pulse. Sensation intact. Good capillary refill.    ED Course  Procedures (including critical care time) Labs Review Labs Reviewed  COMPREHENSIVE METABOLIC PANEL - Abnormal; Notable for the following:    Chloride 99 (*)    AST 14 (*)    ALT 14 (*)    All other components within normal limits  CBC WITH DIFFERENTIAL/PLATELET - Abnormal; Notable for the following:    RBC 2.74 (*)    Hemoglobin 8.3 (*)    HCT 25.1 (*)    RDW 20.8 (*)    Platelets 86 (*)    All other components within normal limits    Imaging Review No results found. I have personally reviewed and evaluated these images and lab results as part of my medical decision-making.   EKG Interpretation None      MDM   Final diagnoses:  Hypersensitivity reaction, due to correct medicinal substance properly administered    Patient with hand redness and swelling. Likely  hypersensitivity reaction to his chemotherapy or Neulasta. Discussed with Dr. Lindi Adie. Will give steroids. Will have follow-up with his oncologist.    Davonna Belling, MD 05/01/15 [phone]

## 2015-05-02 ENCOUNTER — Telehealth (HOSPITAL_COMMUNITY): Payer: Self-pay | Admitting: *Deleted

## 2015-05-02 NOTE — Telephone Encounter (Signed)
Patient called (wife) to report he had ED visit this weekend for redness and swelling in hands, left worse than right. He was treated with dexamethasone in the ED. The swelling and redness has decreased. Discussed with Dr. Whitney Muse and relayed to patient no further treatment needed. He will call if condition worsens.

## 2015-05-06 ENCOUNTER — Encounter: Payer: Self-pay | Admitting: Surgery

## 2015-05-10 ENCOUNTER — Ambulatory Visit (HOSPITAL_COMMUNITY): Payer: Self-pay | Admitting: Hematology & Oncology

## 2015-05-10 ENCOUNTER — Encounter (HOSPITAL_COMMUNITY): Payer: BLUE CROSS/BLUE SHIELD

## 2015-05-10 ENCOUNTER — Encounter (HOSPITAL_COMMUNITY): Payer: Self-pay | Admitting: Hematology & Oncology

## 2015-05-10 ENCOUNTER — Encounter (HOSPITAL_COMMUNITY): Payer: BLUE CROSS/BLUE SHIELD | Attending: Hematology & Oncology

## 2015-05-10 ENCOUNTER — Encounter (HOSPITAL_BASED_OUTPATIENT_CLINIC_OR_DEPARTMENT_OTHER): Payer: BLUE CROSS/BLUE SHIELD | Admitting: Hematology & Oncology

## 2015-05-10 ENCOUNTER — Telehealth (HOSPITAL_COMMUNITY): Payer: Self-pay | Admitting: Hematology & Oncology

## 2015-05-10 VITALS — BP 113/91 | HR 81 | Temp 97.7°F | Resp 20 | Wt 217.1 lb

## 2015-05-10 VITALS — BP 137/74 | HR 75 | Temp 97.7°F | Resp 18

## 2015-05-10 DIAGNOSIS — C3490 Malignant neoplasm of unspecified part of unspecified bronchus or lung: Secondary | ICD-10-CM | POA: Diagnosis not present

## 2015-05-10 DIAGNOSIS — K8689 Other specified diseases of pancreas: Secondary | ICD-10-CM

## 2015-05-10 DIAGNOSIS — K869 Disease of pancreas, unspecified: Secondary | ICD-10-CM | POA: Diagnosis present

## 2015-05-10 DIAGNOSIS — C3491 Malignant neoplasm of unspecified part of right bronchus or lung: Secondary | ICD-10-CM

## 2015-05-10 DIAGNOSIS — Z720 Tobacco use: Secondary | ICD-10-CM

## 2015-05-10 DIAGNOSIS — R918 Other nonspecific abnormal finding of lung field: Secondary | ICD-10-CM | POA: Insufficient documentation

## 2015-05-10 DIAGNOSIS — D630 Anemia in neoplastic disease: Secondary | ICD-10-CM

## 2015-05-10 DIAGNOSIS — Z0000 Encounter for general adult medical examination without abnormal findings: Secondary | ICD-10-CM | POA: Diagnosis not present

## 2015-05-10 DIAGNOSIS — L270 Generalized skin eruption due to drugs and medicaments taken internally: Secondary | ICD-10-CM

## 2015-05-10 DIAGNOSIS — T50905A Adverse effect of unspecified drugs, medicaments and biological substances, initial encounter: Secondary | ICD-10-CM

## 2015-05-10 DIAGNOSIS — E871 Hypo-osmolality and hyponatremia: Secondary | ICD-10-CM

## 2015-05-10 DIAGNOSIS — Z5111 Encounter for antineoplastic chemotherapy: Secondary | ICD-10-CM | POA: Diagnosis not present

## 2015-05-10 DIAGNOSIS — D6481 Anemia due to antineoplastic chemotherapy: Secondary | ICD-10-CM | POA: Diagnosis not present

## 2015-05-10 DIAGNOSIS — T451X5A Adverse effect of antineoplastic and immunosuppressive drugs, initial encounter: Secondary | ICD-10-CM

## 2015-05-10 LAB — CBC WITH DIFFERENTIAL/PLATELET
BASOS ABS: 0.1 10*3/uL (ref 0.0–0.1)
BASOS PCT: 1 %
EOS ABS: 0.1 10*3/uL (ref 0.0–0.7)
Eosinophils Relative: 2 %
HEMATOCRIT: 28.5 % — AB (ref 39.0–52.0)
Hemoglobin: 9.6 g/dL — ABNORMAL LOW (ref 13.0–17.0)
Lymphocytes Relative: 28 %
Lymphs Abs: 1.9 10*3/uL (ref 0.7–4.0)
MCH: 31.1 pg (ref 26.0–34.0)
MCHC: 33.7 g/dL (ref 30.0–36.0)
MCV: 92.2 fL (ref 78.0–100.0)
MONO ABS: 0.7 10*3/uL (ref 0.1–1.0)
MONOS PCT: 11 %
NEUTROS ABS: 3.8 10*3/uL (ref 1.7–7.7)
Neutrophils Relative %: 58 %
PLATELETS: 260 10*3/uL (ref 150–400)
RBC: 3.09 MIL/uL — ABNORMAL LOW (ref 4.22–5.81)
RDW: 20.6 % — AB (ref 11.5–15.5)
WBC: 6.6 10*3/uL (ref 4.0–10.5)

## 2015-05-10 LAB — COMPREHENSIVE METABOLIC PANEL
ALBUMIN: 4.1 g/dL (ref 3.5–5.0)
ALT: 14 U/L — ABNORMAL LOW (ref 17–63)
ANION GAP: 10 (ref 5–15)
AST: 18 U/L (ref 15–41)
Alkaline Phosphatase: 70 U/L (ref 38–126)
BUN: 15 mg/dL (ref 6–20)
CHLORIDE: 98 mmol/L — AB (ref 101–111)
CO2: 27 mmol/L (ref 22–32)
Calcium: 8.9 mg/dL (ref 8.9–10.3)
Creatinine, Ser: 1.11 mg/dL (ref 0.61–1.24)
GFR calc Af Amer: >60 mL/min (ref >60)
GFR calc non Af Amer: >60 mL/min (ref >60)
GLUCOSE: 137 mg/dL — AB (ref 65–99)
POTASSIUM: 4 mmol/L (ref 3.5–5.1)
Sodium: 135 mmol/L (ref 135–145)
TOTAL PROTEIN: 7.7 g/dL (ref 6.5–8.1)
Total Bilirubin: 0.5 mg/dL (ref 0.3–1.2)

## 2015-05-10 MED ORDER — SODIUM CHLORIDE 0.9 % IJ SOLN: 10.0000 mL | INTRAMUSCULAR | Status: DC | PRN

## 2015-05-10 MED ORDER — TRIAMCINOLONE ACETONIDE 0.1 % EX CREA: 1.0000 "application " | TOPICAL_CREAM | Freq: Two times a day (BID) | CUTANEOUS | Status: DC

## 2015-05-10 MED ORDER — CARBOPLATIN CHEMO INJECTION 600 MG/60ML
628.5000 mg | Freq: Once | INTRAVENOUS | Status: AC
  Administered 2015-05-10: 630 mg via INTRAVENOUS
  Filled 2015-05-10: qty 63

## 2015-05-10 MED ORDER — PALONOSETRON HCL INJECTION 0.25 MG/5ML
INTRAVENOUS | Status: AC
  Filled 2015-05-10: qty 5

## 2015-05-10 MED ORDER — DEXTROSE 5 % IV SOLN
165.0000 mg/m2 | Freq: Once | INTRAVENOUS | Status: AC
  Administered 2015-05-10: 354 mg via INTRAVENOUS
  Filled 2015-05-10: qty 5.9

## 2015-05-10 MED ORDER — DIPHENOXYLATE-ATROPINE 2.5-0.025 MG PO TABS: ORAL_TABLET | ORAL | Status: DC

## 2015-05-10 MED ORDER — SODIUM CHLORIDE 0.9 % IV SOLN
Freq: Once | INTRAVENOUS | Status: AC
  Administered 2015-05-10: 11:00:00 via INTRAVENOUS
  Filled 2015-05-10: qty 5

## 2015-05-10 MED ORDER — SODIUM CHLORIDE 0.9 % IV SOLN
Freq: Once | INTRAVENOUS | Status: AC
  Administered 2015-05-10: 11:00:00 via INTRAVENOUS

## 2015-05-10 MED ORDER — PEGFILGRASTIM 6 MG/0.6ML ~~LOC~~ PSKT
6.0000 mg | PREFILLED_SYRINGE | Freq: Once | SUBCUTANEOUS | Status: AC
  Administered 2015-05-10: 6 mg via SUBCUTANEOUS
  Filled 2015-05-10: qty 0.6

## 2015-05-10 MED ORDER — PALONOSETRON HCL INJECTION 0.25 MG/5ML
0.2500 mg | Freq: Once | INTRAVENOUS | Status: AC
  Administered 2015-05-10: 0.25 mg via INTRAVENOUS

## 2015-05-10 NOTE — Patient Instructions (Addendum)
[place] at [facility] Discharge Instructions  RECOMMENDATIONS MADE BY THE CONSULTANT AND ANY TEST RESULTS WILL BE SENT TO YOUR REFERRING PHYSICIAN.     Exam and discussion completed by Dr Whitney Muse today The rash is a drug reaction.  We will stop etopiside. Switch to Camptosar This drug can cause diarrhea, you will have two medications that you can use to stop diarrhea.  If you can not get this diarrhea stopped with the medication, please call us so we can get you some fluids. Try taking claritin (over the counter) allergy pill, this may help some of the side effects from the neulasta. Triamcinolone cream called into your pharmacy, this should help your rash Return to see the doctor in 3 weeks. Return in 3 weeks for your last cycle of chemotherapy. We will set you up for scans after your last cycle. Please call the clinic if you have any questions or concerns.      Thank you for choosing [place] at [facility] to provide your oncology and hematology care.  To afford each patient quality time with our provider, please arrive at least 15 minutes before your scheduled appointment time.    You need to re-schedule your appointment should you arrive 10 or more minutes late.  We strive to give you quality time with our providers, and arriving late affects you and other patients whose appointments are after yours.  Also, if you no show three or more times for appointments you may be dismissed from the clinic at the providers discretion.     Again, thank you for choosing [facility]  Our hope is that these requests will decrease the amount of time that you wait before being seen by our physicians.       _____________________________________________________________  Should you have questions after your visit to [facility], please contact our office at [phone] between the hours of 8:30 a.m. and 4:30 p.m.   Voicemails left after 4:30 p.m. will not be returned until the following business day.  For prescription refill requests, have your pharmacy contact our office.        Irinotecan injection What is this medicine? IRINOTECAN (ir in oh TEE kan ) is a chemotherapy drug. It is used to treat colon and rectal cancer. This medicine may be used for other purposes; ask your health care provider or pharmacist if you have questions. What should I tell my health care provider before I take this medicine? They need to know if you have any of these conditions: -blood disorders -dehydration -diarrhea -infection (especially a virus infection such as chickenpox, cold sores, or herpes) -liver disease -low blood counts, like low white cell, platelet, or red cell counts -recent or ongoing radiation therapy -an unusual or allergic reaction to irinotecan, sorbitol, other chemotherapy, other medicines, foods, dyes, or preservatives -pregnant or trying to get pregnant -breast-feeding How should I use this medicine? This drug is given as an infusion into a vein. It is administered in a hospital or clinic by a specially trained health care professional. Talk to your pediatrician regarding the use of this medicine in children. Special care may be needed. Overdosage: If you think you have taken too much of this medicine contact a poison control center or emergency room at once. NOTE: This medicine is only for you. Do not share this medicine with others. What if I miss a dose? It is important not to miss your  dose. Call your doctor or health care professional if you are unable to keep an appointment. What may interact with this medicine? Do not take this medicine with any of the following medications: -atazanavir -certain medicines for fungal infections like itraconazole and ketoconazole -St. John's Wort This medicine may also interact with the following  medications: -dexamethasone -diuretics -laxatives -medicines for seizures like carbamazepine, mephobarbital, phenobarbital, phenytoin, primidone -medicines to increase blood counts like filgrastim, pegfilgrastim, sargramostim -prochlorperazine -vaccines This list may not describe all possible interactions. Give your health care provider a list of all the medicines, herbs, non-prescription drugs, or dietary supplements you use. Also tell them if you smoke, drink alcohol, or use illegal drugs. Some items may interact with your medicine. What should I watch for while using this medicine? Your condition will be monitored carefully while you are receiving this medicine. You will need important blood work done while you are taking this medicine. This drug may make you feel generally unwell. This is not uncommon, as chemotherapy can affect healthy cells as well as cancer cells. Report any side effects. Continue your course of treatment even though you feel ill unless your doctor tells you to stop. In some cases, you may be given additional medicines to help with side effects. Follow all directions for their use. You may get drowsy or dizzy. Do not drive, use machinery, or do anything that needs mental alertness until you know how this medicine affects you. Do not stand or sit up quickly, especially if you are an older patient. This reduces the risk of dizzy or fainting spells. Call your doctor or health care professional for advice if you get a fever, chills or sore throat, or other symptoms of a cold or flu. Do not treat yourself. This drug decreases your body's ability to fight infections. Try to avoid being around people who are sick. This medicine may increase your risk to bruise or bleed. Call your doctor or health care professional if you notice any unusual bleeding. Be careful brushing and flossing your teeth or using a toothpick because you may get an infection or bleed more easily. If you have any  dental work done, tell your dentist you are receiving this medicine. Avoid taking products that contain aspirin, acetaminophen, ibuprofen, naproxen, or ketoprofen unless instructed by your doctor. These medicines may hide a fever. Do not become pregnant while taking this medicine. Women should inform their doctor if they wish to become pregnant or think they might be pregnant. There is a potential for serious side effects to an unborn child. Talk to your health care professional or pharmacist for more information. Do not breast-feed an infant while taking this medicine. What side effects may I notice from receiving this medicine? Side effects that you should report to your doctor or health care professional as soon as possible: -allergic reactions like skin rash, itching or hives, swelling of the face, lips, or tongue -low blood counts - this medicine may decrease the number of white blood cells, red blood cells and platelets. You may be at increased risk for infections and bleeding. -signs of infection - fever or chills, cough, sore throat, pain or difficulty passing urine -signs of decreased platelets or bleeding - bruising, pinpoint red spots on the skin, black, tarry stools, blood in the urine -signs of decreased red blood cells - unusually weak or tired, fainting spells, lightheadedness -breathing problems -chest pain -diarrhea -feeling faint or lightheaded, falls -flushing, runny nose, sweating during infusion -mouth sores or pain -  pain, swelling, redness or irritation where injected -pain, swelling, warmth in the leg -pain, tingling, numbness in the hands or feet -problems with balance, talking, walking -stomach cramps, pain -trouble passing urine or change in the amount of urine -vomiting as to be unable to hold down drinks or food -yellowing of the eyes or skin Side effects that usually do not require medical attention (report to your doctor or health care professional if they  continue or are bothersome): -constipation -hair loss -headache -loss of appetite -nausea, vomiting -stomach upset This list may not describe all possible side effects. Call your doctor for medical advice about side effects. You may report side effects to FDA at 1-800-FDA-1088. Where should I keep my medicine? This drug is given in a hospital or clinic and will not be stored at home. NOTE: This sheet is a summary. It may not cover all possible information. If you have questions about this medicine, talk to your doctor, pharmacist, or health care provider.    2016, [employer]. (2012-10-06 16:29:32)

## 2015-05-10 NOTE — Progress Notes (Signed)
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    01/18/2015 Imaging CT chest- 8.7 x 8.7 cm R hilar mass encasing the R pulm artery and occluding the R upper lobe bronchus. Sm R pleural effusion; malignant or reactive. 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 34 mm x 26 mm mass in body of pancreas   01/21/2015 Imaging MRI brain- No acute intracranial findings. No signs of metastatic disease. Minor white matter disease, stable and nonspecific.   01/26/2015 Pathology Results Lung, biopsy, right upper lobe - SMALL CELL CARCINOMA.   01/26/2015 Pathology Results Diagnosis FINE NEEDLE ASPIRATION: ENDOSCOPIC SPECIMEN A, EBUS LEVEL 7 NODE (SPECIMEN 1 OF 3 COLLECTED 01/26/2015) MALIGNANT CELLS PRESENT, CONSISTENT WITH SMALL CELL CARCINOMA.   01/26/2015 Pathology Results WANG NEEDLE ASPIRATION, SPECIMEN B LEVEL 7 NODE (SPECIMEN 2 OF 3 COLLECTED 01/26/2015) MALIGNANT CELLS PRESENT, CONSISTENT WITH SMALL CELL CARCINOMA.   01/26/2015  Pathology Results Diagnosis BRONCHIAL BRUSHING SPECIMEN C, RIGHT UPPER LOBE (SPECIMEN 3 OF 3 COLLECTED 01/26/2015) ATYPICAL CELLS PRESENT.   01/27/2015 PET scan Large hypermetabolic right hilar mass with mediastinal invasion and infrahilar extension, maximum standard uptake value 13.0.  Pancreatic tail mass, approximately 4.5 cm in diameter. Abdominal aortic aneurysm, 5.8 cm in diameter.   02/10/2015 Pathology Results Diagnosis FINE NEEDLE ASPIRATION, ENDOSCOPIC, PANCREAS TAIL (SPECIMEN 1 OF 1 COLLECTED 02/10/15): MALIGNANT CELLS CONSISTENT WITH METASTATIC SMALL CELL CARCINOMA.   02/14/2015 - 03/06/2015 Chemotherapy Cisplatin/Etoposide   03/06/2015 Treatment Plan Change Change systemic therapy to Carboplatin based   03/07/2015 - 05/10/2015 Chemotherapy Carboplatin/Etoposide.   04/13/2015 Imaging resolution of pancreatic metastases, decrease in size of R perihilar mss and RUL mass. Interval response to therapy   05/10/2015 -  Chemotherapy Change to carboplatin/irinotecan for cycle #5 and #6 secondary to significant skin toxicity presumably from etoposide     HISTORY OF PRESENTING ILLNESS:  [name] Vanwyhe89.o. male is here for further up of extensive stage SCLC.   Mr. PeKarges here alone today.  Reports redness of the hands nine days after chemotherapy that worsened to severe hand swelling the next day. He was unable to take off his wedding ring. Denies using any chemicals or painting materials. He tried hand lotion but it did not help. This type of skin reaction has never happened to him before. Denies rash anywhere else on his body, other than on both hands. He went to the ER for this hand swelling. Hands are better but red, peeling and still slightly swollen.  His breathing is good. His energy is good. He has no  other major complaints today. He has questions about whole brain XRT and is willing to meet with radiation oncology to discuss.  MEDICAL HISTORY:  Past Medical History  Diagnosis Date    . Coronary artery disease 2002    Lesion in LAD s/p angioplasty; s/p CABG x 4 Sept 2013  . Tobacco abuse   . Myocardial infarct [facility]) [id number]    stents  . Hyperlipidemia 1999  . Essential hypertension, benign 1999  . Lung mass   . COPD (chronic obstructive pulmonary disease) ([place])   . Shortness of breath dyspnea     with exertion, sleeps with more pillows under head  . Arthritis   . Pancreatitis   . Complication of anesthesia     woke up while being extubated and remembers fighting  . Cancer ([place])     lung  . Mass of pancreas     SURGICAL HISTORY: Past Surgical History  Procedure Laterality Date  . Knee surgery      left x 2  ACL repair  . Tee without cardioversion  01/02/2012    Procedure: TRANSESOPHAGEAL ECHOCARDIOGRAM (TEE);  Surgeon: [name], MD;  Location: [facility];  Service: Open Heart Surgery;  Laterality: N/A;  . Multiple extractions with alveoloplasty  02/21/2012    Procedure: MULTIPLE EXTRACION WITH ALVEOLOPLASTY;  Surgeon: Lenn Cal, DDS;  Location: [facility];  Service: Oral Surgery;  Laterality: N/A;  Extaction of tooth #'s 4,5,6,7,10,11,12,13,14,15,19,20,21,22,23,24,25,   . Left heart catheterization with coronary angiogram N/A 01/01/2012    Procedure: LEFT HEART CATHETERIZATION WITH CORONARY ANGIOGRAM;  Surgeon: Hillary Bow, MD;  [facility]: [facility] CATH LAB;  Service: Cardiovascular;  Laterality: N/A;  . Coronary artery bypass graft  01/02/2012    Procedure: CORONARY ARTERY BYPASS GRAFTING (CABG);  Surgeon: [name], MD;  Location: [facility];  Service: Open Heart Surgery;  Laterality: N/A;  times three using left internal mammary artery and right endoscopically harvested saphenous vein  . Coronary angioplasty  1999, 2002  . Video bronchoscopy with endobronchial ultrasound N/A 01/26/2015    Procedure: VIDEO BRONCHOSCOPY WITH ENDOBRONCHIAL ULTRASOUND;  Surgeon: [name], MD;  Location: [facility];  Service: Thoracic;  Laterality: N/A;  . Lung  biopsy N/A 01/26/2015    Procedure: LUNG BIOPSY;  Surgeon: [name], MD;  Location: [facility];  Service: Thoracic;  Laterality: N/A;  . Lymph node biopsy N/A 01/26/2015    Procedure: LYMPH NODE BIOPSY;  Surgeon: [name], MD;  Location: [facility];  Service: Thoracic;  Laterality: N/A;  . Portacath placement Left 02/01/2015    Procedure: ATTEMPTED INSERTION OF PORT-A-CATH;  Surgeon: [name], MD;  Location: [facility];  Service: Thoracic;  Laterality: Left;  . Eus N/A 02/10/2015    Procedure: UPPER ENDOSCOPIC ULTRASOUND (EUS) LINEAR;  Surgeon: [name], MD;  [facility];  Service: Endoscopy;  Laterality: N/A;    SOCIAL HISTORY: Social History   Social History  . Marital Status: Married    Spouse Name: N/A  . Number of Children: 5  . Years of Education: N/A   Occupational History  . Works in [place]  . Smoking status: Current Every Day Smoker -- 0.50 packs/day for 35 years    Types: Cigarettes    Start date: 06/30/1976  . Smokeless tobacco: Never Used  . Alcohol Use: Yes     Comment: maybe a 12 pack/mth if that - very rare since 2013  . Drug  Use: No  . Sexual Activity: Yes   Other Topics Concern  . Not on file   Social History Narrative   Darral Dash been married 17 years, with five kids.  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 Cancer Neg Hx   . Heart disease Father   . Diabetes Father   . Hyperlipidemia Father   . Hypertension Father   . Hyperlipidemia Mother   . Hypertension Mother   . Stroke Mother   . Diabetes Paternal Grandmother    indicated that his mother  is alive. He indicated that his father is alive. He indicated that his sister is alive. He indicated that his brother is alive. He indicated that his maternal grandmother is deceased. He indicated that his maternal grandfather is deceased. He indicated that his paternal grandmother is deceased. He indicated that his paternal grandfather is deceased.   His father's somewhere in [place]; his mother lives here in [place] and works at [employer] 76 at the end of this month She's got high blood pressure, high cholesterol, no strokes that they know of; is on blood thinners Has an aneurism at the base of the aorta where it goes into your stomach He knows his father's had high blood pressure and diabetes; Parents have been divorced since he was 3-4 Has half brothers / half sisters   ALLERGIES:  is allergic to lisinopril and tea.  MEDICATIONS:  Current Outpatient Prescriptions  Medication Sig Dispense Refill  . amLODipine (NORVASC) 5 MG tablet TAKE ONE (1) TABLET BY MOUTH EVERY DAY (Patient taking differently: TAKE ONE (1) TABLET BY MOUTH AT BEDTIME) 90 tablet 6  . aspirin EC 81 MG tablet Take 81 mg by mouth daily.    . carvedilol (COREG) 6.25 MG tablet TAKE ONE TABLET BY MOUTH TWICE A DAY WITH A MEAL 180 tablet 3  . fentaNYL (DURAGESIC - DOSED MCG/HR) 50 MCG/HR Place 1 patch (50 mcg total) onto the skin every 3 (three) days. 10 patch 0  . HYDROcodone-acetaminophen (NORCO) 10-325 MG tablet Take 1 tablet by mouth every 6 (six) hours as needed. (Patient taking differently: Take 1 tablet by mouth every 6 (six) hours as needed for moderate pain. ) 90 tablet 0  . pravastatin (PRAVACHOL) 40 MG tablet TAKE ONE (1) TABLET BY MOUTH EVERY DAY (Patient taking differently: TAKE ONE (1) TABLET BY MOUTH AT BEDTIME) 90 tablet 6  . diphenoxylate-atropine (LOMOTIL) 2.5-0.025 MG tablet May take 1-2 tabs four times a day as needed for loose stools. 45 tablet 0  . ondansetron (ZOFRAN) 8 MG tablet Take 1 tablet  (8 mg total) by mouth every 8 (eight) hours as needed for nausea or vomiting. (Patient not taking: Reported on 04/13/2015) 30 tablet 2  . prochlorperazine (COMPAZINE) 10 MG tablet Take 1 tablet (10 mg total) by mouth every 6 (six) hours as needed for nausea or vomiting. (Patient not taking: Reported on 03/28/2015) 30 tablet 2  . triamcinolone cream (KENALOG) 0.1 % Apply 1 application topically 2 (two) times daily. 80 g 1   No current facility-administered medications for this visit.  Review of Systems  Constitutional: Negative for fever, chills, weight loss and malaise/fatigue.  HENT: Negative for congestion, hearing loss, nosebleeds, sore throat and tinnitus.   Eyes: Negative for blurred vision, double vision, pain and discharge.  Respiratory: Negative for hemoptysis, cough, shortness of breath and wheezing.   Cardiovascular: Negative for palpitations, claudication, leg swelling and PND.  Gastrointestinal: Negative for heartburn, nausea, vomiting, abdominal pain, diarrhea, constipation, blood in stool and melena.  Genitourinary: Negative for dysuria, urgency, frequency and hematuria.  Musculoskeletal: Positive for joint pain. Negative for myalgias and falls.       Has had knee surgeries.  Skin: Positive for rash. Negative for itching.      Etoposide-related skin reaction. Rash began nine days after chemotherapy. Rash worsened to hand swelling over night. Neurological: Negative for dizziness, tingling, tremors, sensory change, speech change, focal weakness, seizures, loss of consciousness, weakness and headaches.  Endo/Heme/Allergies: Does not bruise/bleed easily.  Psychiatric/Behavioral: Negative for depression, suicidal ideas, memory loss and substance abuse. The patient is not nervous/anxious and does not have insomnia.   All other systems reviewed and are negative.  14 point ROS was done and is otherwise as detailed above or in HPI   PHYSICAL EXAMINATION: ECOG PERFORMANCE STATUS: 0 -  Asymptomatic   Vitals with BMI 05/10/2015  Height   Weight 217 lbs 2 oz  BMI   Systolic 518  Diastolic 91  Pulse 81  Respirations 20   Physical Exam  Constitutional: He is oriented to person, place, and time and well-developed, well-nourished, and in no distress. Alopecia  HENT:  Head: Normocephalic and atraumatic.  Nose: Nose normal.  Mouth/Throat: Oropharynx is clear and moist. No oropharyngeal exudate.  Dentures on top and bottom.  Eyes: Conjunctivae and EOM are normal. Pupils are equal, round, and reactive to light. Right eye exhibits no discharge. Left eye exhibits no discharge. No scleral icterus.  Neck: Normal range of motion. Neck supple. No tracheal deviation present. No thyromegaly present.  Cardiovascular: Normal rate, regular rhythm and normal heart sounds.  Exam reveals no gallop and no friction rub.   No murmur heard. Pulmonary/Chest: Effort normal. He has no wheezes. He has no rales.  Abdominal: Soft. Bowel sounds are normal. He exhibits no distension and no mass. There is no tenderness. There is no rebound and no guarding.  Musculoskeletal: Normal range of motion. He exhibits no edema.  Lymphadenopathy:    He has no cervical adenopathy.  Neurological: He is alert and oriented to person, place, and time. He has normal reflexes. No cranial nerve deficit. Gait normal. Coordination normal.  Skin: Skin is warm and dry.  Erythematous, confluent raised rash with desquamation - bilateral on the hands, much worse on the left. Psychiatric: Mood, memory, affect and judgment normal.  Nursing note and vitals reviewed.   LABORATORY DATA:  I have reviewed the data as listed.  CBC    Component Value Date/Time   WBC 6.6 05/10/2015 0839   RBC 3.09* 05/10/2015 0839   HGB 9.6* 05/10/2015 0839   HCT 28.5* 05/10/2015 0839   PLT 260 05/10/2015 0839   MCV 92.2 05/10/2015 0839   MCH 31.1 05/10/2015 0839   MCHC 33.7 05/10/2015 0839   RDW 20.6* 05/10/2015 0839   LYMPHSABS 1.9  05/10/2015 0839   MONOABS 0.7 05/10/2015 0839   EOSABS 0.1 05/10/2015 0839   BASOSABS 0.1 05/10/2015 0839   CMP     Component Value Date/Time   NA 135 05/10/2015 0839   K 4.0 05/10/2015 0839  CL 98* 05/10/2015 0839   CO2 27 05/10/2015 0839   GLUCOSE 137* 05/10/2015 0839   BUN 15 05/10/2015 0839   CREATININE 1.11 05/10/2015 0839   CREATININE 0.99 01/27/2015 1450   CALCIUM 8.9 05/10/2015 0839   PROT 7.7 05/10/2015 0839   ALBUMIN 4.1 05/10/2015 0839   AST 18 05/10/2015 0839   ALT 14* 05/10/2015 0839   ALKPHOS 70 05/10/2015 0839   BILITOT 0.5 05/10/2015 0839   GFRNONAA >60 05/10/2015 0839   GFRNONAA 87 01/27/2015 1450   GFRAA >60 05/10/2015 0839   GFRAA >89 01/27/2015 1450    RADIOGRAPHIC STUDIES: I have personally reviewed the radiological images as listed and agreed with the findings in the report.  Study Result     CLINICAL DATA: Stage IV small cell lung cancer.  EXAM: CT CHEST AND ABDOMEN WITH CONTRAST  TECHNIQUE: Multidetector CT imaging of the chest and abdomen was performed following the standard protocol during bolus administration of intravenous contrast.  CONTRAST: 166m OMNIPAQUE IOHEXOL 300 MG/ML SOLN  COMPARISON: 01/27/2015  FINDINGS: CT CHEST  Mediastinum: Normal heart size. Previous median sternotomy and CABG procedure. No pericardial effusion. Aortic atherosclerosis noted. No enlarged supraclavicular or axillary lymph nodes. The central right perihilar mass invading the mediastinum has decreased in size. This measures 5.6 x 2.1 cm, image 26 of series 2. Previous this measured 8.9 x 9.3 cm.  Lungs/Pleura: No pleural fluid. Moderate changes of paraseptal and centrilobular emphysema identified. The right upper lobe lung mass measures 0.5 x 1.0 cm, image 16 of series 6. This is compared with 1.8 x 2.2 cm previously. Residual scar like densities are noted within the right upper lobe, image number 30 of series 6 and image number 20 of  series 6. There are 2 tiny peripheral nodules within the right upper lobe which measure 3 mm.  Musculoskeletal: There is no aggressive lytic or sclerotic bone lesions identified.  CT ABDOMEN  Hepatobiliary: No suspicious liver abnormalities identified. The gallbladder appears normal. There is no biliary dilatation.  Pancreas: Resolution of previous lesion involving tail of pancreas  Spleen: The spleen is unremarkable.  Adrenals/Urinary Tract: The adrenal glands appear normal. Unremarkable appearance of the kidneys.  Stomach/Bowel: The stomach is normal. There is no pathologic dilatation of the upper abdominal bowel loops.  Vascular/Lymphatic: Aortic atherosclerosis is noted. Infrarenal abdominal aortic aneurysm measures 6.1 cm, image 82 of series 2.  Other: No free fluid or fluid collections within the upper abdomen.  Musculoskeletal: Degenerative disc disease noted within the thoracic and lumbar spine.  IMPRESSION: 1. Interval response to therapy. 2. Decrease in size of large right perihilar lung mass invading the mediastinum. The right upper lobe lung lesion has also decreased in size in the interval. 3. Resolution of pancreas metastasis. 4. Infrarenal abdominal aortic aneurysm. Vascular surgery consultation recommended due to increased risk of rupture for AAA >5.5 cm. This recommendation follows ACR consensus guidelines: White Paper of the ACR Incidental Findings Committee II on Vascular Findings. J Am Coll Radiol 2013; 10:789-794.   Electronically Signed  By: TKerby MoorsM.D.  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 Large hypermetabolic right hilar mass with mediastinal invasion and infrahilar extension  Hyponatremia c/w SIADH  EUS with Dr. Ardis Hughs and pancreatic biopsy on 02/10/2015 c/w SCLC Anemia secondary to chemotherapy CT C/A/P 04/13/2015 with interval response to therapy. Presumed drug reaction to etoposide Treatment related anemia   I am going to change therapy to irinotecan.  I do not feel comfortable at this point continuing with etoposide. His exam is c/w a drug rash. I do feel he should receive 6 cycles given his initial response to therapy and excellent tolerance.  He has cut back on smoking but still smokes 1/2 pack daily.  I have encouraged him to continue to try to quit. Goal is to complete 6 cycles of therapy.  We will refer him to XRT for consideration of whole brain XRT at the conclusion of chemotherapy.  He says he's good in terms of refills.  I have addressed early and late diarrhea from camptosar.. I explained at length the importance of contacting us if he has severe diarrhea while on this new medication. Hildred Alamin RN, our nurse navigator, will meet with the patient and further explain this medication with him today.  I have prescribed Mr. Ingalsbe a steroid cream to resolve his current bilateral hand rash.  I will see him again in 3 weeks. At this time, he will receive his last cycle of chemotherapy and we will schedule him for repeat imaging. I will refer him to XRT at his next appointment.  All questions were answered. The patient knows to call the clinic with any problems, questions or concerns.  This document serves as a record of services personally performed by Ancil Linsey, MD. It was created on her behalf by Arlyce Harman, a trained medical scribe. The creation of this record is  based on the scribe's personal observations and the provider's statements to them. This document has been checked and approved by the attending provider.  I have reviewed the above documentation for accuracy and completeness, and I agree with the above.  This note was electronically signed.    Kelby Fam. Savanna Dooley, MD   05/11/2015 4:45 PM

## 2015-05-10 NOTE — Patient Instructions (Addendum)
[facility] Day Discharge Instructions for Patients Receiving Chemotherapy  Today you received the following chemotherapy agents Carboplatin and Irinotecan.   Irinotecan - diarrhea, bone marrow suppression, hair loss, abdominal cramping. This drug can cause early and late diarrhea. Early diarrhea can occur within 24 hours of administration. (takes 2 hours to infuse)  We recommend Imodium and/or Lomotil for diarrhea/loose stools.  For diarrhea, you take Imodium 2 tablets @ the onset of diarrhea, and then 1 tablet every 2 hours until 12 hours have passed without a bowel movement. May take 2 tablets @ bedtime and every 4 hours until morning. If diarrhea recurs repeat. Call [facility] and let us know that you are having diarrhea and whether or not the Imodium is working.   Lomotil. Take 1-2 tablets four times a day as needed for loose stools/diarrhea.  You may take the Imodium and the Lomotil together or an an alternating schedule as needed for loose stools.   Please let us know if you are having loose stools!  Neulasta OnPro injector placed on right upper arm. Device will dispense medication between 5pm and 6pm tomorrow Wednesday Jan 18th. You may remove device after 6pm tomorrow.  To help prevent nausea and vomiting after your treatment, we encourage you to take your nausea medication Zofran '8mg'$  every 8 hours as needed for nausea or vomiting      If you develop nausea and vomiting, or diarrhea that is not controlled by your medication, call the clinic.  The clinic phone number is [phone]. Office hours are Monday-Friday 8:30am-5:00pm.  BELOW ARE SYMPTOMS THAT SHOULD BE REPORTED IMMEDIATELY:  *FEVER GREATER THAN 101.0 F  *CHILLS WITH OR WITHOUT FEVER  NAUSEA AND VOMITING THAT IS NOT CONTROLLED WITH YOUR NAUSEA MEDICATION  *UNUSUAL SHORTNESS OF BREATH  *UNUSUAL BRUISING OR BLEEDING  TENDERNESS IN MOUTH AND THROAT WITH OR WITHOUT PRESENCE OF ULCERS  *URINARY  PROBLEMS  *BOWEL PROBLEMS  UNUSUAL RASH  Items with * indicate a potential emergency and should be followed up as soon as possible. If you have an emergency after office hours please contact your primary care physician or go to the nearest emergency department.  Please call the clinic during office hours if you have any questions or concerns.   You may also contact the Patient Navigator at [phone] should you have any questions or need assistance in obtaining follow up care.

## 2015-05-10 NOTE — Progress Notes (Signed)
Tolerated chemo well. [name] KitchenRoby [name] arrived today for [facility] neulasta on body injector. See MAR for administration details. Injector in place and engaged with green light indicator on flashing. Tolerated application with out problems.

## 2015-05-10 NOTE — Patient Instructions (Signed)
[facility] Discharge Instructions for Patients Receiving Chemotherapy  Today you received the following chemotherapy agents Carboplatin and Irinotecan.   Irinotecan - diarrhea, bone marrow suppression, hair loss, abdominal cramping. This drug can cause early and late diarrhea. Early diarrhea can occur within 24 hours of administration. (takes 2 hours to infuse)  We recommend Imodium and/or Lomotil for diarrhea/loose stools.  For diarrhea, you take Imodium 2 tablets @ the onset of diarrhea, and then 1 tablet every 2 hours until 12 hours have passed without a bowel movement. May take 2 tablets @ bedtime and every 4 hours until morning. If diarrhea recurs repeat. Call [facility] and let us know that you are having diarrhea and whether or not the Imodium is working.   Lomotil. Take 1-2 tablets four times a day as needed for loose stools/diarrhea.  You may take the Imodium and the Lomotil together or an an alternating schedule as needed for loose stools.   Please let us know if you are having loose stools!   To help prevent nausea and vomiting after your treatment, we encourage you to take your nausea medication Zofran '8mg'$  every 8 hours as needed for nausea or vomiting      If you develop nausea and vomiting, or diarrhea that is not controlled by your medication, call the clinic.  The clinic phone number is [phone]. Office hours are Monday-Friday 8:30am-5:00pm.  BELOW ARE SYMPTOMS THAT SHOULD BE REPORTED IMMEDIATELY:  *FEVER GREATER THAN 101.0 F  *CHILLS WITH OR WITHOUT FEVER  NAUSEA AND VOMITING THAT IS NOT CONTROLLED WITH YOUR NAUSEA MEDICATION  *UNUSUAL SHORTNESS OF BREATH  *UNUSUAL BRUISING OR BLEEDING  TENDERNESS IN MOUTH AND THROAT WITH OR WITHOUT PRESENCE OF ULCERS  *URINARY PROBLEMS  *BOWEL PROBLEMS  UNUSUAL RASH  Items with * indicate a potential emergency and should be followed up as soon as possible. If you have an emergency after office  hours please contact your primary care physician or go to the nearest emergency department.  Please call the clinic during office hours if you have any questions or concerns.   You may also contact the Patient Navigator at [phone] should you have any questions or need assistance in obtaining follow up care.

## 2015-05-10 NOTE — Telephone Encounter (Signed)
PC TO [employer] [id number] SPOKE WITH NISHA L. ?'D IF [id number] PER NISHA L THE ALOXI 2469 AND EMEND 71 DO REQUIRE AUTH. SHE WILL FAX OVER FORM [facility] [phone]

## 2015-05-11 ENCOUNTER — Telehealth (HOSPITAL_COMMUNITY): Payer: Self-pay | Admitting: *Deleted

## 2015-05-11 ENCOUNTER — Inpatient Hospital Stay (HOSPITAL_COMMUNITY): Payer: Self-pay

## 2015-05-11 NOTE — Telephone Encounter (Signed)
Spoke with patient to follow up after first dose irinotecan. States he did not have any problems last night. States his stomach cramped one time while he was here during treatment but no other problems since. Patient knows to contact clinic with any issues/concerns.

## 2015-05-12 ENCOUNTER — Inpatient Hospital Stay (HOSPITAL_COMMUNITY): Payer: Self-pay

## 2015-05-16 ENCOUNTER — Ambulatory Visit
Admission: RE | Admit: 2015-05-16 | Discharge: 2015-05-16 | Disposition: A | Discharge status: Home / Self Care | Payer: BLUE CROSS/BLUE SHIELD | Source: Ambulatory Visit | Attending: Surgery | Admitting: Surgery

## 2015-05-16 ENCOUNTER — Ambulatory Visit (INDEPENDENT_AMBULATORY_CARE_PROVIDER_SITE_OTHER): Payer: BLUE CROSS/BLUE SHIELD | Admitting: Surgery

## 2015-05-16 ENCOUNTER — Encounter: Payer: Self-pay | Admitting: Surgery

## 2015-05-16 VITALS — BP 135/79 | HR 84 | Temp 97.0°F | Ht 69.0 in | Wt 215.6 lb

## 2015-05-16 DIAGNOSIS — I714 Abdominal aortic aneurysm, without rupture, unspecified: Secondary | ICD-10-CM

## 2015-05-16 MED ORDER — IOPAMIDOL (ISOVUE-370) INJECTION 76%
75.0000 mL | Freq: Once | INTRAVENOUS | Status: AC | PRN
  Administered 2015-05-16: 75 mL via INTRAVENOUS

## 2015-05-16 NOTE — Progress Notes (Signed)
Patient name: [name] MRN: [id number] DOB: October 21, 1960 Sex: male     Chief Complaint  Patient presents with  . Re-evaluation    3 month f/u - s/p angiogram     HISTORY OF PRESENT ILLNESS:  this is a 55 year old gentleman who is back for follow-up of his abdominal aortic aneurysm. This was initially detected when he underwent a PET scan for his lung cancer workup. He was diagnosed with  Stage IV small cell lung cancer.  He is undergone chemotherapy with a good response.   the patient as a history of emergent CABG.  He is a long-term smoker and heavy alcohol user. He suffers of hypercholesterolemia which is managed with a statin.  He is medically managed for hypertension. His PET scan indicated a 5.5-5.7 infrarenal aneurysm. Because of his newly diagnosed cancer I felt that we should observe him for a while with short follow-up.  He is here today with a repeat CT scan.  He has no complaints regarding abdominal pain or back pain.  Past Medical History  Diagnosis Date  . Coronary artery disease 2002    Lesion in LAD s/p angioplasty; s/p CABG x 4 Sept 2013  . Tobacco abuse   . Myocardial infarct [facility]) [id number]    stents  . Hyperlipidemia 1999  . Essential hypertension, benign 1999  . Lung mass   . COPD (chronic obstructive pulmonary disease) ([place])   . Shortness of breath dyspnea     with exertion, sleeps with more pillows under head  . Arthritis   . Pancreatitis   . Complication of anesthesia     woke up while being extubated and remembers fighting  . Cancer ([place])     lung  . Mass of pancreas     Past Surgical History  Procedure Laterality Date  . Knee surgery      left x 2  ACL repair  . Tee without cardioversion  01/02/2012    Procedure: TRANSESOPHAGEAL ECHOCARDIOGRAM (TEE);  Surgeon: [name], MD;  Location: [place];  Service: Open Heart Surgery;  Laterality: N/A;  . Multiple extractions with alveoloplasty  02/21/2012    Procedure: MULTIPLE EXTRACION WITH  ALVEOLOPLASTY;  Surgeon: Lenn Cal, DDS;  Location: [facility];  Service: Oral Surgery;  Laterality: N/A;  Extaction of tooth #'s 4,5,6,7,10,11,12,13,14,15,19,20,21,22,23,24,25,   . Left heart catheterization with coronary angiogram N/A 01/01/2012    Procedure: LEFT HEART CATHETERIZATION WITH CORONARY ANGIOGRAM;  Surgeon: Hillary Bow, MD;  Location: [facility] CATH LAB;  Service: Cardiovascular;  Laterality: N/A;  . Coronary artery bypass graft  01/02/2012    Procedure: CORONARY ARTERY BYPASS GRAFTING (CABG);  Surgeon: [name], MD;  Location: [place];  Service: Open Heart Surgery;  Laterality: N/A;  times three using left internal mammary artery and right endoscopically harvested saphenous vein  . Coronary angioplasty  1999, 2002  . Video bronchoscopy with endobronchial ultrasound N/A 01/26/2015    Procedure: VIDEO BRONCHOSCOPY WITH ENDOBRONCHIAL ULTRASOUND;  Surgeon: [name], MD;  Location: [place];  Service: Thoracic;  Laterality: N/A;  . Lung biopsy N/A 01/26/2015    Procedure: LUNG BIOPSY;  Surgeon: [name], MD;  Location: [place];  Service: Thoracic;  Laterality: N/A;  . Lymph node biopsy N/A 01/26/2015    Procedure: LYMPH NODE BIOPSY;  Surgeon: [name], MD;  Location: [place];  Service: Thoracic;  Laterality: N/A;  . Portacath placement Left 02/01/2015    Procedure: ATTEMPTED INSERTION OF PORT-A-CATH;  Surgeon: [name], MD;  Location: [place];  Service: Thoracic;  Laterality: Left;  . Eus N/A 02/10/2015    Procedure: UPPER ENDOSCOPIC ULTRASOUND (EUS) LINEAR;  Surgeon: [name], MD;  Location: [facility];  Service: Endoscopy;  Laterality: N/A;    Social History   Social History  . Marital Status: Married    Spouse Name: N/A  . Number of Children: 5  . Years of Education: N/A   Occupational History  . Works in [employer]  . Smoking status: Current Every Day Smoker -- 0.50 packs/day for 35 years    Types:  Cigarettes    Start date: 06/30/1976  . Smokeless tobacco: Never Used  . Alcohol Use: 0.0 oz/week    0 Standard drinks or equivalent per week     Comment: maybe a 12 pack/mth if that - very rare since 2013  . Drug Use: No  . Sexual Activity: Yes   Other Topics Concern  . Not on file   Social History Narrative    Family History  Problem Relation Age of Onset  . Cancer Neg Hx   . Heart disease Father   . Diabetes Father   . Hyperlipidemia Father   . Hypertension Father   . Hyperlipidemia Mother   . Hypertension Mother   . Stroke Mother   . Diabetes Paternal Grandmother     Allergies as of 05/16/2015 - Review Complete 05/16/2015  Allergen Reaction Noted  . Lisinopril Cough 11/27/2010  . Tea Hives 01/03/2012    Current Outpatient Prescriptions on File Prior to Visit  Medication Sig Dispense Refill  . amLODipine (NORVASC) 5 MG tablet TAKE ONE (1) TABLET BY MOUTH EVERY DAY (Patient taking differently: TAKE ONE (1) TABLET BY MOUTH AT BEDTIME) 90 tablet 6  . aspirin EC 81 MG tablet Take 81 mg by mouth daily.    . carvedilol (COREG) 6.25 MG tablet TAKE ONE TABLET BY MOUTH TWICE A DAY WITH A MEAL 180 tablet 3  . diphenoxylate-atropine (LOMOTIL) 2.5-0.025 MG tablet May take 1-2 tabs four times a day as needed for loose stools. 45 tablet 0  . fentaNYL (DURAGESIC - DOSED MCG/HR) 50 MCG/HR Place 1 patch (50 mcg total) onto the skin every 3 (three) days. 10 patch 0  . HYDROcodone-acetaminophen (NORCO) 10-325 MG tablet Take 1 tablet by mouth every 6 (six) hours as needed. (Patient taking differently: Take 1 tablet by mouth every 6 (six) hours as needed for moderate pain. ) 90 tablet 0  . pravastatin (PRAVACHOL) 40 MG tablet TAKE ONE (1) TABLET BY MOUTH EVERY DAY (Patient taking differently: TAKE ONE (1) TABLET BY MOUTH AT BEDTIME) 90 tablet 6  . triamcinolone cream (KENALOG) 0.1 % Apply 1 application topically 2 (two) times daily. 80 g 1  . ondansetron (ZOFRAN) 8 MG tablet Take 1 tablet  (8 mg total) by mouth every 8 (eight) hours as needed for nausea or vomiting. (Patient not taking: Reported on 04/13/2015) 30 tablet 2  . prochlorperazine (COMPAZINE) 10 MG tablet Take 1 tablet (10 mg total) by mouth every 6 (six) hours as needed for nausea or vomiting. (Patient not taking: Reported on 03/28/2015) 30 tablet 2   No current facility-administered medications on file prior to visit.     REVIEW OF SYSTEMS: Cardiovascular: No chest pain, chest pressure, palpitations, orthopnea, or dyspnea on exertion. No claudication or rest pain,  No history of DVT or phlebitis. Pulmonary: No productive cough, asthma or wheezing. Neurologic:  No weakness, paresthesias, aphasia, or amaurosis. No dizziness. Hematologic: No bleeding problems or clotting disorders. Musculoskeletal: No joint pain or joint swelling. Gastrointestinal: No blood in stool or hematemesis Genitourinary: No dysuria or hematuria. Psychiatric:: No history of major depression. Integumentary:  Allergic allergy rash to his hand. Constitutional: No fever or chills.  PHYSICAL EXAMINATION:   Vital signs are  Filed Vitals:   05/16/15 1200  BP: 135/79  Pulse: 84  Temp: 97 F (36.1 C)  TempSrc: Oral  Height: '5\' 9"'$  (1.753 m)  Weight: 215 lb 9.6 oz (97.796 kg)  SpO2: 98%   Body mass index is 31.82 kg/(m^2). General: The patient appears their stated age. HEENT:  No gross abnormalities Pulmonary:  Non labored breathing Abdomen: Soft and non-tender Musculoskeletal: There are no major deformities. Neurologic: No focal weakness or paresthesias are detected, Skin: There are no ulcer or rashes noted. Psychiatric: The patient has normal affect. Cardiovascular: There is a regular rate and rhythm without significant murmur appreciated. Palpable pedal pulses   Diagnostic Studies   I have reviewed his CT scan with the following findings: He has a 6.2 cm infrarenal abdominal aortic aneurysm.  He has a continued good response to  therapy of the right upper lobe mass.  There is no evidence of progressive metastatic disease.  Assessment:  6.2 cm infrarenal abdominal aortic aneurysm Plan:  we discussed proceeding with endovascular repair.  I discussed the details of the  Procedure with diagrams.  We discussed the risks including but not limited to cardiopulmonary complications, intestinal ischemia, lower extremity ischemia bleeding , and renal insufficiency.  All his questions were answered.  I have placed him on the schedule for Friday, February 3  V. Leia Alf, M.D. [facility] Office: [phone] Pager:  [phone]

## 2015-05-18 ENCOUNTER — Other Ambulatory Visit (HOSPITAL_COMMUNITY): Payer: Self-pay | Admitting: Oncology

## 2015-05-26 ENCOUNTER — Other Ambulatory Visit: Payer: Self-pay

## 2015-05-26 ENCOUNTER — Telehealth: Payer: Self-pay

## 2015-05-26 NOTE — Telephone Encounter (Signed)
Opened in error

## 2015-05-31 ENCOUNTER — Encounter (HOSPITAL_COMMUNITY): Payer: Self-pay | Admitting: Lab

## 2015-05-31 ENCOUNTER — Encounter (HOSPITAL_BASED_OUTPATIENT_CLINIC_OR_DEPARTMENT_OTHER): Payer: BLUE CROSS/BLUE SHIELD

## 2015-05-31 ENCOUNTER — Encounter (HOSPITAL_COMMUNITY): Payer: Self-pay | Admitting: Hematology & Oncology

## 2015-05-31 ENCOUNTER — Encounter (HOSPITAL_COMMUNITY): Payer: BLUE CROSS/BLUE SHIELD | Attending: Hematology & Oncology | Admitting: Hematology & Oncology

## 2015-05-31 VITALS — BP 118/69 | HR 79 | Temp 97.9°F | Resp 18 | Wt 220.4 lb

## 2015-05-31 VITALS — BP 132/85 | HR 73 | Temp 97.9°F | Resp 18

## 2015-05-31 DIAGNOSIS — C3411 Malignant neoplasm of upper lobe, right bronchus or lung: Secondary | ICD-10-CM

## 2015-05-31 DIAGNOSIS — R918 Other nonspecific abnormal finding of lung field: Secondary | ICD-10-CM | POA: Insufficient documentation

## 2015-05-31 DIAGNOSIS — D6481 Anemia due to antineoplastic chemotherapy: Secondary | ICD-10-CM | POA: Diagnosis not present

## 2015-05-31 DIAGNOSIS — K869 Disease of pancreas, unspecified: Secondary | ICD-10-CM | POA: Diagnosis not present

## 2015-05-31 DIAGNOSIS — Z720 Tobacco use: Secondary | ICD-10-CM | POA: Diagnosis not present

## 2015-05-31 DIAGNOSIS — C3490 Malignant neoplasm of unspecified part of unspecified bronchus or lung: Secondary | ICD-10-CM

## 2015-05-31 DIAGNOSIS — K8689 Other specified diseases of pancreas: Secondary | ICD-10-CM

## 2015-05-31 DIAGNOSIS — C3491 Malignant neoplasm of unspecified part of right bronchus or lung: Secondary | ICD-10-CM

## 2015-05-31 DIAGNOSIS — Z0000 Encounter for general adult medical examination without abnormal findings: Secondary | ICD-10-CM | POA: Insufficient documentation

## 2015-05-31 DIAGNOSIS — Z5111 Encounter for antineoplastic chemotherapy: Secondary | ICD-10-CM | POA: Diagnosis not present

## 2015-05-31 LAB — COMPREHENSIVE METABOLIC PANEL
ALK PHOS: 65 U/L (ref 38–126)
ALT: 11 U/L — AB (ref 17–63)
AST: 16 U/L (ref 15–41)
Albumin: 4.2 g/dL (ref 3.5–5.0)
Anion gap: 9 (ref 5–15)
BUN: 11 mg/dL (ref 6–20)
CALCIUM: 9.1 mg/dL (ref 8.9–10.3)
CO2: 28 mmol/L (ref 22–32)
CREATININE: 1 mg/dL (ref 0.61–1.24)
Chloride: 99 mmol/L — ABNORMAL LOW (ref 101–111)
Glucose, Bld: 101 mg/dL — ABNORMAL HIGH (ref 65–99)
Potassium: 4 mmol/L (ref 3.5–5.1)
Sodium: 136 mmol/L (ref 135–145)
Total Bilirubin: 0.6 mg/dL (ref 0.3–1.2)
Total Protein: 7.7 g/dL (ref 6.5–8.1)

## 2015-05-31 LAB — CBC WITH DIFFERENTIAL/PLATELET
Basophils Absolute: 0 10*3/uL (ref 0.0–0.1)
Basophils Relative: 1 %
EOS PCT: 5 %
Eosinophils Absolute: 0.3 10*3/uL (ref 0.0–0.7)
HCT: 31.9 % — ABNORMAL LOW (ref 39.0–52.0)
HEMOGLOBIN: 10.8 g/dL — AB (ref 13.0–17.0)
LYMPHS ABS: 1.6 10*3/uL (ref 0.7–4.0)
LYMPHS PCT: 28 %
MCH: 32.1 pg (ref 26.0–34.0)
MCHC: 33.9 g/dL (ref 30.0–36.0)
MCV: 94.9 fL (ref 78.0–100.0)
Monocytes Absolute: 0.6 10*3/uL (ref 0.1–1.0)
Monocytes Relative: 10 %
NEUTROS PCT: 56 %
Neutro Abs: 3.1 10*3/uL (ref 1.7–7.7)
Platelets: 129 10*3/uL — ABNORMAL LOW (ref 150–400)
RBC: 3.36 MIL/uL — AB (ref 4.22–5.81)
RDW: 18 % — ABNORMAL HIGH (ref 11.5–15.5)
WBC: 5.6 10*3/uL (ref 4.0–10.5)

## 2015-05-31 MED ORDER — PEGFILGRASTIM 6 MG/0.6ML ~~LOC~~ PSKT
PREFILLED_SYRINGE | SUBCUTANEOUS | Status: AC
  Filled 2015-05-31: qty 0.6

## 2015-05-31 MED ORDER — SODIUM CHLORIDE 0.9 % IV SOLN
Freq: Once | INTRAVENOUS | Status: AC
  Administered 2015-05-31: 10:00:00 via INTRAVENOUS

## 2015-05-31 MED ORDER — PALONOSETRON HCL INJECTION 0.25 MG/5ML
INTRAVENOUS | Status: AC
  Filled 2015-05-31: qty 5

## 2015-05-31 MED ORDER — SODIUM CHLORIDE 0.9 % IV SOLN
684.0000 mg | Freq: Once | INTRAVENOUS | Status: AC
  Administered 2015-05-31: 680 mg via INTRAVENOUS
  Filled 2015-05-31: qty 68

## 2015-05-31 MED ORDER — PALONOSETRON HCL INJECTION 0.25 MG/5ML
0.2500 mg | Freq: Once | INTRAVENOUS | Status: AC
  Administered 2015-05-31: 0.25 mg via INTRAVENOUS

## 2015-05-31 MED ORDER — SODIUM CHLORIDE 0.9 % IJ SOLN: 10.0000 mL | INTRAMUSCULAR | Status: DC | PRN

## 2015-05-31 MED ORDER — PEGFILGRASTIM 6 MG/0.6ML ~~LOC~~ PSKT
6.0000 mg | PREFILLED_SYRINGE | Freq: Once | SUBCUTANEOUS | Status: AC
  Administered 2015-05-31: 6 mg via SUBCUTANEOUS

## 2015-05-31 MED ORDER — ATROPINE SULFATE 1 MG/ML IJ SOLN
0.5000 mg | Freq: Once | INTRAMUSCULAR | Status: AC
  Administered 2015-05-31: 0.5 mg via INTRAVENOUS

## 2015-05-31 MED ORDER — ATROPINE SULFATE 1 MG/ML IJ SOLN
INTRAMUSCULAR | Status: AC
  Filled 2015-05-31: qty 1

## 2015-05-31 MED ORDER — IRINOTECAN HCL CHEMO INJECTION 100 MG/5ML
165.0000 mg/m2 | Freq: Once | INTRAVENOUS | Status: AC
  Administered 2015-05-31: 354 mg via INTRAVENOUS
  Filled 2015-05-31: qty 5

## 2015-05-31 MED ORDER — SODIUM CHLORIDE 0.9 % IV SOLN
Freq: Once | INTRAVENOUS | Status: AC
  Administered 2015-05-31: 11:00:00 via INTRAVENOUS
  Filled 2015-05-31: qty 5

## 2015-05-31 NOTE — Progress Notes (Signed)
Referral sent to [facility]. Records faxed on 2/7

## 2015-05-31 NOTE — Patient Instructions (Addendum)
[place] at [facility] Discharge Instructions  RECOMMENDATIONS MADE BY THE CONSULTANT AND ANY TEST RESULTS WILL BE SENT TO YOUR REFERRING PHYSICIAN.   Exam and discussion by Dr Whitney Muse today Today is your last treatment!!! Referral to [facility].  They will call you with this appt.  We are going to schedule CT scans. Return to see the doctor after your scans. Please call the clinic if you have any questions or concerns    Thank you for choosing [place] at [facility] to provide your oncology and hematology care.  To afford each patient quality time with our provider, please arrive at least 15 minutes before your scheduled appointment time.   Beginning January 23rd 2017 lab work for the [facility] will be done in the  Main lab at [facility] on 1st floor. If you have a lab appointment with the [facility] please come in thru the  Main Entrance and check in at the main information desk  You need to re-schedule your appointment should you arrive 10 or more minutes late.  We strive to give you quality time with our providers, and arriving late affects you and other patients whose appointments are after yours.  Also, if you no show three or more times for appointments you may be dismissed from the clinic at the providers discretion.     Again, thank you for choosing [facility].  Our hope is that these requests will decrease the amount of time that you wait before being seen by our physicians.       _____________________________________________________________  Should you have questions after your visit to [facility], please contact our office at [phone] between the hours of 8:30 a.m. and 4:30 p.m.  Voicemails left after 4:30 p.m. will not be returned until the following business day.  For prescription refill requests, have your pharmacy contact our office.

## 2015-05-31 NOTE — Progress Notes (Signed)
Patient tolerated infusion well.  VSS.  Neulasta OnPro applied to right upper arm.  Patient aware of when and how to remove.

## 2015-05-31 NOTE — Patient Instructions (Signed)
[facility] Discharge Instructions for Patients Receiving Chemotherapy   Beginning January 23rd 2017 lab work for the [facility] will be done in the  Main lab at [facility] on 1st floor. If you have a lab appointment with the [facility] please come in thru the  Main Entrance and check in at the main information desk   Today you received the following chemotherapy agents: Carboplatin and Camptosar.     If you develop nausea and vomiting, or diarrhea that is not controlled by your medication, call the clinic.  The clinic phone number is [phone]. Office hours are Monday-Friday 8:30am-5:00pm.  BELOW ARE SYMPTOMS THAT SHOULD BE REPORTED IMMEDIATELY:  *FEVER GREATER THAN 101.0 F  *CHILLS WITH OR WITHOUT FEVER  NAUSEA AND VOMITING THAT IS NOT CONTROLLED WITH YOUR NAUSEA MEDICATION  *UNUSUAL SHORTNESS OF BREATH  *UNUSUAL BRUISING OR BLEEDING  TENDERNESS IN MOUTH AND THROAT WITH OR WITHOUT PRESENCE OF ULCERS  *URINARY PROBLEMS  *BOWEL PROBLEMS  UNUSUAL RASH Items with * indicate a potential emergency and should be followed up as soon as possible. If you have an emergency after office hours please contact your primary care physician or go to the nearest emergency department.  Please call the clinic during office hours if you have any questions or concerns.   You may also contact the Patient Navigator at [phone] should you have any questions or need assistance in obtaining follow up care.

## 2015-05-31 NOTE — Progress Notes (Signed)
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    01/18/2015 Imaging CT chest- 8.7 x 8.7 cm R hilar mass encasing the R pulm artery and occluding the R upper lobe bronchus. Sm R pleural effusion; malignant or reactive. 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 34 mm x 26 mm mass in body of pancreas   01/21/2015 Imaging MRI brain- No acute intracranial findings. No signs of metastatic disease. Minor white matter disease, stable and nonspecific.   01/26/2015 Pathology Results Lung, biopsy, right upper lobe - SMALL CELL CARCINOMA.   01/26/2015 Pathology Results Diagnosis FINE NEEDLE ASPIRATION: ENDOSCOPIC SPECIMEN A, EBUS LEVEL 7 NODE (SPECIMEN 1 OF 3 COLLECTED 01/26/2015) MALIGNANT CELLS PRESENT, CONSISTENT WITH SMALL CELL CARCINOMA.   01/26/2015 Pathology Results WANG NEEDLE ASPIRATION, SPECIMEN B LEVEL 7 NODE (SPECIMEN 2 OF 3 COLLECTED 01/26/2015) MALIGNANT CELLS PRESENT, CONSISTENT WITH SMALL CELL CARCINOMA.   01/26/2015  Pathology Results Diagnosis BRONCHIAL BRUSHING SPECIMEN C, RIGHT UPPER LOBE (SPECIMEN 3 OF 3 COLLECTED 01/26/2015) ATYPICAL CELLS PRESENT.   01/27/2015 PET scan Large hypermetabolic right hilar mass with mediastinal invasion and infrahilar extension, maximum standard uptake value 13.0.  Pancreatic tail mass, approximately 4.5 cm in diameter. Abdominal aortic aneurysm, 5.8 cm in diameter.   02/10/2015 Pathology Results Diagnosis FINE NEEDLE ASPIRATION, ENDOSCOPIC, PANCREAS TAIL (SPECIMEN 1 OF 1 COLLECTED 02/10/15): MALIGNANT CELLS CONSISTENT WITH METASTATIC SMALL CELL CARCINOMA.   02/14/2015 - 03/06/2015 Chemotherapy Cisplatin/Etoposide   03/06/2015 Treatment Plan Change Change systemic therapy to Carboplatin based   03/07/2015 - 05/10/2015 Chemotherapy Carboplatin/Etoposide.   04/13/2015 Imaging resolution of pancreatic metastases, decrease in size of R perihilar mss and RUL mass. Interval response to therapy   05/10/2015 -  Chemotherapy Change to carboplatin/irinotecan for cycle #5 and #6 secondary to significant skin toxicity presumably from etoposide     HISTORY OF PRESENTING ILLNESS:  [name].o. male is here for further up of extensive stage SCLC.   [name] here alone today. He is active.   [name]. He says he is feeling pretty good today.  He says that the last treatment he received he got some stomach cramps, but that was it. He confirms that he's never had any issues with diarrhea, including at present.  His hands are looking much better today and he remarks that his peeling is gone. He confirms that his breathing is okay, and that he is eating well; he says that he recently gained two pounds.  He denies any  headaches or blurry vision.   [name] confirms that his breathing is okay, reports no extreme coughing, confirms that he sleeps good at night, and denies any chest pain.  On March the 3rd  he will have an aneurysm repair.    MEDICAL HISTORY:  Past Medical History  Diagnosis Date  . Coronary artery disease 2002    Lesion in LAD s/p angioplasty; s/p CABG x 4 Sept 2013  . Tobacco abuse   . Myocardial infarct [facility]) [id number]    stents  . Hyperlipidemia 1999  . Essential hypertension, benign 1999  . Lung mass   . COPD (chronic obstructive pulmonary disease) ([place])   . Shortness of breath dyspnea     with exertion, sleeps with more pillows under head  . Arthritis   . Pancreatitis   . Complication of anesthesia     woke up while being extubated and remembers fighting  . Cancer ([place])     lung  . Mass of pancreas     SURGICAL HISTORY: Past Surgical History  Procedure Laterality Date  . Knee surgery      left x 2  ACL repair  . Tee without cardioversion  01/02/2012    Procedure: TRANSESOPHAGEAL ECHOCARDIOGRAM (TEE);  Surgeon: [name], MD;  Location: [facility];  Service: Open Heart Surgery;  Laterality: N/A;  . Multiple extractions with alveoloplasty  02/21/2012    Procedure: MULTIPLE EXTRACION WITH ALVEOLOPLASTY;  Surgeon: Lenn Cal, DDS;  Location[facility];  Service: Oral Surgery;  Laterality: N/A;  Extaction of tooth #'s 4,5,6,7,10,11,12,13,14,15,19,20,21,22,23,24,25,   . Left heart catheterization with coronary angiogram N/A 01/01/2012    Procedure: LEFT HEART CATHETERIZATION WITH CORONARY ANGIOGRAM;  Surgeon: Hillary Bow, MD;  [facility] CATH LAB;  Service: Cardiovascular;  Laterality: N/A;  . Coronary artery bypass graft  01/02/2012    Procedure: CORONARY ARTERY BYPASS GRAFTING (CABG);  Surgeon: [name], MD;  Location: [place];  Service: Open Heart Surgery;  Laterality: N/A;  times three using left internal mammary artery and right endoscopically harvested saphenous vein  . Coronary angioplasty  1999, 2002  . Video bronchoscopy with endobronchial ultrasound N/A 01/26/2015    Procedure: VIDEO BRONCHOSCOPY WITH ENDOBRONCHIAL ULTRASOUND;   Surgeon: [name], MD;  Location: [place];  Service: Thoracic;  Laterality: N/A;  . Lung biopsy N/A 01/26/2015    Procedure: LUNG BIOPSY;  Surgeon: [name], MD;  Location: [place];  Service: Thoracic;  Laterality: N/A;  . Lymph node biopsy N/A 01/26/2015    Procedure: LYMPH NODE BIOPSY;  Surgeon: [name], MD;  Location: [place];  Service: Thoracic;  Laterality: N/A;  . Portacath placement Left 02/01/2015    Procedure: ATTEMPTED INSERTION OF PORT-A-CATH;  Surgeon: [name], MD;  Location: [place];  Service: Thoracic;  Laterality: Left;  . Eus N/A 02/10/2015    Procedure: UPPER ENDOSCOPIC ULTRASOUND (EUS) LINEAR;  Surgeon: [name], MD;  [facility];  Service: Endoscopy;  Laterality: N/A;    SOCIAL HISTORY: Social History   Social History  . Marital Status: Married    Spouse Name: N/A  . Number of Children: 5  . Years of Education: N/A   Occupational History  . Works in [employer]  . Smoking status: Current Every Day Smoker -- 0.50 packs/day for 35 years    Types: Cigarettes    Start date: 06/30/1976  . Smokeless tobacco: Never Used  . Alcohol  Use: 0.0 oz/week    0 Standard drinks or equivalent per week     Comment: maybe a 12 pack/mth if that - very rare since 2013  . Drug Use: No  . Sexual Activity: Yes   Other Topics Concern  . Not on file   Social History Narrative   Delene Loll been married 17 years, with five kids.  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 Cancer Neg Hx   . Heart disease Father   . Diabetes Father   . Hyperlipidemia Father   .  Hypertension Father   . Hyperlipidemia Mother   . Hypertension Mother   . Stroke Mother   . Diabetes Paternal Grandmother    indicated that his mother is alive. He indicated that his father is alive. He indicated that his sister is alive. He indicated that his brother is alive. He indicated that his maternal grandmother is deceased. He indicated that his maternal grandfather is deceased. He indicated that his paternal grandmother is deceased. He indicated that his paternal grandfather is deceased.   His father's somewhere in [place]; his mother lives here in [place] and works at [employer] 76 at the end of this month She's got high blood pressure, high cholesterol, no strokes that they know of; is on blood thinners Has an aneurism at the base of the aorta where it goes into your stomach He knows his father's had high blood pressure and diabetes; Parents have been divorced since he was 3-4 Has half brothers / half sisters   ALLERGIES:  is allergic to lisinopril and tea.  MEDICATIONS:  Current Outpatient Prescriptions  Medication Sig Dispense Refill  . amLODipine (NORVASC) 5 MG tablet TAKE ONE (1) TABLET BY MOUTH EVERY DAY (Patient taking differently: TAKE ONE (1) TABLET BY MOUTH AT BEDTIME) 90 tablet 6  . aspirin EC 81 MG tablet Take 81 mg by mouth daily.    . carvedilol (COREG) 6.25 MG tablet TAKE ONE TABLET BY MOUTH TWICE A DAY WITH A MEAL 180 tablet 3  . pravastatin (PRAVACHOL) 40 MG tablet TAKE ONE (1) TABLET BY MOUTH EVERY DAY (Patient taking differently: TAKE ONE (1) TABLET BY MOUTH AT BEDTIME) 90 tablet 6  . triamcinolone cream (KENALOG) 0.1 % Apply 1 application topically 2 (two) times daily. 80 g 1  . diphenoxylate-atropine (LOMOTIL) 2.5-0.025 MG tablet May take 1-2 tabs four times a day as needed for loose stools. (Patient not taking: Reported on 05/31/2015) 45 tablet 0  . fentaNYL (DURAGESIC - DOSED MCG/HR) 50 MCG/HR Place 1 patch (50 mcg total) onto the skin every 3  (three) days. (Patient not taking: Reported on 05/31/2015) 10 patch 0  . HYDROcodone-acetaminophen (NORCO) 10-325 MG tablet Take 1 tablet by mouth every 6 (six) hours as needed. (Patient taking differently: Take 1 tablet by mouth every 6 (six) hours as needed for moderate pain. ) 90 tablet 0  . ondansetron (ZOFRAN) 8 MG tablet Take 1 tablet (8 mg total) by mouth every 8 (eight) hours as needed for nausea or vomiting. (Patient not taking: Reported on 04/13/2015) 30 tablet 2  .  prochlorperazine (COMPAZINE) 10 MG tablet Take 1 tablet (10 mg total) by mouth every 6 (six) hours as needed for nausea or vomiting. (Patient not taking: Reported on 03/28/2015) 30 tablet 2   No current facility-administered medications for this visit.   Facility-Administered Medications Ordered in Other Visits  Medication Dose Route Frequency Provider Last Rate Last Dose  . 0.9 %  sodium chloride infusion   Intravenous Once [name], MD      . CARBOplatin (PARAPLATIN) 680 mg in sodium chloride 0.9 % 250 mL chemo infusion  680 mg Intravenous Once [name], MD      . fosaprepitant (EMEND) 150 mg, dexamethasone (DECADRON) 10 mg in sodium chloride 0.9 % 145 mL IVPB   Intravenous Once [name], MD      . irinotecan (CAMPTOSAR) 354 mg in dextrose 5 % 500 mL chemo infusion  165 mg/m2 (Treatment Plan Actual) Intravenous Once [name], MD      . palonosetron (ALOXI) injection 0.25 mg  0.25 mg Intravenous Once [name], MD      . pegfilgrastim (NEULASTA ONPRO KIT) injection 6 mg  6 mg Subcutaneous Once [name], MD      . sodium chloride 0.9 % injection 10 mL  10 mL Intracatheter PRN [name], MD        Review of Systems  Constitutional: Negative for fever, chills, weight loss and malaise/fatigue.  HENT: Negative for congestion, hearing loss, nosebleeds, sore throat and tinnitus.   Eyes: Negative for blurred vision, double vision, pain and discharge.  Respiratory: Negative  for hemoptysis, cough, shortness of breath and wheezing.   Cardiovascular: Negative for palpitations, claudication, leg swelling and PND.  Gastrointestinal: Negative for heartburn, nausea, vomiting, abdominal pain, diarrhea, constipation, blood in stool and melena.  Genitourinary: Negative for dysuria, urgency, frequency and hematuria.  Musculoskeletal: Positive for joint pain. Negative for myalgias and falls.       Has had knee surgeries.  Skin:  Negative for itching. Neurological: Negative for dizziness, tingling, tremors, sensory change, speech change, focal weakness, seizures, loss of consciousness, weakness and headaches.  Endo/Heme/Allergies: Does not bruise/bleed easily.  Psychiatric/Behavioral: Negative for depression, suicidal ideas, memory loss and substance abuse. The patient is not nervous/anxious and does not have insomnia.   All other systems reviewed and are negative.  14 point ROS was done and is otherwise as detailed above or in HPI    PHYSICAL EXAMINATION: ECOG PERFORMANCE STATUS: 0 - Asymptomatic   Vitals with BMI 05/10/2015  Height   Weight 217 lbs 2 oz  BMI   Systolic 546  Diastolic 91  Pulse 81  Respirations 20   Physical Exam  Constitutional: He is oriented to person, place, and time and well-developed, well-nourished, and in no distress. Alopecia  HENT:  Head: Normocephalic and atraumatic.  Nose: Nose normal.  Mouth/Throat: Oropharynx is clear and moist. No oropharyngeal exudate.  Dentures on top and bottom.  Eyes: Conjunctivae and EOM are normal. Pupils are equal, round, and reactive to light. Right eye exhibits no discharge. Left eye exhibits no discharge. No scleral icterus.  Neck: Normal range of motion. Neck supple. No tracheal deviation present. No thyromegaly present.  Cardiovascular: Normal rate, regular rhythm and normal heart sounds.  Exam reveals no gallop and no friction rub.   No murmur heard. Pulmonary/Chest: Effort normal. He has no  wheezes. He has no rales.  Abdominal: Soft. Bowel sounds are normal. He exhibits no distension and no mass.  There is no tenderness. There is no rebound and no guarding.  Musculoskeletal: Normal range of motion. He exhibits no edema.  Lymphadenopathy:    He has no cervical adenopathy.  Neurological: He is alert and oriented to person, place, and time. He has normal reflexes. No cranial nerve deficit. Gait normal. Coordination normal.  Skin: Skin is warm and dry. Rash resolved Psychiatric: Mood, memory, affect and judgment normal.  Nursing note and vitals reviewed.   LABORATORY DATA:  I have reviewed the data as listed.  CBC    Component Value Date/Time   WBC 5.6 05/31/2015 0936   RBC 3.36* 05/31/2015 0936   HGB 10.8* 05/31/2015 0936   HCT 31.9* 05/31/2015 0936   PLT 129* 05/31/2015 0936   MCV 94.9 05/31/2015 0936   MCH 32.1 05/31/2015 0936   MCHC 33.9 05/31/2015 0936   RDW 18.0* 05/31/2015 0936   LYMPHSABS 1.6 05/31/2015 0936   MONOABS 0.6 05/31/2015 0936   EOSABS 0.3 05/31/2015 0936   BASOSABS 0.0 05/31/2015 0936   CMP     Component Value Date/Time   NA 136 05/31/2015 0936   K 4.0 05/31/2015 0936   CL 99* 05/31/2015 0936   CO2 28 05/31/2015 0936   GLUCOSE 101* 05/31/2015 0936   BUN 11 05/31/2015 0936   CREATININE 1.00 05/31/2015 0936   CREATININE 0.99 01/27/2015 1450   CALCIUM 9.1 05/31/2015 0936   PROT 7.7 05/31/2015 0936   ALBUMIN 4.2 05/31/2015 0936   AST 16 05/31/2015 0936   ALT 11* 05/31/2015 0936   ALKPHOS 65 05/31/2015 0936   BILITOT 0.6 05/31/2015 0936   GFRNONAA >60 05/31/2015 0936   GFRNONAA 87 01/27/2015 1450   GFRAA >60 05/31/2015 0936   GFRAA >89 01/27/2015 1450    RADIOGRAPHIC STUDIES: I have personally reviewed the radiological images as listed and agreed with the findings in the report.  Study Result     CLINICAL DATA: Stage IV small cell lung cancer.  EXAM: CT CHEST AND ABDOMEN WITH CONTRAST  TECHNIQUE: Multidetector CT imaging of  the chest and abdomen was performed following the standard protocol during bolus administration of intravenous contrast.  CONTRAST: 129m OMNIPAQUE IOHEXOL 300 MG/ML SOLN  COMPARISON: 01/27/2015  FINDINGS: CT CHEST  Mediastinum: Normal heart size. Previous median sternotomy and CABG procedure. No pericardial effusion. Aortic atherosclerosis noted. No enlarged supraclavicular or axillary lymph nodes. The central right perihilar mass invading the mediastinum has decreased in size. This measures 5.6 x 2.1 cm, image 26 of series 2. Previous this measured 8.9 x 9.3 cm.  Lungs/Pleura: No pleural fluid. Moderate changes of paraseptal and centrilobular emphysema identified. The right upper lobe lung mass measures 0.5 x 1.0 cm, image 16 of series 6. This is compared with 1.8 x 2.2 cm previously. Residual scar like densities are noted within the right upper lobe, image number 30 of series 6 and image number 20 of series 6. There are 2 tiny peripheral nodules within the right upper lobe which measure 3 mm.  Musculoskeletal: There is no aggressive lytic or sclerotic bone lesions identified.  CT ABDOMEN  Hepatobiliary: No suspicious liver abnormalities identified. The gallbladder appears normal. There is no biliary dilatation.  Pancreas: Resolution of previous lesion involving tail of pancreas  Spleen: The spleen is unremarkable.  Adrenals/Urinary Tract: The adrenal glands appear normal. Unremarkable appearance of the kidneys.  Stomach/Bowel: The stomach is normal. There is no pathologic dilatation of the upper abdominal bowel loops.  Vascular/Lymphatic: Aortic atherosclerosis is noted. Infrarenal abdominal aortic aneurysm  measures 6.1 cm, image 82 of series 2.  Other: No free fluid or fluid collections within the upper abdomen.  Musculoskeletal: Degenerative disc disease noted within the thoracic and lumbar spine.  IMPRESSION: 1. Interval response to  therapy. 2. Decrease in size of large right perihilar lung mass invading the mediastinum. The right upper lobe lung lesion has also decreased in size in the interval. 3. Resolution of pancreas metastasis. 4. Infrarenal abdominal aortic aneurysm. Vascular surgery consultation recommended due to increased risk of rupture for AAA >5.5 cm. This recommendation follows ACR consensus guidelines: White Paper of the ACR Incidental Findings Committee II on Vascular Findings. J Am Coll Radiol 2013; 10:789-794.   Electronically Signed  By: Kerby Moors M.D.  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 Large hypermetabolic right hilar mass with mediastinal invasion and infrahilar extension  Hyponatremia c/w SIADH  EUS with Dr. JaArdis Hughsnd pancreatic biopsy on 02/10/2015 c/w SCLC Anemia secondary to chemotherapy CT C/A/P 04/13/2015 with interval response to therapy. Presumed drug reaction to etoposide Treatment related anemia   Overall he has done well. His skin toxicity was clearly from etoposide. He was changed to Irinotecan and has done well. He will complete 6 cycles of therapy today. He has had an excellent response. PS is a 0.  I will set him up for CT imaging, and will refer him to radiation oncology. [facility]  I will see him back after his scans and we will look over them together.  He says he does not need refills on anything  at the time, remarking that he's hardly used his pain medicine and hasn't used any in 2 weeks.  Orders Placed This Encounter  Procedures  . CT Abdomen Pelvis W Contrast    Standing Status: Future     Number of Occurrences:      Standing Expiration Date: 05/30/2016    Order Specific Question:  If indicated for the ordered procedure, I authorize the administration of contrast media per Radiology protocol    Answer:  Yes    Order Specific Question:  Reason for Exam (SYMPTOM  OR DIAGNOSIS REQUIRED)    Answer:  SCLC, extensive stage    Order Specific Question:  [facility]    Standing Status: Future     Number of Occurrences:      Standing Expiration Date: 05/30/2016    Order Specific Question:  If indicated for the ordered procedure, I authorize the administration of contrast media per Radiology protocol    Answer:  Yes    Order Specific Question:  Reason for Exam (SYMPTOM  OR DIAGNOSIS REQUIRED)    Answer:  SCLC, extensive stage    Order Specific Question:  [facility]    [facility] All questions were answered. The patient knows to call the  clinic with any problems, questions or concerns.  This document serves as a record of services personally performed by Ancil Linsey, MD. It was created on her behalf by Toni Amend, a trained medical scribe. The creation of this record is based on the scribe's personal observations and the provider's statements to them. This document has been checked and approved by the attending provider.  I have reviewed the above documentation for accuracy and completeness, and I agree with the above.  This note was electronically signed.    Kelby Fam. Evertt Chouinard, MD   05/31/2015 10:37 AM

## 2015-06-08 ENCOUNTER — Other Ambulatory Visit (HOSPITAL_COMMUNITY): Payer: Self-pay | Admitting: Emergency Medicine

## 2015-06-14 ENCOUNTER — Encounter: Payer: Self-pay | Admitting: Radiation Oncology

## 2015-06-16 ENCOUNTER — Ambulatory Visit (HOSPITAL_COMMUNITY): Payer: BLUE CROSS/BLUE SHIELD | Admitting: Hematology & Oncology

## 2015-06-16 ENCOUNTER — Other Ambulatory Visit (HOSPITAL_COMMUNITY): Payer: BLUE CROSS/BLUE SHIELD

## 2015-06-21 ENCOUNTER — Ambulatory Visit (HOSPITAL_COMMUNITY)
Admission: RE | Admit: 2015-06-21 | Discharge: 2015-06-21 | Disposition: A | Discharge status: Home / Self Care | Payer: BLUE CROSS/BLUE SHIELD | Source: Ambulatory Visit | Attending: Hematology & Oncology | Admitting: Hematology & Oncology

## 2015-06-21 DIAGNOSIS — C3491 Malignant neoplasm of unspecified part of right bronchus or lung: Secondary | ICD-10-CM | POA: Diagnosis present

## 2015-06-21 DIAGNOSIS — I719 Aortic aneurysm of unspecified site, without rupture: Secondary | ICD-10-CM | POA: Insufficient documentation

## 2015-06-21 MED ORDER — IOHEXOL 300 MG/ML  SOLN
100.0000 mL | Freq: Once | INTRAMUSCULAR | Status: AC | PRN
Start: 2015-06-21 — End: 2015-06-21
  Administered 2015-06-21: 100 mL via INTRAVENOUS

## 2015-06-21 NOTE — Pre-Procedure Instructions (Signed)
Mohammed Mcandrew  06/21/2015     Your procedure is scheduled on : Friday June 24, 2015 at 7:30 AM.  Report to [facility] Admitting at 5:30 AM.  Call this number if you have problems the morning of surgery: [phone]    Remember:  Do not eat food or drink liquids after midnight.  Take these medicines the morning of surgery with A SIP OF WATER : Amlodipine (Norvasc), Carvedilol (Coreg)   Stop taking any vitamins, herbal medications/supplements, Ibuprofen, Advil, Motrin, Aleve, etc   Do not wear jewelry.  Do not wear lotions, powders, or cologne.    Men may shave face and neck.  Do not bring valuables to the hospital.  [facility] is not responsible for any belongings or valuables.  Contacts, dentures or bridgework may not be worn into surgery.  Leave your suitcase in the car.  After surgery it may be brought to your room.  For patients admitted to the hospital, discharge time will be determined by your treatment team.  Patients discharged the day of surgery will not be allowed to drive home.   Name and phone number of your driver:    Special instructions:  Shower using CHG soap the night before and the morning of your surgery  Please read over the following fact sheets that you were given. Pain Booklet, Coughing and Deep Breathing, Blood Transfusion Information, MRSA Information and Surgical Site Infection Prevention

## 2015-06-22 ENCOUNTER — Encounter (HOSPITAL_COMMUNITY): Payer: Self-pay

## 2015-06-22 ENCOUNTER — Encounter (HOSPITAL_COMMUNITY)
Admission: RE | Admit: 2015-06-22 | Discharge: 2015-06-22 | Disposition: A | Discharge status: Home / Self Care | Payer: BLUE CROSS/BLUE SHIELD | Source: Ambulatory Visit | Attending: Surgery | Admitting: Surgery

## 2015-06-22 ENCOUNTER — Encounter (HOSPITAL_COMMUNITY): Payer: BLUE CROSS/BLUE SHIELD | Attending: Hematology & Oncology | Admitting: Hematology & Oncology

## 2015-06-22 ENCOUNTER — Encounter (HOSPITAL_COMMUNITY): Payer: Self-pay | Admitting: Hematology & Oncology

## 2015-06-22 VITALS — BP 142/81 | HR 79 | Temp 97.9°F | Resp 20 | Wt 224.0 lb

## 2015-06-22 DIAGNOSIS — Z01812 Encounter for preprocedural laboratory examination: Secondary | ICD-10-CM | POA: Diagnosis not present

## 2015-06-22 DIAGNOSIS — C7889 Secondary malignant neoplasm of other digestive organs: Secondary | ICD-10-CM | POA: Insufficient documentation

## 2015-06-22 DIAGNOSIS — Z01818 Encounter for other preprocedural examination: Secondary | ICD-10-CM | POA: Diagnosis present

## 2015-06-22 DIAGNOSIS — I714 Abdominal aortic aneurysm, without rupture: Secondary | ICD-10-CM | POA: Diagnosis not present

## 2015-06-22 DIAGNOSIS — C3490 Malignant neoplasm of unspecified part of unspecified bronchus or lung: Secondary | ICD-10-CM | POA: Diagnosis not present

## 2015-06-22 DIAGNOSIS — Z951 Presence of aortocoronary bypass graft: Secondary | ICD-10-CM | POA: Insufficient documentation

## 2015-06-22 DIAGNOSIS — J449 Chronic obstructive pulmonary disease, unspecified: Secondary | ICD-10-CM | POA: Diagnosis not present

## 2015-06-22 DIAGNOSIS — R918 Other nonspecific abnormal finding of lung field: Secondary | ICD-10-CM | POA: Insufficient documentation

## 2015-06-22 DIAGNOSIS — C3491 Malignant neoplasm of unspecified part of right bronchus or lung: Secondary | ICD-10-CM | POA: Insufficient documentation

## 2015-06-22 DIAGNOSIS — Z0000 Encounter for general adult medical examination without abnormal findings: Secondary | ICD-10-CM | POA: Insufficient documentation

## 2015-06-22 DIAGNOSIS — Z79899 Other long term (current) drug therapy: Secondary | ICD-10-CM | POA: Diagnosis not present

## 2015-06-22 DIAGNOSIS — Z955 Presence of coronary angioplasty implant and graft: Secondary | ICD-10-CM | POA: Diagnosis not present

## 2015-06-22 DIAGNOSIS — Z0183 Encounter for blood typing: Secondary | ICD-10-CM | POA: Insufficient documentation

## 2015-06-22 DIAGNOSIS — K8689 Other specified diseases of pancreas: Secondary | ICD-10-CM

## 2015-06-22 DIAGNOSIS — I2510 Atherosclerotic heart disease of native coronary artery without angina pectoris: Secondary | ICD-10-CM | POA: Diagnosis not present

## 2015-06-22 DIAGNOSIS — K869 Disease of pancreas, unspecified: Secondary | ICD-10-CM

## 2015-06-22 DIAGNOSIS — C3411 Malignant neoplasm of upper lobe, right bronchus or lung: Secondary | ICD-10-CM | POA: Diagnosis not present

## 2015-06-22 DIAGNOSIS — I10 Essential (primary) hypertension: Secondary | ICD-10-CM | POA: Insufficient documentation

## 2015-06-22 DIAGNOSIS — Z7982 Long term (current) use of aspirin: Secondary | ICD-10-CM | POA: Insufficient documentation

## 2015-06-22 DIAGNOSIS — Z720 Tobacco use: Secondary | ICD-10-CM

## 2015-06-22 DIAGNOSIS — D6481 Anemia due to antineoplastic chemotherapy: Secondary | ICD-10-CM | POA: Diagnosis not present

## 2015-06-22 DIAGNOSIS — E785 Hyperlipidemia, unspecified: Secondary | ICD-10-CM | POA: Insufficient documentation

## 2015-06-22 DIAGNOSIS — I252 Old myocardial infarction: Secondary | ICD-10-CM | POA: Diagnosis not present

## 2015-06-22 DIAGNOSIS — E871 Hypo-osmolality and hyponatremia: Secondary | ICD-10-CM

## 2015-06-22 DIAGNOSIS — T50905A Adverse effect of unspecified drugs, medicaments and biological substances, initial encounter: Secondary | ICD-10-CM

## 2015-06-22 HISTORY — DX: Abdominal aortic aneurysm, without rupture: I71.4

## 2015-06-22 HISTORY — DX: Abdominal aortic aneurysm, without rupture, unspecified: I71.40

## 2015-06-22 LAB — SURGICAL PCR SCREEN
MRSA, PCR: NEGATIVE
STAPHYLOCOCCUS AUREUS: POSITIVE — AB

## 2015-06-22 LAB — URINALYSIS, ROUTINE W REFLEX MICROSCOPIC
BILIRUBIN URINE: NEGATIVE
Glucose, UA: NEGATIVE mg/dL
HGB URINE DIPSTICK: NEGATIVE
Ketones, ur: NEGATIVE mg/dL
Leukocytes, UA: NEGATIVE
NITRITE: NEGATIVE
PH: 5.5 (ref 5.0–8.0)
Protein, ur: NEGATIVE mg/dL
SPECIFIC GRAVITY, URINE: 1.009 (ref 1.005–1.030)

## 2015-06-22 LAB — COMPREHENSIVE METABOLIC PANEL
ALT: 13 U/L — ABNORMAL LOW (ref 17–63)
ANION GAP: 11 (ref 5–15)
AST: 21 U/L (ref 15–41)
Albumin: 3.9 g/dL (ref 3.5–5.0)
Alkaline Phosphatase: 62 U/L (ref 38–126)
BILIRUBIN TOTAL: 0.4 mg/dL (ref 0.3–1.2)
BUN: 12 mg/dL (ref 6–20)
CO2: 24 mmol/L (ref 22–32)
Calcium: 9.3 mg/dL (ref 8.9–10.3)
Chloride: 101 mmol/L (ref 101–111)
Creatinine, Ser: 1.07 mg/dL (ref 0.61–1.24)
Glucose, Bld: 125 mg/dL — ABNORMAL HIGH (ref 65–99)
POTASSIUM: 3.7 mmol/L (ref 3.5–5.1)
Sodium: 136 mmol/L (ref 135–145)
TOTAL PROTEIN: 7.5 g/dL (ref 6.5–8.1)

## 2015-06-22 LAB — BLOOD GAS, ARTERIAL
Acid-Base Excess: 3.3 mmol/L — ABNORMAL HIGH (ref 0.0–2.0)
BICARBONATE: 27.1 meq/L — AB (ref 20.0–24.0)
Drawn by: 449841
FIO2: 0.21
O2 SAT: 98.8 %
PH ART: 7.449 (ref 7.350–7.450)
PO2 ART: 110 mmHg — AB (ref 80.0–100.0)
Patient temperature: 98.6
TCO2: 28.3 mmol/L (ref 0–100)
pCO2 arterial: 39.6 mmHg (ref 35.0–45.0)

## 2015-06-22 LAB — CBC
HEMATOCRIT: 32.1 % — AB (ref 39.0–52.0)
Hemoglobin: 10.6 g/dL — ABNORMAL LOW (ref 13.0–17.0)
MCH: 31.5 pg (ref 26.0–34.0)
MCHC: 33 g/dL (ref 30.0–36.0)
MCV: 95.5 fL (ref 78.0–100.0)
Platelets: 144 10*3/uL — ABNORMAL LOW (ref 150–400)
RBC: 3.36 MIL/uL — ABNORMAL LOW (ref 4.22–5.81)
RDW: 16.4 % — AB (ref 11.5–15.5)
WBC: 5.2 10*3/uL (ref 4.0–10.5)

## 2015-06-22 LAB — PROTIME-INR
INR: 0.91 (ref 0.00–1.49)
PROTHROMBIN TIME: 12.5 s (ref 11.6–15.2)

## 2015-06-22 LAB — TYPE AND SCREEN
ABO/RH(D): O POS
ANTIBODY SCREEN: NEGATIVE

## 2015-06-22 LAB — APTT: APTT: 32 s (ref 24–37)

## 2015-06-22 NOTE — Progress Notes (Signed)
Nurse called Mupirocin ointment into [place], and then called and left a voicemail instructing patient to pick up prescription and began ASAP, and bring ointment in DOS. Direct call back number left.

## 2015-06-22 NOTE — Progress Notes (Signed)
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    01/18/2015 Imaging CT chest- 8.7 x 8.7 cm R hilar mass encasing the R pulm artery and occluding the R upper lobe bronchus. Sm R pleural effusion; malignant or reactive. 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 34 mm x 26 mm mass in body of pancreas   01/21/2015 Imaging MRI brain- No acute intracranial findings. No signs of metastatic disease. Minor white matter disease, stable and nonspecific.   01/26/2015 Pathology Results Lung, biopsy, right upper lobe - SMALL CELL CARCINOMA.   01/26/2015 Pathology Results Diagnosis FINE NEEDLE ASPIRATION: ENDOSCOPIC SPECIMEN A, EBUS LEVEL 7 NODE (SPECIMEN 1 OF 3 COLLECTED 01/26/2015) MALIGNANT CELLS PRESENT, CONSISTENT WITH SMALL CELL CARCINOMA.   01/26/2015 Pathology Results WANG NEEDLE ASPIRATION, SPECIMEN B LEVEL 7 NODE (SPECIMEN 2 OF 3 COLLECTED 01/26/2015) MALIGNANT CELLS PRESENT, CONSISTENT WITH SMALL CELL CARCINOMA.   01/26/2015 Pathology Results Diagnosis  BRONCHIAL BRUSHING SPECIMEN C, RIGHT UPPER LOBE (SPECIMEN 3 OF 3 COLLECTED 01/26/2015) ATYPICAL CELLS PRESENT.   01/27/2015 PET scan Large hypermetabolic right hilar mass with mediastinal invasion and infrahilar extension, maximum standard uptake value 13.0.  Pancreatic tail mass, approximately 4.5 cm in diameter. Abdominal aortic aneurysm, 5.8 cm in diameter.   02/10/2015 Pathology Results Diagnosis FINE NEEDLE ASPIRATION, ENDOSCOPIC, PANCREAS TAIL (SPECIMEN 1 OF 1 COLLECTED 02/10/15): MALIGNANT CELLS CONSISTENT WITH METASTATIC SMALL CELL CARCINOMA.   02/14/2015 - 03/06/2015 Chemotherapy Cisplatin/Etoposide   03/06/2015 Treatment Plan Change Change systemic therapy to Carboplatin based   03/07/2015 - 05/10/2015 Chemotherapy Carboplatin/Etoposide.   04/13/2015 Imaging resolution of pancreatic metastases, decrease in size of R perihilar mss and RUL mass. Interval response to therapy   05/10/2015 -  Chemotherapy Change to carboplatin/irinotecan for cycle #5 and #6 secondary to significant skin toxicity presumably from etoposide     HISTORY OF PRESENTING ILLNESS:  [name] Kerstetter89.o. male is here for further up of extensive stage SCLC.   [name] accompanied by his wife. I personally reviewed and went over imaging studies with the patient.  [facility]beth RenshawHe has already been seen for simulation and treatment planning. He is going to have chest consolidation and WBRT.  He will have surgery for his aneurysm this Friday, 3/3.  Denies any chest pain or shortness of breath. His cough is okay. Denies nausea, vomiting, or bowel issues.   He does not need any refills at this time.   MEDICAL HISTORY:  Past Medical History  Diagnosis Date  . Coronary artery disease  2002    Lesion in LAD s/p angioplasty; s/p CABG x 4 Sept 2013  . Tobacco abuse   . Myocardial infarct [facility]) [id number]    stents  . Hyperlipidemia 1999  . Essential hypertension,  benign 1999  . Lung mass   . COPD (chronic obstructive pulmonary disease) ([place])   . Shortness of breath dyspnea     with exertion, sleeps with more pillows under head  . Arthritis   . Pancreatitis   . Complication of anesthesia     woke up while being extubated and remembers fighting  . Cancer ([place])     lung  . Mass of pancreas     SURGICAL HISTORY: Past Surgical History  Procedure Laterality Date  . Knee surgery      left x 2  ACL repair  . Tee without cardioversion  01/02/2012    Procedure: TRANSESOPHAGEAL ECHOCARDIOGRAM (TEE);  Surgeon: [name], MD;  Location: [place];  Service: Open Heart Surgery;  Laterality: N/A;  . Multiple extractions with alveoloplasty  02/21/2012    Procedure: MULTIPLE EXTRACION WITH ALVEOLOPLASTY;  Surgeon: Lenn Cal, DDS;  Location: [facility];  Service: Oral Surgery;  Laterality: N/A;  Extaction of tooth #'s 4,5,6,7,10,11,12,13,14,15,19,20,21,22,23,24,25,   . Left heart catheterization with coronary angiogram N/A 01/01/2012    Procedure: LEFT HEART CATHETERIZATION WITH CORONARY ANGIOGRAM;  Surgeon: Hillary Bow, MD;  [facility] CATH LAB;  Service: Cardiovascular;  Laterality: N/A;  . Coronary artery bypass graft  01/02/2012    Procedure: CORONARY ARTERY BYPASS GRAFTING (CABG);  Surgeon: [name], MD;  Location: [place];  Service: Open Heart Surgery;  Laterality: N/A;  times three using left internal mammary artery and right endoscopically harvested saphenous vein  . Coronary angioplasty  1999, 2002  . Video bronchoscopy with endobronchial ultrasound N/A 01/26/2015    Procedure: VIDEO BRONCHOSCOPY WITH ENDOBRONCHIAL ULTRASOUND;  Surgeon: [name], MD;  Location: [place];  Service: Thoracic;  Laterality: N/A;  . Lung biopsy N/A 01/26/2015    Procedure: LUNG BIOPSY;  Surgeon: [name], MD;  Location: [place];  Service: Thoracic;  Laterality: N/A;  . Lymph node biopsy N/A 01/26/2015    Procedure: LYMPH NODE BIOPSY;  Surgeon:  [name], MD;  Location: [place];  Service: Thoracic;  Laterality: N/A;  . Portacath placement Left 02/01/2015    Procedure: ATTEMPTED INSERTION OF PORT-A-CATH;  Surgeon: [name], MD;  Location: [place];  Service: Thoracic;  Laterality: Left;  . Eus N/A 02/10/2015    Procedure: UPPER ENDOSCOPIC ULTRASOUND (EUS) LINEAR;  Surgeon: [name], MD;  [facility];  Service: Endoscopy;  Laterality: N/A;    SOCIAL HISTORY: Social History   Social History  . Marital Status: Married    Spouse Name: N/A  . Number of Children: 5  . Years of Education: N/A   Occupational History  . Works in [employer]  . Smoking status: Current Every Day Smoker -- 0.50 packs/day for 35 years    Types: Cigarettes    Start date: 06/30/1976  . Smokeless tobacco: Never Used  . Alcohol Use: 0.0 oz/week    0 Standard drinks or equivalent per week     Comment: maybe a 12 pack/mth if that - very rare since 2013  . Drug Use: No  . Sexual Activity: Yes   Other Topics Concern  . Not on file   Social History Narrative   They've been  married 17 years, with five kids.  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 Cancer Neg Hx   . Heart disease Father   . Diabetes Father   . Hyperlipidemia Father   . Hypertension Father   . Hyperlipidemia Mother   . Hypertension Mother   . Stroke Mother   . Diabetes Paternal Grandmother    indicated that his mother is alive. He indicated that his father is alive. He indicated that his sister is alive. He indicated that his brother is alive. He indicated that his maternal  grandmother is deceased. He indicated that his maternal grandfather is deceased. He indicated that his paternal grandmother is deceased. He indicated that his paternal grandfather is deceased.   His father's somewhere in [place]; his mother lives here in [place] and works at [employer] 76 at the end of this month She's got high blood pressure, high cholesterol, no strokes that they know of; is on blood thinners Has an aneurism at the base of the aorta where it goes into your stomach He knows his father's had high blood pressure and diabetes; Parents have been divorced since he was 3-4 Has half brothers / half sisters   ALLERGIES:  is allergic to lisinopril and tea.  MEDICATIONS:  Current Outpatient Prescriptions  Medication Sig Dispense Refill  . amLODipine (NORVASC) 5 MG tablet TAKE ONE (1) TABLET BY MOUTH EVERY DAY (Patient taking differently: TAKE ONE (1) TABLET BY MOUTH AT BEDTIME) 90 tablet 6  . aspirin EC 81 MG tablet Take 81 mg by mouth daily.    . carvedilol (COREG) 6.25 MG tablet TAKE ONE TABLET BY MOUTH TWICE A DAY WITH A MEAL 180 tablet 3  . diphenoxylate-atropine (LOMOTIL) 2.5-0.025 MG tablet Take 1 tablet by mouth as needed.  0  . pravastatin (PRAVACHOL) 40 MG tablet TAKE ONE (1) TABLET BY MOUTH EVERY DAY (Patient taking differently: TAKE ONE (1) TABLET BY MOUTH AT BEDTIME) 90 tablet 6  . triamcinolone cream (KENALOG) 0.1 %   0   No current facility-administered medications for this visit.    Review of Systems  Constitutional: Negative for fever, chills, weight loss and malaise/fatigue.  HENT: Negative for congestion, hearing loss, nosebleeds, sore throat and tinnitus.   Eyes: Negative for blurred vision, double vision, pain and discharge.  Respiratory: Negative for hemoptysis, cough, shortness of breath and wheezing.   Cardiovascular: Negative for palpitations, claudication, leg swelling and PND.  Gastrointestinal: Negative for heartburn, nausea, vomiting,  abdominal pain, diarrhea, constipation, blood in stool and melena.  Genitourinary: Negative for dysuria, urgency, frequency and hematuria.  Musculoskeletal: Positive for joint pain. Negative for myalgias and falls.       Has had knee surgeries.  Skin:  Negative for itching. Neurological: Negative for dizziness, tingling, tremors, sensory change, speech change, focal weakness, seizures, loss of consciousness, weakness and headaches.  Endo/Heme/Allergies: Does not bruise/bleed easily.  Psychiatric/Behavioral: Negative for depression, suicidal ideas, memory loss and substance abuse. The patient is not nervous/anxious and does not have insomnia.   All other systems reviewed and are negative.  14 point ROS was done  and is otherwise as detailed above or in HPI   PHYSICAL EXAMINATION: ECOG PERFORMANCE STATUS: 0 - Asymptomatic   Vitals with BMI 06/22/2015  Height   Weight 224 lbs  BMI   Systolic 008  Diastolic 81  Pulse 79  Respirations 20   Physical Exam  Constitutional: He is oriented to person, place, and time and well-developed, well-nourished, and in no distress. Alopecia  HENT:  Head: Normocephalic and atraumatic.  Nose: Nose normal.  Mouth/Throat: Oropharynx is clear and moist. No oropharyngeal exudate.  Dentures on top and bottom.  Eyes: Conjunctivae and EOM are normal. Pupils are equal, round, and reactive to light. Right eye exhibits no discharge. Left eye exhibits no discharge. No scleral icterus.  Neck: Normal range of motion. Neck supple. No tracheal deviation present. No thyromegaly present.  Cardiovascular: Normal rate, regular rhythm and normal heart sounds.  Exam reveals no gallop and no friction rub.   No murmur heard. Pulmonary/Chest: Effort normal. He has no wheezes. He has no rales.  Abdominal: Soft. Bowel sounds are normal. He exhibits no distension and no mass. There is no tenderness. There is no rebound and no guarding.  Musculoskeletal: Normal range of motion. He  exhibits no edema.  Lymphadenopathy:    He has no cervical adenopathy.  Neurological: He is alert and oriented to person, place, and time. He has normal reflexes. No cranial nerve deficit. Gait normal. Coordination normal.  Skin: Skin is warm and dry. Rash resolved Psychiatric: Mood, memory, affect and judgment normal.  Nursing note and vitals reviewed.   LABORATORY DATA:  I have reviewed the data as listed.  CBC    Component Value Date/Time   WBC 5.6 05/31/2015 0936   RBC 3.36* 05/31/2015 0936   HGB 10.8* 05/31/2015 0936   HCT 31.9* 05/31/2015 0936   PLT 129* 05/31/2015 0936   MCV 94.9 05/31/2015 0936   MCH 32.1 05/31/2015 0936   MCHC 33.9 05/31/2015 0936   RDW 18.0* 05/31/2015 0936   LYMPHSABS 1.6 05/31/2015 0936   MONOABS 0.6 05/31/2015 0936   EOSABS 0.3 05/31/2015 0936   BASOSABS 0.0 05/31/2015 0936   CMP     Component Value Date/Time   NA 136 05/31/2015 0936   K 4.0 05/31/2015 0936   CL 99* 05/31/2015 0936   CO2 28 05/31/2015 0936   GLUCOSE 101* 05/31/2015 0936   BUN 11 05/31/2015 0936   CREATININE 1.00 05/31/2015 0936   CREATININE 0.99 01/27/2015 1450   CALCIUM 9.1 05/31/2015 0936   PROT 7.7 05/31/2015 0936   ALBUMIN 4.2 05/31/2015 0936   AST 16 05/31/2015 0936   ALT 11* 05/31/2015 0936   ALKPHOS 65 05/31/2015 0936   BILITOT 0.6 05/31/2015 0936   GFRNONAA >60 05/31/2015 0936   GFRNONAA 87 01/27/2015 1450   GFRAA >60 05/31/2015 0936   GFRAA >89 01/27/2015 1450    RADIOGRAPHIC STUDIES: I have personally reviewed the radiological images as listed and agreed with the findings in the report. Study Result     CLINICAL DATA: Extensive stage small-cell lung cancer, diagnosed 9/16. Ongoing chemotherapy. Pancreatic cancer diagnosed 10/16.  EXAM: CT CHEST, ABDOMEN, AND PELVIS WITH CONTRAST  TECHNIQUE: Multidetector CT imaging of the chest, abdomen and pelvis was performed following the standard protocol during bolus administration of intravenous  contrast.  CONTRAST: 159m OMNIPAQUE IOHEXOL 300 MG/ML SOLN  COMPARISON: 05/15/2014  FINDINGS: CT CHEST FINDINGS  Mediastinum/Lymph Nodes: No supraclavicular adenopathy. Prior median sternotomy. Tortuous descending thoracic aorta. Normal heart size, without pericardial effusion. Upper  normal ascending aortic size, 3.9 cm. Pulmonary artery enlargement, with a 3.6 cm outflow tract. No central pulmonary embolism, on this non-dedicated study. No mediastinal or hilar adenopathy.  Lungs/Pleura: No pleural fluid. Centrilobular and paraseptal emphysema. Nodules along the right minor fissure, including on image 30/ series 7, measure on the order of 4 mm or less and are similar.  Soft tissue thickening about the right hilum is similar to slightly improved. Example along the lateral aspect of the right upper lobe bronchus including on image 25/series 7. Areas of probable scarring involving the right upper lobe including on image 28/series 7 are similar. No well-defined residual or recurrent tumor.  Musculoskeletal: No acute osseous abnormality.  CT ABDOMEN PELVIS FINDINGS  Hepatobiliary: Normal liver. Normal gallbladder, without biliary ductal dilatation.  Pancreas: Normal, without mass or ductal dilatation.  Spleen: Normal in size, without focal abnormality.  Adrenals/Urinary Tract: Normal adrenal glands. Normal kidneys, without hydronephrosis. Normal urinary bladder.  Stomach/Bowel: Normal stomach, without wall thickening. Normal colon, appendix, and terminal ileum. Normal small bowel.  Vascular/Lymphatic: Advanced aortic and branch vessel atherosclerosis. Infrarenal abdominal aortic aneurysm, including at 6.1 x 6.0 cm. Similar. Wall thrombus within. No surrounding hemorrhage. 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 Chronic. No abdominopelvic adenopathy.  Reproductive: Normal prostate.  Other: No significant free  fluid. No evidence of omental or peritoneal disease.  Musculoskeletal: No acute osseous abnormality.  IMPRESSION: CT CHEST IMPRESSION  1. Similar to further improvement in right perihilar interstitial thickening anatomic distortion. No well-defined residual or recurrent disease. 2. Pulmonary artery enlargement suggests pulmonary arterial hypertension.  CT ABDOMEN AND PELVIS IMPRESSION  1. No acute process or evidence of metastatic disease in the abdomen or pelvis. 2. Similar aneurysmal dilatation of the infrarenal aorta and right common iliac artery.   Electronically Signed  By: Abigail Miyamoto M.D.  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 Large hypermetabolic right hilar mass with mediastinal invasion and infrahilar extension  Hyponatremia c/w SIADH  EUS with Dr. JaArdis Hughsnd pancreatic biopsy on 02/10/2015 c/w SCLC Anemia secondary to chemotherapy CT C/A/P 04/13/2015 with interval response to therapy. Presumed drug reaction to etoposide Treatment related anemia CR to first line therapy   I personally reviewed and went over imaging studies with the patient.  He will have surgery for his aneurysm this Friday, 3/3.  [facility] Mo[facility]isbeth RenshawHe has already been seen for simulation and treatment planning. Plan is for chest consolidation and for WBRT.  He will return in 6 to 8 weeks for follow up with  CBC and CMP. We will repeat imaging studies after radiation treatment is complete.  Orders Placed This Encounter  Procedures  . CBC with Differential    Standing Status: Future     Number of Occurrences:      Standing Expiration Date: 06/21/2016  . Comprehensive metabolic panel    Standing Status: Future  Number of Occurrences:      Standing Expiration Date: 06/21/2016   All questions were answered. The patient knows to call the clinic with any problems, questions or concerns.  This document serves as a record of services personally performed by Ancil Linsey, MD. It was created on her behalf by Arlyce Harman, a trained medical scribe. The creation of this record is based on the scribe's personal observations and the provider's statements to them. This document has been checked and approved by the attending provider.  I have reviewed the above documentation for accuracy and completeness, and I agree with the above.  This note was electronically signed.    Kelby Fam. Penland, MD   06/22/2015 10:25 AM

## 2015-06-22 NOTE — Progress Notes (Signed)
   06/22/15 1221  OBSTRUCTIVE SLEEP APNEA  Have you ever been diagnosed with sleep apnea through a sleep study? No  Do you snore loudly (loud enough to be heard through closed doors)?  1  Do you often feel tired, fatigued, or sleepy during the daytime (such as falling asleep during driving or talking to someone)? 0  Has anyone observed you stop breathing during your sleep? 0  Do you have, or are you being treated for high blood pressure? 1  BMI more than 35 kg/m2? 0  Age > 50 (1-yes) 1  Neck circumference greater than:Male 16 inches or larger, Male 17inches or larger? 1  Male Gender (Yes=1) 1  Obstructive Sleep Apnea Score 5  Score 5 or greater  Results sent to PCP   This patient has screened at risk for sleep apnea using the STOP Bang tool used during a pre-surgical visit. A score of 5 or greater is at risk for sleep apnea.

## 2015-06-22 NOTE — Patient Instructions (Addendum)
[place] at [facility] Discharge Instructions  RECOMMENDATIONS MADE BY THE CONSULTANT AND ANY TEST RESULTS WILL BE SENT TO YOUR REFERRING PHYSICIAN.    Exam and discussion by Dr Whitney Muse today Your scans look good. You have had a completed response to chemotherapy. Return to see the doctor in 6-8 weeks with lab work Please call the clinic if you have any questions or concerns   Thank you for choosing [place] at [facility] to provide your oncology and hematology care.  To afford each patient quality time with our provider, please arrive at least 15 minutes before your scheduled appointment time.   Beginning January 23rd 2017 lab work for the [facility] will be done in the  Main lab at [facility] on 1st floor. If you have a lab appointment with the [place] please come in thru the  Main Entrance and check in at the main information desk  You need to re-schedule your appointment should you arrive 10 or more minutes late.  We strive to give you quality time with our providers, and arriving late affects you and other patients whose appointments are after yours.  Also, if you no show three or more times for appointments you may be dismissed from the clinic at the providers discretion.     Again, thank you for choosing [facility].  Our hope is that these requests will decrease the amount of time that you wait before being seen by our physicians.       _____________________________________________________________  Should you have questions after your visit to [facility], please contact our office at [phone] between the hours of 8:30 a.m. and 4:30 p.m.  Voicemails left after 4:30 p.m. will not be returned until the following business day.  For prescription refill requests, have your pharmacy contact our office.

## 2015-06-22 NOTE — Progress Notes (Signed)
Pt called stating that someone had called him earlier. I saw where Cammie Sickle had left a voicemail for him so I gave him that information of positive PCR of Staph and that Lakea had called in the Mupirocin Ointment for him and he needed to get started on it tonight. He voiced understanding.

## 2015-06-23 ENCOUNTER — Encounter (HOSPITAL_COMMUNITY): Payer: Self-pay

## 2015-06-23 ENCOUNTER — Other Ambulatory Visit: Payer: Self-pay | Admitting: *Deleted

## 2015-06-23 ENCOUNTER — Encounter (HOSPITAL_COMMUNITY): Payer: Self-pay | Admitting: Vascular Surgery

## 2015-06-23 MED ORDER — SODIUM CHLORIDE 0.9 % IV SOLN: INTRAVENOUS | Status: DC

## 2015-06-23 MED ORDER — DEXTROSE 5 % IV SOLN: 1.5000 g | INTRAVENOUS | Status: DC

## 2015-06-23 NOTE — Progress Notes (Signed)
Anesthesia Chart Review: Patient is a 55 year old male scheduled for endovascular repair of AAA on 06/24/15 by Dr. Trula Slade.  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 BMI 32. Reports remembering waking up during intubation in the past (last two procedure done under MAC, but no documented issues with 01/26/15 GETA). OSA screening score was 5.   PCP is Dr. Jenna Luo, last visit 01/25/15.  Cardiologist is Dr. Rozann Lesches, last visit 10/2013.  CT surgeon is Dr. Lanelle Bal, last visit 01/2015. HEM-ONC is Dr. Ancil Linsey, last visit 06/22/15. He will begin radiation treatment in [place] with Dr. Lisbeth Renshaw in the near future.  Medications include amlodipine, ASA, carvedilol, pravastatin.   01/24/15 EKG: NSR. Incomplete RBBB. Nonspecific T wave abnormality. Septal T wave inversion was present as well on 10/26/13 comparison tracing.   01/01/12 PRE-CABG LHC: Final Conclusions:  Preserved overall LV function with severe three vessel disease with total occlusion of the RCA, high grade ostial dominant circ and ISR of the LAD.   01/01/12 Carotid U/S:  - No evidence of stenosis involving the right internal carotid artery. - Findings consistent with40-59%stenosisinvolving the left internal carotid artery. - Bilateral vertebral artery flow is antegrade.  06/21/15 CT Chest/ABD/Pelvis w/ contrast: IMPRESSION: CT CHEST IMPRESSION 1. Similar to further improvement in right perihilar interstitial thickening anatomic distortion. No well-defined residual or recurrent disease. 2. Pulmonary artery enlargement suggests pulmonary arterial hypertension. CT ABDOMEN AND  PELVIS IMPRESSION 1. No acute process or evidence of metastatic disease in the abdomen or pelvis. 2. Similar aneurysmal dilatation of the infrarenal aorta and right common iliac artery. Infrarenal abdominal aortic aneurysm, including at 6.1 x 6.0 cm. 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  Preoperative labs noted.   Discussed above with anesthesiologist Dr. Marcie Bal. Patient has not seen cardiology since 10/2013. Would recommend that they are on board with surgery plans. I have notified [facility] RN Zigmund Daniel who will discuss with Dr. Trula Slade. (Update: Case is being canceled for tomorrow.)  [facility] Short Stay Center/Anesthesiology Phone [phone] 06/23/2015 3:05 PM

## 2015-06-24 ENCOUNTER — Encounter (HOSPITAL_COMMUNITY): Admission: RE | Payer: Self-pay | Source: Ambulatory Visit

## 2015-06-24 ENCOUNTER — Inpatient Hospital Stay (HOSPITAL_COMMUNITY): Admission: RE | Admit: 2015-06-24 | Payer: BLUE CROSS/BLUE SHIELD | Source: Ambulatory Visit | Admitting: Surgery

## 2015-06-24 SURGERY — INSERTION, ENDOVASCULAR STENT GRAFT, AORTA, ABDOMINAL
Anesthesia: General

## 2015-06-28 ENCOUNTER — Encounter: Payer: Self-pay | Admitting: Cardiology

## 2015-06-28 ENCOUNTER — Ambulatory Visit (INDEPENDENT_AMBULATORY_CARE_PROVIDER_SITE_OTHER): Payer: BLUE CROSS/BLUE SHIELD | Admitting: Cardiology

## 2015-06-28 VITALS — BP 144/82 | HR 79 | Ht 70.0 in | Wt 224.0 lb

## 2015-06-28 DIAGNOSIS — I2510 Atherosclerotic heart disease of native coronary artery without angina pectoris: Secondary | ICD-10-CM

## 2015-06-28 DIAGNOSIS — I10 Essential (primary) hypertension: Secondary | ICD-10-CM

## 2015-06-28 DIAGNOSIS — I714 Abdominal aortic aneurysm, without rupture, unspecified: Secondary | ICD-10-CM

## 2015-06-28 DIAGNOSIS — Z01810 Encounter for preprocedural cardiovascular examination: Secondary | ICD-10-CM | POA: Diagnosis not present

## 2015-06-28 DIAGNOSIS — E782 Mixed hyperlipidemia: Secondary | ICD-10-CM | POA: Diagnosis not present

## 2015-06-28 NOTE — Progress Notes (Signed)
Cardiology Office Note  Date: 06/28/2015   ID: [name], DOB 10-23-1960, MRN [id number]  PCP: [name], MD  Primary Cardiologist: Rozann Lesches, MD   Chief Complaint  Patient presents with  . Preoperative evaluation  . Coronary Artery Disease    History of Present Illness: [name] is a 55 y.o. male former patient Dr. Lia Foyer that I saw once in follow-up back in July 2015. I reviewed interval records and updated his chart. He has a 6.2 cm infrarenal abdominal aortic aneurysm and has been evaluated by Dr. Trula Slade in anticipation of an endovascular repair. This was scheduled for Friday March 3, however canceled by anesthesia.  He is here today with his wife. Cardiac history is reviewed below, he underwent CABG in September 2013. He does not report any significant angina symptoms, stable NYHA class II dyspnea with typical activities. No nitroglycerin use. I reviewed his ECG today which shows sinus rhythm with incomplete right bundle-branch block.  He has not undergone assessment of LVEF since prior to CABG. We discussed obtaining a follow-up echocardiogram.  Additional history includes advanced small cell lung cancer. He states that he has completed chemotherapy and is undergoing additional radiation treatments. He follows with Dr. Whitney Muse.  Reviews most recent lab work, lipids have been relatively well controlled with LDL 87.  Past Medical History  Diagnosis Date  . Coronary artery disease 2002    Lesion in LAD s/p angioplasty; s/p CABG x 4 Sept 2013  . Myocardial infarct ([place]) 1999, 2002  . Hyperlipidemia   . Essential hypertension, benign   . 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 COPD (chronic obstructive pulmonary disease) ([place])   . Arthritis   . Pancreatitis   . AAA (abdominal aortic aneurysm) [facility])     Past Surgical History  Procedure Laterality Date  . Knee surgery Left     ACL repair  . Tee without  cardioversion  01/02/2012    Procedure: TRANSESOPHAGEAL ECHOCARDIOGRAM (TEE);  Surgeon: [name], MD;  Location: [place];  Service: Open Heart Surgery;  Laterality: N/A;  . Multiple extractions with alveoloplasty  02/21/2012    Procedure: MULTIPLE EXTRACION WITH ALVEOLOPLASTY;  Surgeon: Lenn Cal, DDS;  Location: [facility];  Service: Oral Surgery;  Laterality: N/A;  Extaction of tooth #'s 4,5,6,7,10,11,12,13,14,15,19,20,21,22,23,24,25,   . Left heart catheterization with coronary angiogram N/A 01/01/2012    Procedure: LEFT HEART CATHETERIZATION WITH CORONARY ANGIOGRAM;  Surgeon: Hillary Bow, MD;  Location: [facility] CATH LAB;  Service: Cardiovascular;  Laterality: N/A;  . Coronary angioplasty  1999, 2002  . Video bronchoscopy with endobronchial ultrasound N/A 01/26/2015    Procedure: VIDEO BRONCHOSCOPY WITH ENDOBRONCHIAL ULTRASOUND;  Surgeon: [name], MD;  Location: [place];  Service: Thoracic;  Laterality: N/A;  . Lung biopsy N/A 01/26/2015    Procedure: LUNG BIOPSY;  Surgeon: [name], MD;  Location: [place];  Service: Thoracic;  Laterality: N/A;  . Lymph node biopsy N/A 01/26/2015    Procedure: LYMPH NODE BIOPSY;  Surgeon: [name], MD;  Location: [facility];  Service: Thoracic;  Laterality: N/A;  . Portacath placement Left 02/01/2015    Procedure: ATTEMPTED INSERTION OF PORT-A-CATH;  Surgeon: [name], MD;  Location: [place];  Service: Thoracic;  Laterality: Left;  . Eus N/A 02/10/2015    Procedure: UPPER ENDOSCOPIC ULTRASOUND (EUS) LINEAR;  Surgeon: [name], MD;  Location: [facility];  Service: Endoscopy;  Laterality: N/A;  . Coronary artery bypass graft  01/02/2012    Procedure: CORONARY ARTERY BYPASS GRAFTING (CABG);  Surgeon: [name], MD;  Location: [place];  Service: Open Heart Surgery;  Laterality: N/A;  times three using left internal mammary artery and right endoscopically harvested saphenous vein    Current Outpatient Prescriptions    Medication Sig Dispense Refill  . amLODipine (NORVASC) 5 MG tablet TAKE ONE (1) TABLET BY MOUTH EVERY DAY (Patient taking differently: TAKE ONE (1) TABLET BY MOUTH AT BEDTIME) 90 tablet 6  . aspirin EC 81 MG tablet Take 81 mg by mouth daily.    . carvedilol (COREG) 6.25 MG tablet TAKE ONE TABLET BY MOUTH TWICE A DAY WITH A MEAL 180 tablet 3  . pravastatin (PRAVACHOL) 40 MG tablet TAKE ONE (1) TABLET BY MOUTH EVERY DAY (Patient taking differently: TAKE ONE (1) TABLET BY MOUTH AT BEDTIME) 90 tablet 6   No current facility-administered medications for this visit.   Allergies:  Lisinopril and Tea   Social History: The patient  reports that he has been smoking Cigarettes.  He started smoking about 39 years ago. He has a 52.5 pack-year smoking history. He has never used smokeless tobacco. He reports that he does not drink alcohol or use illicit drugs.   ROS:  Please see the history of present illness. Otherwise, complete review of systems is positive for NYHA class II dyspnea, no palpitations or syncope, no abdominal pain.  All other systems are reviewed and negative.   Physical Exam: VS:  BP 144/82 mmHg  Pulse 79  Ht '5\' 10"'$  (1.778 m)  Wt 224 lb (101.606 kg)  BMI 32.14 kg/m2  SpO2 99%, BMI Body mass index is 32.14 kg/(m^2).  Wt Readings from Last 3 Encounters:  06/28/15 224 lb (101.606 kg)  06/22/15 224 lb 1.6 oz (101.651 kg)  06/22/15 224 lb (101.606 kg)    General: Overweight male, appears comfortable at rest. HEENT: Conjunctiva and lids normal, oropharynx clear. Neck: Supple, no elevated JVP or carotid bruits, no thyromegaly. Lungs: Clear to auscultation, nonlabored breathing at rest. Cardiac: Regular rate and rhythm, no S3 or significant systolic murmur, no pericardial rub. Abdomen: Protuberant, nontender, bowel sounds present, no guarding or rebound. Extremities: No pitting edema, distal pulses 2+. Skin: Warm and dry. Musculoskeletal: No kyphosis. Neuropsychiatric: Alert and  oriented x3, affect grossly appropriate.  ECG: I personally reviewed the prior tracing from 01/24/2015 which showed sinus rhythm with incomplete right bundle branch block and nonspecific ST-T abnormalities.  Recent Labwork: 06/22/2015: ALT 13*; AST 21; BUN 12; Creatinine, Ser 1.07; Hemoglobin 10.6*; Platelets 144*; Potassium 3.7; Sodium 136     Component Value Date/Time   CHOL 162 01/27/2015 1450   TRIG 265* 01/27/2015 1450   HDL 22* 01/27/2015 1450   CHOLHDL 7.4* 01/27/2015 1450   VLDL 53* 01/27/2015 1450   LDLCALC 87 01/27/2015 1450   LDLDIRECT 124.8 06/28/2010 0916    Assessment and Plan:  1. Preoperative evaluation in a 55 year old male with multivessel CAD status post CABG in September 2013. He does not report any active angina symptoms on medical therapy. ECG reviewed and stable overall. He is being considered for endovascular repair of an infrarenal abdominal aortic aneurysm by Dr. Trula Slade. He has not undergone follow-up assessment of LVEF since prior to CABG we will obtain an echocardiogram. Unless any substantial abnormalities are uncovered that may require further evaluation, he should be able to proceed with planned intervention at overall intermediate risk.  2. Infrarenal abdominal  aortic aneurysm of 6.2 cm, followed by Dr. Trula Slade with plans for endovascular repair.   3. Essential hypertension, no changes made present regimen includes Coreg and Norvasc.   4. Hyperlipidemia, recent LDL 87 on Pravachol.  5. Advanced small cell lung cancer followed by Dr. Whitney Muse, status post chemotherapy and with plans for additional radiation treatments.  Current medicines were reviewed with the patient today.   Orders Placed This Encounter  Procedures  . EKG 12-Lead  . Echocardiogram    Disposition: FU with me in 6 months.   Signed, [name], MD, [facility] 06/28/2015 11:00 AM    [place] at [facility] [address] [place], [place] [address] Phone:  [phone]; Fax: [phone]

## 2015-06-28 NOTE — Patient Instructions (Signed)
Your physician wants you to follow-up in: 6 months with Dr Ferne Reus will receive a reminder letter in the mail two months in advance. If you don't receive a letter, please call our office to schedule the follow-up appointment.    Your physician recommends that you continue on your current medications as directed. Please refer to the Current Medication list given to you today.    Your physician has requested that you have an echocardiogram. Echocardiography is a painless test that uses sound waves to create images of your heart. It provides your doctor with information about the size and shape of your heart and how well your heart's chambers and valves are working. This procedure takes approximately one hour. There are no restrictions for this procedure.     Thank you for choosing [facility] !

## 2015-07-01 ENCOUNTER — Ambulatory Visit (HOSPITAL_COMMUNITY)
Admission: RE | Admit: 2015-07-01 | Discharge: 2015-07-01 | Disposition: A | Discharge status: Home / Self Care | Payer: BLUE CROSS/BLUE SHIELD | Source: Ambulatory Visit | Attending: Cardiology | Admitting: Cardiology

## 2015-07-01 DIAGNOSIS — R918 Other nonspecific abnormal finding of lung field: Secondary | ICD-10-CM | POA: Insufficient documentation

## 2015-07-01 DIAGNOSIS — I052 Rheumatic mitral stenosis with insufficiency: Secondary | ICD-10-CM | POA: Diagnosis not present

## 2015-07-01 DIAGNOSIS — I714 Abdominal aortic aneurysm, without rupture: Secondary | ICD-10-CM | POA: Diagnosis not present

## 2015-07-01 DIAGNOSIS — Z951 Presence of aortocoronary bypass graft: Secondary | ICD-10-CM | POA: Insufficient documentation

## 2015-07-01 DIAGNOSIS — J449 Chronic obstructive pulmonary disease, unspecified: Secondary | ICD-10-CM | POA: Insufficient documentation

## 2015-07-01 DIAGNOSIS — I10 Essential (primary) hypertension: Secondary | ICD-10-CM | POA: Diagnosis not present

## 2015-07-01 DIAGNOSIS — Z720 Tobacco use: Secondary | ICD-10-CM | POA: Insufficient documentation

## 2015-07-01 DIAGNOSIS — I2510 Atherosclerotic heart disease of native coronary artery without angina pectoris: Secondary | ICD-10-CM | POA: Insufficient documentation

## 2015-07-01 DIAGNOSIS — E785 Hyperlipidemia, unspecified: Secondary | ICD-10-CM | POA: Diagnosis not present

## 2015-07-01 LAB — ECHOCARDIOGRAM COMPLETE
CHL CUP AORTIC ROOT 2D: 34 mm
CHL CUP LA VOL 2D: 69.2 mL
EERAT: 13.66
EWDT: 194 ms
FS: 22 % — AB (ref 28–44)
IV/PV OW: 0.89
LA ID, A-P, ES: 37 mm
LA SIZE INDEX: 1.69 mm/m2
LA VOL 2D INDEX: 31.6 mL/m2
LEFT ATRIUM END SYS DIAM: 37 mm
LV PW d: 17 mm — AB (ref 0.6–1.1)
LV TDI E'LATERAL: 6.64 cm/s
LVIDD: 49.3 mm — AB (ref 3.5–6.0)
LVIDS: 38.5 mm — AB (ref 2.1–4.0)
MV Dec: 194 ms
MV pk E vel: 90.7 cm/s
MVPG: 3 mmHg
MVPKAVEL: 68.4 cm/s
PWSYS: 17 mm
RV TAPSE: 18.9 mm
TDI e' medial: 6.31 cm/s

## 2015-07-01 NOTE — Progress Notes (Signed)
*  PRELIMINARY RESULTS* Echocardiogram 2D Echocardiogram has been performed.  Leavy Cella 07/01/2015, 8:58 AM

## 2015-07-18 ENCOUNTER — Other Ambulatory Visit: Payer: Self-pay

## 2015-07-29 NOTE — Progress Notes (Signed)
Anesthesia Chart Review: Patient is a 55 year old male scheduled for endovascular repair of AAA on 08/11/15 by Dr. Trula Slade. Procedure was initially scheduled for 06/24/15 but was postponed due to need for cardiology preoperative evaluation. In addition, he underwent radiation therapy for stage IV lung cancer which I am told was completed 07/30/15. PAT is scheduled for 08/03/15 at 1500.  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 BMI 32. Reports remembering waking up during intubation in the past (last two procedure done under MAC, but no documented issues with 01/26/15 GETA). OSA screening score was 5.   PCP is Dr. Jenna Luo, last visit 01/25/15.  CT surgeon is Dr. Lanelle Bal, last visit 01/2015.  HEM-ONC is Dr. Ancil Linsey, last visit 06/22/15. He is scheduled for follow-up on 08/10/15. RAD-ONC is Dr. Lisbeth Renshaw in [place].   Cardiologist is Dr. Rozann Lesches, last visit 06/28/15. Following review of recent echo results, he wrote, "...Overall no significant abnormalities to require further assessment prior to pending endovascular AAA repair. He should be able to proceed at an acceptable perioperative cardiovascular risk."   Medications include amlodipine, ASA, carvedilol, pravastatin.   01/24/15 EKG: NSR. Incomplete RBBB. Nonspecific T wave abnormality. Septal T wave inversion was present as well on 10/26/13 comparison tracing.   07/01/15 Echo: Study Conclusions - Left ventricle: The cavity size was normal. There was moderate  concentric hypertrophy. Systolic function was low normal. The  estimated ejection fraction was approximately 50%. Features are   consistent with a pseudonormal left ventricular filling pattern,  with concomitant abnormal relaxation and increased filling  pressure (grade 2 diastolic dysfunction). Indeterminate filling  pressures. - Regional wall motion abnormality: Mild hypokinesis of the  basal-mid inferior and basal-mid inferolateral myocardium. - Aortic valve: Trileaflet; mildly calcified leaflets. - Aorta: Mild ascending aortic dilatation; maximal diameter 3.72  cm. - Mitral valve: Calcified annulus. There was mild regurgitation. - Left atrium: The atrium was mildly dilated.  03/26/12 ETT: Negative clinically and electrically. Late post procedure ECG was normal. Recommendations: Continue risk factor reduction.  01/01/12 PRE-CABG LHC: Final Conclusions:  Preserved overall LV function with severe three vessel disease with total occlusion of the RCA, high grade ostial dominant circ and ISR of the LAD.   01/01/12 Carotid U/S:  - No evidence of stenosis involving the right internal carotid artery. - Findings consistent with 40-59%stenosisinvolving the left internal carotid artery. - Bilateral vertebral artery flow is antegrade.  06/21/15 CT Chest/ABD/Pelvis w/ contrast: IMPRESSION: CT CHEST IMPRESSION 1. Similar to further improvement in right perihilar interstitial thickening anatomic distortion. No well-defined residual or recurrent disease. 2. Pulmonary artery enlargement suggests pulmonary arterial hypertension. CT ABDOMEN AND PELVIS IMPRESSION 1. No acute process or evidence of metastatic disease in the abdomen or pelvis. 2. Similar aneurysmal dilatation of the infrarenal aorta and right common iliac artery. Infrarenal abdominal aortic aneurysm, including at 6.1 x 6.0 cm. 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  Preoperative labs pending his 08/03/15 PAT visit.    [facility] Short Stay Center/Anesthesiology Phone [phone] 07/29/2015 2:56 PM

## 2015-08-03 ENCOUNTER — Inpatient Hospital Stay (HOSPITAL_COMMUNITY)
Admission: RE | Admit: 2015-08-03 | Discharge: 2015-08-03 | Disposition: A | Discharge status: Home / Self Care | Payer: BLUE CROSS/BLUE SHIELD | Source: Ambulatory Visit

## 2015-08-08 ENCOUNTER — Encounter (HOSPITAL_COMMUNITY)
Admission: RE | Admit: 2015-08-08 | Discharge: 2015-08-08 | Disposition: A | Discharge status: Home / Self Care | Payer: BLUE CROSS/BLUE SHIELD | Source: Ambulatory Visit | Attending: Surgery | Admitting: Surgery

## 2015-08-08 ENCOUNTER — Encounter (HOSPITAL_COMMUNITY): Payer: Self-pay

## 2015-08-08 ENCOUNTER — Other Ambulatory Visit (HOSPITAL_COMMUNITY): Payer: Self-pay | Admitting: *Deleted

## 2015-08-08 LAB — BLOOD GAS, ARTERIAL
ACID-BASE EXCESS: 1.1 mmol/L (ref 0.0–2.0)
BICARBONATE: 25.1 meq/L — AB (ref 20.0–24.0)
DRAWN BY: 421801
FIO2: 0.21
O2 Saturation: 96.2 %
PATIENT TEMPERATURE: 98.6
TCO2: 26.3 mmol/L (ref 0–100)
pCO2 arterial: 39 mmHg (ref 35.0–45.0)
pH, Arterial: 7.424 (ref 7.350–7.450)
pO2, Arterial: 78 mmHg — ABNORMAL LOW (ref 80.0–100.0)

## 2015-08-08 LAB — COMPREHENSIVE METABOLIC PANEL
ALBUMIN: 4.1 g/dL (ref 3.5–5.0)
ALK PHOS: 50 U/L (ref 38–126)
ALT: 13 U/L — ABNORMAL LOW (ref 17–63)
ANION GAP: 11 (ref 5–15)
AST: 18 U/L (ref 15–41)
BUN: 12 mg/dL (ref 6–20)
CALCIUM: 9.4 mg/dL (ref 8.9–10.3)
CO2: 24 mmol/L (ref 22–32)
Chloride: 101 mmol/L (ref 101–111)
Creatinine, Ser: 1.13 mg/dL (ref 0.61–1.24)
GFR calc Af Amer: >60 mL/min (ref >60)
GLUCOSE: 128 mg/dL — AB (ref 65–99)
Potassium: 3.9 mmol/L (ref 3.5–5.1)
Sodium: 136 mmol/L (ref 135–145)
TOTAL PROTEIN: 7.5 g/dL (ref 6.5–8.1)
Total Bilirubin: 0.8 mg/dL (ref 0.3–1.2)

## 2015-08-08 LAB — CBC
HEMATOCRIT: 35.7 % — AB (ref 39.0–52.0)
Hemoglobin: 12.1 g/dL — ABNORMAL LOW (ref 13.0–17.0)
MCH: 31.7 pg (ref 26.0–34.0)
MCHC: 33.9 g/dL (ref 30.0–36.0)
MCV: 93.5 fL (ref 78.0–100.0)
PLATELETS: 126 10*3/uL — AB (ref 150–400)
RBC: 3.82 MIL/uL — ABNORMAL LOW (ref 4.22–5.81)
RDW: 13.3 % (ref 11.5–15.5)
WBC: 4.4 10*3/uL (ref 4.0–10.5)

## 2015-08-08 LAB — URINALYSIS, ROUTINE W REFLEX MICROSCOPIC
BILIRUBIN URINE: NEGATIVE
GLUCOSE, UA: NEGATIVE mg/dL
HGB URINE DIPSTICK: NEGATIVE
Ketones, ur: NEGATIVE mg/dL
Leukocytes, UA: NEGATIVE
Nitrite: NEGATIVE
PROTEIN: NEGATIVE mg/dL
Specific Gravity, Urine: 1.008 (ref 1.005–1.030)
pH: 5.5 (ref 5.0–8.0)

## 2015-08-08 LAB — SURGICAL PCR SCREEN
MRSA, PCR: NEGATIVE
Staphylococcus aureus: POSITIVE — AB

## 2015-08-08 LAB — APTT: APTT: 27 s (ref 24–37)

## 2015-08-08 LAB — PROTIME-INR
INR: 0.95 (ref 0.00–1.49)
Prothrombin Time: 12.9 seconds (ref 11.6–15.2)

## 2015-08-08 NOTE — Pre-Procedure Instructions (Signed)
[name]  08/08/2015      [address] [place] [address] Phone: [phone] Fax: [phone]    Your procedure is scheduled on Thursday, August 11, 2015 at 10:35 AM  Report to [facility] Entrance "A" Admitting Office at 8:30 AM.  Call this number if you have problems the morning of surgery: [phone]  Any questions prior to day of surgery, please call [phone] between 8 & 4 PM.   Remember:  Do not eat food or drink liquids after midnight Wednesday, 08/10/15.  Take these medicines the morning of surgery with A SIP OF WATER: Aspirin, Carvedilol (Coreg)   Do not wear jewelry.  Do not wear lotions, powders, or cologne.  You may NOT wear deodorant.  Men may shave face and neck.  Do not bring valuables to the hospital.  [facility] is not responsible for any belongings or valuables.  Contacts, dentures or bridgework may not be worn into surgery.  Leave your suitcase in the car.  After surgery it may be brought to your room.  For patients admitted to the hospital, discharge time will be determined by your treatment team.  Special instructions:  [facility] - Preparing for Surgery  Before surgery, you can play an important role.  Because skin is not sterile, your skin needs to be as free of germs as possible.  You can reduce the number of germs on you skin by washing with CHG (chlorahexidine gluconate) soap before surgery.  CHG is an antiseptic cleaner which kills germs and bonds with the skin to continue killing germs even after washing.  Please DO NOT use if you have an allergy to CHG or antibacterial soaps.  If your skin becomes reddened/irritated stop using the CHG and inform your nurse when you arrive at Short Stay.  Do not shave (including legs and underarms) for at least 48 hours prior to the first CHG shower.  You may shave your face.  Please follow these instructions carefully:   1.  Shower  with CHG Soap the night before surgery and the                                morning of Surgery.  2.  If you choose to wash your hair, wash your hair first as usual with your       normal shampoo.  3.  After you shampoo, rinse your hair and body thoroughly to remove the                      Shampoo.  4.  Use CHG as you would any other liquid soap.  You can apply chg directly       to the skin and wash gently with scrungie or a clean washcloth.  5.  Apply the CHG Soap to your body ONLY FROM THE NECK DOWN.        Do not use on open wounds or open sores.  Avoid contact with your eyes, ears, mouth and genitals (private parts).  Wash genitals (private parts) with your normal soap.  6.  Wash thoroughly, paying special attention to the area where your surgery        will be performed.  7.  Thoroughly rinse your body with warm water from the neck down.  8.  DO NOT shower/wash with your normal  soap after using and rinsing off       the CHG Soap.  9.  Pat yourself dry with a clean towel.            10.  Wear clean pajamas.            11.  Place clean sheets on your bed the night of your first shower and do not        sleep with pets.  Day of Surgery  Do not apply any lotions/deodorants the morning of surgery.  Please wear clean clothes to the hospital.   Please read over the following fact sheets that you were given. Pain Booklet, Coughing and Deep Breathing, Blood Transfusion Information, MRSA Information and Surgical Site Infection Prevention

## 2015-08-08 NOTE — Progress Notes (Signed)
PCP is Dr. Jenna Luo Cardiologist is Dr Domenic Polite Echo noted from 07-01-15 Stress test noted from 01-15-11 Card cath noted from 01-01-12

## 2015-08-08 NOTE — Progress Notes (Signed)
Message left to inform Mr. Markin of pcr results and to pick up script at [facility].

## 2015-08-10 ENCOUNTER — Encounter (HOSPITAL_COMMUNITY): Payer: BLUE CROSS/BLUE SHIELD | Attending: Hematology & Oncology | Admitting: Hematology & Oncology

## 2015-08-10 ENCOUNTER — Other Ambulatory Visit (HOSPITAL_COMMUNITY): Payer: BLUE CROSS/BLUE SHIELD

## 2015-08-10 ENCOUNTER — Encounter (HOSPITAL_COMMUNITY): Payer: BLUE CROSS/BLUE SHIELD

## 2015-08-10 ENCOUNTER — Encounter (HOSPITAL_COMMUNITY): Payer: Self-pay | Admitting: Hematology & Oncology

## 2015-08-10 VITALS — BP 117/72 | HR 90 | Temp 97.9°F | Resp 20 | Wt 223.0 lb

## 2015-08-10 DIAGNOSIS — R918 Other nonspecific abnormal finding of lung field: Secondary | ICD-10-CM | POA: Insufficient documentation

## 2015-08-10 DIAGNOSIS — C3491 Malignant neoplasm of unspecified part of right bronchus or lung: Secondary | ICD-10-CM

## 2015-08-10 DIAGNOSIS — Z0000 Encounter for general adult medical examination without abnormal findings: Secondary | ICD-10-CM | POA: Insufficient documentation

## 2015-08-10 DIAGNOSIS — K869 Disease of pancreas, unspecified: Secondary | ICD-10-CM | POA: Diagnosis not present

## 2015-08-10 DIAGNOSIS — E871 Hypo-osmolality and hyponatremia: Secondary | ICD-10-CM | POA: Diagnosis not present

## 2015-08-10 DIAGNOSIS — D6481 Anemia due to antineoplastic chemotherapy: Secondary | ICD-10-CM

## 2015-08-10 DIAGNOSIS — Z720 Tobacco use: Secondary | ICD-10-CM

## 2015-08-10 DIAGNOSIS — C3411 Malignant neoplasm of upper lobe, right bronchus or lung: Secondary | ICD-10-CM

## 2015-08-10 LAB — COMPREHENSIVE METABOLIC PANEL
ALBUMIN: 4.2 g/dL (ref 3.5–5.0)
ALK PHOS: 45 U/L (ref 38–126)
ALT: 12 U/L — ABNORMAL LOW (ref 17–63)
ANION GAP: 9 (ref 5–15)
AST: 16 U/L (ref 15–41)
BILIRUBIN TOTAL: 0.7 mg/dL (ref 0.3–1.2)
BUN: 14 mg/dL (ref 6–20)
CALCIUM: 9.1 mg/dL (ref 8.9–10.3)
CO2: 28 mmol/L (ref 22–32)
Chloride: 100 mmol/L — ABNORMAL LOW (ref 101–111)
Creatinine, Ser: 1.05 mg/dL (ref 0.61–1.24)
GFR calc Af Amer: >60 mL/min (ref >60)
GFR calc non Af Amer: >60 mL/min (ref >60)
GLUCOSE: 137 mg/dL — AB (ref 65–99)
Potassium: 4.1 mmol/L (ref 3.5–5.1)
Sodium: 137 mmol/L (ref 135–145)
TOTAL PROTEIN: 7.7 g/dL (ref 6.5–8.1)

## 2015-08-10 LAB — CBC WITH DIFFERENTIAL/PLATELET
BASOS PCT: 0 %
Basophils Absolute: 0 10*3/uL (ref 0.0–0.1)
Eosinophils Absolute: 0.2 10*3/uL (ref 0.0–0.7)
Eosinophils Relative: 4 %
HEMATOCRIT: 35.2 % — AB (ref 39.0–52.0)
HEMOGLOBIN: 12.2 g/dL — AB (ref 13.0–17.0)
LYMPHS ABS: 1 10*3/uL (ref 0.7–4.0)
LYMPHS PCT: 24 %
MCH: 32.9 pg (ref 26.0–34.0)
MCHC: 34.7 g/dL (ref 30.0–36.0)
MCV: 94.9 fL (ref 78.0–100.0)
MONOS PCT: 7 %
Monocytes Absolute: 0.3 10*3/uL (ref 0.1–1.0)
NEUTROS ABS: 2.6 10*3/uL (ref 1.7–7.7)
NEUTROS PCT: 65 %
Platelets: 118 10*3/uL — ABNORMAL LOW (ref 150–400)
RBC: 3.71 MIL/uL — ABNORMAL LOW (ref 4.22–5.81)
RDW: 13.1 % (ref 11.5–15.5)
WBC: 4 10*3/uL (ref 4.0–10.5)

## 2015-08-10 NOTE — Patient Instructions (Addendum)
[place] at [facility] Discharge Instructions  RECOMMENDATIONS MADE BY THE CONSULTANT AND ANY TEST RESULTS WILL BE SENT TO YOUR REFERRING PHYSICIAN.   Exam and discussion by Dr Whitney Muse today Lab work is stable today CT scans  Labs when you return to see the doctor  Return to see the doctor after your CT scans  Please call the clinic if you have any questions or concerns     Thank you for choosing [place] at [facility] to provide your oncology and hematology care.  To afford each patient quality time with our provider, please arrive at least 15 minutes before your scheduled appointment time.   Beginning January 23rd 2017 lab work for the [facility] will be done in the  Main lab at [facility] on 1st floor. If you have a lab appointment with the [facility] please come in thru the  Main Entrance and check in at the main information desk  You need to re-schedule your appointment should you arrive 10 or more minutes late.  We strive to give you quality time with our providers, and arriving late affects you and other patients whose appointments are after yours.  Also, if you no show three or more times for appointments you may be dismissed from the clinic at the providers discretion.     Again, thank you for choosing [facility].  Our hope is that these requests will decrease the amount of time that you wait before being seen by our physicians.       _____________________________________________________________  Should you have questions after your visit to [facility], please contact our office at [phone] between the hours of 8:30 a.m. and 4:30 p.m.  Voicemails left after 4:30 p.m. will not be returned until the following business day.  For prescription refill requests, have your pharmacy contact our office.         Resources For Cancer Patients and their Caregivers ? American Cancer Society: Can  assist with transportation, wigs, general needs, runs Look Good Feel Better.        458-613-8527 ? Cancer Care: Provides financial assistance, online support groups, medication/co-pay assistance.  1-800-813-HOPE (732) 809-3125) ? [place] Assists [place] cancer patients and their families through emotional , educational and financial support.  3125590325 ? [place] DSS Where to apply for food stamps, Medicaid and utility assistance. (618)809-9942 ? [facility]: Transportation to medical appointments. (661)865-3199 ? Social Security Administration: May apply for disability if have a Stage IV cancer. 269 069 3868 (407) 728-9826 ? [employer], Disability and Transit Services: Assists with nutrition, care and transit needs. (779)669-0027

## 2015-08-10 NOTE — Progress Notes (Signed)
[name]  Patient Care Team: [name], MD as PCP - General (Family Medicine) [name], MD as Consulting Physician (Cardiology) [name], MD as Consulting Physician (Cardiothoracic Surgery) [name], MD as Consulting Physician (Hematology and Oncology) [name], MD as Consulting Physician (Cardiology)  CHIEF COMPLAINTS:  Advanced Lung Cancer Clinical Stage IIIB (T4N2M0)  CT Chest with 8.7 x 8.7 cm R hilar mass encasing R pulmonary artery and occluding the R upper lobe bronchus, mild airspace disease in the peripheral R upper lobe probably postobstructive pneumonia 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    01/18/2015 Imaging CT chest- 8.7 x 8.7 cm R hilar mass encasing the R pulm artery and occluding the R upper lobe bronchus. Sm R pleural effusion; malignant or reactive. 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 34 mm x 26 mm mass in body of pancreas   01/21/2015 Imaging MRI brain- No acute intracranial findings. No signs of metastatic disease. Minor white matter disease, stable and nonspecific.   01/26/2015 Pathology Results Lung, biopsy, right upper lobe - SMALL CELL CARCINOMA.   01/26/2015 Pathology Results Diagnosis FINE NEEDLE ASPIRATION: ENDOSCOPIC SPECIMEN A, EBUS LEVEL 7 NODE (SPECIMEN 1 OF 3 COLLECTED 01/26/2015) MALIGNANT CELLS PRESENT, CONSISTENT WITH SMALL CELL CARCINOMA.   01/26/2015 Pathology Results WANG NEEDLE ASPIRATION, SPECIMEN B LEVEL 7 NODE (SPECIMEN 2 OF 3 COLLECTED 01/26/2015) MALIGNANT CELLS PRESENT, CONSISTENT WITH  SMALL CELL CARCINOMA.   01/26/2015 Pathology Results Diagnosis BRONCHIAL BRUSHING SPECIMEN C, RIGHT UPPER LOBE (SPECIMEN 3 OF 3 COLLECTED 01/26/2015) ATYPICAL CELLS PRESENT.   01/27/2015 PET scan Large hypermetabolic right hilar mass with mediastinal invasion and infrahilar extension, maximum standard uptake value 13.0.  Pancreatic tail mass, approximately 4.5 cm in diameter. Abdominal aortic aneurysm, 5.8 cm in diameter.   02/10/2015 Pathology Results Diagnosis FINE NEEDLE ASPIRATION, ENDOSCOPIC, PANCREAS TAIL (SPECIMEN 1 OF 1 COLLECTED 02/10/15): MALIGNANT CELLS CONSISTENT WITH METASTATIC SMALL CELL CARCINOMA.   02/14/2015 - 03/06/2015 Chemotherapy Cisplatin/Etoposide   03/06/2015 Treatment Plan Change Change systemic therapy to Carboplatin based   03/07/2015 - 05/10/2015 Chemotherapy Carboplatin/Etoposide.   04/13/2015 Imaging resolution of pancreatic metastases, decrease in size of R perihilar mss and RUL mass. Interval response to therapy   05/10/2015 -  Chemotherapy Change to carboplatin/irinotecan for cycle #5 and #6 secondary to significant skin toxicity presumably from etoposide   07/12/2015 -  Radiation Therapy 25 Gy to whole brain and 30 Gy to R hilar region by Dr. MoLisbeth Renshaw    HISTORY OF PRESENTING ILLNESS:  [name] Limbach89.o. male is here for further up of extensive stage SCLC.   [name] here alone. His appetite is good and has no trouble eating.   He is doing well overall with no major complaints or concerns. He notes his energy is ok, radiation was described as fatiguing.  He has completed radiation treatment. He skipped his last treatment day due to head pain and fatigue. 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   He has been rescheduled for aneurysm surgery  tomorrow, 4/20.  He denies headaches, cough, SOB, nausea or change in bowel habits.   MEDICAL HISTORY:  Past Medical History  Diagnosis Date  . Coronary artery disease 2002    Lesion in LAD s/p  angioplasty; s/p CABG x 4 Sept 2013  . Myocardial infarct ([name]) 1999, 2002  . Hyperlipidemia   . Essential hypertension, benign   . Small cell lung cancer ([name])     Stage IIIB with extension the pancreas  . COPD (chronic obstructive pulmonary disease) ([name])   . Arthritis   . Pancreatitis   . AAA (abdominal aortic aneurysm) ([name])     SURGICAL HISTORY: Past Surgical History  Procedure Laterality Date  . Knee surgery Left     ACL repair  . Tee without cardioversion  01/02/2012    Procedure: TRANSESOPHAGEAL ECHOCARDIOGRAM (TEE);  Surgeon: [name], MD;  Location: [facility];  Service: Open Heart Surgery;  Laterality: N/A;  . Multiple extractions with alveoloplasty  02/21/2012    Procedure: MULTIPLE EXTRACION WITH ALVEOLOPLASTY;  Surgeon: Lenn Cal, DDS;  Location: [facility];  Service: Oral Surgery;  Laterality: N/A;  Extaction of tooth #'s 4,5,6,7,10,11,12,13,14,15,19,20,21,22,23,24,25,   . Left heart catheterization with coronary angiogram N/A 01/01/2012    Procedure: LEFT HEART CATHETERIZATION WITH CORONARY ANGIOGRAM;  Surgeon: Hillary Bow, MD;  Location: [facility];  Service: Cardiovascular;  Laterality: N/A;  . Coronary angioplasty  1999, 2002  . Video bronchoscopy with endobronchial ultrasound N/A 01/26/2015    Procedure: VIDEO BRONCHOSCOPY WITH ENDOBRONCHIAL ULTRASOUND;  Surgeon: [name], MD;  Location: [place];  Service: Thoracic;  Laterality: N/A;  . Lung biopsy N/A 01/26/2015    Procedure: LUNG BIOPSY;  Surgeon: [name], MD;  Location: [place];  Service: Thoracic;  Laterality: N/A;  . Lymph node biopsy N/A 01/26/2015    Procedure: LYMPH NODE BIOPSY;  Surgeon: [name], MD;  Location: [place];  Service: Thoracic;  Laterality: N/A;  . Portacath placement Left 02/01/2015    Procedure: ATTEMPTED INSERTION OF PORT-A-CATH;  Surgeon: [name], MD;  Location: [place];  Service: Thoracic;  Laterality: Left;  . Eus N/A 02/10/2015    Procedure:  UPPER ENDOSCOPIC ULTRASOUND (EUS) LINEAR;  Surgeon: [name], MD;  Location: [facility];  Service: Endoscopy;  Laterality: N/A;  . Coronary artery bypass graft  01/02/2012    Procedure: CORONARY ARTERY BYPASS GRAFTING (CABG);  Surgeon: [name], MD;  Location: [place];  Service: Open Heart Surgery;  Laterality: N/A;  times three using left internal mammary artery and right endoscopically harvested saphenous vein    SOCIAL HISTORY: Social History   Social History  . Marital Status: Married    Spouse Name: N/A  . Number of Children: 5  . Years of Education: N/A   Occupational History  . Works in [employer]  . Smoking status: Current Every Day Smoker -- 1.50 packs/day for 35 years    Types: Cigarettes    Start date: 06/30/1976  . Smokeless tobacco: Never Used  . Alcohol Use: No     Comment: Maybe a 12 pack/mth if that - very rare since 2013  . Drug Use: No  . Sexual Activity: Yes   Other Topics Concern  . Not on file   Social History Narrative   Darral Dash been married 17 years, with five kids.  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 Cancer Neg Hx   . Heart disease Father   . Diabetes Father   . Hyperlipidemia Father   . Hypertension Father   . Hyperlipidemia Mother   . Hypertension Mother   . Stroke Mother   . Diabetes Paternal Grandmother    indicated that his mother is alive. He indicated that his father is alive. He indicated that his sister is alive. He indicated that his brother is alive. He indicated that his maternal grandmother is deceased. He indicated that his maternal  grandfather is deceased. He indicated that his paternal grandmother is deceased. He indicated that his paternal grandfather is deceased.   His father's somewhere in [place]; his mother lives here in [place] and works at [employer] 76 at the end of this month She's got high blood pressure, high cholesterol, no strokes that they know of; is on blood thinners Has an aneurism at the base of the aorta where it goes into your stomach He knows his father's had high blood pressure and diabetes; Parents have been divorced since he was 3-4 Has half brothers / half sisters   ALLERGIES:  is allergic to lisinopril and tea.  MEDICATIONS:  Current Outpatient Prescriptions  Medication Sig Dispense Refill  . amLODipine (NORVASC) 5 MG tablet TAKE ONE (1) TABLET BY MOUTH EVERY DAY (Patient taking differently: TAKE ONE (1) TABLET BY MOUTH AT BEDTIME) 90 tablet 6  . aspirin EC 81 MG tablet Take 81 mg by mouth daily.    . carvedilol (COREG) 6.25 MG tablet TAKE ONE TABLET BY MOUTH TWICE A DAY WITH A MEAL 180 tablet 3  . pravastatin (PRAVACHOL) 40 MG tablet TAKE ONE (1) TABLET BY MOUTH EVERY DAY (Patient taking differently: TAKE ONE (1) TABLET BY MOUTH AT BEDTIME) 90 tablet 6   No current facility-administered medications for this visit.    Review of Systems  Constitutional: Negative for fever, chills, weight loss and malaise/fatigue.  HENT: Negative for congestion, hearing loss, nosebleeds, sore throat and tinnitus.   Eyes: Negative for blurred vision, double vision, pain and discharge.  Respiratory: Negative for hemoptysis, cough, shortness of breath and wheezing.   Cardiovascular: Negative for palpitations, claudication, leg swelling and PND.  Gastrointestinal: Negative for heartburn, nausea, vomiting, abdominal pain, diarrhea, constipation, blood in stool and melena.  Genitourinary: Negative for dysuria, urgency, frequency and hematuria.  Musculoskeletal: Positive for joint pain. Negative for  myalgias and falls.       Has had knee surgeries.  Skin:  Negative for itching. Neurological: Negative for dizziness, tingling, tremors, sensory change, speech change, focal weakness, seizures, loss of consciousness, weakness and headaches.  Endo/Heme/Allergies: Does not bruise/bleed easily.  Psychiatric/Behavioral: Negative for depression, suicidal ideas, memory loss and substance abuse. The patient is not nervous/anxious and does not have insomnia.   All other systems reviewed and are negative.  14 point ROS was done and is otherwise as detailed above or in HPI   PHYSICAL EXAMINATION: ECOG PERFORMANCE STATUS: 0 - Asymptomatic   Vitals with BMI 08/10/2015  Height   Weight 223 lbs  BMI   Systolic 174  Diastolic 72  Pulse 90  Respirations 20  Physical Exam  Constitutional: He is oriented to person, place, and time and well-developed, well-nourished, and in no distress. Alopecia  HENT:  Head: Normocephalic and atraumatic.  Nose: Nose normal.  Mouth/Throat: Oropharynx is clear and moist. No oropharyngeal exudate.  Dentures on top and bottom.  Eyes: Conjunctivae and EOM are normal. Pupils are equal, round, and reactive to light. Right eye exhibits no discharge. Left eye exhibits no discharge. No scleral icterus.  Neck: Normal range of motion. Neck supple. No tracheal deviation present. No thyromegaly present.  Cardiovascular: Normal rate, regular rhythm and normal heart sounds.  Exam reveals no gallop and no friction rub.   No murmur heard. Pulmonary/Chest: Effort normal. He has no wheezes. He has no rales.  Abdominal: Soft. Bowel sounds are normal. He exhibits no distension and no mass. There is no tenderness. There is no rebound and no guarding.  Musculoskeletal: Normal range of motion. He exhibits no edema.  Lymphadenopathy:    He has no cervical adenopathy.  Neurological: He is alert and oriented to person, place, and time. He has normal reflexes. No cranial nerve deficit. Gait  normal. Coordination normal.  Skin: Skin is warm and dry. Rash resolved Psychiatric: Mood, memory, affect and judgment normal.  Nursing note and vitals reviewed.   LABORATORY DATA:  I have reviewed the data as listed.  CBC    Component Value Date/Time   WBC 4.0 08/10/2015 0942   RBC 3.71* 08/10/2015 0942   HGB 12.2* 08/10/2015 0942   HCT 35.2* 08/10/2015 0942   PLT 118* 08/10/2015 0942   MCV 94.9 08/10/2015 0942   MCH 32.9 08/10/2015 0942   MCHC 34.7 08/10/2015 0942   RDW 13.1 08/10/2015 0942   LYMPHSABS 1.0 08/10/2015 0942   MONOABS 0.3 08/10/2015 0942   EOSABS 0.2 08/10/2015 0942   BASOSABS 0.0 08/10/2015 0942   CMP     Component Value Date/Time   NA 136 08/08/2015 0929   K 3.9 08/08/2015 0929   CL 101 08/08/2015 0929   CO2 24 08/08/2015 0929   GLUCOSE 128* 08/08/2015 0929   BUN 12 08/08/2015 0929   CREATININE 1.13 08/08/2015 0929   CREATININE 0.99 01/27/2015 1450   CALCIUM 9.4 08/08/2015 0929   PROT 7.5 08/08/2015 0929   ALBUMIN 4.1 08/08/2015 0929   AST 18 08/08/2015 0929   ALT 13* 08/08/2015 0929   ALKPHOS 50 08/08/2015 0929   BILITOT 0.8 08/08/2015 0929   GFRNONAA >60 08/08/2015 0929   GFRNONAA 87 01/27/2015 1450   GFRAA >60 08/08/2015 0929   GFRAA >89 01/27/2015 1450    RADIOGRAPHIC STUDIES: I have personally reviewed the radiological images as listed and agreed with the findings in the report. Study Result     CLINICAL DATA: Extensive stage small-cell lung cancer, diagnosed 9/16. Ongoing chemotherapy. Pancreatic cancer diagnosed 10/16.  EXAM: CT CHEST, ABDOMEN, AND PELVIS WITH CONTRAST  TECHNIQUE: Multidetector CT imaging of the chest, abdomen and pelvis was performed following the standard protocol during bolus administration of intravenous contrast.  CONTRAST: 12m OMNIPAQUE IOHEXOL 300 MG/ML SOLN  COMPARISON: 05/15/2014  FINDINGS: CT CHEST FINDINGS  Mediastinum/Lymph Nodes: No supraclavicular adenopathy. Prior  median sternotomy. Tortuous descending thoracic aorta. Normal heart size, without pericardial effusion. Upper normal ascending aortic size, 3.9 cm. Pulmonary artery enlargement, with a 3.6 cm outflow tract. No central pulmonary embolism, on this non-dedicated study. No mediastinal or hilar adenopathy.  Lungs/Pleura: No pleural fluid. Centrilobular and paraseptal emphysema. Nodules along the right minor fissure, including on image 30/ series 7,  measure on the order of 4 mm or less and are similar.  Soft tissue thickening about the right hilum is similar to slightly improved. Example along the lateral aspect of the right upper lobe bronchus including on image 25/series 7. Areas of probable scarring involving the right upper lobe including on image 28/series 7 are similar. No well-defined residual or recurrent tumor.  Musculoskeletal: No acute osseous abnormality.  CT ABDOMEN PELVIS FINDINGS  Hepatobiliary: Normal liver. Normal gallbladder, without biliary ductal dilatation.  Pancreas: Normal, without mass or ductal dilatation.  Spleen: Normal in size, without focal abnormality.  Adrenals/Urinary Tract: Normal adrenal glands. Normal kidneys, without hydronephrosis. Normal urinary bladder.  Stomach/Bowel: Normal stomach, without wall thickening. Normal colon, appendix, and terminal ileum. Normal small bowel.  Vascular/Lymphatic: Advanced aortic and branch vessel atherosclerosis. Infrarenal abdominal aortic aneurysm, including at 6.1 x 6.0 cm. Similar. Wall thrombus within. No surrounding hemorrhage. 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 Chronic. No abdominopelvic adenopathy.  Reproductive: Normal prostate.  Other: No significant free fluid. No evidence of omental or peritoneal disease.  Musculoskeletal: No acute osseous abnormality.  IMPRESSION: CT CHEST IMPRESSION  1. Similar to further improvement in right  perihilar interstitial thickening anatomic distortion. No well-defined residual or recurrent disease. 2. Pulmonary artery enlargement suggests pulmonary arterial hypertension.  CT ABDOMEN AND PELVIS IMPRESSION  1. No acute process or evidence of metastatic disease in the abdomen or pelvis. 2. Similar aneurysmal dilatation of the infrarenal aorta and right common iliac artery.   Electronically Signed  By: Abigail Miyamoto M.D.  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 Large hypermetabolic right hilar mass with mediastinal invasion and infrahilar extension  Hyponatremia c/w SIADH  EUS with Dr. JaArdis Hughsnd pancreatic biopsy on 02/10/2015 c/w SCLC Anemia secondary to chemotherapy CT C/A/P 04/13/2015 with interval response to therapy. Presumed drug reaction to etoposide Treatment related anemia CR to first line therapy  He is doing fairly well with mild fatigue from XRT. He has completed radiation treatment. Unfortunately,  he skipped the last treatment day due to side effects. His previous aneurysm surgery was cancelled. He has been rescheduled for surgery tomorrow, 4/20.  He will return after repeat imaging studies. I have ordered these for next month. He was advised to call in the interim with problems or concerns. He does not currently need any refills.   Orders Placed This Encounter  Procedures  . CT Abdomen Pelvis W Contrast    Standing Status: Future      Number of Occurrences:      Standing Expiration Date: 08/09/2016    Order Specific Question:  If indicated for the ordered procedure, I authorize the administration of contrast media per Radiology protocol    Answer:  Yes    Order Specific Question:  Reason for Exam (SYMPTOM  OR DIAGNOSIS REQUIRED)    Answer:  restaging SCLC    Order Specific Question:  Preferred imaging location?  Answer:  [facility]  . CT Chest W Contrast    Standing Status: Future     Number of Occurrences:      Standing Expiration Date: 08/09/2016    Order Specific Question:  If indicated for the ordered procedure, I authorize the administration of contrast media per Radiology protocol    Answer:  Yes    Order Specific Question:  Reason for Exam (SYMPTOM  OR DIAGNOSIS REQUIRED)    Answer:  restaging SCLC    Order Specific Question:  Preferred imaging location?    Answer:  [facility]  . CBC with Differential    Standing Status: Future     Number of Occurrences:      Standing Expiration Date: 08/09/2016  . Comprehensive metabolic panel    Standing Status: Future     Number of Occurrences:      Standing Expiration Date: 08/09/2016   All questions were answered. The patient knows to call the clinic with any problems, questions or concerns.  This document serves as a record of services personally performed by Ancil Linsey, MD. It was created on her behalf by Arlyce Harman, a trained medical scribe. The creation of this record is based on the scribe's personal observations and the provider's statements to them. This document has been checked and approved by the attending provider.  I have reviewed the above documentation for accuracy and completeness, and I agree with the above.  This note was electronically signed.    Kelby Fam. Penland, MD   08/10/2015 10:01 AM

## 2015-08-11 ENCOUNTER — Inpatient Hospital Stay (HOSPITAL_COMMUNITY): Payer: BLUE CROSS/BLUE SHIELD

## 2015-08-11 ENCOUNTER — Inpatient Hospital Stay (HOSPITAL_COMMUNITY): Payer: BLUE CROSS/BLUE SHIELD | Admitting: Vascular Surgery

## 2015-08-11 ENCOUNTER — Encounter (HOSPITAL_COMMUNITY)
Admission: RE | Disposition: A | Discharge status: Home / Self Care | Payer: Self-pay | Source: Ambulatory Visit | Attending: Surgery

## 2015-08-11 ENCOUNTER — Encounter (HOSPITAL_COMMUNITY): Payer: Self-pay | Admitting: *Deleted

## 2015-08-11 ENCOUNTER — Other Ambulatory Visit: Payer: Self-pay | Admitting: *Deleted

## 2015-08-11 ENCOUNTER — Inpatient Hospital Stay (HOSPITAL_COMMUNITY)
Admission: RE | Admit: 2015-08-11 | Discharge: 2015-08-12 | DRG: 269 | Disposition: A | Discharge status: Home / Self Care | Payer: BLUE CROSS/BLUE SHIELD | Source: Ambulatory Visit | Attending: Surgery | Admitting: Surgery

## 2015-08-11 DIAGNOSIS — Z9221 Personal history of antineoplastic chemotherapy: Secondary | ICD-10-CM

## 2015-08-11 DIAGNOSIS — I2510 Atherosclerotic heart disease of native coronary artery without angina pectoris: Secondary | ICD-10-CM | POA: Diagnosis present

## 2015-08-11 DIAGNOSIS — Z9861 Coronary angioplasty status: Secondary | ICD-10-CM

## 2015-08-11 DIAGNOSIS — I714 Abdominal aortic aneurysm, without rupture, unspecified: Secondary | ICD-10-CM

## 2015-08-11 DIAGNOSIS — I10 Essential (primary) hypertension: Secondary | ICD-10-CM | POA: Diagnosis present

## 2015-08-11 DIAGNOSIS — Z951 Presence of aortocoronary bypass graft: Secondary | ICD-10-CM | POA: Diagnosis not present

## 2015-08-11 DIAGNOSIS — Z85118 Personal history of other malignant neoplasm of bronchus and lung: Secondary | ICD-10-CM

## 2015-08-11 DIAGNOSIS — Z8679 Personal history of other diseases of the circulatory system: Secondary | ICD-10-CM

## 2015-08-11 DIAGNOSIS — F17210 Nicotine dependence, cigarettes, uncomplicated: Secondary | ICD-10-CM | POA: Diagnosis present

## 2015-08-11 DIAGNOSIS — Z7982 Long term (current) use of aspirin: Secondary | ICD-10-CM | POA: Diagnosis not present

## 2015-08-11 DIAGNOSIS — I252 Old myocardial infarction: Secondary | ICD-10-CM | POA: Diagnosis not present

## 2015-08-11 DIAGNOSIS — E785 Hyperlipidemia, unspecified: Secondary | ICD-10-CM | POA: Diagnosis present

## 2015-08-11 DIAGNOSIS — Z9889 Other specified postprocedural states: Secondary | ICD-10-CM

## 2015-08-11 DIAGNOSIS — J449 Chronic obstructive pulmonary disease, unspecified: Secondary | ICD-10-CM | POA: Diagnosis present

## 2015-08-11 DIAGNOSIS — Z95828 Presence of other vascular implants and grafts: Secondary | ICD-10-CM

## 2015-08-11 HISTORY — PX: ABDOMINAL AORTIC ENDOVASCULAR STENT GRAFT: SHX5707

## 2015-08-11 LAB — CBC
HEMATOCRIT: 33.2 % — AB (ref 39.0–52.0)
Hemoglobin: 11.1 g/dL — ABNORMAL LOW (ref 13.0–17.0)
MCH: 31.6 pg (ref 26.0–34.0)
MCHC: 33.4 g/dL (ref 30.0–36.0)
MCV: 94.6 fL (ref 78.0–100.0)
PLATELETS: 119 10*3/uL — AB (ref 150–400)
RBC: 3.51 MIL/uL — AB (ref 4.22–5.81)
RDW: 13.4 % (ref 11.5–15.5)
WBC: 3.2 10*3/uL — ABNORMAL LOW (ref 4.0–10.5)

## 2015-08-11 LAB — BASIC METABOLIC PANEL
Anion gap: 10 (ref 5–15)
BUN: 11 mg/dL (ref 6–20)
CO2: 26 mmol/L (ref 22–32)
Calcium: 8.9 mg/dL (ref 8.9–10.3)
Chloride: 101 mmol/L (ref 101–111)
Creatinine, Ser: 1.16 mg/dL (ref 0.61–1.24)
GFR calc Af Amer: >60 mL/min (ref >60)
GLUCOSE: 103 mg/dL — AB (ref 65–99)
POTASSIUM: 4 mmol/L (ref 3.5–5.1)
Sodium: 137 mmol/L (ref 135–145)

## 2015-08-11 LAB — MAGNESIUM: Magnesium: 1.9 mg/dL (ref 1.7–2.4)

## 2015-08-11 LAB — TYPE AND SCREEN
ABO/RH(D): O POS
Antibody Screen: NEGATIVE

## 2015-08-11 LAB — APTT: aPTT: 30 seconds (ref 24–37)

## 2015-08-11 LAB — PROTIME-INR
INR: 1.27 (ref 0.00–1.49)
PROTHROMBIN TIME: 16.1 s — AB (ref 11.6–15.2)

## 2015-08-11 SURGERY — INSERTION, ENDOVASCULAR STENT GRAFT, AORTA, ABDOMINAL
Anesthesia: General

## 2015-08-11 MED ORDER — SODIUM CHLORIDE 0.9 % IV SOLN: INTRAVENOUS | Status: DC

## 2015-08-11 MED ORDER — GUAIFENESIN-DM 100-10 MG/5ML PO SYRP: 15.0000 mL | ORAL_SOLUTION | ORAL | Status: DC | PRN

## 2015-08-11 MED ORDER — SODIUM CHLORIDE 0.9 % IJ SOLN
INTRAMUSCULAR | Status: AC
  Filled 2015-08-11: qty 10

## 2015-08-11 MED ORDER — LABETALOL HCL 5 MG/ML IV SOLN
INTRAVENOUS | Status: AC
  Filled 2015-08-11: qty 8

## 2015-08-11 MED ORDER — SUGAMMADEX SODIUM 200 MG/2ML IV SOLN
INTRAVENOUS | Status: DC | PRN
  Administered 2015-08-11: 205 mg via INTRAVENOUS

## 2015-08-11 MED ORDER — SODIUM CHLORIDE 0.45 % IV SOLN
INTRAVENOUS | Status: DC
  Administered 2015-08-11: 18:00:00 via INTRAVENOUS

## 2015-08-11 MED ORDER — ALUM & MAG HYDROXIDE-SIMETH 200-200-20 MG/5ML PO SUSP: 15.0000 mL | ORAL | Status: DC | PRN

## 2015-08-11 MED ORDER — MUPIROCIN 2 % EX OINT
1.0000 "application " | TOPICAL_OINTMENT | Freq: Two times a day (BID) | CUTANEOUS | Status: DC
  Administered 2015-08-11: 1 via NASAL
  Filled 2015-08-11: qty 22

## 2015-08-11 MED ORDER — 0.9 % SODIUM CHLORIDE (POUR BTL) OPTIME
TOPICAL | Status: DC | PRN
  Administered 2015-08-11: 1000 mL

## 2015-08-11 MED ORDER — PRAVASTATIN SODIUM 40 MG PO TABS
40.0000 mg | ORAL_TABLET | Freq: Every day | ORAL | Status: DC
  Administered 2015-08-11: 40 mg via ORAL
  Filled 2015-08-11: qty 1

## 2015-08-11 MED ORDER — MIDAZOLAM HCL 5 MG/5ML IJ SOLN
INTRAMUSCULAR | Status: DC | PRN
  Administered 2015-08-11: 2 mg via INTRAVENOUS

## 2015-08-11 MED ORDER — CHLORHEXIDINE GLUCONATE 4 % EX LIQD: 60.0000 mL | Freq: Once | CUTANEOUS | Status: DC

## 2015-08-11 MED ORDER — PROTAMINE SULFATE 10 MG/ML IV SOLN
INTRAVENOUS | Status: AC
  Filled 2015-08-11: qty 15

## 2015-08-11 MED ORDER — PANTOPRAZOLE SODIUM 40 MG PO TBEC
40.0000 mg | DELAYED_RELEASE_TABLET | Freq: Every day | ORAL | Status: DC
  Administered 2015-08-12: 40 mg via ORAL
  Filled 2015-08-11: qty 1

## 2015-08-11 MED ORDER — MIDAZOLAM HCL 2 MG/2ML IJ SOLN: 0.5000 mg | Freq: Once | INTRAMUSCULAR | Status: DC | PRN

## 2015-08-11 MED ORDER — ACETAMINOPHEN 650 MG RE SUPP: 325.0000 mg | RECTAL | Status: DC | PRN

## 2015-08-11 MED ORDER — ONDANSETRON HCL 4 MG/2ML IJ SOLN
INTRAMUSCULAR | Status: DC | PRN
  Administered 2015-08-11: 4 mg via INTRAVENOUS

## 2015-08-11 MED ORDER — SODIUM CHLORIDE 0.9 % IV SOLN
INTRAVENOUS | Status: DC | PRN
  Administered 2015-08-11: 500 mL

## 2015-08-11 MED ORDER — PHENOL 1.4 % MT LIQD
1.0000 | OROMUCOSAL | Status: DC | PRN
  Filled 2015-08-11: qty 177

## 2015-08-11 MED ORDER — ONDANSETRON HCL 4 MG/2ML IJ SOLN
INTRAMUSCULAR | Status: AC
  Filled 2015-08-11: qty 2

## 2015-08-11 MED ORDER — LIDOCAINE HCL (CARDIAC) 20 MG/ML IV SOLN
INTRAVENOUS | Status: DC | PRN
  Administered 2015-08-11: 30 mg via INTRAVENOUS

## 2015-08-11 MED ORDER — MUPIROCIN 2 % EX OINT: 1.0000 "application " | TOPICAL_OINTMENT | Freq: Two times a day (BID) | CUTANEOUS | Status: DC

## 2015-08-11 MED ORDER — MAGNESIUM SULFATE 2 GM/50ML IV SOLN: 2.0000 g | Freq: Every day | INTRAVENOUS | Status: DC | PRN

## 2015-08-11 MED ORDER — LIDOCAINE HCL (CARDIAC) 20 MG/ML IV SOLN
INTRAVENOUS | Status: AC
  Filled 2015-08-11: qty 5

## 2015-08-11 MED ORDER — MIDAZOLAM HCL 2 MG/2ML IJ SOLN
INTRAMUSCULAR | Status: AC
  Filled 2015-08-11: qty 2

## 2015-08-11 MED ORDER — DEXTROSE 5 % IV SOLN
1.5000 g | INTRAVENOUS | Status: AC
  Administered 2015-08-11: 1.5 g via INTRAVENOUS
  Filled 2015-08-11: qty 1.5

## 2015-08-11 MED ORDER — MEPERIDINE HCL 25 MG/ML IJ SOLN: 6.2500 mg | INTRAMUSCULAR | Status: DC | PRN

## 2015-08-11 MED ORDER — POTASSIUM CHLORIDE CRYS ER 20 MEQ PO TBCR: 20.0000 meq | EXTENDED_RELEASE_TABLET | Freq: Every day | ORAL | Status: DC | PRN

## 2015-08-11 MED ORDER — ROCURONIUM BROMIDE 100 MG/10ML IV SOLN
INTRAVENOUS | Status: DC | PRN
  Administered 2015-08-11: 50 mg via INTRAVENOUS
  Administered 2015-08-11: 10 mg via INTRAVENOUS

## 2015-08-11 MED ORDER — IODIXANOL 320 MG/ML IV SOLN
INTRAVENOUS | Status: DC | PRN
  Administered 2015-08-11: 50 mL via INTRAVENOUS

## 2015-08-11 MED ORDER — LACTATED RINGERS IV SOLN
INTRAVENOUS | Status: DC
  Administered 2015-08-11: 10:00:00 via INTRAVENOUS

## 2015-08-11 MED ORDER — FENTANYL CITRATE (PF) 250 MCG/5ML IJ SOLN
INTRAMUSCULAR | Status: AC
  Filled 2015-08-11: qty 5

## 2015-08-11 MED ORDER — PROPOFOL 10 MG/ML IV BOLUS
INTRAVENOUS | Status: DC | PRN
  Administered 2015-08-11: 50 mg via INTRAVENOUS
  Administered 2015-08-11: 150 mg via INTRAVENOUS

## 2015-08-11 MED ORDER — PHENYLEPHRINE HCL 10 MG/ML IJ SOLN
10.0000 mg | INTRAMUSCULAR | Status: DC | PRN
  Administered 2015-08-11: 40 ug/min via INTRAVENOUS

## 2015-08-11 MED ORDER — CHLORHEXIDINE GLUCONATE CLOTH 2 % EX PADS: 6.0000 | MEDICATED_PAD | Freq: Every day | CUTANEOUS | Status: DC

## 2015-08-11 MED ORDER — EPHEDRINE SULFATE 50 MG/ML IJ SOLN
INTRAMUSCULAR | Status: AC
  Filled 2015-08-11: qty 1

## 2015-08-11 MED ORDER — CARVEDILOL 6.25 MG PO TABS
6.2500 mg | ORAL_TABLET | Freq: Two times a day (BID) | ORAL | Status: DC
  Administered 2015-08-12: 6.25 mg via ORAL
  Filled 2015-08-11: qty 1

## 2015-08-11 MED ORDER — OXYCODONE-ACETAMINOPHEN 5-325 MG PO TABS
1.0000 | ORAL_TABLET | ORAL | Status: DC | PRN
  Administered 2015-08-12: 1 via ORAL
  Filled 2015-08-11: qty 2
  Filled 2015-08-11: qty 1

## 2015-08-11 MED ORDER — AMLODIPINE BESYLATE 5 MG PO TABS
5.0000 mg | ORAL_TABLET | Freq: Every day | ORAL | Status: DC
  Administered 2015-08-12: 5 mg via ORAL
  Filled 2015-08-11 (×2): qty 1

## 2015-08-11 MED ORDER — LABETALOL HCL 5 MG/ML IV SOLN: 10.0000 mg | INTRAVENOUS | Status: DC | PRN

## 2015-08-11 MED ORDER — PROPOFOL 10 MG/ML IV BOLUS
INTRAVENOUS | Status: AC
  Filled 2015-08-11: qty 20

## 2015-08-11 MED ORDER — ONDANSETRON HCL 4 MG/2ML IJ SOLN: 4.0000 mg | Freq: Four times a day (QID) | INTRAMUSCULAR | Status: DC | PRN

## 2015-08-11 MED ORDER — HYDRALAZINE HCL 20 MG/ML IJ SOLN: 5.0000 mg | INTRAMUSCULAR | Status: DC | PRN

## 2015-08-11 MED ORDER — ASPIRIN EC 81 MG PO TBEC
81.0000 mg | DELAYED_RELEASE_TABLET | Freq: Every day | ORAL | Status: DC
  Administered 2015-08-12: 81 mg via ORAL
  Filled 2015-08-11: qty 1

## 2015-08-11 MED ORDER — MORPHINE SULFATE (PF) 2 MG/ML IV SOLN: 1.0000 mg | INTRAVENOUS | Status: DC | PRN

## 2015-08-11 MED ORDER — LABETALOL HCL 5 MG/ML IV SOLN
INTRAVENOUS | Status: DC | PRN
  Administered 2015-08-11: 10 mg via INTRAVENOUS

## 2015-08-11 MED ORDER — FENTANYL CITRATE (PF) 100 MCG/2ML IJ SOLN
INTRAMUSCULAR | Status: DC | PRN
  Administered 2015-08-11 (×3): 50 ug via INTRAVENOUS
  Administered 2015-08-11: 100 ug via INTRAVENOUS

## 2015-08-11 MED ORDER — SODIUM CHLORIDE 0.9 % IV SOLN
500.0000 mL | Freq: Once | INTRAVENOUS | Status: AC | PRN
  Administered 2015-08-11 (×2): 500 mL via INTRAVENOUS

## 2015-08-11 MED ORDER — METOPROLOL TARTRATE 1 MG/ML IV SOLN: 2.0000 mg | INTRAVENOUS | Status: DC | PRN

## 2015-08-11 MED ORDER — CEFUROXIME SODIUM 1.5 G IJ SOLR
1.5000 g | Freq: Two times a day (BID) | INTRAMUSCULAR | Status: DC
  Administered 2015-08-11: 1.5 g via INTRAVENOUS
  Filled 2015-08-11 (×3): qty 1.5

## 2015-08-11 MED ORDER — OXYCODONE-ACETAMINOPHEN 5-325 MG PO TABS: 1.0000 | ORAL_TABLET | Freq: Four times a day (QID) | ORAL | Status: DC | PRN

## 2015-08-11 MED ORDER — SUGAMMADEX SODIUM 200 MG/2ML IV SOLN
INTRAVENOUS | Status: AC
  Filled 2015-08-11: qty 2

## 2015-08-11 MED ORDER — LACTATED RINGERS IV SOLN
INTRAVENOUS | Status: DC | PRN
  Administered 2015-08-11: 10:00:00 via INTRAVENOUS

## 2015-08-11 MED ORDER — ACETAMINOPHEN 325 MG PO TABS: 325.0000 mg | ORAL_TABLET | ORAL | Status: DC | PRN

## 2015-08-11 MED ORDER — DOCUSATE SODIUM 100 MG PO CAPS
100.0000 mg | ORAL_CAPSULE | Freq: Every day | ORAL | Status: DC
Start: 2015-08-12 — End: 2015-08-12

## 2015-08-11 MED ORDER — PROTAMINE SULFATE 10 MG/ML IV SOLN
INTRAVENOUS | Status: DC | PRN
  Administered 2015-08-11: 50 mg via INTRAVENOUS

## 2015-08-11 MED ORDER — HEPARIN SODIUM (PORCINE) 1000 UNIT/ML IJ SOLN
INTRAMUSCULAR | Status: DC | PRN
  Administered 2015-08-11: 10 mL via INTRAVENOUS

## 2015-08-11 MED ORDER — ROCURONIUM BROMIDE 50 MG/5ML IV SOLN
INTRAVENOUS | Status: AC
  Filled 2015-08-11: qty 1

## 2015-08-11 SURGICAL SUPPLY — 65 items
BLADE SURG CLIPPER 3M 9600 (MISCELLANEOUS) ×3 IMPLANT
CANISTER SUCTION 2500CC (MISCELLANEOUS) ×3 IMPLANT
CATH ANGIO 5F BER2 65CM (CATHETERS) ×3 IMPLANT
CATH BEACON 5.038 65CM KMP-01 (CATHETERS) IMPLANT
CATH OMNI FLUSH .035X70CM (CATHETERS) ×3 IMPLANT
COVER PROBE W GEL 5X96 (DRAPES) ×3 IMPLANT
DEVICE CLOSURE PERCLS PRGLD 6F (VASCULAR PRODUCTS) ×5 IMPLANT
DRAPE ZERO GRAVITY STERILE (DRAPES) ×3 IMPLANT
DRSG TEGADERM 2-3/8X2-3/4 SM (GAUZE/BANDAGES/DRESSINGS) ×6 IMPLANT
DRYSEAL FLEXSHEATH 12FR 33CM (SHEATH) ×2
DRYSEAL FLEXSHEATH 18FR 33CM (SHEATH) ×2
ELECT REM PT RETURN 9FT ADLT (ELECTROSURGICAL) ×6
ELECTRODE REM PT RTRN 9FT ADLT (ELECTROSURGICAL) ×2 IMPLANT
EXCLUDER TNK 28.5X14.5X18CM (Endovascular Graft) ×1 IMPLANT
EXCLUDER TRUNK 28.5X14.5X18CM (Endovascular Graft) ×3 IMPLANT
GAUZE SPONGE 2X2 8PLY STRL LF (GAUZE/BANDAGES/DRESSINGS) ×2 IMPLANT
GLOVE BIO SURGEON STRL SZ 6.5 (GLOVE) ×2 IMPLANT
GLOVE BIO SURGEONS STRL SZ 6.5 (GLOVE) ×1
GLOVE BIOGEL PI IND STRL 6.5 (GLOVE) ×1 IMPLANT
GLOVE BIOGEL PI IND STRL 7.0 (GLOVE) ×2 IMPLANT
GLOVE BIOGEL PI IND STRL 7.5 (GLOVE) ×1 IMPLANT
GLOVE BIOGEL PI INDICATOR 6.5 (GLOVE) ×2
GLOVE BIOGEL PI INDICATOR 7.0 (GLOVE) ×4
GLOVE BIOGEL PI INDICATOR 7.5 (GLOVE) ×2
GLOVE ECLIPSE 7.5 STRL STRAW (GLOVE) ×3 IMPLANT
GLOVE SURG SS PI 6.5 STRL IVOR (GLOVE) ×3 IMPLANT
GLOVE SURG SS PI 7.5 STRL IVOR (GLOVE) ×3 IMPLANT
GOWN STRL REUS W/ TWL LRG LVL3 (GOWN DISPOSABLE) ×2 IMPLANT
GOWN STRL REUS W/ TWL XL LVL3 (GOWN DISPOSABLE) ×2 IMPLANT
GOWN STRL REUS W/TWL LRG LVL3 (GOWN DISPOSABLE) ×4
GOWN STRL REUS W/TWL XL LVL3 (GOWN DISPOSABLE) ×4
GRAFT BALLN CATH 65CM (STENTS) ×1 IMPLANT
GRAFT EXCLUDER AORTIC 28X3.3CM (Endovascular Graft) ×3 IMPLANT
HEMOSTAT SNOW SURGICEL 2X4 (HEMOSTASIS) IMPLANT
KIT BASIN OR (CUSTOM PROCEDURE TRAY) ×3 IMPLANT
KIT ROOM TURNOVER OR (KITS) ×3 IMPLANT
LEG CONTRALATERAL 16X20X13.5 (Vascular Products) ×2 IMPLANT
LIQUID BAND (GAUZE/BANDAGES/DRESSINGS) ×3 IMPLANT
NEEDLE PERC 18GX7CM (NEEDLE) ×3 IMPLANT
NS IRRIG 1000ML POUR BTL (IV SOLUTION) ×3 IMPLANT
PACK ENDOVASCULAR (PACKS) ×3 IMPLANT
PAD ARMBOARD 7.5X6 YLW CONV (MISCELLANEOUS) ×6 IMPLANT
PERCLOSE PROGLIDE 6F (VASCULAR PRODUCTS) ×15
SHEATH AVANTI 11CM 8FR (MISCELLANEOUS) ×3 IMPLANT
SHEATH BRITE TIP 8FR 23CM (MISCELLANEOUS) ×3 IMPLANT
SHEATH DRYSEAL FLEX 12FR 33CM (SHEATH) ×1 IMPLANT
SHEATH DRYSEAL FLEX 18FR 33CM (SHEATH) ×1 IMPLANT
SHIELD RADPAD SCOOP 12X17 (MISCELLANEOUS) ×6 IMPLANT
SPONGE GAUZE 2X2 STER 10/PKG (GAUZE/BANDAGES/DRESSINGS) ×4
STENT GRAFT BALLN CATH 65CM (STENTS) ×2
STENT GRAFT CONTRALAT 20X13.5 (Vascular Products) ×1 IMPLANT
STOPCOCK MORSE 400PSI 3WAY (MISCELLANEOUS) ×3 IMPLANT
SUT ETHILON 3 0 PS 1 (SUTURE) IMPLANT
SUT PROLENE 5 0 C 1 24 (SUTURE) IMPLANT
SUT VIC AB 2-0 CT1 27 (SUTURE)
SUT VIC AB 2-0 CT1 TAPERPNT 27 (SUTURE) IMPLANT
SUT VIC AB 3-0 SH 27 (SUTURE)
SUT VIC AB 3-0 SH 27X BRD (SUTURE) IMPLANT
SUT VICRYL 4-0 PS2 18IN ABS (SUTURE) ×6 IMPLANT
SYR 30ML LL (SYRINGE) ×3 IMPLANT
SYR MEDRAD MARK V 150ML (SYRINGE) ×3 IMPLANT
TRAY FOLEY W/METER SILVER 16FR (SET/KITS/TRAYS/PACK) ×3 IMPLANT
TUBING HIGH PRESSURE 120CM (CONNECTOR) ×3 IMPLANT
WIRE AMPLATZ SS-J .035X180CM (WIRE) ×6 IMPLANT
WIRE BENTSON .035X145CM (WIRE) ×6 IMPLANT

## 2015-08-11 NOTE — H&P (Signed)
Patient name: [name] PerdueMRN: [id number] DOB: 04-07-1962Sex: male    Chief Complaint  Patient presents with  . Re-evaluation    3 month f/u - s/p angiogram     HISTORY OF PRESENT ILLNESS: this is a 55 year old gentleman who is back for follow-up of his abdominal aortic aneurysm. This was initially detected when he underwent a PET scan for his lung cancer workup. He was diagnosed with Stage IV small cell lung cancer. He is undergone chemotherapy with a good response.  the patient as a history of emergent CABG. He is a long-term smoker and heavy alcohol user. He suffers of hypercholesterolemia which is managed with a statin. He is medically managed for hypertension. His PET scan indicated a 5.5-5.7 infrarenal aneurysm. Because of his newly diagnosed cancer I felt that we should observe him for a while with short follow-up. He is here today with a repeat CT scan. He has no complaints regarding abdominal pain or back pain.  Past Medical History  Diagnosis Date  . Coronary artery disease 2002    Lesion in LAD s/p angioplasty; s/p CABG x 4 Sept 2013  . Tobacco abuse   . Myocardial infarct [facility]) [id number]    stents  . Hyperlipidemia 1999  . Essential hypertension, benign 1999  . Lung mass   . COPD (chronic obstructive pulmonary disease) ([place])   . Shortness of breath dyspnea     with exertion, sleeps with more pillows under head  . Arthritis   . Pancreatitis   . Complication of anesthesia     woke up while being extubated and remembers fighting  . Cancer ([place])     lung  . Mass of pancreas     Past Surgical History  Procedure Laterality Date  . Knee surgery      left x 2 ACL repair  . Tee without cardioversion  01/02/2012    Procedure: TRANSESOPHAGEAL ECHOCARDIOGRAM (TEE); Surgeon: [name], MD; Location: [place]; Service: Open Heart Surgery; Laterality:  N/A;  . Multiple extractions with alveoloplasty  02/21/2012    Procedure: MULTIPLE EXTRACION WITH ALVEOLOPLASTY; Surgeon: Lenn Cal, DDS; Location: [facility]; Service: Oral Surgery; Laterality: N/A; Extaction of tooth #'s 4,5,6,7,10,11,12,13,14,15,19,20,21,22,23,24,25,   . Left heart catheterization with coronary angiogram N/A 01/01/2012    Procedure: LEFT HEART CATHETERIZATION WITH CORONARY ANGIOGRAM; Surgeon: Hillary Bow, MD; Location: [facility] CATH LAB; Service: Cardiovascular; Laterality: N/A;  . Coronary artery bypass graft  01/02/2012    Procedure: CORONARY ARTERY BYPASS GRAFTING (CABG); Surgeon: [name], MD; Location: [place]; Service: Open Heart Surgery; Laterality: N/A; times three using left internal mammary artery and right endoscopically harvested saphenous vein  . Coronary angioplasty  1999, 2002  . Video bronchoscopy with endobronchial ultrasound N/A 01/26/2015    Procedure: VIDEO BRONCHOSCOPY WITH ENDOBRONCHIAL ULTRASOUND; Surgeon: [name], MD; Location: [place]; Service: Thoracic; Laterality: N/A;  . Lung biopsy N/A 01/26/2015    Procedure: LUNG BIOPSY; Surgeon: [name], MD; Location: [place]; Service: Thoracic; Laterality: N/A;  . Lymph node biopsy N/A 01/26/2015    Procedure: LYMPH NODE BIOPSY; Surgeon: [name], MD; Location: [place]; Service: Thoracic; Laterality: N/A;  . Portacath placement Left 02/01/2015    Procedure: ATTEMPTED INSERTION OF PORT-A-CATH; Surgeon: [name], MD; Location: [place]; Service: Thoracic; Laterality: Left;  . Eus N/A 02/10/2015    Procedure: UPPER ENDOSCOPIC ULTRASOUND (EUS) LINEAR; Surgeon: [name], MD; Location: [facility]; Service: Endoscopy; Laterality: N/A;    Social History  Social History  . Marital Status: Married    Spouse Name: N/A  . Number of Children: 5  . Years of  Education: N/A   Occupational History  . Works in [place]  . Smoking status: Current Every Day Smoker -- 0.50 packs/day for 35 years    Types: Cigarettes    Start date: 06/30/1976  . Smokeless tobacco: Never Used  . Alcohol Use: 0.0 oz/week    0 Standard drinks or equivalent per week     Comment: maybe a 12 pack/mth if that - very rare since 2013  . Drug Use: No  . Sexual Activity: Yes   Other Topics Concern  . Not on file   Social History Narrative    Family History  Problem Relation Age of Onset  . Cancer Neg Hx   . Heart disease Father   . Diabetes Father   . Hyperlipidemia Father   . Hypertension Father   . Hyperlipidemia Mother   . Hypertension Mother   . Stroke Mother   . Diabetes Paternal Grandmother     Allergies as of 05/16/2015 - Review Complete 05/16/2015  Allergen Reaction Noted  . Lisinopril Cough 11/27/2010  . Tea Hives 01/03/2012    Current Outpatient Prescriptions on File Prior to Visit  Medication Sig Dispense Refill  . amLODipine (NORVASC) 5 MG tablet TAKE ONE (1) TABLET BY MOUTH EVERY DAY (Patient taking differently: TAKE ONE (1) TABLET BY MOUTH AT BEDTIME) 90 tablet 6  . aspirin EC 81 MG tablet Take 81 mg by mouth daily.    . carvedilol (COREG) 6.25 MG tablet TAKE ONE TABLET BY MOUTH TWICE A DAY WITH A MEAL 180 tablet 3  . diphenoxylate-atropine (LOMOTIL) 2.5-0.025 MG tablet May take 1-2 tabs four times a day as needed for loose stools. 45 tablet 0  . fentaNYL (DURAGESIC - DOSED MCG/HR) 50 MCG/HR Place 1 patch (50 mcg total) onto the skin every 3 (three) days. 10 patch 0  . HYDROcodone-acetaminophen (NORCO) 10-325 MG tablet Take 1 tablet by mouth every 6 (six) hours as needed. (Patient taking differently: Take 1 tablet by mouth every 6 (six) hours as needed for moderate pain. ) 90  tablet 0  . pravastatin (PRAVACHOL) 40 MG tablet TAKE ONE (1) TABLET BY MOUTH EVERY DAY (Patient taking differently: TAKE ONE (1) TABLET BY MOUTH AT BEDTIME) 90 tablet 6  . triamcinolone cream (KENALOG) 0.1 % Apply 1 application topically 2 (two) times daily. 80 g 1  . ondansetron (ZOFRAN) 8 MG tablet Take 1 tablet (8 mg total) by mouth every 8 (eight) hours as needed for nausea or vomiting. (Patient not taking: Reported on 04/13/2015) 30 tablet 2  . prochlorperazine (COMPAZINE) 10 MG tablet Take 1 tablet (10 mg total) by mouth every 6 (six) hours as needed for nausea or vomiting. (Patient not taking: Reported on 03/28/2015) 30 tablet 2   No current facility-administered medications on file prior to visit.     REVIEW OF SYSTEMS: Cardiovascular: No chest pain, chest pressure, palpitations, orthopnea, or dyspnea on exertion. No claudication or rest pain, No history of DVT or phlebitis. Pulmonary: No productive cough, asthma or wheezing. Neurologic: No weakness, paresthesias, aphasia, or amaurosis. No dizziness. Hematologic: No bleeding problems or clotting disorders. Musculoskeletal: No joint pain or joint swelling. Gastrointestinal: No blood in stool or hematemesis Genitourinary: No dysuria or hematuria. Psychiatric:: No history of major depression. Integumentary: Allergic allergy rash to his hand. Constitutional: No fever or chills.  PHYSICAL EXAMINATION:  Vital signs are  Filed Vitals:   05/16/15 1200  BP: 135/79  Pulse: 84  Temp: 97 F (36.1 C)  TempSrc: Oral  Height: '5\' 9"'$  (1.753 m)  Weight: 215 lb 9.6 oz (97.796 kg)  SpO2: 98%   Body mass index is 31.82 kg/(m^2). General: The patient appears their stated age. HEENT: No gross abnormalities Pulmonary: Non labored breathing Abdomen: Soft and non-tender Musculoskeletal: There are no major deformities. Neurologic: No focal weakness or paresthesias are detected, Skin: There are no  ulcer or rashes noted. Psychiatric: The patient has normal affect. Cardiovascular: There is a regular rate and rhythm without significant murmur appreciated. Palpable pedal pulses   Diagnostic Studies  I have reviewed his CT scan with the following findings: He has a 6.2 cm infrarenal abdominal aortic aneurysm. He has a continued good response to therapy of the right upper lobe mass. There is no evidence of progressive metastatic disease.  Assessment: 6.2 cm infrarenal abdominal aortic aneurysm Plan: we discussed proceeding with endovascular repair. I discussed the details of the Procedure with diagrams. We discussed the risks including but not limited to cardiopulmonary complications, intestinal ischemia, lower extremity ischemia bleeding , and renal insufficiency. All his questions were answered. I have placed him on the schedule for Friday, February 3       Pt has tolerated XRT well since original surgery date.  Crdiology clearance obtained CV:RRR PULM:CTAB Abd: soft   Assessment: AAA Plan:  EVAR.  All questions answered  Annamarie Major

## 2015-08-11 NOTE — Op Note (Signed)
Patient name: [name] MRN: [id number] DOB: [birth year] Sex: male  08/11/2015 Pre-operative Diagnosis: AAA Post-operative diagnosis:  Same Surgeon:  Annamarie Major Assistants:  Silva Bandy Procedure:   #1: Endovascular repair of abdominal aortic aneurysm   #2: Proximal extension 1   #3: Bilateral ultrasound-guided common femoral artery access   #4: Catheter in aorta 2   #5: Abdominal aortogram Anesthesia: Gen.  Blood Loss:  See anesthesia record Specimens:  None  Findings:  Complete exclusion Devices used: Main body was primary right Gore 28 x 14 x 18.  Contralateral left extension was a 20 x 13.5.  A proximal cuff was placed this was a 28 x 3.3  Indications:   The patient has been followed for a abdominal aortic aneurysm during his treatment for lung cancer.  He has completed his treatment and comes in today for repair.  Maximum diameter of 6.1 cm.  Procedure:  The patient was identified in the holding area and taken to [place] 16  The patient was then placed supine on the table. general anesthesia was administered.  The patient was prepped and draped in the usual sterile fashion.  A time out was called and antibiotics were administered.  Ultrasound was used to violate bilateral common femoral arteries which are widely patent without significant calcification.  A #11 blade was used to make a skin nick bilaterally.  Under ultrasound guidance, bilateral common femoral artery was cannulated with an 18-gauge needle.  A 035 wire was advanced without resistance.  An 8 French sheath was used to dilate the subcutaneous tissue.  Provide devices were deployed at the 11:00 and 1:00 position for pre-closure.  8 French sheaths were placed bilaterally.  The patient was fully heparinized.  Over a Amplatz superstiff wire, an 59 French sheath was advanced up the right side into the abdominal aorta.  A Omni flush cath was advanced over the left side of place of L-1.  An abdominal aortogram was performed  locating the renal arteries.  The main body device was prepared on the back table.  This was a Gore 28 x 14 x 18 device.  This was inserted and deployed landing at the level of the renal arteries.  Next, using a Berenstein 2 catheter and a dense and wire, the contralateral gate was cannulated.  A Berenstein 2 catheter was exchanged out for a Omni flush catheter which was able to be freely rotated within the main body, confirming successful cannulation.  Next, a Amplatz superstiff wire was inserted.  The image detector was rotated to a right anterior oblique position and a retrograde injection of the sheath was performed locating the left hypogastric artery.  A Amplatz superstiff wire was inserted.  A French sheath was exchanged out for a 12 [place] sheath which was advanced into the contralateral gate.  The contralateral limb was prepared on the back table this was a Gore 20 x 13.5.  It was inserted through the sheath which was then withdrawn and the device deployed letting proximal to the left hypogastric artery.  Next, the image detector was rotated to left anterior oblique position and a retrograde injection was performed locating the right hypogastric artery.  The ipsilateral limb was proximal to this, and therefore it was fully deployed.  Next, a Q-50 balloon was used to mold the proximal and distal attachment sites as well as device overlap.  Completion Joetta Manners was performed which showed successful exclusion of aneurysm with no evidence of endoleak and preservation of  bilateral renal and hypogastric arteries.  However, I felt that the device had migrated distally and there was approximately 1.5 cm of aorta below the lowest renal artery.  I felt that a cuff was required.  A Gore 28 x 3.3 ox or extension was inserted and deployed landing just below the lowest right renal artery.  This was molded with a Q-50 balloon, adequate completion Angie Phillip Heal was performed which confirmed adequate placement of the cuff  and again, complete exclusion of aneurysm.  035 Bentson wires were then inserted.  The sheaths were then removed sequentially, securing the pro-glide devices for hemostasis.  An additional pro-glide was used for the right groin.  50 mg of protamine was then given.  Hemostasis was satisfactory.  The incision was closed with 4-0 Vicryl followed by Dermabond.  The patient excellent Doppler signals in his feet after the case.  There were no immediate complications.   Disposition:  To PACU in stable condition.   Theotis Burrow, M.D. [facility] [place] Office: [phone] Pager:  [phone]

## 2015-08-11 NOTE — Anesthesia Preprocedure Evaluation (Addendum)
Anesthesia Evaluation  Patient identified by MRN, date of birth, ID band Patient awake    Reviewed: Allergy & Precautions, NPO status , Patient's Chart, lab work & pertinent test results  History of Anesthesia Complications Negative for: history of anesthetic complications  Airway Mallampati: I  TM Distance: >3 FB Neck ROM: Full    Dental  (+) Edentulous Lower, Edentulous Upper   Pulmonary COPD, Current Smoker,  Small cell lung ca:  Chemo, XRT   breath sounds clear to auscultation       Cardiovascular hypertension, Pt. on medications and Pt. on home beta blockers + CAD, + Past MI, + CABG and + Peripheral Vascular Disease (AAA)   Rhythm:Regular Rate:Normal  3/17 ECHO:  EF 16%, grade 2 diastolic dysfunction, mild MR   Neuro/Psych Anxiety negative neurological ROS     GI/Hepatic Neg liver ROS, H/o pancreatitis/ mets to pancreas   Endo/Other  Morbid obesity  Renal/GU negative Renal ROS     Musculoskeletal  (+) Arthritis ,   Abdominal (+) + obese,   Peds  Hematology negative hematology ROS (+)   Anesthesia Other Findings   Reproductive/Obstetrics                           Anesthesia Physical Anesthesia Plan  ASA: III  Anesthesia Plan: General   Post-op Pain Management:    Induction: Intravenous  Airway Management Planned: Oral ETT  Additional Equipment: Arterial line  Intra-op Plan:   Post-operative Plan: Extubation in OR  Informed Consent: I have reviewed the patients History and Physical, chart, labs and discussed the procedure including the risks, benefits and alternatives for the proposed anesthesia with the patient or authorized representative who has indicated his/her understanding and acceptance.     Plan Discussed with: CRNA and Surgeon  Anesthesia Plan Comments: (Plan routine monitors, A-line, GETA)        Anesthesia Quick Evaluation

## 2015-08-11 NOTE — Anesthesia Postprocedure Evaluation (Signed)
Anesthesia Post Note  Patient: [name]  Procedure(s) Performed: Procedure(s) (LRB): ABDOMINAL AORTIC ENDOVASCULAR STENT GRAFT (N/A)  Patient location during evaluation: PACU Anesthesia Type: General Level of consciousness: awake and alert, oriented and patient cooperative Pain management: pain level controlled Vital Signs Assessment: post-procedure vital signs reviewed and stable Respiratory status: spontaneous breathing, nonlabored ventilation and respiratory function stable Cardiovascular status: blood pressure returned to baseline and stable Postop Assessment: no signs of nausea or vomiting Anesthetic complications: no    Last Vitals:  Filed Vitals:   08/11/15 1403 08/11/15 1418  BP: 96/66 97/65  Pulse: 72 77  Temp:    Resp: 15 16    Last Pain: There were no vitals filed for this visit.               Midge Minium

## 2015-08-11 NOTE — Transfer of Care (Signed)
Immediate Anesthesia Transfer of Care Note  Patient: [name]  Procedure(s) Performed: Procedure(s): ABDOMINAL AORTIC ENDOVASCULAR STENT GRAFT (N/A)  Patient Location: PACU  Anesthesia Type:General  Level of Consciousness: awake, alert , oriented and patient cooperative  Airway & Oxygen Therapy: Patient Spontanous Breathing and Patient connected to nasal cannula oxygen  Post-op Assessment: Report given to RN, Post -op Vital signs reviewed and stable and Patient moving all extremities X 4  Post vital signs: Reviewed and stable  Last Vitals:  Filed Vitals:   08/11/15 0915  BP: 118/85  Pulse: 91  Temp: 36.9 C  Resp: 20    Complications: No apparent anesthesia complications

## 2015-08-11 NOTE — Progress Notes (Signed)
  [facility] Day [facility] Note  Subjective: Seen in PACU. No complaints.   Filed Vitals:   08/11/15 1519 08/11/15 1549  BP: 105/72 101/68  Pulse: 72 78  Temp:    Resp: 12 15    Groins soft without hematoma.  Palpable left DP pulse.  Dopplerable right DP and PT.   Assessment/Plan:  This is a 55 y.o. male who is s/p EVAR   Stable post-operatively. To 3S when bed available.    Virgina Jock, [place] Pager: [phone] 08/11/2015 4:01 PM

## 2015-08-11 NOTE — Anesthesia Procedure Notes (Signed)
Procedure Name: Intubation Date/Time: 08/11/2015 11:02 AM Performed by: Lance Coon Pre-anesthesia Checklist: Patient identified, Emergency Drugs available, Suction available, Patient being monitored and Timeout performed Patient Re-evaluated:Patient Re-evaluated prior to inductionOxygen Delivery Method: Circle system utilized Preoxygenation: Pre-oxygenation with 100% oxygen Intubation Type: IV induction Laryngoscope Size: Miller and 2 Grade View: Grade I Tube type: Oral Tube size: 7.5 mm Number of attempts: 1 Airway Equipment and Method: Stylet Placement Confirmation: ETT inserted through vocal cords under direct vision,  positive ETCO2 and breath sounds checked- equal and bilateral Secured at: 22 cm Tube secured with: Tape Dental Injury: Teeth and Oropharynx as per pre-operative assessment

## 2015-08-12 ENCOUNTER — Other Ambulatory Visit (HOSPITAL_COMMUNITY): Payer: Self-pay | Admitting: *Deleted

## 2015-08-12 ENCOUNTER — Telehealth: Payer: Self-pay | Admitting: Surgery

## 2015-08-12 ENCOUNTER — Encounter (HOSPITAL_COMMUNITY): Payer: Self-pay | Admitting: Surgery

## 2015-08-12 DIAGNOSIS — C3491 Malignant neoplasm of unspecified part of right bronchus or lung: Secondary | ICD-10-CM

## 2015-08-12 LAB — CBC
HEMATOCRIT: 30.9 % — AB (ref 39.0–52.0)
HEMOGLOBIN: 10.4 g/dL — AB (ref 13.0–17.0)
MCH: 31.6 pg (ref 26.0–34.0)
MCHC: 33.7 g/dL (ref 30.0–36.0)
MCV: 93.9 fL (ref 78.0–100.0)
Platelets: 102 10*3/uL — ABNORMAL LOW (ref 150–400)
RBC: 3.29 MIL/uL — AB (ref 4.22–5.81)
RDW: 13.3 % (ref 11.5–15.5)
WBC: 5.4 10*3/uL (ref 4.0–10.5)

## 2015-08-12 LAB — BASIC METABOLIC PANEL
ANION GAP: 10 (ref 5–15)
BUN: 8 mg/dL (ref 6–20)
CHLORIDE: 99 mmol/L — AB (ref 101–111)
CO2: 26 mmol/L (ref 22–32)
Calcium: 8.5 mg/dL — ABNORMAL LOW (ref 8.9–10.3)
Creatinine, Ser: 1.17 mg/dL (ref 0.61–1.24)
GFR calc non Af Amer: >60 mL/min (ref >60)
Glucose, Bld: 101 mg/dL — ABNORMAL HIGH (ref 65–99)
POTASSIUM: 3.7 mmol/L (ref 3.5–5.1)
SODIUM: 135 mmol/L (ref 135–145)

## 2015-08-12 NOTE — Progress Notes (Signed)
[facility] [place]  Subjective  - Doing well ambulating, voided and ready to go home.   Objective 108/78 95 98.7 F (37.1 C) (Oral) 19 92%  Intake/Output Summary (Last 24 hours) at 08/12/15 0736 Last data filed at 08/12/15 0719  Gross per 24 hour  Intake 4358.75 ml  Output   2500 ml  Net 1858.75 ml    Palpable left DP bilateral feet warm well perfused Groin soft without hematoma Heart RRR Lungs non labored breathing  Assessment/Planning: POD #! EVAR  Stable disposition plan discharge home after breakfast Cr WNL  Theda Sers [facility] 08/12/2015 7:36 AM --  Laboratory Lab Results:  Recent Labs  08/11/15 1345 08/12/15 0430  WBC 3.2* 5.4  HGB 11.1* 10.4*  HCT 33.2* 30.9*  PLT 119* 102*   BMET  Recent Labs  08/11/15 1345 08/12/15 0430  NA 137 135  K 4.0 3.7  CL 101 99*  CO2 26 26  GLUCOSE 103* 101*  BUN 11 8  CREATININE 1.16 1.17  CALCIUM 8.9 8.5*    COAG Lab Results  Component Value Date   INR 1.27 08/11/2015   INR 0.95 08/08/2015   INR 0.91 06/22/2015   No results found for: PTT

## 2015-08-12 NOTE — Progress Notes (Signed)
Patient ambulated approximately 367f around unit on room air with no aide.  VS remained WNL.  Pt now sitting in chair and denies pain or SOB.  Will continue to monitor.

## 2015-08-12 NOTE — Telephone Encounter (Signed)
-----   Message from Mena Goes, RN sent at 08/12/2015 12:01 PM EDT ----- Regarding: RE: schedule Unfortunately, ours needs to be a CTA if possible. We are checking on the EVAR.  ----- Message -----    From: Georgiann Mccoy    Sent: 08/12/2015   9:57 AM      To: Mena Goes, RN Subject: RE: schedule                                   This pt is already having a CT ABD/PEL w/ contrast in 4 weeks ordered by another md.  Would this be sufficient?  ----- Message -----    From: Mena Goes, RN    Sent: 08/11/2015   3:17 PM      To: [facility] Admin Pool Subject: schedule                                         ----- Message -----    From: Alvia Grove, PA-C    Sent: 08/11/2015  12:50 PM      To: Vvs Charge Pool  S/p EVAR 08/11/15  F/u with Dr. Trula Slade in 4 weeks with CTA  Thanks Maudie Mercury

## 2015-08-12 NOTE — Telephone Encounter (Signed)
Dr. Frankey Poot office was doing at CT Abd/pel, spoke to their office and they are changing to and Angio. It will be at [facility] on 5/22. Spoke w/ pt's daughter to sch MD appt for 6/5 at 4.

## 2015-08-12 NOTE — Progress Notes (Signed)
Pt discharging home, spouse at bedside to take pt home. Reviewed discharge orders including medications, signs and symptoms of infection including when and how to call the doctor. Reviewed follow up appts. Pt and spouse verbalize understanding. Pt taken downstairs to car in wheelchair. Consuelo Pandy RN

## 2015-08-18 NOTE — Discharge Summary (Signed)
[facility] Discharge Summary   [name] ID:  [name] MRN: [id number] DOB/AGE: February 11, 1961 55 y.o.  Admit date: 08/11/2015 Discharge date: 08/12/2015 Date of Surgery: 08/11/2015 Surgeon: Surgeon(s): Serafina Mitchell, MD  Admission Diagnosis: Abdominal Aortic Aneurysm  I71.4  Discharge Diagnoses:  Abdominal Aortic Aneurysm  I71.4  Secondary Diagnoses: Past Medical History  Diagnosis Date  . Coronary artery disease 2002    Lesion in LAD s/p angioplasty; s/p CABG x 4 Sept 2013  . Myocardial infarct ([place]) 1999, 2002  . Hyperlipidemia   . Essential hypertension, benign   . 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 COPD (chronic obstructive pulmonary disease) ([place])   . Arthritis   . Pancreatitis   . AAA (abdominal aortic aneurysm) ([place])     Procedure(s): ABDOMINAL AORTIC ENDOVASCULAR STENT GRAFT  Discharged Condition: good  XTG:GYIR is a 55 year old gentleman who is back for follow-up of his abdominal aortic aneurysm. This was initially detected when he underwent a PET scan for his lung cancer workup. He was diagnosed with Stage IV small cell lung cancer. He is undergone chemotherapy with a good response.  [name] as a history of emergent CABG. He is a long-term smoker and heavy alcohol user. He suffers of hypercholesterolemia which is managed with a statin. He is medically managed for hypertension. His PET scan indicated a 5.5-5.7 infrarenal aneurysm. Because of his newly diagnosed cancer I felt that we should observe him for a while with short follow-up. He is here today with a repeat CT scan. He has no complaints regarding abdominal pain or back pain.     Hospital Course:  [name] is a 55 y.o. male is S/P  Procedure(s): ABDOMINAL AORTIC ENDOVASCULAR STENT GRAFT  POD1 Palpable left DP bilateral feet warm well perfused Groin soft without hematoma Heart RRR Lungs non labored  breathing  Assessment/Planning: POD #! EVAR  Stable disposition plan discharge home after breakfast Cr WNL   Significant Diagnostic Studies: CBC Lab Results  Component Value Date   WBC 5.4 08/12/2015   HGB 10.4* 08/12/2015   HCT 30.9* 08/12/2015   MCV 93.9 08/12/2015   PLT 102* 08/12/2015    BMET    Component Value Date/Time   NA 135 08/12/2015 0430   K 3.7 08/12/2015 0430   CL 99* 08/12/2015 0430   CO2 26 08/12/2015 0430   GLUCOSE 101* 08/12/2015 0430   BUN 8 08/12/2015 0430   CREATININE 1.17 08/12/2015 0430   CREATININE 0.99 01/27/2015 1450   CALCIUM 8.5* 08/12/2015 0430   GFRNONAA >60 08/12/2015 0430   GFRNONAA 87 01/27/2015 1450   GFRAA >60 08/12/2015 0430   GFRAA >89 01/27/2015 1450   COAG Lab Results  Component Value Date   INR 1.27 08/11/2015   INR 0.95 08/08/2015   INR 0.91 06/22/2015     Disposition:  Discharge to :Home Discharge Instructions    Call MD for:  redness, tenderness, or signs of infection (pain, swelling, bleeding, redness, odor or green/yellow discharge around incision site)    Complete by:  As directed      Call MD for:  severe or increased pain, loss or decreased feeling  in affected limb(s)    Complete by:  As directed      Call MD for:  temperature >100.5    Complete by:  As directed      Discharge [name]    Complete by:  As directed   Discharge  pt to home     Discharge wound care:    Complete by:  As directed   Wash wounds daily with soap and water and pat dry. Do not apply any creams or ointments on your incisions. You do not have to reapply a dressing.     Driving Restrictions    Complete by:  As directed   No driving for 2 weeks     Increase activity slowly    Complete by:  As directed   Walk with assistance use walker or cane as needed     Lifting restrictions    Complete by:  As directed   No lifting for 4 weeks     Resume previous diet    Complete by:  As directed             Medication List    STOP  taking these medications        mupirocin ointment 2 %  Commonly known as:  BACTROBAN     sucralfate 1 g tablet  Commonly known as:  CARAFATE      TAKE these medications        amLODipine 5 MG tablet  Commonly known as:  NORVASC  TAKE ONE (1) TABLET BY MOUTH EVERY DAY     aspirin EC 81 MG tablet  Take 81 mg by mouth daily.     carvedilol 6.25 MG tablet  Commonly known as:  COREG  TAKE ONE TABLET BY MOUTH TWICE A DAY WITH A MEAL     oxyCODONE-acetaminophen 5-325 MG tablet  Commonly known as:  ROXICET  Take 1 tablet by mouth every 6 (six) hours as needed.     pravastatin 40 MG tablet  Commonly known as:  PRAVACHOL  TAKE ONE (1) TABLET BY MOUTH EVERY DAY       Verbal and written Discharge instructions given to [name]. Wound care per Discharge AVS     Follow-up Information    Follow up with Annamarie Major, MD In 4 weeks.   Specialties:  Vascular Surgery, Cardiology   Why:  Our office will call you to arrange an appointment (sent)   Contact information:   [address] [place] [address] [phone]       Signed: Laurence Slate [facility] 08/18/2015, 9:27 AM  - For VQI Registry use --- Instructions: Press F2 to tab through selections.  Delete question if not applicable.   Post-op:  Time to Extubation: [x ] In OR, '[ ]'$  < 12 hrs, '[ ]'$  12-24 hrs, '[ ]'$  >=24 hrs Vasopressors Req. Post-op: No MI: '[ ]'$  No, '[ ]'$  Troponin only, '[ ]'$  EKG or Clinical New Arrhythmia: No CHF: No ICU Stay: 1 days Transfusion: No  If yes, 0 units given  Complications: Resp failure: [x ] none, '[ ]'$  Pneumonia, '[ ]'$  Ventilator Chg in renal function: [x ] none, '[ ]'$  Inc. Cr > 0.5, '[ ]'$  Temp. Dialysis, '[ ]'$  Permanent dialysis Leg ischemia: [x ] No, '[ ]'$  Yes, no Surgery needed, '[ ]'$  Yes, Surgery needed, '[ ]'$  Amputation Bowel ischemia: [x ] No, '[ ]'$  Medical Rx, '[ ]'$  Surgical Rx Wound complication: [x ] No, '[ ]'$  Superficial separation/infection, '[ ]'$  Return to OR Return to OR: No  Return to OR for bleeding:  No Stroke: '[ ]'$  None, '[ ]'$  Minor, '[ ]'$  Major  Discharge medications: Statin use:  Yes ASA use:  Yes Plavix use:  No  for medical reason   Beta blocker use:  Yes

## 2015-08-22 ENCOUNTER — Telehealth (HOSPITAL_COMMUNITY): Payer: Self-pay | Admitting: Emergency Medicine

## 2015-08-22 ENCOUNTER — Other Ambulatory Visit: Payer: Self-pay

## 2015-08-22 NOTE — Telephone Encounter (Signed)
Wife called and was asking about scans.  We are going to do the angio CTs, if these scans do not show [place] what we want to know then we will order additional imaging when he comes in for his appt

## 2015-09-08 ENCOUNTER — Other Ambulatory Visit: Payer: Self-pay | Admitting: Radiation Oncology

## 2015-09-08 DIAGNOSIS — C3411 Malignant neoplasm of upper lobe, right bronchus or lung: Secondary | ICD-10-CM

## 2015-09-12 ENCOUNTER — Other Ambulatory Visit (HOSPITAL_COMMUNITY): Payer: BLUE CROSS/BLUE SHIELD

## 2015-09-12 ENCOUNTER — Ambulatory Visit (HOSPITAL_COMMUNITY)
Admission: RE | Admit: 2015-09-12 | Discharge: 2015-09-12 | Disposition: A | Discharge status: Home / Self Care | Payer: BLUE CROSS/BLUE SHIELD | Source: Ambulatory Visit | Attending: Surgery | Admitting: Surgery

## 2015-09-12 DIAGNOSIS — Z95828 Presence of other vascular implants and grafts: Secondary | ICD-10-CM | POA: Diagnosis present

## 2015-09-12 DIAGNOSIS — I714 Abdominal aortic aneurysm, without rupture, unspecified: Secondary | ICD-10-CM

## 2015-09-12 MED ORDER — IOPAMIDOL (ISOVUE-370) INJECTION 76%
100.0000 mL | Freq: Once | INTRAVENOUS | Status: AC | PRN
Start: 2015-09-12 — End: 2015-09-12
  Administered 2015-09-12: 100 mL via INTRAVENOUS

## 2015-09-13 ENCOUNTER — Ambulatory Visit (HOSPITAL_COMMUNITY)
Admission: RE | Admit: 2015-09-13 | Discharge: 2015-09-13 | Disposition: A | Discharge status: Home / Self Care | Payer: BLUE CROSS/BLUE SHIELD | Source: Ambulatory Visit | Attending: Hematology & Oncology | Admitting: Hematology & Oncology

## 2015-09-13 DIAGNOSIS — C3491 Malignant neoplasm of unspecified part of right bronchus or lung: Secondary | ICD-10-CM | POA: Insufficient documentation

## 2015-09-13 DIAGNOSIS — R911 Solitary pulmonary nodule: Secondary | ICD-10-CM | POA: Insufficient documentation

## 2015-09-13 DIAGNOSIS — I2510 Atherosclerotic heart disease of native coronary artery without angina pectoris: Secondary | ICD-10-CM | POA: Insufficient documentation

## 2015-09-13 DIAGNOSIS — Z951 Presence of aortocoronary bypass graft: Secondary | ICD-10-CM | POA: Diagnosis not present

## 2015-09-13 DIAGNOSIS — J439 Emphysema, unspecified: Secondary | ICD-10-CM | POA: Diagnosis not present

## 2015-09-13 MED ORDER — IOPAMIDOL (ISOVUE-300) INJECTION 61%
100.0000 mL | Freq: Once | INTRAVENOUS | Status: AC | PRN
  Administered 2015-09-13: 100 mL via INTRAVENOUS

## 2015-09-14 ENCOUNTER — Ambulatory Visit (HOSPITAL_COMMUNITY)
Admission: RE | Admit: 2015-09-14 | Discharge: 2015-09-14 | Disposition: A | Discharge status: Home / Self Care | Payer: BLUE CROSS/BLUE SHIELD | Source: Ambulatory Visit | Attending: Hematology & Oncology | Admitting: Hematology & Oncology

## 2015-09-14 ENCOUNTER — Encounter (HOSPITAL_COMMUNITY): Payer: Self-pay | Admitting: Hematology & Oncology

## 2015-09-14 ENCOUNTER — Encounter (HOSPITAL_COMMUNITY): Payer: BLUE CROSS/BLUE SHIELD

## 2015-09-14 ENCOUNTER — Encounter (HOSPITAL_COMMUNITY): Payer: BLUE CROSS/BLUE SHIELD | Attending: Hematology & Oncology | Admitting: Hematology & Oncology

## 2015-09-14 VITALS — BP 136/79 | HR 89 | Temp 97.8°F | Resp 20 | Wt 222.4 lb

## 2015-09-14 DIAGNOSIS — M79661 Pain in right lower leg: Secondary | ICD-10-CM | POA: Diagnosis not present

## 2015-09-14 DIAGNOSIS — C3411 Malignant neoplasm of upper lobe, right bronchus or lung: Secondary | ICD-10-CM | POA: Diagnosis not present

## 2015-09-14 DIAGNOSIS — D6481 Anemia due to antineoplastic chemotherapy: Secondary | ICD-10-CM

## 2015-09-14 DIAGNOSIS — Z0000 Encounter for general adult medical examination without abnormal findings: Secondary | ICD-10-CM | POA: Insufficient documentation

## 2015-09-14 DIAGNOSIS — C3491 Malignant neoplasm of unspecified part of right bronchus or lung: Secondary | ICD-10-CM

## 2015-09-14 DIAGNOSIS — K8689 Other specified diseases of pancreas: Secondary | ICD-10-CM

## 2015-09-14 DIAGNOSIS — K869 Disease of pancreas, unspecified: Secondary | ICD-10-CM | POA: Insufficient documentation

## 2015-09-14 DIAGNOSIS — R918 Other nonspecific abnormal finding of lung field: Secondary | ICD-10-CM | POA: Diagnosis present

## 2015-09-14 DIAGNOSIS — Z720 Tobacco use: Secondary | ICD-10-CM

## 2015-09-14 LAB — COMPREHENSIVE METABOLIC PANEL
ALK PHOS: 59 U/L (ref 38–126)
ALT: 13 U/L — AB (ref 17–63)
AST: 17 U/L (ref 15–41)
Albumin: 4.3 g/dL (ref 3.5–5.0)
Anion gap: 10 (ref 5–15)
BILIRUBIN TOTAL: 0.6 mg/dL (ref 0.3–1.2)
BUN: 9 mg/dL (ref 6–20)
CALCIUM: 9 mg/dL (ref 8.9–10.3)
CHLORIDE: 97 mmol/L — AB (ref 101–111)
CO2: 27 mmol/L (ref 22–32)
Creatinine, Ser: 1.11 mg/dL (ref 0.61–1.24)
Glucose, Bld: 110 mg/dL — ABNORMAL HIGH (ref 65–99)
Potassium: 4 mmol/L (ref 3.5–5.1)
Sodium: 134 mmol/L — ABNORMAL LOW (ref 135–145)
TOTAL PROTEIN: 7.9 g/dL (ref 6.5–8.1)

## 2015-09-14 LAB — CBC WITH DIFFERENTIAL/PLATELET
Basophils Absolute: 0 10*3/uL (ref 0.0–0.1)
Basophils Relative: 0 %
EOS PCT: 5 %
Eosinophils Absolute: 0.2 10*3/uL (ref 0.0–0.7)
HCT: 34.1 % — ABNORMAL LOW (ref 39.0–52.0)
Hemoglobin: 11.5 g/dL — ABNORMAL LOW (ref 13.0–17.0)
LYMPHS ABS: 1 10*3/uL (ref 0.7–4.0)
LYMPHS PCT: 21 %
MCH: 31.3 pg (ref 26.0–34.0)
MCHC: 33.7 g/dL (ref 30.0–36.0)
MCV: 92.7 fL (ref 78.0–100.0)
Monocytes Absolute: 0.5 10*3/uL (ref 0.1–1.0)
Monocytes Relative: 11 %
Neutro Abs: 2.8 10*3/uL (ref 1.7–7.7)
Neutrophils Relative %: 63 %
PLATELETS: 163 10*3/uL (ref 150–400)
RBC: 3.68 MIL/uL — AB (ref 4.22–5.81)
RDW: 14.6 % (ref 11.5–15.5)
WBC: 4.6 10*3/uL (ref 4.0–10.5)
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
## 2015-09-14 NOTE — Patient Instructions (Addendum)
[place] at [facility] Discharge Instructions  RECOMMENDATIONS MADE BY THE CONSULTANT AND ANY TEST RESULTS WILL BE SENT TO YOUR REFERRING PHYSICIAN.  Return to clinic in 2 months after CT of chest abdomen and pelvis  Ultrasound right lower extremity today   Thank you for choosing [place] at [facility] to provide your oncology and hematology care.  To afford each patient quality time with our provider, please arrive at least 15 minutes before your scheduled appointment time.   Beginning January 23rd 2017 lab work for the [facility] will be done in the  Main lab at [facility] on 1st floor. If you have a lab appointment with the [facility] please come in thru the  Main Entrance and check in at the main information desk  You need to re-schedule your appointment should you arrive 10 or more minutes late.  We strive to give you quality time with our providers, and arriving late affects you and other patients whose appointments are after yours.  Also, if you no show three or more times for appointments you may be dismissed from the clinic at the providers discretion.     Again, thank you for choosing [facility].  Our hope is that these requests will decrease the amount of time that you wait before being seen by our physicians.       _____________________________________________________________  Should you have questions after your visit to [facility], please contact our office at [phone] between the hours of 8:30 a.m. and 4:30 p.m.  Voicemails left after 4:30 p.m. will not be returned until the following business day.  For prescription refill requests, have your pharmacy contact our office.         Resources For Cancer Patients and their Caregivers ? American Cancer Society: Can assist with transportation, wigs, general needs, runs Look Good Feel Better.        (772) 318-0982 ? Cancer  Care: Provides financial assistance, online support groups, medication/co-pay assistance.  1-800-813-HOPE (807)174-4443) ? [place] Assists [place] cancer patients and their families through emotional , educational and financial support.  343-146-9947 ? [place] DSS Where to apply for food stamps, Medicaid and utility assistance. 918-408-6774 ? [facility]: Transportation to medical appointments. 918-520-9720 ? Social Security Administration: May apply for disability if have a Stage IV cancer. 816-670-0626 (941)473-7100 ? [employer], Disability and Transit Services: Assists with nutrition, care and transit needs. [place] Support Programs: '@10RELATIVEDAYS'$ @ > Cancer Support Group  2nd Tuesday of the month 1pm-2pm, Journey Room  > Creative Journey  3rd Tuesday of the month 1130am-1pm, Journey Room  > Look Good Feel Better  1st Wednesday of the month 10am-12 noon, Journey Room (Call [place] to register [phone])

## 2015-09-15 ENCOUNTER — Telehealth (HOSPITAL_COMMUNITY): Payer: Self-pay | Admitting: *Deleted

## 2015-09-15 NOTE — Telephone Encounter (Signed)
Message left that U/S was negative for a blood clot on cell phone.

## 2015-09-20 ENCOUNTER — Encounter: Payer: Self-pay | Admitting: Surgery

## 2015-09-26 ENCOUNTER — Ambulatory Visit (INDEPENDENT_AMBULATORY_CARE_PROVIDER_SITE_OTHER): Payer: Self-pay | Admitting: Surgery

## 2015-09-26 ENCOUNTER — Encounter: Payer: Self-pay | Admitting: Surgery

## 2015-09-26 VITALS — BP 128/80 | HR 80 | Temp 97.3°F | Resp 18 | Ht 69.5 in | Wt 220.0 lb

## 2015-09-26 DIAGNOSIS — I714 Abdominal aortic aneurysm, without rupture, unspecified: Secondary | ICD-10-CM

## 2015-09-26 NOTE — Progress Notes (Signed)
   Patient name: [name] MRN: [id number] DOB: [birth year] Sex: male  REASON FOR VISIT: Postop  HPI: [name] is a 55 y.o. male who is back today for his first postoperative visit.  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  His postoperative course was uncomplicated.  His surgery was initially delayed because he is undergoing treatment for lung cancer.  He has no complaints today.  Current Outpatient Prescriptions  Medication Sig Dispense Refill  . amLODipine (NORVASC) 5 MG tablet TAKE ONE (1) TABLET BY MOUTH EVERY DAY (Patient taking differently: TAKE ONE (1) TABLET BY MOUTH AT BEDTIME) 90 tablet 6  . aspirin EC 81 MG tablet Take 81 mg by mouth daily.    . carvedilol (COREG) 6.25 MG tablet TAKE ONE TABLET BY MOUTH TWICE A DAY WITH A MEAL 180 tablet 3  . pravastatin (PRAVACHOL) 40 MG tablet TAKE ONE (1) TABLET BY MOUTH EVERY DAY (Patient taking differently: TAKE ONE (1) TABLET BY MOUTH AT BEDTIME) 90 tablet 6   No current facility-administered medications for this visit.    REVIEW OF SYSTEMS:  '[X]'$  denotes positive finding, '[ ]'$  denotes negative finding Cardiac  Comments:  Chest pain or chest pressure:    Shortness of breath upon exertion:    Short of breath when lying flat:    Irregular heart rhythm:    Constitutional    Fever or chills:      PHYSICAL EXAM: Filed Vitals:   09/26/15 1605  BP: 128/80  Pulse: 80  Temp: 97.3 F (36.3 C)  TempSrc: Oral  Resp: 18  Height: 5' 9.5" (1.765 m)  Weight: 220 lb (99.791 kg)  SpO2: 98%    GENERAL: The patient is a well-nourished male, in no acute distress. The vital signs are documented above. CARDIOVASCULAR: There is a regular rate and rhythm. PULMONARY: There is good air exchange bilaterally without wheezing or rales. Palpable femoral pulses  I have reviewed his CT scan which shows no evidence of endoleak.  The stent graft is in good  position  MEDICAL ISSUES: Status post endovascular repair of an abdominal aortic aneurysm.  The CT scan shows no evidence of endoleak and good positioning of the stent graft.  He will follow up in 6 months with an ultrasound.  Annamarie Major, MD [facility] [phone] Pager [phone]

## 2015-11-03 ENCOUNTER — Ambulatory Visit (HOSPITAL_COMMUNITY)
Admission: RE | Admit: 2015-11-03 | Discharge: 2015-11-03 | Disposition: A | Discharge status: Home / Self Care | Payer: Medicaid Other | Source: Ambulatory Visit | Attending: Radiation Oncology | Admitting: Radiation Oncology

## 2015-11-03 DIAGNOSIS — R930 Abnormal findings on diagnostic imaging of skull and head, not elsewhere classified: Secondary | ICD-10-CM | POA: Insufficient documentation

## 2015-11-03 DIAGNOSIS — C3411 Malignant neoplasm of upper lobe, right bronchus or lung: Secondary | ICD-10-CM | POA: Diagnosis not present

## 2015-11-03 LAB — POCT I-STAT CREATININE: Creatinine, Ser: 1.2 mg/dL (ref 0.61–1.24)

## 2015-11-03 MED ORDER — GADOBENATE DIMEGLUMINE 529 MG/ML IV SOLN
20.0000 mL | Freq: Once | INTRAVENOUS | Status: AC | PRN
  Administered 2015-11-03: 20 mL via INTRAVENOUS

## 2015-11-15 ENCOUNTER — Ambulatory Visit (HOSPITAL_COMMUNITY)
Admission: RE | Admit: 2015-11-15 | Discharge: 2015-11-15 | Disposition: A | Discharge status: Home / Self Care | Payer: Medicaid Other | Source: Ambulatory Visit | Attending: Hematology & Oncology | Admitting: Hematology & Oncology

## 2015-11-15 DIAGNOSIS — I2510 Atherosclerotic heart disease of native coronary artery without angina pectoris: Secondary | ICD-10-CM | POA: Insufficient documentation

## 2015-11-15 DIAGNOSIS — C3491 Malignant neoplasm of unspecified part of right bronchus or lung: Secondary | ICD-10-CM | POA: Diagnosis present

## 2015-11-15 DIAGNOSIS — I700 Atherosclerosis of aorta: Secondary | ICD-10-CM | POA: Diagnosis not present

## 2015-11-15 DIAGNOSIS — I714 Abdominal aortic aneurysm, without rupture: Secondary | ICD-10-CM | POA: Insufficient documentation

## 2015-11-15 DIAGNOSIS — Z951 Presence of aortocoronary bypass graft: Secondary | ICD-10-CM | POA: Diagnosis not present

## 2015-11-15 DIAGNOSIS — Z923 Personal history of irradiation: Secondary | ICD-10-CM | POA: Insufficient documentation

## 2015-11-15 DIAGNOSIS — E278 Other specified disorders of adrenal gland: Secondary | ICD-10-CM | POA: Diagnosis not present

## 2015-11-15 MED ORDER — IOPAMIDOL (ISOVUE-300) INJECTION 61%
100.0000 mL | Freq: Once | INTRAVENOUS | Status: AC | PRN
Start: 2015-11-15 — End: 2015-11-15
  Administered 2015-11-15: 100 mL via INTRAVENOUS

## 2015-11-16 ENCOUNTER — Other Ambulatory Visit (HOSPITAL_COMMUNITY): Payer: BLUE CROSS/BLUE SHIELD

## 2015-11-16 ENCOUNTER — Ambulatory Visit (HOSPITAL_COMMUNITY): Payer: BLUE CROSS/BLUE SHIELD | Admitting: Hematology & Oncology

## 2015-11-16 NOTE — Progress Notes (Signed)
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       01/18/2015 Imaging    CT chest- 8.7 x 8.7 cm R hilar mass encasing the R pulm artery and occluding the R upper lobe bronchus. Sm R pleural effusion; malignant or reactive. 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 34 mm x 26 mm mass in body of pancreas      01/21/2015 Imaging    MRI brain- No acute intracranial findings. No signs of metastatic disease. Minor white matter disease, stable and nonspecific.      01/26/2015 Pathology Results    Lung, biopsy, right upper lobe - SMALL CELL CARCINOMA.      01/26/2015 Pathology Results    Diagnosis FINE NEEDLE ASPIRATION: ENDOSCOPIC SPECIMEN A, EBUS LEVEL 7 NODE (SPECIMEN 1 OF 3 COLLECTED 01/26/2015) MALIGNANT CELLS PRESENT, CONSISTENT WITH SMALL CELL CARCINOMA.      01/26/2015 Pathology Results    WANG NEEDLE ASPIRATION, SPECIMEN B LEVEL 7 NODE (SPECIMEN 2 OF 3 COLLECTED  01/26/2015) MALIGNANT CELLS PRESENT, CONSISTENT WITH SMALL CELL CARCINOMA.      01/26/2015 Pathology Results    Diagnosis BRONCHIAL BRUSHING SPECIMEN C, RIGHT UPPER LOBE (SPECIMEN 3 OF 3 COLLECTED 01/26/2015) ATYPICAL CELLS PRESENT.      01/27/2015 PET scan    Large hypermetabolic right hilar mass with mediastinal invasion and infrahilar extension, maximum standard uptake value 13.0.  Pancreatic tail mass, approximately 4.5 cm in diameter. Abdominal aortic aneurysm, 5.8 cm in diameter.      02/10/2015 Pathology Results    Diagnosis FINE NEEDLE ASPIRATION, ENDOSCOPIC, PANCREAS TAIL (SPECIMEN 1 OF 1 COLLECTED 02/10/15): MALIGNANT CELLS CONSISTENT WITH METASTATIC SMALL CELL CARCINOMA.      02/14/2015 - 03/06/2015 Chemotherapy    Cisplatin/Etoposide      03/06/2015 Treatment Plan Change    Change systemic therapy to Carboplatin based      03/07/2015 - 05/10/2015 Chemotherapy    Carboplatin/Etoposide.      04/13/2015 Imaging    resolution of pancreatic metastases, decrease in size of R perihilar mss and RUL mass. Interval response to therapy      05/10/2015 -  Chemotherapy    Change to carboplatin/irinotecan for cycle #5 and #6 secondary to significant skin toxicity presumably from etoposide      07/12/2015 -  Radiation Therapy    25 Gy to whole brain and 30 Gy to R hilar region by Dr. MoLisbeth Renshaw     09/13/2015 Imaging    CT C/A/P unchanged from  2/28. persistent prominence of peribronchovascular intersitium throughout R lung, in perihilar region, no definitive findings to suggest progression of disease on exam      11/03/2015 Imaging    MRI brain 3 mm area of restricted diffusion R posterior temporal lobe without abnormal enhancement favor acute infarct, mild dural thickening enhancement over R hemisphere now present previously      11/15/2015 Imaging    CT C/A/P Interval development of 1.6 x 1.4 cm L adrenal nodule suspicious for metastatic lesion. No other sites of new metastatic  disease are noted elsewhere in C/A/P. Evolving postradiation changes in R lung       HISTORY OF PRESENTING ILLNESS:  [name] 55 y.o. male is here for further up of extensive stage SCLC.   [name] is accompanied by his wife. I personally reviewed and went over imaging results with the patient at length.  Continues to smoke 2 ppd. Denies abdominal pain. Denies SOB, CP.   He and his wife are upset about recent brain MRI. Area of mild dural thickening enhancement was noted. They both note that he is going to be presented at the brain tumor board. They are very concerned.   He denies new headaches or visual changes. No change in appetite. Energy is the same.  MEDICAL HISTORY:  Past Medical History:  Diagnosis Date  . AAA (abdominal aortic aneurysm) ([place])   . Arthritis   . COPD (chronic obstructive pulmonary disease) ([place])   . Coronary artery disease 2002   Lesion in LAD s/p angioplasty; s/p CABG x 4 Sept 2013  . Essential hypertension, benign   . Hyperlipidemia   . Myocardial infarct ([place]) 1999, 2002  . Pancreatitis   . Small cell lung cancer ([place])    Stage IIIB with extension the pancreas    SURGICAL HISTORY: Past Surgical History:  Procedure Laterality Date  . ABDOMINAL AORTIC ENDOVASCULAR STENT GRAFT N/A 08/11/2015   Procedure: ABDOMINAL AORTIC ENDOVASCULAR STENT GRAFT;  Surgeon: Serafina Mitchell, MD;  Location: [facility];  Service: Vascular;  Laterality: N/A;  . [place], 2002  . CORONARY ARTERY BYPASS GRAFT  01/02/2012   Procedure: CORONARY ARTERY BYPASS GRAFTING (CABG);  Surgeon: [name], MD;  Location: [facility];  Service: Open Heart Surgery;  Laterality: N/A;  times three using left internal mammary artery and right endoscopically harvested saphenous vein  . EUS N/A 02/10/2015   Procedure: UPPER ENDOSCOPIC ULTRASOUND (EUS) LINEAR;  Surgeon: [name], MD;  Location: [facility] ENDOSCOPY;  Service: Endoscopy;  Laterality: N/A;  . KNEE SURGERY Left      ACL repair  . LEFT HEART CATHETERIZATION WITH CORONARY ANGIOGRAM N/A 01/01/2012   Procedure: LEFT HEART CATHETERIZATION WITH CORONARY ANGIOGRAM;  Surgeon: Hillary Bow, MD;  Location: [facility] CATH LAB;  Service: Cardiovascular;  Laterality: N/A;  . LUNG BIOPSY N/A 01/26/2015   Procedure: LUNG BIOPSY;  Surgeon: [name], MD;  Location: [facility];  Service: Thoracic;  Laterality: N/A;  . LYMPH NODE BIOPSY N/A 01/26/2015   Procedure: LYMPH NODE BIOPSY;  Surgeon: [name], MD;  Location: [facility];  Service: Thoracic;  Laterality: N/A;  . MULTIPLE EXTRACTIONS WITH ALVEOLOPLASTY  02/21/2012   Procedure: MULTIPLE EXTRACION WITH ALVEOLOPLASTY;  Surgeon: Lenn Cal, DDS;  Location: [facility] ORS;  Service: Oral Surgery;  Laterality: N/A;  Extaction of tooth #'s 4,5,6,7,10,11,12,13,14,15,19,20,21,22,23,24,25,   . PORTACATH PLACEMENT Left 02/01/2015   Procedure: ATTEMPTED INSERTION OF PORT-A-CATH;  Surgeon: [name], MD;  Location:  [facility] OR;  Service: Thoracic;  Laterality: Left;  . TEE WITHOUT CARDIOVERSION  01/02/2012   Procedure: TRANSESOPHAGEAL ECHOCARDIOGRAM (TEE);  Surgeon: [name], MD;  Location: [facility];  Service: Open Heart Surgery;  Laterality: N/A;  . VIDEO BRONCHOSCOPY WITH ENDOBRONCHIAL ULTRASOUND N/A 01/26/2015   Procedure: VIDEO BRONCHOSCOPY WITH ENDOBRONCHIAL ULTRASOUND;  Surgeon: [name], MD;  Location: [facility];  Service: Thoracic;  Laterality: N/A;    SOCIAL HISTORY: Social History   Social History  . Marital status: Married    Spouse name: N/A  . Number of children: 5  . Years of education: N/A   Occupational History  . Works in [place]  . Smoking status: Current Every Day Smoker    Packs/day: 1.50    Years: 35.00    Types: Cigarettes    Start date: 06/30/1976  . Smokeless tobacco: Never Used  . Alcohol use No     Comment: Maybe a 12 pack/mth if that - very rare since 2013  . Drug use: No  . Sexual activity:  Yes   Other Topics Concern  . Not on file   Social History Narrative  . No narrative on file   They've been married 17 years, with five kids.  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 Heart disease Father   . Diabetes Father   . Hyperlipidemia Father   . Hypertension Father   . Hyperlipidemia Mother   . Hypertension Mother   . Stroke Mother   . Diabetes Paternal Grandmother   . Cancer Neg Hx    indicated that his mother is alive. He indicated that his father is alive. He indicated that his sister is alive. He indicated that his brother is alive. He indicated that his maternal grandmother is deceased. He indicated that his maternal grandfather is deceased. He indicated that his paternal grandmother is deceased. He indicated that his paternal grandfather is deceased. He indicated that the status of his neg hx is unknown.    His father's somewhere in [place]; his mother lives here in [place] and works at [employer] 76 at the end of this month She's got high blood pressure, high cholesterol, no strokes that they know of; is on blood thinners Has an aneurism at the base of the aorta where it goes into your stomach He knows his father's had high blood pressure and diabetes; Parents have been divorced since he was 3-4 Has half brothers / half sisters   ALLERGIES:  is allergic to lisinopril and tea.  MEDICATIONS:  Current Outpatient Prescriptions  Medication Sig Dispense Refill  . aspirin EC 81 MG tablet Take 81 mg by mouth daily.    Marland Kitchen amLODipine (NORVASC) 5 MG tablet TAKE ONE (1) TABLET BY MOUTH EVERY DAY 90 tablet 3  .  carvedilol (COREG) 6.25 MG tablet TAKE ONE TABLET BY MOUTH TWICE A DAY WITH A MEAL 180 tablet 1  . pravastatin (PRAVACHOL) 40 MG tablet TAKE ONE (1) TABLET BY MOUTH EVERY DAY 90 tablet 2   No current facility-administered medications for this  visit.     Review of Systems  Constitutional: Positive for malaise/fatigue. Negative for fever, chills, weight loss.  HENT: Negative for congestion, hearing loss, nosebleeds, sore throat and tinnitus.   Eyes: Negative for blurred vision, double vision, pain and discharge.  Respiratory: Negative for hemoptysis, cough, shortness of breath and wheezing.   Cardiovascular: Negative for palpitations, claudication, leg swelling and PND.  Gastrointestinal: Negative for heartburn, nausea, vomiting, abdominal pain, diarrhea, constipation, blood in stool and melena.  Genitourinary: Negative for dysuria, urgency, frequency and hematuria.  Musculoskeletal: Positive for joint pain and myalgias. Negative for falls.   Skin:  Negative for itching. Neurological: Negative for dizziness, tingling, tremors, sensory change, speech change, focal weakness, seizures, loss of consciousness, weakness and headaches.  Endo/Heme/Allergies: Does not bruise/bleed easily.  Psychiatric/Behavioral: Negative for depression, suicidal ideas, memory loss and substance abuse. The patient is not nervous/anxious and does not have insomnia.   All other systems reviewed and are negative.  14 point ROS was done and is otherwise as detailed above or in HPI   PHYSICAL EXAMINATION: ECOG PERFORMANCE STATUS: 0 - Asymptomatic   BP 127/80 (BP Location: Right Arm)   Pulse 91   Temp 98.1 F (36.7 C) (Oral)   Resp 18   Wt 215 lb 9.6 oz (97.8 kg)   SpO2 97%   BMI 31.38 kg/m   Physical Exam  Constitutional: He is oriented to person, place, and time and well-developed, well-nourished, and in no distress. Alopecia  HENT:  Head: Normocephalic and atraumatic.  Nose: Nose normal.  Mouth/Throat:  Oropharynx is clear and moist. No oropharyngeal exudate.  Dentures on top and bottom.  Eyes: Conjunctivae and EOM are normal. Pupils are equal, round, and reactive to light. Right eye exhibits no discharge. Left eye exhibits no discharge. No scleral icterus.  Neck: Normal range of motion. Neck supple. No tracheal deviation present. No thyromegaly present.  Cardiovascular: Normal rate, regular rhythm and normal heart sounds.  Exam reveals no gallop and no friction rub.   No murmur heard. Pulmonary/Chest: Effort normal. He has no wheezes. He has no rales.  Abdominal: Soft. Bowel sounds are normal. He exhibits no distension and no mass. There is no tenderness. There is no rebound and no guarding.  Musculoskeletal: Normal range of motion. He exhibits no edema.  Lymphadenopathy:    He has no cervical adenopathy.  Neurological: He is alert and oriented to person, place, and time. He has normal reflexes. No cranial nerve deficit. Gait normal. Coordination normal.  Skin: Skin is warm and dry. No rash noted. Psychiatric: Mood, memory, affect and judgment normal.  Nursing note and vitals reviewed.   LABORATORY DATA:  I have reviewed the data as listed.  CBC    Component Value Date/Time   WBC 4.1 11/17/2015 1134   RBC 3.91 (L) 11/17/2015 1134   HGB 12.0 (L) 11/17/2015 1134   HCT 35.1 (L) 11/17/2015 1134   PLT 144 (L) 11/17/2015 1134   MCV 89.8 11/17/2015 1134   MCH 30.7 11/17/2015 1134   MCHC 34.2 11/17/2015 1134   RDW 14.7 11/17/2015 1134   LYMPHSABS 1.1 11/17/2015 1134   MONOABS 0.3 11/17/2015 1134   EOSABS 0.1 11/17/2015 1134   BASOSABS 0.0 11/17/2015 1134   CMP     Component Value Date/Time   NA 132 (L) 11/17/2015 1134   K 3.8 11/17/2015 1134   CL 100 (L) 11/17/2015 1134   CO2 26 11/17/2015 1134   GLUCOSE 121 (H) 11/17/2015 1134   BUN 16  11/17/2015 1134   CREATININE 1.13 11/17/2015 1134   CREATININE 0.99 01/27/2015 1450   CALCIUM 8.9 11/17/2015 1134   PROT 7.9 11/17/2015  1134   ALBUMIN 4.2 11/17/2015 1134   AST 16 11/17/2015 1134   ALT 14 (L) 11/17/2015 1134   ALKPHOS 53 11/17/2015 1134   BILITOT 0.6 11/17/2015 1134   GFRNONAA >60 11/17/2015 1134   GFRNONAA 87 01/27/2015 1450   GFRAA >60 11/17/2015 1134   GFRAA >89 01/27/2015 1450    RADIOGRAPHIC STUDIES: I have personally reviewed the radiological images as listed and agreed with the findings in the report. Study Result   CLINICAL DATA:  55 year old male with history of small cell lung cancer. Followup study. EXAM: CT CHEST, ABDOMEN, AND PELVIS WITH CONTRAST TECHNIQUE: Multidetector CT imaging of the chest, abdomen and pelvis was performed following the standard protocol during bolus administration of intravenous contrast. CONTRAST:  161m ISOVUE-300 IOPAMIDOL (ISOVUE-300) INJECTION 61% COMPARISON:  CT the chest, abdomen and pelvis 09/13/2015. FINDINGS: CT CHEST FINDINGS Cardiovascular: Heart size is normal. There is no significant pericardial fluid, thickening or pericardial calcification. There is aortic atherosclerosis, as well as atherosclerosis of the great vessels of the mediastinum and the coronary arteries, including calcified atherosclerotic plaque in the left main, left anterior descending, left circumflex and right coronary arteries. Status post median sternotomy for CABG, including LIMA to the LAD. Mediastinum/Nodes: Prominent amorphous nodal tissue in the right hilar region is very similar to prior studies, without discrete measurable lymphadenopathy. Low right paratracheal lymph node measuring 9 mm in short axis. No left hilar adenopathy. Esophagus is unremarkable in appearance. No axillary lymphadenopathy. Lungs/Pleura: Again noted is a nodular area of architectural distortion in the periphery of the right upper lobe near the minor fissure (image 66 of series 7), which currently measures 2.8 x 2.1 cm (essentially unchanged). Adjacent areas of nodularity along the minor  fissure are also unchanged compared to the prior examination. New nodular areas of ground-glass attenuation are noted in the inferior aspect of the right upper lobe abutting the major fissure, largest of which measures 12 x 8 mm (image 62 of series 7). There is also some amorphous ground-glass attenuation and septal thickening in the central aspect of the right middle lobe (image 83 of series 7) which is more conspicuous in the prior examination. Both of these areas likely reflect evolving postradiation changes. Previously noted 4 mm right lower lobe pulmonary nodule is unchanged (image 105 of series 7). No new suspicious appearing pulmonary nodules or masses are otherwise noted. No acute consolidative airspace disease. No pleural effusions. Musculoskeletal: Median sternotomy wires. There are no aggressive appearing lytic or blastic lesions noted in the visualized portions of the skeleton. CT ABDOMEN AND PELVIS FINDINGS Hepatobiliary: No suspicious cystic or solid hepatic lesions. No intra or extrahepatic biliary ductal dilatation. Gallbladder is normal in appearance. Pancreas: No pancreatic mass. No pancreatic ductal dilatation. No pancreatic or peripancreatic fluid or inflammatory changes. Spleen: Unremarkable. Adrenals/Urinary Tract: New 1.4 x 1.6 cm left adrenal nodule, suspicious for a metastatic lesion. Right adrenal gland is normal in appearance. Bilateral kidneys are normal in appearance. No hydroureteronephrosis. Urinary bladder is normal in appearance. Stomach/Bowel: Normal appearance of the stomach. No pathologic dilatation of small bowel or colon. Normal appendix. Vascular/Lymphatic: Aortic atherosclerosis, including a 5.8 x 5.9 cm fusiform infrarenal abdominal aortic aneurysm which appears similar to slightly decreased compared to the prior examination (previously 6 cm in diameter) 09/12/2015. Patient is status post aorto bi-iliac bypass graft placement. No high  attenuation within the excluded aneurysm sac to strongly suggest endoleak on today's examination. Both limbs of the bypass graft appear to be widely patent. No lymphadenopathy noted in the abdomen or pelvis. Reproductive: Prostate gland and seminal vesicles are unremarkable in appearance. Other: No significant volume of ascites.  No pneumoperitoneum. Musculoskeletal: There are no aggressive appearing lytic or blastic lesions noted in the visualized portions of the skeleton. IMPRESSION: 1. Interval development of a 1.6 x 1.4 cm left adrenal nodule suspicious for a metastatic lesion. 2. No other sites of new metastatic disease are noted elsewhere in the chest, abdomen or pelvis. 3. Evolving postradiation changes in the right lung, as above. No definite evidence to suggest local recurrence of disease. Prominent nodal tissue in the right hilar and low right paratracheal region is very similar to the prior examination and nonspecific (Presumably treated disease). 4. Aortic atherosclerosis, including a 5.8 x 5.9 cm fusiform infrarenal abdominal aortic aneurysm status post aorto bi-iliac bypass graft placement. There is also left main and 3 vessel coronary artery disease in this patient with history of prior CABG including LIMA to the LAD. 5. Additional incidental findings, as above. Electronically Signed   By: Vinnie Langton M.D.   On: 11/15/2015 13:05   Study Result   CLINICAL DATA:  Malignant neoplasm lung. Headache. Rule out metastatic disease  EXAM: MRI HEAD WITHOUT AND WITH CONTRAST  TECHNIQUE: Multiplanar, multiecho pulse sequences of the brain and surrounding structures were obtained without and with intravenous contrast.  CONTRAST:  93m MULTIHANCE GADOBENATE DIMEGLUMINE 529 MG/ML IV SOLN  COMPARISON:  MRI head 06/23/2015  FINDINGS: Ventricle size is normal.  Cerebral volume is normal.  Interval development of small extra-axial fluid collection around the  right hemisphere. Postcontrast imaging demonstrates mild dural thickening and enhancement in this area. The dura on the left appears normal in thickness.  Small focus of increased signal in the right posterior temporal lobe on diffusion-weighted imaging. Probable restricted diffusion. This may represent a small acute infarction. This area does not show abnormal enhancement. Continued close followup is warranted to exclude metastatic disease. No other areas of restricted diffusion.  Mild chronic white matter changes are stable.  Postcontrast imaging demonstrates no enhancing brain lesions. Mild dural thickening enhancement on the right hemisphere noted.  Image quality degraded by mild motion.  IMPRESSION: 3 mm area of restricted diffusion right posterior temporal lobe without abnormal enhancement. Favor acute infarct. Short-term MRI follow-up is suggested to exclude metastatic disease.  Mild dural thickening enhancement over the right hemisphere which was not present previously. This could be due to metastatic disease and lumbar puncture suggested. If the patient has had prior head trauma, this could be related to a subdural effusion.   Electronically Signed   By: CFranchot GalloM.D.   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 Large hypermetabolic right hilar mass with mediastinal invasion and infrahilar extension  Hyponatremia c/w SIADH  EUS with  Dr. JacArdis Hughs  and pancreatic biopsy on 02/10/2015 c/w SCLC Anemia secondary to chemotherapy CT C/A/P 04/13/2015 with interval response to therapy. Presumed drug reaction to etoposide Treatment related anemia CR to first line therapy R calf pain ABNL MRI brain 11/03/2015 L adrenal nodule  CT imaging was reviewed in detail. I have recommended referral to XRT for radiation to the solitary adrenal metastases.  I spoke with neuroradiology and they feel at this point that findings on MRI are likely benign. I advised the patient however, that he will be presented at brain conference, if LP is recommended I advised him to strongly consider their recommendations or other recommendations that may be made.  He continues to smoke and has increased his smoking. I discussed with the patient again the importance of smoking cessation. We again discussed options to quit.   RTC 4 weeks.   All questions were answered. The patient knows to call the clinic with any problems, questions or concerns.  This document serves as a record of services personally performed by Ancil Linsey, MD. It was created on her behalf by Arlyce Harman, a trained medical scribe. The creation of this record is based on the scribe's personal observations and the provider's statements to them. This document has been checked and approved by the attending provider.  I have reviewed the above documentation for accuracy and completeness, and I agree with the above.  This note was electronically signed.    Kelby Fam. Kush Farabee, MD   11/17/2015

## 2015-11-17 ENCOUNTER — Encounter (HOSPITAL_COMMUNITY): Payer: Medicaid Other | Attending: Hematology & Oncology | Admitting: Hematology & Oncology

## 2015-11-17 ENCOUNTER — Encounter (HOSPITAL_COMMUNITY): Payer: Self-pay | Admitting: Hematology & Oncology

## 2015-11-17 ENCOUNTER — Other Ambulatory Visit: Payer: Self-pay | Admitting: Cardiology

## 2015-11-17 ENCOUNTER — Encounter (HOSPITAL_COMMUNITY): Payer: Medicaid Other

## 2015-11-17 ENCOUNTER — Encounter (HOSPITAL_COMMUNITY): Payer: Self-pay | Admitting: Lab

## 2015-11-17 VITALS — BP 127/80 | HR 91 | Temp 98.1°F | Resp 18 | Wt 215.6 lb

## 2015-11-17 DIAGNOSIS — R9089 Other abnormal findings on diagnostic imaging of central nervous system: Secondary | ICD-10-CM

## 2015-11-17 DIAGNOSIS — C3491 Malignant neoplasm of unspecified part of right bronchus or lung: Secondary | ICD-10-CM | POA: Insufficient documentation

## 2015-11-17 DIAGNOSIS — D630 Anemia in neoplastic disease: Secondary | ICD-10-CM | POA: Diagnosis not present

## 2015-11-17 DIAGNOSIS — K869 Disease of pancreas, unspecified: Secondary | ICD-10-CM | POA: Diagnosis not present

## 2015-11-17 DIAGNOSIS — E871 Hypo-osmolality and hyponatremia: Secondary | ICD-10-CM

## 2015-11-17 DIAGNOSIS — D6481 Anemia due to antineoplastic chemotherapy: Secondary | ICD-10-CM

## 2015-11-17 DIAGNOSIS — Z720 Tobacco use: Secondary | ICD-10-CM

## 2015-11-17 DIAGNOSIS — C7972 Secondary malignant neoplasm of left adrenal gland: Secondary | ICD-10-CM

## 2015-11-17 DIAGNOSIS — Z0000 Encounter for general adult medical examination without abnormal findings: Secondary | ICD-10-CM | POA: Diagnosis present

## 2015-11-17 DIAGNOSIS — E278 Other specified disorders of adrenal gland: Secondary | ICD-10-CM

## 2015-11-17 DIAGNOSIS — C3411 Malignant neoplasm of upper lobe, right bronchus or lung: Secondary | ICD-10-CM

## 2015-11-17 DIAGNOSIS — E279 Disorder of adrenal gland, unspecified: Secondary | ICD-10-CM

## 2015-11-17 DIAGNOSIS — R918 Other nonspecific abnormal finding of lung field: Secondary | ICD-10-CM | POA: Insufficient documentation

## 2015-11-17 LAB — CBC WITH DIFFERENTIAL/PLATELET
BASOS ABS: 0 10*3/uL (ref 0.0–0.1)
Basophils Relative: 0 %
EOS PCT: 3 %
Eosinophils Absolute: 0.1 10*3/uL (ref 0.0–0.7)
HCT: 35.1 % — ABNORMAL LOW (ref 39.0–52.0)
HEMOGLOBIN: 12 g/dL — AB (ref 13.0–17.0)
LYMPHS ABS: 1.1 10*3/uL (ref 0.7–4.0)
LYMPHS PCT: 27 %
MCH: 30.7 pg (ref 26.0–34.0)
MCHC: 34.2 g/dL (ref 30.0–36.0)
MCV: 89.8 fL (ref 78.0–100.0)
MONO ABS: 0.3 10*3/uL (ref 0.1–1.0)
MONOS PCT: 8 %
Neutro Abs: 2.5 10*3/uL (ref 1.7–7.7)
Neutrophils Relative %: 62 %
PLATELETS: 144 10*3/uL — AB (ref 150–400)
RBC: 3.91 MIL/uL — AB (ref 4.22–5.81)
RDW: 14.7 % (ref 11.5–15.5)
WBC: 4.1 10*3/uL (ref 4.0–10.5)

## 2015-11-17 LAB — COMPREHENSIVE METABOLIC PANEL
ALK PHOS: 53 U/L (ref 38–126)
ALT: 14 U/L — AB (ref 17–63)
AST: 16 U/L (ref 15–41)
Albumin: 4.2 g/dL (ref 3.5–5.0)
Anion gap: 6 (ref 5–15)
BUN: 16 mg/dL (ref 6–20)
CHLORIDE: 100 mmol/L — AB (ref 101–111)
CO2: 26 mmol/L (ref 22–32)
CREATININE: 1.13 mg/dL (ref 0.61–1.24)
Calcium: 8.9 mg/dL (ref 8.9–10.3)
GFR calc Af Amer: >60 mL/min (ref >60)
Glucose, Bld: 121 mg/dL — ABNORMAL HIGH (ref 65–99)
Potassium: 3.8 mmol/L (ref 3.5–5.1)
Sodium: 132 mmol/L — ABNORMAL LOW (ref 135–145)
Total Bilirubin: 0.6 mg/dL (ref 0.3–1.2)
Total Protein: 7.9 g/dL (ref 6.5–8.1)

## 2015-11-17 NOTE — Progress Notes (Unsigned)
Referral sent to [facility].  Records faxed on 7/27

## 2015-11-17 NOTE — Telephone Encounter (Signed)
Refill coreg complete

## 2015-11-17 NOTE — Patient Instructions (Signed)
[facility] Discharge Instructions  RECOMMENDATIONS MADE BY THE CONSULTANT AND ANY TEST RESULTS WILL BE SENT TO YOUR REFERRING PHYSICIAN.  You were seen by Dr. Whitney Muse today. Return to Clinic in 4 weeks to see Gershon Mussel Refer to [place], Dr. Lisbeth Renshaw .  Thank you for choosing [place] at [facility] to provide your oncology and hematology care.  To afford each patient quality time with our provider, please arrive at least 15 minutes before your scheduled appointment time.   Beginning January 23rd 2017 lab work for the [facility] will be done in the  Main lab at [facility] on 1st floor. If you have a lab appointment with the [facility] please come in thru the  Main Entrance and check in at the main information desk  You need to re-schedule your appointment should you arrive 10 or more minutes late.  We strive to give you quality time with our providers, and arriving late affects you and other patients whose appointments are after yours.  Also, if you no show three or more times for appointments you may be dismissed from the clinic at the providers discretion.     Again, thank you for choosing [facility].  Our hope is that these requests will decrease the amount of time that you wait before being seen by our physicians.       _____________________________________________________________  Should you have questions after your visit to [facility], please contact our office at [phone] between the hours of 8:30 a.m. and 4:30 p.m.  Voicemails left after 4:30 p.m. will not be returned until the following business day.  For prescription refill requests, have your pharmacy contact our office.         Resources For Cancer Patients and their Caregivers ? American [employer] Society: Can assist with transportation, wigs, general needs, runs Look Good Feel Better.        501-314-9225 ? Cancer Care: Provides  financial assistance, online support groups, medication/co-pay assistance.  1-800-813-HOPE (805)818-9950) ? [place] Assists [place] cancer patients and their families through emotional , educational and financial support.  646-619-0040 ? [place] DSS Where to apply for food stamps, Medicaid and utility assistance. 218-250-7601 ? [facility]: Transportation to medical appointments. 662-675-2621 ? Social Security Administration: May apply for disability if have a Stage IV cancer. (605)159-1829 952-297-8839 ? [employer], Disability and Transit Services: Assists with nutrition, care and transit needs. [place] Support Programs: '@10RELATIVEDAYS'$ @ > Cancer Support Group  2nd Tuesday of the month 1pm-2pm, Journey Room  > Creative Journey  3rd Tuesday of the month 1130am-1pm, Journey Room  > Look Good Feel Better  1st Wednesday of the month 10am-12 noon, Journey Room (Call [place] to register [phone])

## 2015-11-22 ENCOUNTER — Other Ambulatory Visit: Payer: Self-pay | Admitting: *Deleted

## 2015-11-22 DIAGNOSIS — I714 Abdominal aortic aneurysm, without rupture, unspecified: Secondary | ICD-10-CM

## 2015-11-22 DIAGNOSIS — Z48812 Encounter for surgical aftercare following surgery on the circulatory system: Secondary | ICD-10-CM

## 2015-11-28 ENCOUNTER — Other Ambulatory Visit: Payer: Self-pay | Admitting: Cardiology

## 2015-12-06 ENCOUNTER — Other Ambulatory Visit: Payer: Self-pay | Admitting: Cardiology

## 2015-12-10 ENCOUNTER — Encounter (HOSPITAL_COMMUNITY): Payer: Self-pay | Admitting: Hematology & Oncology

## 2015-12-15 ENCOUNTER — Ambulatory Visit (HOSPITAL_COMMUNITY): Payer: Medicaid Other | Admitting: Hematology & Oncology

## 2015-12-16 ENCOUNTER — Encounter (HOSPITAL_COMMUNITY): Payer: Self-pay | Admitting: Hematology & Oncology

## 2015-12-16 ENCOUNTER — Encounter (HOSPITAL_COMMUNITY): Payer: Medicaid Other | Attending: Hematology & Oncology | Admitting: Hematology & Oncology

## 2015-12-16 VITALS — BP 129/76 | HR 84 | Temp 98.0°F | Resp 16 | Wt 213.8 lb

## 2015-12-16 DIAGNOSIS — K869 Disease of pancreas, unspecified: Secondary | ICD-10-CM | POA: Diagnosis not present

## 2015-12-16 DIAGNOSIS — C7972 Secondary malignant neoplasm of left adrenal gland: Secondary | ICD-10-CM | POA: Diagnosis not present

## 2015-12-16 DIAGNOSIS — K8689 Other specified diseases of pancreas: Secondary | ICD-10-CM

## 2015-12-16 DIAGNOSIS — R918 Other nonspecific abnormal finding of lung field: Secondary | ICD-10-CM | POA: Insufficient documentation

## 2015-12-16 DIAGNOSIS — R9089 Other abnormal findings on diagnostic imaging of central nervous system: Secondary | ICD-10-CM

## 2015-12-16 DIAGNOSIS — E871 Hypo-osmolality and hyponatremia: Secondary | ICD-10-CM

## 2015-12-16 DIAGNOSIS — Z0000 Encounter for general adult medical examination without abnormal findings: Secondary | ICD-10-CM | POA: Insufficient documentation

## 2015-12-16 DIAGNOSIS — Z720 Tobacco use: Secondary | ICD-10-CM | POA: Diagnosis not present

## 2015-12-16 DIAGNOSIS — C3411 Malignant neoplasm of upper lobe, right bronchus or lung: Secondary | ICD-10-CM | POA: Diagnosis not present

## 2015-12-16 DIAGNOSIS — C3491 Malignant neoplasm of unspecified part of right bronchus or lung: Secondary | ICD-10-CM | POA: Insufficient documentation

## 2015-12-16 DIAGNOSIS — D630 Anemia in neoplastic disease: Secondary | ICD-10-CM

## 2015-12-16 DIAGNOSIS — D6481 Anemia due to antineoplastic chemotherapy: Secondary | ICD-10-CM

## 2015-12-16 NOTE — Progress Notes (Signed)
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       01/18/2015 Imaging    CT chest- 8.7 x 8.7 cm R hilar mass encasing the R pulm artery and occluding the R upper lobe bronchus. Sm R pleural effusion; malignant or reactive. 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 34 mm x 26 mm mass in body of pancreas      01/21/2015 Imaging    MRI brain- No acute intracranial findings. No signs of metastatic disease. Minor white matter disease, stable and nonspecific.      01/26/2015 Pathology Results    Lung, biopsy, right upper lobe - SMALL CELL CARCINOMA.      01/26/2015 Pathology Results    Diagnosis FINE NEEDLE ASPIRATION: ENDOSCOPIC SPECIMEN A, EBUS LEVEL 7 NODE (SPECIMEN 1 OF 3 COLLECTED 01/26/2015) MALIGNANT CELLS PRESENT, CONSISTENT WITH SMALL CELL CARCINOMA.      01/26/2015 Pathology Results    WANG NEEDLE ASPIRATION, SPECIMEN B LEVEL 7 NODE (SPECIMEN 2 OF 3 COLLECTED  01/26/2015) MALIGNANT CELLS PRESENT, CONSISTENT WITH SMALL CELL CARCINOMA.      01/26/2015 Pathology Results    Diagnosis BRONCHIAL BRUSHING SPECIMEN C, RIGHT UPPER LOBE (SPECIMEN 3 OF 3 COLLECTED 01/26/2015) ATYPICAL CELLS PRESENT.      01/27/2015 PET scan    Large hypermetabolic right hilar mass with mediastinal invasion and infrahilar extension, maximum standard uptake value 13.0.  Pancreatic tail mass, approximately 4.5 cm in diameter. Abdominal aortic aneurysm, 5.8 cm in diameter.      02/10/2015 Pathology Results    Diagnosis FINE NEEDLE ASPIRATION, ENDOSCOPIC, PANCREAS TAIL (SPECIMEN 1 OF 1 COLLECTED 02/10/15): MALIGNANT CELLS CONSISTENT WITH METASTATIC SMALL CELL CARCINOMA.      02/14/2015 - 03/06/2015 Chemotherapy    Cisplatin/Etoposide      03/06/2015 Treatment Plan Change    Change systemic therapy to Carboplatin based      03/07/2015 - 05/10/2015 Chemotherapy    Carboplatin/Etoposide.      04/13/2015 Imaging    resolution of pancreatic metastases, decrease in size of R perihilar mss and RUL mass. Interval response to therapy      05/10/2015 -  Chemotherapy    Change to carboplatin/irinotecan for cycle #5 and #6 secondary to significant skin toxicity presumably from etoposide      07/12/2015 -  Radiation Therapy    25 Gy to whole brain and 30 Gy to R hilar region by Dr. MoLisbeth Renshaw     09/13/2015 Imaging    CT C/A/P unchanged from  2/28. persistent prominence of peribronchovascular intersitium throughout R lung, in perihilar region, no definitive findings to suggest progression of disease on exam      11/03/2015 Imaging    MRI brain 3 mm area of restricted diffusion R posterior temporal lobe without abnormal enhancement favor acute infarct, mild dural thickening enhancement over R hemisphere now present previously      11/15/2015 Imaging    CT C/A/P Interval development of 1.6 x 1.4 cm L adrenal nodule suspicious for metastatic lesion. No other sites of new metastatic  disease are noted elsewhere in C/A/P. Evolving postradiation changes in R lung      12/01/2015 - 12/14/2015 Radiation Therapy    Radiation therapy to L adrenal nodule       HISTORY OF PRESENTING ILLNESS:  [name] 55 y.o. male is here for further up of extensive stage SCLC.   [name] is unaccompanied.   He denies any new headaches. He is feeling well.  He is finished with radiation after completing 10 treatments. He has experienced 1 or 2 episodes of nausea but is not concerned about this. He is not sure if Dr. Lisbeth Renshaw scheduled him for a Brain MRI. He does not recall when he is scheduled for follow up with Dr. Lisbeth Renshaw. He notes that LP was not recommended.   He is still struggling to quit smoking. He has not smoked in the house for 15 years which helped him decrease smoking. He anticipates it'll be easier to quit when there is cold weather. States quitting drinking was much easier, he wishes smoking was the same way.  No other major complaints today. He remarks he feels fairly well. He is looking forward to his son's school football season.   MEDICAL HISTORY:  Past Medical History:  Diagnosis Date  . AAA (abdominal aortic aneurysm) ([place])   . Arthritis   . COPD (chronic obstructive pulmonary disease) ([place])   . Coronary artery disease 2002   Lesion in LAD s/p angioplasty; s/p CABG x 4 Sept 2013  . Essential hypertension, benign   . Hyperlipidemia   . Myocardial infarct ([place]) 1999, 2002  . Pancreatitis   . Small cell lung cancer ([place])    Stage IIIB with extension the pancreas    SURGICAL HISTORY: Past Surgical History:  Procedure Laterality Date  . ABDOMINAL AORTIC ENDOVASCULAR STENT GRAFT N/A 08/11/2015   Procedure: ABDOMINAL AORTIC ENDOVASCULAR STENT GRAFT;  Surgeon: Serafina Mitchell, MD;  [facility];  Service: Vascular;  Laterality: N/A;  . [place], 2002  . CORONARY ARTERY BYPASS GRAFT  01/02/2012   Procedure: CORONARY ARTERY BYPASS GRAFTING (CABG);   Surgeon: [name], MD;  Location: [place];  Service: Open Heart Surgery;  Laterality: N/A;  times three using left internal mammary artery and right endoscopically harvested saphenous vein  . EUS N/A 02/10/2015   Procedure: UPPER ENDOSCOPIC ULTRASOUND (EUS) LINEAR;  Surgeon: [name], MD;  Location: [facility] ENDOSCOPY;  Service: Endoscopy;  Laterality: N/A;  . KNEE SURGERY Left    ACL repair  . LEFT HEART CATHETERIZATION WITH CORONARY ANGIOGRAM N/A 01/01/2012   Procedure: LEFT HEART CATHETERIZATION WITH CORONARY ANGIOGRAM;  Surgeon: Hillary Bow, MD;  Location[facility] C[facility] LAB;  Service: Cardiovascular;  Laterality: N/A;  . LUNG BIOPSY N/A 01/26/2015   Procedure: LUNG BIOPSY;  Surgeon: [name], MD;  Location: [place];  Service: Thoracic;  Laterality: N/A;  . LYMPH NODE BIOPSY N/A 01/26/2015   Procedure: LYMPH NODE BIOPSY;  Surgeon: [name], MD;  Location: [place];  Service: Thoracic;  Laterality: N/A;  . MULTIPLE EXTRACTIONS WITH ALVEOLOPLASTY  02/21/2012   Procedure: MULTIPLE EXTRACION WITH ALVEOLOPLASTY;  Surgeon: Lenn Cal, DDS;  Location: [facility] ORS;  Service: Oral Surgery;  Laterality: N/A;  Extaction of tooth #'s 4,5,6,7,10,11,12,13,14,15,19,20,21,22,23,24,25,   . PORTACATH PLACEMENT Left 02/01/2015   Procedure: ATTEMPTED INSERTION OF PORT-A-CATH;  Surgeon: [name], MD;  Location: [facility] [facility];  Service: Thoracic;  Laterality: Left;  . TEE WITHOUT CARDIOVERSION  01/02/2012   Procedure: TRANSESOPHAGEAL ECHOCARDIOGRAM (TEE);  Surgeon: [name], MD;  Location: [facility] [place];  Service: Open Heart Surgery;  Laterality: N/A;  . VIDEO BRONCHOSCOPY WITH ENDOBRONCHIAL ULTRASOUND N/A 01/26/2015   Procedure: VIDEO BRONCHOSCOPY WITH ENDOBRONCHIAL ULTRASOUND;  Surgeon: [name], MD;  Location: [facility] [place];  Service: Thoracic;  Laterality: N/A;    SOCIAL HISTORY: Social History   Social History  . Marital status: Married    Spouse name: N/A  . Number of children: 5    . Years of education: N/A   Occupational History  . Works in [employer]  . Smoking status: Current Every Day Smoker    Packs/day: 1.50    Years: 35.00    Types: Cigarettes    Start date: 06/30/1976  . Smokeless tobacco: Never Used  . Alcohol use No     Comment: Maybe a 12 pack/mth if that - very rare since 2013  . Drug use: No  . Sexual activity: Yes   Other Topics Concern  . Not on file   Social History Narrative  . No narrative on file   They've been married 17 years, with five kids.  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 Heart disease Father   . Diabetes Father   . Hyperlipidemia Father   . Hypertension Father   . Hyperlipidemia Mother   . Hypertension Mother   . Stroke Mother   . Diabetes Paternal Grandmother   . Cancer Neg Hx    indicated that his mother is alive. He indicated that his father is alive. He indicated that his sister is alive. He indicated that his brother is alive. He indicated that his maternal grandmother is deceased. He indicated that his maternal grandfather is deceased. He indicated that his paternal grandmother is deceased. He indicated that his paternal grandfather is deceased. He indicated that the status of his neg hx is unknown.    His father's somewhere in [place]; his mother lives here in [place] and works at [employer] 75 at the end of this month She's got high blood pressure, high cholesterol, no strokes that they know of; is on blood thinners Has an aneurism at the base of the aorta where it goes into your stomach He knows his father's had high blood  pressure and diabetes; Parents have been divorced since he was 3-4 Has half brothers / half sisters   ALLERGIES:  is allergic to lisinopril and tea.  MEDICATIONS:  Current Outpatient Prescriptions  Medication Sig Dispense Refill  . amLODipine (NORVASC)  5 MG tablet TAKE ONE (1) TABLET BY MOUTH EVERY DAY 90 tablet 3  . aspirin EC 81 MG tablet Take 81 mg by mouth daily.    . carvedilol (COREG) 6.25 MG tablet TAKE ONE TABLET BY MOUTH TWICE A DAY WITH A MEAL 180 tablet 1  . pravastatin (PRAVACHOL) 40 MG tablet TAKE ONE (1) TABLET BY MOUTH EVERY DAY 90 tablet 2   No current facility-administered medications for this visit.     Review of Systems  Constitutional:  Negative for fever, chills, weight loss.  HENT: Negative for congestion, hearing loss, nosebleeds, sore throat and tinnitus.   Eyes: Negative for blurred vision, double vision, pain and discharge.  Respiratory: Negative for hemoptysis, cough, shortness of breath and wheezing.   Cardiovascular: Negative for palpitations, claudication, leg swelling and PND.  Gastrointestinal: Negative for heartburn, nausea, vomiting, abdominal pain, diarrhea, constipation, blood in stool and melena.  Genitourinary: Negative for dysuria, urgency, frequency and hematuria.  Musculoskeletal: Positive for joint pain and myalgias. Negative for falls.   Skin:  Negative for itching. Neurological: Negative for dizziness, tingling, tremors, sensory change, speech change, focal weakness, seizures, loss of consciousness, weakness and headaches.  Endo/Heme/Allergies: Does not bruise/bleed easily.  Psychiatric/Behavioral: Negative for depression, suicidal ideas, memory loss and substance abuse. The patient is not nervous/anxious and does not have insomnia.   All other systems reviewed and are negative.  14 point ROS was done and is otherwise as detailed above or in HPI   PHYSICAL EXAMINATION: ECOG PERFORMANCE STATUS: 0 - Asymptomatic   BP 129/76 (BP  Location: Left Arm, Patient Position: Sitting)   Pulse 84   Temp 98 F (36.7 C) (Oral)   Resp 16   Wt 213 lb 12.8 oz (97 kg)   SpO2 99%   BMI 31.12 kg/m   Physical Exam  Constitutional: He is oriented to person, place, and time and well-developed, well-nourished, and in no distress.  HENT:  Head: Normocephalic and atraumatic.  Nose: Nose normal.  Mouth/Throat: Oropharynx is clear and moist. No oropharyngeal exudate.  Dentures on top and bottom.  Eyes: Conjunctivae and EOM are normal. Pupils are equal, round, and reactive to light. Right eye exhibits no discharge. Left eye exhibits no discharge. No scleral icterus.  Neck: Normal range of motion. Neck supple. No tracheal deviation present. No thyromegaly present.  Cardiovascular: Normal rate, regular rhythm and normal heart sounds.  Exam reveals no gallop and no friction rub.   No murmur heard. Pulmonary/Chest: Effort normal. He has no wheezes. He has no rales.  Abdominal: Soft. Bowel sounds are normal. He exhibits no distension and no mass. There is no tenderness. There is no rebound and no guarding.  Musculoskeletal: Normal range of motion. He exhibits no edema.  Lymphadenopathy:    He has no cervical adenopathy.  Neurological: He is alert and oriented to person, place, and time. He has normal reflexes. No cranial nerve deficit. Gait normal. Coordination normal.  Skin: Skin is warm and dry. No rash noted. Psychiatric: Mood, memory, affect and judgment normal.  Nursing note and vitals reviewed.   LABORATORY DATA:  I have reviewed the data as listed.  CBC    Component Value Date/Time   WBC 4.1 11/17/2015 1134   RBC 3.91 (L) 11/17/2015 1134   HGB 12.0 (L) 11/17/2015 1134   HCT 35.1 (L) 11/17/2015 1134   PLT 144 (L) 11/17/2015 1134   MCV 89.8 11/17/2015 1134   MCH 30.7 11/17/2015 1134   MCHC 34.2 11/17/2015 1134  RDW 14.7 11/17/2015 1134   LYMPHSABS 1.1 11/17/2015 1134   MONOABS 0.3 11/17/2015 1134   EOSABS 0.1  11/17/2015 1134   BASOSABS 0.0 11/17/2015 1134   CMP     Component Value Date/Time   NA 132 (L) 11/17/2015 1134   K 3.8 11/17/2015 1134   CL 100 (L) 11/17/2015 1134   CO2 26 11/17/2015 1134   GLUCOSE 121 (H) 11/17/2015 1134   BUN 16 11/17/2015 1134   CREATININE 1.13 11/17/2015 1134   CREATININE 0.99 01/27/2015 1450   CALCIUM 8.9 11/17/2015 1134   PROT 7.9 11/17/2015 1134   ALBUMIN 4.2 11/17/2015 1134   AST 16 11/17/2015 1134   ALT 14 (L) 11/17/2015 1134   ALKPHOS 53 11/17/2015 1134   BILITOT 0.6 11/17/2015 1134   GFRNONAA >60 11/17/2015 1134   GFRNONAA 87 01/27/2015 1450   GFRAA >60 11/17/2015 1134   GFRAA >89 01/27/2015 1450    RADIOGRAPHIC STUDIES: I have personally reviewed the radiological images as listed and agreed with the findings in the report. Study Result   CLINICAL DATA:  55 year old male with history of small cell lung cancer. Followup study. EXAM: CT CHEST, ABDOMEN, AND PELVIS WITH CONTRAST TECHNIQUE: Multidetector CT imaging of the chest, abdomen and pelvis was performed following the standard protocol during bolus administration of intravenous contrast. CONTRAST:  1108m ISOVUE-300 IOPAMIDOL (ISOVUE-300) INJECTION 61% COMPARISON:  CT the chest, abdomen and pelvis 09/13/2015. FINDINGS: CT CHEST FINDINGS Cardiovascular: Heart size is normal. There is no significant pericardial fluid, thickening or pericardial calcification. There is aortic atherosclerosis, as well as atherosclerosis of the great vessels of the mediastinum and the coronary arteries, including calcified atherosclerotic plaque in the left main, left anterior descending, left circumflex and right coronary arteries. Status post median sternotomy for CABG, including LIMA to the LAD. Mediastinum/Nodes: Prominent amorphous nodal tissue in the right hilar region is very similar to prior studies, without discrete measurable lymphadenopathy. Low right paratracheal lymph node measuring 9 mm in  short axis. No left hilar adenopathy. Esophagus is unremarkable in appearance. No axillary lymphadenopathy. Lungs/Pleura: Again noted is a nodular area of architectural distortion in the periphery of the right upper lobe near the minor fissure (image 66 of series 7), which currently measures 2.8 x 2.1 cm (essentially unchanged). Adjacent areas of nodularity along the minor fissure are also unchanged compared to the prior examination. New nodular areas of ground-glass attenuation are noted in the inferior aspect of the right upper lobe abutting the major fissure, largest of which measures 12 x 8 mm (image 62 of series 7). There is also some amorphous ground-glass attenuation and septal thickening in the central aspect of the right middle lobe (image 83 of series 7) which is more conspicuous in the prior examination. Both of these areas likely reflect evolving postradiation changes. Previously noted 4 mm right lower lobe pulmonary nodule is unchanged (image 105 of series 7). No new suspicious appearing pulmonary nodules or masses are otherwise noted. No acute consolidative airspace disease. No pleural effusions. Musculoskeletal: Median sternotomy wires. There are no aggressive appearing lytic or blastic lesions noted in the visualized portions of the skeleton. CT ABDOMEN AND PELVIS FINDINGS Hepatobiliary: No suspicious cystic or solid hepatic lesions. No intra or extrahepatic biliary ductal dilatation. Gallbladder is normal in appearance. Pancreas: No pancreatic mass. No pancreatic ductal dilatation. No pancreatic or peripancreatic fluid or inflammatory changes. Spleen: Unremarkable. Adrenals/Urinary Tract: New 1.4 x 1.6 cm left adrenal nodule, suspicious for a metastatic lesion. Right adrenal gland is normal  in appearance. Bilateral kidneys are normal in appearance. No hydroureteronephrosis. Urinary bladder is normal in appearance. Stomach/Bowel: Normal appearance of the stomach. No  pathologic dilatation of small bowel or colon. Normal appendix. Vascular/Lymphatic: Aortic atherosclerosis, including a 5.8 x 5.9 cm fusiform infrarenal abdominal aortic aneurysm which appears similar to slightly decreased compared to the prior examination (previously 6 cm in diameter) 09/12/2015. Patient is status post aorto bi-iliac bypass graft placement. No high attenuation within the excluded aneurysm sac to strongly suggest endoleak on today's examination. Both limbs of the bypass graft appear to be widely patent. No lymphadenopathy noted in the abdomen or pelvis. Reproductive: Prostate gland and seminal vesicles are unremarkable in appearance. Other: No significant volume of ascites.  No pneumoperitoneum. Musculoskeletal: There are no aggressive appearing lytic or blastic lesions noted in the visualized portions of the skeleton. IMPRESSION: 1. Interval development of a 1.6 x 1.4 cm left adrenal nodule suspicious for a metastatic lesion. 2. No other sites of new metastatic disease are noted elsewhere in the chest, abdomen or pelvis. 3. Evolving postradiation changes in the right lung, as above. No definite evidence to suggest local recurrence of disease. Prominent nodal tissue in the right hilar and low right paratracheal region is very similar to the prior examination and nonspecific (Presumably treated disease). 4. Aortic atherosclerosis, including a 5.8 x 5.9 cm fusiform infrarenal abdominal aortic aneurysm status post aorto bi-iliac bypass graft placement. There is also left main and 3 vessel coronary artery disease in this patient with history of prior CABG including LIMA to the LAD. 5. Additional incidental findings, as above. Electronically Signed   By: Vinnie Langton M.D.   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 Large hypermetabolic right hilar mass with mediastinal invasion and infrahilar extension  Hyponatremia c/w SIADH  EUS with Dr. JaArdis Hughsnd pancreatic biopsy on 02/10/2015 c/w SCLC Anemia secondary to chemotherapy CT C/A/P 04/13/2015 with interval response to therapy. 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 I have arranged for repeat imaging at the end of September and will see him back post. I have not ordered repeat MRI of the brain. I advised him that I anticipate that Dr. MoLisbeth Renshawill be doing this, if not we can order at follow-up.   He continues to smoke and has increased his smoking. I discussed with the patient again the importance of smoking cessation. We again discussed options to quit.   RTC to discuss CT imaging results at the end of September.   All questions were answered. The patient knows to call the clinic with any problems, questions or concerns.  This document serves as a record of services personally performed by ShAncil LinseyMD. It was created on her behalf by ElArlyce Harmana trained medical scribe. The creation of  this record is based on the scribe's personal observations and the provider's statements to them. This document has been checked and approved by the attending provider.  I have reviewed the above documentation for accuracy and completeness, and I agree with the above.  This note was electronically  signed.    Kelby Fam. Whitney Muse, MD

## 2015-12-16 NOTE — Patient Instructions (Addendum)
[place] at [facility] Discharge Instructions  RECOMMENDATIONS MADE BY THE CONSULTANT AND ANY TEST RESULTS WILL BE SENT TO YOUR REFERRING PHYSICIAN.  You saw Dr. Whitney Muse today. CT scans will be scheduled end of September.  Follow up at cancer center after CT scans.  Thank you for choosing [place] at [facility] to provide your oncology and hematology care.  To afford each patient quality time with our provider, please arrive at least 15 minutes before your scheduled appointment time.   Beginning January 23rd 2017 lab work for the [facility] will be done in the  Main lab at [facility] on 1st floor. If you have a lab appointment with the [facility] please come in thru the  Main Entrance and check in at the main information desk  You need to re-schedule your appointment should you arrive 10 or more minutes late.  We strive to give you quality time with our providers, and arriving late affects you and other patients whose appointments are after yours.  Also, if you no show three or more times for appointments you may be dismissed from the clinic at the providers discretion.     Again, thank you for choosing [facility].  Our hope is that these requests will decrease the amount of time that you wait before being seen by our physicians.       _____________________________________________________________  Should you have questions after your visit to [facility], please contact our office at [phone] between the hours of 8:30 a.m. and 4:30 p.m.  Voicemails left after 4:30 p.m. will not be returned until the following business day.  For prescription refill requests, have your pharmacy contact our office.         Resources For Cancer Patients and their Caregivers ? American Cancer Society: Can assist with transportation, wigs, general needs, runs Look Good Feel Better.        413-487-2576 ? Cancer  Care: Provides financial assistance, online support groups, medication/co-pay assistance.  1-800-813-HOPE (614) 149-5334) ? [facility] Assists [place] cancer patients and their families through emotional , educational and financial support.  973 133 6512 ? [place] DSS Where to apply for food stamps, Medicaid and utility assistance. (986)716-9630 ? [facility]: Transportation to medical appointments. (862) 553-8375 ? Social Security Administration: May apply for disability if have a Stage IV cancer. 650-505-3028 (272)179-3694 ? [employer], Disability and Transit Services: Assists with nutrition, care and transit needs. [place] Support Programs: '@10RELATIVEDAYS'$ @ > Cancer Support Group  2nd Tuesday of the month 1pm-2pm, Journey Room  > Creative Journey  3rd Tuesday of the month 1130am-1pm, Journey Room  > Look Good Feel Better  1st Wednesday of the month 10am-12 noon, Journey Room (Call [facility] to register [phone])

## 2016-01-17 ENCOUNTER — Telehealth (HOSPITAL_COMMUNITY): Payer: Self-pay | Admitting: Oncology

## 2016-01-17 NOTE — Telephone Encounter (Signed)
Peer to peer conversation completed with insurance company for CT chest, abdomen, and pelvis. Initially CT pelvis was denied based upon insurance guidelines. Long conversation and argument regarding the role of CT imaging of pelvis performed. Insurance company agreed to approve this imaging test. Approval number is [id number].  Robynn Pane, PA-C 01/17/2016 8:49 AM

## 2016-01-18 ENCOUNTER — Ambulatory Visit (HOSPITAL_COMMUNITY)
Admission: RE | Admit: 2016-01-18 | Discharge: 2016-01-18 | Disposition: A | Discharge status: Home / Self Care | Payer: Medicaid Other | Source: Ambulatory Visit | Attending: Hematology & Oncology | Admitting: Hematology & Oncology

## 2016-01-18 DIAGNOSIS — R918 Other nonspecific abnormal finding of lung field: Secondary | ICD-10-CM | POA: Diagnosis not present

## 2016-01-18 DIAGNOSIS — C3491 Malignant neoplasm of unspecified part of right bronchus or lung: Secondary | ICD-10-CM | POA: Diagnosis not present

## 2016-01-18 DIAGNOSIS — I714 Abdominal aortic aneurysm, without rupture: Secondary | ICD-10-CM | POA: Diagnosis not present

## 2016-01-18 DIAGNOSIS — E278 Other specified disorders of adrenal gland: Secondary | ICD-10-CM | POA: Diagnosis not present

## 2016-01-18 DIAGNOSIS — K869 Disease of pancreas, unspecified: Secondary | ICD-10-CM | POA: Diagnosis not present

## 2016-01-18 DIAGNOSIS — I2510 Atherosclerotic heart disease of native coronary artery without angina pectoris: Secondary | ICD-10-CM | POA: Insufficient documentation

## 2016-01-18 DIAGNOSIS — I700 Atherosclerosis of aorta: Secondary | ICD-10-CM | POA: Insufficient documentation

## 2016-01-18 DIAGNOSIS — J432 Centrilobular emphysema: Secondary | ICD-10-CM | POA: Diagnosis not present

## 2016-01-18 DIAGNOSIS — M5136 Other intervertebral disc degeneration, lumbar region: Secondary | ICD-10-CM | POA: Diagnosis not present

## 2016-01-18 DIAGNOSIS — M47896 Other spondylosis, lumbar region: Secondary | ICD-10-CM | POA: Diagnosis not present

## 2016-01-18 DIAGNOSIS — K8689 Other specified diseases of pancreas: Secondary | ICD-10-CM

## 2016-01-18 MED ORDER — IOPAMIDOL (ISOVUE-300) INJECTION 61%
100.0000 mL | Freq: Once | INTRAVENOUS | Status: AC | PRN
  Administered 2016-01-18: 100 mL via INTRAVENOUS

## 2016-01-19 ENCOUNTER — Other Ambulatory Visit (HOSPITAL_COMMUNITY): Payer: Self-pay | Admitting: Radiation Oncology

## 2016-01-19 DIAGNOSIS — C7970 Secondary malignant neoplasm of unspecified adrenal gland: Secondary | ICD-10-CM

## 2016-01-19 DIAGNOSIS — C3411 Malignant neoplasm of upper lobe, right bronchus or lung: Secondary | ICD-10-CM

## 2016-01-20 ENCOUNTER — Encounter (HOSPITAL_COMMUNITY): Payer: Self-pay | Admitting: Hematology & Oncology

## 2016-01-20 ENCOUNTER — Encounter (HOSPITAL_COMMUNITY): Payer: Medicaid Other | Attending: Hematology & Oncology | Admitting: Hematology & Oncology

## 2016-01-20 VITALS — BP 120/70 | HR 80 | Temp 97.6°F | Resp 16 | Wt 216.2 lb

## 2016-01-20 DIAGNOSIS — Z0000 Encounter for general adult medical examination without abnormal findings: Secondary | ICD-10-CM | POA: Insufficient documentation

## 2016-01-20 DIAGNOSIS — D630 Anemia in neoplastic disease: Secondary | ICD-10-CM | POA: Diagnosis not present

## 2016-01-20 DIAGNOSIS — R9089 Other abnormal findings on diagnostic imaging of central nervous system: Secondary | ICD-10-CM

## 2016-01-20 DIAGNOSIS — D6481 Anemia due to antineoplastic chemotherapy: Secondary | ICD-10-CM

## 2016-01-20 DIAGNOSIS — C3491 Malignant neoplasm of unspecified part of right bronchus or lung: Secondary | ICD-10-CM | POA: Insufficient documentation

## 2016-01-20 DIAGNOSIS — E222 Syndrome of inappropriate secretion of antidiuretic hormone: Secondary | ICD-10-CM | POA: Diagnosis not present

## 2016-01-20 DIAGNOSIS — K869 Disease of pancreas, unspecified: Secondary | ICD-10-CM | POA: Insufficient documentation

## 2016-01-20 DIAGNOSIS — Z720 Tobacco use: Secondary | ICD-10-CM | POA: Diagnosis not present

## 2016-01-20 DIAGNOSIS — C3411 Malignant neoplasm of upper lobe, right bronchus or lung: Secondary | ICD-10-CM

## 2016-01-20 DIAGNOSIS — Z23 Encounter for immunization: Secondary | ICD-10-CM | POA: Diagnosis not present

## 2016-01-20 DIAGNOSIS — R918 Other nonspecific abnormal finding of lung field: Secondary | ICD-10-CM | POA: Insufficient documentation

## 2016-01-20 DIAGNOSIS — C7972 Secondary malignant neoplasm of left adrenal gland: Secondary | ICD-10-CM | POA: Diagnosis not present

## 2016-01-20 DIAGNOSIS — E871 Hypo-osmolality and hyponatremia: Secondary | ICD-10-CM

## 2016-01-20 DIAGNOSIS — K8689 Other specified diseases of pancreas: Secondary | ICD-10-CM

## 2016-01-20 DIAGNOSIS — E279 Disorder of adrenal gland, unspecified: Secondary | ICD-10-CM

## 2016-01-20 DIAGNOSIS — E278 Other specified disorders of adrenal gland: Secondary | ICD-10-CM

## 2016-01-20 MED ORDER — INFLUENZA VAC SPLIT QUAD 0.5 ML IM SUSY
0.5000 mL | PREFILLED_SYRINGE | Freq: Once | INTRAMUSCULAR | Status: AC
  Administered 2016-01-20: 0.5 mL via INTRAMUSCULAR
  Filled 2016-01-20: qty 0.5

## 2016-01-20 NOTE — Progress Notes (Signed)
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       01/18/2015 Imaging    CT chest- 8.7 x 8.7 cm R hilar mass encasing the R pulm artery and occluding the R upper lobe bronchus. Sm R pleural effusion; malignant or reactive. 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 34 mm x 26 mm mass in body of pancreas      01/21/2015 Imaging    MRI brain- No acute intracranial findings. No signs of metastatic disease. Minor white matter disease, stable and nonspecific.      01/26/2015 Pathology Results    Lung, biopsy, right upper lobe - SMALL CELL CARCINOMA.      01/26/2015 Pathology Results    Diagnosis FINE NEEDLE ASPIRATION: ENDOSCOPIC SPECIMEN A, EBUS LEVEL 7 NODE (SPECIMEN 1 OF 3 COLLECTED 01/26/2015) MALIGNANT CELLS PRESENT, CONSISTENT WITH SMALL CELL CARCINOMA.      01/26/2015 Pathology Results    WANG NEEDLE ASPIRATION, SPECIMEN B LEVEL 7 NODE (SPECIMEN 2 OF 3 COLLECTED  01/26/2015) MALIGNANT CELLS PRESENT, CONSISTENT WITH SMALL CELL CARCINOMA.      01/26/2015 Pathology Results    Diagnosis BRONCHIAL BRUSHING SPECIMEN C, RIGHT UPPER LOBE (SPECIMEN 3 OF 3 COLLECTED 01/26/2015) ATYPICAL CELLS PRESENT.      01/27/2015 PET scan    Large hypermetabolic right hilar mass with mediastinal invasion and infrahilar extension, maximum standard uptake value 13.0.  Pancreatic tail mass, approximately 4.5 cm in diameter. Abdominal aortic aneurysm, 5.8 cm in diameter.      02/10/2015 Pathology Results    Diagnosis FINE NEEDLE ASPIRATION, ENDOSCOPIC, PANCREAS TAIL (SPECIMEN 1 OF 1 COLLECTED 02/10/15): MALIGNANT CELLS CONSISTENT WITH METASTATIC SMALL CELL CARCINOMA.      02/14/2015 - 03/06/2015 Chemotherapy    Cisplatin/Etoposide      03/06/2015 Treatment Plan Change    Change systemic therapy to Carboplatin based      03/07/2015 - 05/10/2015 Chemotherapy    Carboplatin/Etoposide.      04/13/2015 Imaging    resolution of pancreatic metastases, decrease in size of R perihilar mss and RUL mass. Interval response to therapy      05/10/2015 -  Chemotherapy    Change to carboplatin/irinotecan for cycle #5 and #6 secondary to significant skin toxicity presumably from etoposide      07/12/2015 -  Radiation Therapy    25 Gy to whole brain and 30 Gy to R hilar region by Dr. MoLisbeth Renshaw     09/13/2015 Imaging    CT C/A/P unchanged from  2/28. persistent prominence of peribronchovascular intersitium throughout R lung, in perihilar region, no definitive findings to suggest progression of disease on exam      11/03/2015 Imaging    MRI brain 3 mm area of restricted diffusion R posterior temporal lobe without abnormal enhancement favor acute infarct, mild dural thickening enhancement over R hemisphere now present previously      11/15/2015 Imaging    CT C/A/P Interval development of 1.6 x 1.4 cm L adrenal nodule suspicious for metastatic lesion. No other sites of new metastatic  disease are noted elsewhere in C/A/P. Evolving postradiation changes in R lung      12/01/2015 - 12/14/2015 Radiation Therapy    Radiation therapy to L adrenal nodule      01/18/2016 Imaging    CT C/A/P 1. Stable appearance of the chest. Specifically, there are post XRT findings in the right lung with perihilar and hilar airway thickening, some upper lobe airway plugging, and scattered densities which merit observation but which are likely therapy rib related. 2. Centrilobular emphysema  3. Coronary, aortic arch, and branch vessel atherosclerotic vascular disease 4. On prior PET-CT from 01/27/2015 there was a pancreatic body mass. Prior biopsy from 02/10/2015 showed this to be consistent with metastatic small cell carcinoma. This mass is not visible on today's CT scan, and has presumably resolved with treatment. There is a transition from a somewhat atrophic pancreatic tail to a normal caliber pancreatic body occurring in the vicinity of the prior mass. 5. Reduced size of a small left adrenal mass, currently 1.2 by 1.7 cm 6. Infrarenal abdominal aortic aneurysm up to 5.8 cm diameter, with patent aorta bi-iliac stent graft in place and no evidence of endoleak 7. Lumbar spondylosis and degenerative disc disease with mild impingement at L3-4 and L4-5       HISTORY OF PRESENTING ILLNESS:  [name] 55 y.o. male is here for further up of extensive stage SCLC.   Mr. Salminen is accompanied by his wife. I personally reviewed and went over imaging studies with the patient. He completed XRT to a new solitary adrenal metastases at the end of August.  States he has another MRI of the brain in about 3 weeks. Denies headaches, blurry vision.   He is feeling fine. Appetite is good. He is active. Continues to be involved with his family.   He states he has never had a flu shot in his life. His wife corrects him and says he had one last year. He is willing to proceed with flu vaccination today. No new  cough, no fever. He unfortunately continues to smoke.   MEDICAL HISTORY:  Past Medical History:  Diagnosis Date  . AAA (abdominal aortic aneurysm) ([place])   . Arthritis   . COPD (chronic obstructive pulmonary disease) ([place])   . Coronary artery disease 2002   Lesion in LAD s/p angioplasty; s/p CABG x 4 Sept 2013  . Essential hypertension, benign   . Hyperlipidemia   . Myocardial infarct ([place]) 1999, 2002  . Pancreatitis   . Small cell lung cancer ([place])    Stage IIIB with extension the pancreas    SURGICAL HISTORY: Past Surgical History:  Procedure Laterality Date  . ABDOMINAL AORTIC ENDOVASCULAR STENT GRAFT N/A 08/11/2015   Procedure: ABDOMINAL AORTIC ENDOVASCULAR STENT GRAFT;  Surgeon: Serafina Mitchell, MD;  Location[facility];  Service: Vascular;  Laterality: N/A;  . [place], 2002  . CORONARY ARTERY BYPASS GRAFT  01/02/2012   Procedure: CORONARY ARTERY BYPASS  GRAFTING (CABG);  Surgeon: [name], MD;  Location: [facility];  Service: Open Heart Surgery;  Laterality: N/A;  times three using left internal mammary artery and right endoscopically harvested saphenous vein  . EUS N/A 02/10/2015   Procedure: UPPER ENDOSCOPIC ULTRASOUND (EUS) LINEAR;  Surgeon: [name], MD;  Location: [facility] ENDOSCOPY;  Service: Endoscopy;  Laterality: N/A;  . KNEE SURGERY Left    ACL repair  . LEFT HEART CATHETERIZATION WITH CORONARY ANGIOGRAM N/A 01/01/2012   Procedure: LEFT HEART CATHETERIZATION WITH CORONARY ANGIOGRAM;  Surgeon: Hillary Bow, MD;  Location: [facility] CATH LAB;  Service: Cardiovascular;  Laterality: N/A;  . LUNG BIOPSY N/A 01/26/2015   Procedure: LUNG BIOPSY;  Surgeon: [name], MD;  Location: [facility] [place];  Service: Thoracic;  Laterality: N/A;  . LYMPH NODE BIOPSY N/A 01/26/2015   Procedure: LYMPH NODE BIOPSY;  Surgeon: [name], MD;  Location: [facility];  Service: Thoracic;  Laterality: N/A;  . MULTIPLE EXTRACTIONS WITH ALVEOLOPLASTY  02/21/2012   Procedure:  MULTIPLE EXTRACION WITH ALVEOLOPLASTY;  Surgeon: Lenn Cal, DDS;  Location: [facility] ORS;  Service: Oral Surgery;  Laterality: N/A;  Extaction of tooth #'s 4,5,6,7,10,11,12,13,14,15,19,20,21,22,23,24,25,   . PORTACATH PLACEMENT Left 02/01/2015   Procedure: ATTEMPTED INSERTION OF PORT-A-CATH;  Surgeon: [name], MD;  Location: [facility];  Service: Thoracic;  Laterality: Left;  . TEE WITHOUT CARDIOVERSION  01/02/2012   Procedure: TRANSESOPHAGEAL ECHOCARDIOGRAM (TEE);  Surgeon: [name], MD;  Location: [facility] [place] [facility];  Service: Open Heart Surgery;  Laterality: N/A;  . VIDEO BRONCHOSCOPY WITH ENDOBRONCHIAL ULTRASOUND N/A 01/26/2015   Procedure: VIDEO BRONCHOSCOPY WITH ENDOBRONCHIAL ULTRASOUND;  Surgeon: [name], MD;  Location: [facility];  Service: Thoracic;  Laterality: N/A;    SOCIAL HISTORY: Social History   Social History  . Marital status: Married    Spouse name: N/A  . Number of children: 5  . Years of education: N/A   Occupational History  . Works in [place]  . Smoking status: Current Every Day Smoker    Packs/day: 1.50    Years: 35.00    Types: Cigarettes    Start date: 06/30/1976  . Smokeless tobacco: Never Used  . Alcohol use No     Comment: Maybe a 12 pack/mth if that - very rare since 2013  . Drug use: No  . Sexual activity: Yes   Other Topics Concern  . Not on file   Social History Narrative  . No narrative on file   They've been married 17 years, with five kids.  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 Heart disease Father   . Diabetes Father   . Hyperlipidemia Father   .  Hypertension Father   . Hyperlipidemia Mother   . Hypertension Mother   . Stroke Mother   . Diabetes Paternal Grandmother   . Cancer Neg Hx    indicated that his mother is alive. He indicated that his father is alive. He indicated that his sister is alive. He indicated that his brother is alive. He indicated  that his maternal grandmother is deceased. He indicated that his maternal grandfather is deceased. He indicated that his paternal grandmother is deceased. He indicated that his paternal grandfather is deceased. He indicated that the status of his neg hx is unknown.    His father's somewhere in [place]; his mother lives here in [place] and works at [employer] 76 at the end of this month She's got high blood pressure, high cholesterol, no strokes that they know of; is on blood thinners Has an aneurism at the base of the aorta where it goes into your stomach He knows his father's had high blood pressure and diabetes; Parents have been divorced since he was 3-4 Has half brothers / half sisters   ALLERGIES:  is allergic to lisinopril and tea.  MEDICATIONS:  Current Outpatient Prescriptions  Medication Sig Dispense Refill  . amLODipine (NORVASC) 5 MG tablet TAKE ONE (1) TABLET BY MOUTH EVERY DAY 90 tablet 3  . aspirin EC 81 MG tablet Take 81 mg by mouth daily.    . carvedilol (COREG) 6.25 MG tablet TAKE ONE TABLET BY MOUTH TWICE A DAY WITH A MEAL 180 tablet 1  . pravastatin (PRAVACHOL) 40 MG tablet TAKE ONE (1) TABLET BY MOUTH EVERY DAY 90 tablet 2   No current facility-administered medications for this visit.     Review of Systems  Constitutional: Negative.   HENT: Negative.   Eyes: Negative.   Respiratory: Negative.   Cardiovascular: Negative.   Gastrointestinal: Negative.   Genitourinary: Negative.   Musculoskeletal: Positive for joint pain and myalgias.  Skin: Negative.   Neurological: Negative.   Endo/Heme/Allergies: Negative.   Psychiatric/Behavioral:  Negative.   All other systems reviewed and are negative. 14 point ROS was done and is otherwise as detailed above or in HPI   PHYSICAL EXAMINATION: ECOG PERFORMANCE STATUS: 0 - Asymptomatic   BP 120/70 (BP Location: Right Arm, Patient Position: Sitting)   Pulse 80   Temp 97.6 F (36.4 C) (Oral)   Resp 16   Wt 216 lb 3.2 oz (98.1 kg)   SpO2 98%   BMI 31.47 kg/m   Physical Exam  Constitutional: He is oriented to person, place, and time.  HENT:  Head: Normocephalic and atraumatic.  Nose: Nose normal.  Mouth/Throat: Oropharynx is clear and moist. No oropharyngeal exudate.  Dentures on top and bottom.   Eyes: Conjunctivae and EOM are normal. Pupils are equal, round, and reactive to light. Right eye exhibits no discharge. Left eye exhibits no discharge. No scleral icterus.  Neck: Normal range of motion. Neck supple. No tracheal deviation present. No thyromegaly present.  Cardiovascular: Normal rate, regular rhythm and normal heart sounds.  Exam reveals no gallop and no friction rub.   No murmur heard. Pulmonary/Chest: Effort normal and breath sounds normal. He has no wheezes. He has no rales.  Abdominal: Soft. Bowel sounds are normal. He exhibits no distension and no mass. There is no tenderness. There is no rebound and no guarding.  Musculoskeletal: Normal range of motion. He exhibits no edema.  Lymphadenopathy:    He has no cervical adenopathy.  Neurological: He is alert and oriented to person, place, and time. He has normal reflexes. No cranial nerve deficit. Coordination normal.  Skin: Skin is warm and dry. No rash noted.  Psychiatric: Judgment normal.  Nursing note and vitals reviewed.   LABORATORY DATA:  I have reviewed the data as listed.  CBC    Component Value Date/Time   WBC 4.1 11/17/2015 1134  RBC 3.91 (L) 11/17/2015 1134   HGB 12.0 (L) 11/17/2015 1134   HCT 35.1 (L) 11/17/2015 1134   PLT 144 (L) 11/17/2015 1134   MCV 89.8 11/17/2015 1134   MCH 30.7  11/17/2015 1134   MCHC 34.2 11/17/2015 1134   RDW 14.7 11/17/2015 1134   LYMPHSABS 1.1 11/17/2015 1134   MONOABS 0.3 11/17/2015 1134   EOSABS 0.1 11/17/2015 1134   BASOSABS 0.0 11/17/2015 1134   CMP     Component Value Date/Time   NA 132 (L) 11/17/2015 1134   K 3.8 11/17/2015 1134   CL 100 (L) 11/17/2015 1134   CO2 26 11/17/2015 1134   GLUCOSE 121 (H) 11/17/2015 1134   BUN 16 11/17/2015 1134   CREATININE 1.13 11/17/2015 1134   CREATININE 0.99 01/27/2015 1450   CALCIUM 8.9 11/17/2015 1134   PROT 7.9 11/17/2015 1134   ALBUMIN 4.2 11/17/2015 1134   AST 16 11/17/2015 1134   ALT 14 (L) 11/17/2015 1134   ALKPHOS 53 11/17/2015 1134   BILITOT 0.6 11/17/2015 1134   GFRNONAA >60 11/17/2015 1134   GFRNONAA 87 01/27/2015 1450   GFRAA >60 11/17/2015 1134   GFRAA >89 01/27/2015 1450    RADIOGRAPHIC STUDIES: I have personally reviewed the radiological images as listed and agreed with the findings in the report. Study Result   CLINICAL DATA:  Small cell lung cancer on the right, restaging. Prior metastatic disease to the pancreas.  EXAM: CT CHEST, ABDOMEN, AND PELVIS WITH CONTRAST  TECHNIQUE: Multidetector CT imaging of the chest, abdomen and pelvis was performed following the standard protocol during bolus administration of intravenous contrast.  CONTRAST:  147m ISOVUE-300 IOPAMIDOL (ISOVUE-300) INJECTION 61%  COMPARISON:  Multiple exams, including 01/27/2015 PET-CT and CT scan from 11/15/2015  FINDINGS: CT CHEST FINDINGS  Cardiovascular: Coronary, aortic arch, and branch vessel atherosclerotic vascular disease. Prior CABG.  Mediastinum/Nodes: No current pathologic thoracic adenopathy.  Lungs/Pleura: Stable subpleural nodularity in the right middle lobe along the minor fissure, image 70/3. Additional stable nodularity along the confluence of the fissures, 2.9 by 2.1 cm on image 67/3, no change. Ill-defined right upper lobe nodule in the peribronchovascular  region measuring 1.3 by 0.9 cm on image 61/3, previously 1.2 by 0.9 cm by my measurements. Volume loss and probably airway plugging medial to the right upper lobe anterior paraseptal bulla on images 45-46 series 3, not appreciably changed. Airway plugging over a fairly long segments of the right upper lobe anterior and apical segmental bronchi, as before. Mild airway thickening. Considerable paraseptal emphysema. No new nodules or opacities identified. There is some inflammatory appearing nodularity posteriorly in the right middle lobe on images 81 through 89 of series 3, not appreciably changed.  Musculoskeletal: Degenerative findings in the left glenohumeral joint with degenerative subcortical cyst formation especially in the inferior glenoid. Thoracic spondylosis.  CT ABDOMEN PELVIS FINDINGS  Hepatobiliary: Unremarkable  Pancreas: Back on 01/27/2015, the patient had a hypermetabolic mass in the body of the pancreas. At this site, there is a slight transition of smaller caliber pancreatic tail to the normal caliber pancreatic body, but I do not see a discrete residual mass.  Spleen: Unremarkable  Adrenals/Urinary Tract: Reduced size of the small left pancreatic adrenal mass, currently 1.2 by 1.7 cm on image 93/4, previously 1.91 by 1.6 cm when measured in a similar fashion on 11/15/2015.  Otherwise unremarkable.  Stomach/Bowel: Unremarkable  Vascular/Lymphatic: Infrarenal abdominal aortic aneurysm, 5.8 cm transverse by 5.5 cm anterior - posterior on image 80/2, with aorta bi-iliac stent  graft and no evidence of endoleak. The graft and its limbs appear patent.  Porta hepatis node 0.9 cm in short axis on image 59/2, previously 1.0 cm. No overtly pathologic adenopathy in the abdomen or pelvis.  Reproductive: Calcifications in the central zone of the prostate gland.  Other: No supplemental non-categorized findings.  Musculoskeletal: Lumbar spondylosis and  degenerative disc disease causing mild degrees of impingement at L3-4 and L4-5.  IMPRESSION: 1. Stable appearance of the chest. Specifically, there are postradiation therapy findings in the right lung with perihilar and hilar airway thickening, some upper lobe airway plugging, and scattered densities which merit observation but which are likely therapy rib related. Given the scattered densities and pleural/peribronchovascular nodularity, surveillance is likely indicated. 2. Centrilobular emphysema. 3. Coronary, aortic arch, and branch vessel atherosclerotic vascular disease. 4. On prior PET-CT from 01/27/2015 there was a pancreatic body mass. Prior biopsy from 02/10/2015 showed this to be consistent with metastatic small cell carcinoma. This mass is not visible on today's CT scan, and has presumably resolved with treatment. There is a transition from a somewhat atrophic pancreatic tail to a normal caliber pancreatic body occurring in the vicinity of the prior mass. 5. Reduced size of a small left adrenal mass, currently 1.2 by 1.7 cm. 6. Infrarenal abdominal aortic aneurysm up to 5.8 cm diameter, with patent aorta bi-iliac stent graft in place and no evidence of endoleak. 7. Lumbar spondylosis and degenerative disc disease with mild impingement at L3-4 and L4-5.   Electronically Signed   By: Van Clines M.D.   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 Large hypermetabolic right hilar mass with mediastinal invasion and infrahilar extension  Hyponatremia c/w SIADH  EUS with Dr. JaArdis Hughsnd pancreatic biopsy on 02/10/2015 c/w SCLC Anemia secondary to chemotherapy CT C/A/P 04/13/2015 with interval response to therapy. 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 CT imaging is reviewed in detail. No evidence of recurrence at this time, scans are stable. I have reordered repeat staging in 3 months. He has an upcoming brain MRI and follow-up with Rad Onc in the next several weeks.  He will receive the flu vaccination today.  He continues to smoke and has increased his smoking. I discussed with the patient again the importance of smoking cessation. We again discussed options to quit.   Labs at return, CBC and CMP.   Repeat scans in 3 months.  He will return for follow up post scans to discuss these results with PE. He does not need any refills today.   Orders Placed This Encounter  Procedures  . CT Abdomen Pelvis W Contrast    Standing Status:   Future    Standing Expiration Date:   01/19/2017    Order Specific Question:   If  indicated for the ordered procedure, I authorize the administration of contrast media per Radiology protocol    Answer:   Yes    Order Specific Question:   Reason for Exam (SYMPTOM  OR DIAGNOSIS REQUIRED)    Answer:   restaging extensive stage SCLC    Order Specific Question:   Preferred imaging location?    Answer:   [facility]  . CT Chest W Contrast    Standing Status:   Future    Standing Expiration Date:   01/19/2017    Order Specific Question:   If indicated for the ordered procedure, I authorize the administration of contrast media per Radiology protocol    Answer:   Yes    Order Specific Question:   Reason for  Exam (SYMPTOM  OR DIAGNOSIS REQUIRED)    Answer:   restaging extensive stage SCLC    Order Specific Question:   Preferred imaging location?    Answer:   [facility]  . CBC with Differential    Standing Status:   Future    Standing Expiration Date:   01/19/2017  . Comprehensive metabolic panel    Standing Status:   Future    Standing Expiration Date:   01/19/2017    All questions were answered. The patient knows to call the clinic with any problems, questions or concerns.  This document serves as a record of services personally performed by Ancil Linsey, MD. It was created on her behalf by Arlyce Harman, a trained medical scribe. The creation of this record is based on the scribe's personal observations and the provider's statements to them. This document has been checked and approved by the attending provider.  I have reviewed the above documentation for accuracy and completeness, and I agree with the above.  This note was electronically signed.    Kelby Fam. Whitney Muse, MD

## 2016-01-20 NOTE — Progress Notes (Signed)
Roby Lofts presents today for injection per MD orders. Flu Vaccine administered IM in left Upper Arm. Administration without incident. Patient tolerated well.

## 2016-01-20 NOTE — Patient Instructions (Addendum)
[place] at [facility] Discharge Instructions  RECOMMENDATIONS MADE BY THE CONSULTANT AND ANY TEST RESULTS WILL BE SENT TO YOUR REFERRING PHYSICIAN.  You saw Dr.Penland today. You will be scheduled for CT scan. Flu shot today. Follow up in 3 months with labs after CT.  Thank you for choosing [place] at [facility] to provide your oncology and hematology care.  To afford each patient quality time with our provider, please arrive at least 15 minutes before your scheduled appointment time.   Beginning January 23rd 2017 lab work for the [facility] will be done in the  Main lab at [facility] on 1st floor. If you have a lab appointment with the [place] please come in thru the  Main Entrance and check in at the main information desk  You need to re-schedule your appointment should you arrive 10 or more minutes late.  We strive to give you quality time with our providers, and arriving late affects you and other patients whose appointments are after yours.  Also, if you no show three or more times for appointments you may be dismissed from the clinic at the providers discretion.     Again, thank you for choosing [facility].  Our hope is that these requests will decrease the amount of time that you wait before being seen by our physicians.       _____________________________________________________________  Should you have questions after your visit to [facility], please contact our office at [phone] between the hours of 8:30 a.m. and 4:30 p.m.  Voicemails left after 4:30 p.m. will not be returned until the following business day.  For prescription refill requests, have your pharmacy contact our office.         Resources For Cancer Patients and their Caregivers ? American Cancer Society: Can assist with transportation, wigs, general needs, runs Look Good Feel Better.        (548)429-7771 ? Cancer  Care: Provides financial assistance, online support groups, medication/co-pay assistance.  1-800-813-HOPE 669-641-3665) ? [place] Assists [place] cancer patients and their families through emotional , educational and financial support.  (253)767-4541 ? [place] DSS Where to apply for food stamps, Medicaid and utility assistance. 724 170 5088 ? [facility]: Transportation to medical appointments. 731 123 1129 ? Social Security Administration: May apply for disability if have a Stage IV cancer. 346 478 0880 (613)067-9725 ? [employer], Disability and Transit Services: Assists with nutrition, care and transit needs. [place] Support Programs: '@10RELATIVEDAYS'$ @ > Cancer Support Group  2nd Tuesday of the month 1pm-2pm, Journey Room  > Creative Journey  3rd Tuesday of the month 1130am-1pm, Journey Room  > Look Good Feel Better  1st Wednesday of the month 10am-12 noon, Journey Room (Call [place] to register [phone])

## 2016-01-21 ENCOUNTER — Encounter (HOSPITAL_COMMUNITY): Payer: Self-pay | Admitting: Hematology & Oncology

## 2016-02-02 ENCOUNTER — Ambulatory Visit (HOSPITAL_COMMUNITY)
Admission: RE | Admit: 2016-02-02 | Discharge: 2016-02-02 | Disposition: A | Discharge status: Home / Self Care | Payer: Medicaid Other | Source: Ambulatory Visit | Attending: Radiation Oncology | Admitting: Radiation Oncology

## 2016-02-02 DIAGNOSIS — C7970 Secondary malignant neoplasm of unspecified adrenal gland: Secondary | ICD-10-CM | POA: Insufficient documentation

## 2016-02-02 DIAGNOSIS — C3411 Malignant neoplasm of upper lobe, right bronchus or lung: Secondary | ICD-10-CM

## 2016-02-02 LAB — POCT I-STAT CREATININE: CREATININE: 1.2 mg/dL (ref 0.61–1.24)

## 2016-02-02 MED ORDER — GADOBENATE DIMEGLUMINE 529 MG/ML IV SOLN
20.0000 mL | Freq: Once | INTRAVENOUS | Status: AC | PRN
  Administered 2016-02-02: 20 mL via INTRAVENOUS

## 2016-03-09 ENCOUNTER — Encounter: Payer: Self-pay | Admitting: Family Medicine

## 2016-03-09 ENCOUNTER — Ambulatory Visit (INDEPENDENT_AMBULATORY_CARE_PROVIDER_SITE_OTHER): Payer: Medicaid Other | Admitting: Family Medicine

## 2016-03-09 VITALS — BP 130/78 | HR 84 | Temp 98.1°F | Resp 14 | Ht 69.0 in | Wt 208.0 lb

## 2016-03-09 DIAGNOSIS — J209 Acute bronchitis, unspecified: Secondary | ICD-10-CM

## 2016-03-09 DIAGNOSIS — Z720 Tobacco use: Secondary | ICD-10-CM | POA: Diagnosis not present

## 2016-03-09 MED ORDER — GUAIFENESIN-CODEINE 100-10 MG/5ML PO SOLN: 10.0000 mL | Freq: Four times a day (QID) | ORAL | 0 refills | Status: DC | PRN

## 2016-03-09 MED ORDER — AZITHROMYCIN 250 MG PO TABS: ORAL_TABLET | ORAL | 0 refills | Status: DC

## 2016-03-09 NOTE — Progress Notes (Signed)
   Subjective:    Patient ID: [name], male    DOB: [birth year], 55 y.o.   MRN: [id number]  Patient presents for Illness (x4 days- productive cough with white colored mucus, chest tightness, fatigue)   Cough with production, chest tightness, fatigue, runny nose, mild headache for past 4 days. He is a smoker smokes about 1-1/2 packs per day. 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 His cough actually started about 3 weeks ago he's been sitting outside at night watching his son play football one night he was caught in the morning. This has now progressed along with the other symptoms. He's been taking over-the-counter mucinex DM and ibuprofen for headache which helps, no HA currently, no fever     Review Of Systems:  GEN- +fatigue,denies  fever, weight loss,weakness, recent illness HEENT- denies eye drainage, change in vision, +nasal discharge, CVS- denies chest pain, palpitations RESP- denies SOB, +cough, wheeze ABD- denies N/V, change in stools, abd pain GU- denies dysuria, hematuria, dribbling, incontinence MSK- denies joint pain, muscle aches, injury Neuro- denies headache, dizziness, syncope, seizure activity       Objective:    BP 130/78 (BP Location: Left Arm, Patient Position: Sitting, Cuff Size: Normal)   Pulse 84   Temp 98.1 F (36.7 C) (Oral)   Resp 14   Ht '5\' 9"'$  (1.753 m)   Wt 208 lb (94.3 kg)   SpO2 98%   BMI 30.72 kg/m  GEN- NAD, alert and oriented x3 HEENT- PERRL, EOMI, non injected sclera, pink conjunctiva, MMM, oropharynx clear, nares clear rhinorrhea  Neck- Supple, no LAD  CVS- RRR, no murmur RESP-congestion bilat, occ wheeze, cleared with cough, normal WOB, no retractions  EXT- No edema Pulses- Radial 2+        Assessment & Plan:      Problem List Items Addressed This Visit    Tobacco abuse    Other Visit Diagnoses    Acute bronchitis, unspecified organism    -  Primary   Treat for  bronchitis, zpak, robitussin AC, fluids, no fever today, has COPD on list but no active medications, no inhaler needed at this time. discussed tobacco cessation,       Note: This dictation was prepared with Dragon dictation along with smaller phrase technology. Any transcriptional errors that result from this process are unintentional.

## 2016-03-09 NOTE — Patient Instructions (Addendum)
Take antibiotics Use cough medicine as prescribed Give work note for today  F/U as needed

## 2016-03-12 ENCOUNTER — Telehealth: Payer: Self-pay | Admitting: Family Medicine

## 2016-03-12 NOTE — Telephone Encounter (Signed)
Call placed to patient and patient wife [name] aware.

## 2016-03-12 NOTE — Telephone Encounter (Signed)
Received call from patient wife.   Reports that patient has completed Robitussin AC. Reports that he was given prescription for 19m PO Q 6 Hrs. Also states that patient did not measure cough syrup, he just drank out of the bottle.   States that he has had (2) episodes of dizziness and has had (2) falls. Reports episodes happened after coughing for a long period of time.   Advised patient may be having vagal response to cough. Advised to use OTC cough syrup for cough. Advised if patient looses consciousness to go directly to ED.

## 2016-03-12 NOTE — Telephone Encounter (Signed)
Call placed to patient and patient wife, [name].   [place].   MD please advise.

## 2016-03-12 NOTE — Telephone Encounter (Signed)
No further prescription cough medicine, he can take Mucinex DM If he has another fall he needs to come in for evaluation or go to ER   he took too much cough medicine  If he gets any new symptoms N/V, chest pain, SOB go to ER

## 2016-03-12 NOTE — Telephone Encounter (Signed)
Patients wife if calling to speak to you regarding his condition that he was seen for July 16, 2022, patient has passed out twice from the cough syrup wife think he has taken too much, he is already out please call her    [phone]

## 2016-04-02 ENCOUNTER — Other Ambulatory Visit (HOSPITAL_COMMUNITY): Payer: Medicaid Other

## 2016-04-02 ENCOUNTER — Ambulatory Visit: Payer: Medicaid Other | Admitting: Surgery

## 2016-04-11 ENCOUNTER — Encounter (HOSPITAL_COMMUNITY): Payer: Medicaid Other | Attending: Hematology & Oncology

## 2016-04-11 DIAGNOSIS — K8689 Other specified diseases of pancreas: Secondary | ICD-10-CM

## 2016-04-11 DIAGNOSIS — K869 Disease of pancreas, unspecified: Secondary | ICD-10-CM | POA: Insufficient documentation

## 2016-04-11 DIAGNOSIS — C3491 Malignant neoplasm of unspecified part of right bronchus or lung: Secondary | ICD-10-CM | POA: Insufficient documentation

## 2016-04-11 LAB — COMPREHENSIVE METABOLIC PANEL
ALK PHOS: 49 U/L (ref 38–126)
ALT: 17 U/L (ref 17–63)
AST: 20 U/L (ref 15–41)
Albumin: 4 g/dL (ref 3.5–5.0)
Anion gap: 8 (ref 5–15)
BUN: 12 mg/dL (ref 6–20)
CALCIUM: 9.1 mg/dL (ref 8.9–10.3)
CO2: 28 mmol/L (ref 22–32)
CREATININE: 1.17 mg/dL (ref 0.61–1.24)
Chloride: 98 mmol/L — ABNORMAL LOW (ref 101–111)
Glucose, Bld: 101 mg/dL — ABNORMAL HIGH (ref 65–99)
Potassium: 4.5 mmol/L (ref 3.5–5.1)
Sodium: 134 mmol/L — ABNORMAL LOW (ref 135–145)
Total Bilirubin: 0.7 mg/dL (ref 0.3–1.2)
Total Protein: 8 g/dL (ref 6.5–8.1)

## 2016-04-11 LAB — CBC WITH DIFFERENTIAL/PLATELET
Basophils Absolute: 0 10*3/uL (ref 0.0–0.1)
Basophils Relative: 0 %
Eosinophils Absolute: 0.2 10*3/uL (ref 0.0–0.7)
Eosinophils Relative: 4 %
HCT: 36.4 % — ABNORMAL LOW (ref 39.0–52.0)
HEMOGLOBIN: 12 g/dL — AB (ref 13.0–17.0)
LYMPHS ABS: 1.1 10*3/uL (ref 0.7–4.0)
LYMPHS PCT: 28 %
MCH: 30.1 pg (ref 26.0–34.0)
MCHC: 33 g/dL (ref 30.0–36.0)
MCV: 91.2 fL (ref 78.0–100.0)
Monocytes Absolute: 0.4 10*3/uL (ref 0.1–1.0)
Monocytes Relative: 11 %
NEUTROS ABS: 2.2 10*3/uL (ref 1.7–7.7)
NEUTROS PCT: 57 %
Platelets: 146 10*3/uL — ABNORMAL LOW (ref 150–400)
RBC: 3.99 MIL/uL — AB (ref 4.22–5.81)
RDW: 14.4 % (ref 11.5–15.5)
WBC: 3.8 10*3/uL — AB (ref 4.0–10.5)

## 2016-04-13 ENCOUNTER — Ambulatory Visit (HOSPITAL_COMMUNITY)
Admission: RE | Admit: 2016-04-13 | Discharge: 2016-04-13 | Disposition: A | Discharge status: Home / Self Care | Payer: Medicaid Other | Source: Ambulatory Visit | Attending: Hematology & Oncology | Admitting: Hematology & Oncology

## 2016-04-13 DIAGNOSIS — Z923 Personal history of irradiation: Secondary | ICD-10-CM | POA: Diagnosis not present

## 2016-04-13 DIAGNOSIS — C3491 Malignant neoplasm of unspecified part of right bronchus or lung: Secondary | ICD-10-CM | POA: Insufficient documentation

## 2016-04-13 DIAGNOSIS — K8689 Other specified diseases of pancreas: Secondary | ICD-10-CM | POA: Insufficient documentation

## 2016-04-13 DIAGNOSIS — I7789 Other specified disorders of arteries and arterioles: Secondary | ICD-10-CM | POA: Diagnosis not present

## 2016-04-13 DIAGNOSIS — I723 Aneurysm of iliac artery: Secondary | ICD-10-CM | POA: Insufficient documentation

## 2016-04-13 DIAGNOSIS — I700 Atherosclerosis of aorta: Secondary | ICD-10-CM | POA: Diagnosis not present

## 2016-04-13 DIAGNOSIS — I289 Disease of pulmonary vessels, unspecified: Secondary | ICD-10-CM | POA: Insufficient documentation

## 2016-04-13 MED ORDER — IOPAMIDOL (ISOVUE-300) INJECTION 61%
100.0000 mL | Freq: Once | INTRAVENOUS | Status: AC | PRN
  Administered 2016-04-13: 100 mL via INTRAVENOUS

## 2016-04-18 ENCOUNTER — Encounter (HOSPITAL_COMMUNITY): Payer: Self-pay | Admitting: Hematology & Oncology

## 2016-04-18 ENCOUNTER — Encounter (HOSPITAL_BASED_OUTPATIENT_CLINIC_OR_DEPARTMENT_OTHER): Payer: Medicaid Other | Admitting: Hematology & Oncology

## 2016-04-18 VITALS — BP 108/73 | HR 83 | Temp 97.4°F | Resp 18 | Wt 211.6 lb

## 2016-04-18 DIAGNOSIS — Z720 Tobacco use: Secondary | ICD-10-CM

## 2016-04-18 DIAGNOSIS — K869 Disease of pancreas, unspecified: Secondary | ICD-10-CM | POA: Diagnosis not present

## 2016-04-18 DIAGNOSIS — C3411 Malignant neoplasm of upper lobe, right bronchus or lung: Secondary | ICD-10-CM | POA: Diagnosis not present

## 2016-04-18 DIAGNOSIS — R9089 Other abnormal findings on diagnostic imaging of central nervous system: Secondary | ICD-10-CM

## 2016-04-18 DIAGNOSIS — C7972 Secondary malignant neoplasm of left adrenal gland: Secondary | ICD-10-CM | POA: Diagnosis not present

## 2016-04-18 DIAGNOSIS — J00 Acute nasopharyngitis [common cold]: Secondary | ICD-10-CM

## 2016-04-18 DIAGNOSIS — E279 Disorder of adrenal gland, unspecified: Secondary | ICD-10-CM

## 2016-04-18 DIAGNOSIS — D6481 Anemia due to antineoplastic chemotherapy: Secondary | ICD-10-CM | POA: Diagnosis not present

## 2016-04-18 DIAGNOSIS — C3491 Malignant neoplasm of unspecified part of right bronchus or lung: Secondary | ICD-10-CM

## 2016-04-18 DIAGNOSIS — K8689 Other specified diseases of pancreas: Secondary | ICD-10-CM

## 2016-04-18 NOTE — Progress Notes (Signed)
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       01/18/2015 Imaging    CT chest- 8.7 x 8.7 cm R hilar mass encasing the R pulm artery and occluding the R upper lobe bronchus. Sm R pleural effusion; malignant or reactive. 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 34 mm x 26 mm mass in body of pancreas      01/21/2015 Imaging    MRI brain- No acute intracranial findings. No signs of metastatic disease. Minor white matter disease, stable and nonspecific.      01/26/2015 Pathology Results    Lung, biopsy, right upper lobe - SMALL CELL CARCINOMA.      01/26/2015 Pathology Results    Diagnosis FINE NEEDLE ASPIRATION: ENDOSCOPIC SPECIMEN A, EBUS LEVEL 7 NODE (SPECIMEN 1 OF 3 COLLECTED 01/26/2015) MALIGNANT CELLS PRESENT, CONSISTENT WITH SMALL CELL CARCINOMA.      01/26/2015 Pathology Results    WANG NEEDLE ASPIRATION, SPECIMEN B LEVEL 7 NODE (SPECIMEN 2 OF 3 COLLECTED  01/26/2015) MALIGNANT CELLS PRESENT, CONSISTENT WITH SMALL CELL CARCINOMA.      01/26/2015 Pathology Results    Diagnosis BRONCHIAL BRUSHING SPECIMEN C, RIGHT UPPER LOBE (SPECIMEN 3 OF 3 COLLECTED 01/26/2015) ATYPICAL CELLS PRESENT.      01/27/2015 PET scan    Large hypermetabolic right hilar mass with mediastinal invasion and infrahilar extension, maximum standard uptake value 13.0.  Pancreatic tail mass, approximately 4.5 cm in diameter. Abdominal aortic aneurysm, 5.8 cm in diameter.      02/10/2015 Pathology Results    Diagnosis FINE NEEDLE ASPIRATION, ENDOSCOPIC, PANCREAS TAIL (SPECIMEN 1 OF 1 COLLECTED 02/10/15): MALIGNANT CELLS CONSISTENT WITH METASTATIC SMALL CELL CARCINOMA.      02/14/2015 - 03/06/2015 Chemotherapy    Cisplatin/Etoposide      03/06/2015 Treatment Plan Change    Change systemic therapy to Carboplatin based      03/07/2015 - 05/10/2015 Chemotherapy    Carboplatin/Etoposide.      04/13/2015 Imaging    resolution of pancreatic metastases, decrease in size of R perihilar mss and RUL mass. Interval response to therapy      05/10/2015 -  Chemotherapy    Change to carboplatin/irinotecan for cycle #5 and #6 secondary to significant skin toxicity presumably from etoposide      07/12/2015 -  Radiation Therapy    25 Gy to whole brain and 30 Gy to R hilar region by Dr. MoLisbeth Renshaw     09/13/2015 Imaging    CT C/A/P unchanged from  2/28. persistent prominence of peribronchovascular intersitium throughout R lung, in perihilar region, no definitive findings to suggest progression of disease on exam      11/03/2015 Imaging    MRI brain 3 mm area of restricted diffusion R posterior temporal lobe without abnormal enhancement favor acute infarct, mild dural thickening enhancement over R hemisphere now present previously      11/15/2015 Imaging    CT C/A/P Interval development of 1.6 x 1.4 cm L adrenal nodule suspicious for metastatic lesion. No other sites of new metastatic  disease are noted elsewhere in C/A/P. Evolving postradiation changes in R lung      12/01/2015 - 12/14/2015 Radiation Therapy    Radiation therapy to L adrenal nodule      01/18/2016 Imaging    CT C/A/P 1. Stable appearance of the chest. Specifically, there are post XRT findings in the right lung with perihilar and hilar airway thickening, some upper lobe airway plugging, and scattered densities which merit observation but which are likely therapy rib related. 2. Centrilobular emphysema  3. Coronary, aortic arch, and branch vessel atherosclerotic vascular disease 4. On prior PET-CT from 01/27/2015 there was a pancreatic body mass. Prior biopsy from 02/10/2015 showed this to be consistent with metastatic small cell carcinoma. This mass is not visible on today's CT scan, and has presumably resolved with treatment. There is a transition from a somewhat atrophic pancreatic tail to a normal caliber pancreatic body occurring in the vicinity of the prior mass. 5. Reduced size of a small left adrenal mass, currently 1.2 by 1.7 cm 6. Infrarenal abdominal aortic aneurysm up to 5.8 cm diameter, with patent aorta bi-iliac stent graft in place and no evidence of endoleak 7. Lumbar spondylosis and degenerative disc disease with mild impingement at L3-4 and L4-5      02/02/2016 Imaging    No evidence of metastatic disease to the brain.Resolution of small right hemispheric fluid collection. No leptomeningeal enhancement identified on today's study. This finding may have been due to a small subdural hematoma previously which has resolved.Negative for acute infarct. Small area of restricted diffusion in the right posterior temporal lobe on the prior study likely was an area of acute infarct which has resolved.      04/13/2016 Imaging    CT CAP- 1. Relatively similar appearance of areas of right-sided radiation change. No well-defined residual or recurrent disease identified. 2. No thoracic adenopathy. 3. No  evidence of metastatic disease in the abdomen or pelvis.       HISTORY OF PRESENTING ILLNESS:  [name] 55 y.o. male is here for further up of extensive stage SCLC.   I personally reviewed and went over imaging studies with the patient. He is excited about the results. He completed XRT to a new solitary adrenal metastases at the end of August.  He is doing well other than a chest cold. This chest cold started after being outside in the cold and rain. He has been smoking less now that the weather is cold. He tries to avoid going outside in the cold. He does not feel he needs another antibiotic, the chest cold is resolving. He did see his PCP. The sputum production is now only white in color. He notes that cough is improving. He denies fever.   He has noticed he gets cold more easily.   He ate well with the holidays. He denies swelling in his legs. He sleeps well at night.   He last saw radiation oncology last week or  the week before. He will return for follow up there in about 3 months. The patient is not sure when his next brain MRI is scheduled. His last brain MRI was on 02/02/16. Denies headaches or blurry vision. No abdominal pain. No chest pain. No other complaints.    MEDICAL HISTORY:  Past Medical History:  Diagnosis Date  . AAA (abdominal aortic aneurysm) ([place])   . Arthritis   . COPD (chronic obstructive pulmonary disease) ([place])   . Coronary artery disease 2002   Lesion in LAD s/p angioplasty; s/p CABG x 4 Sept 2013  . Essential hypertension, benign   . Hyperlipidemia   . Myocardial infarct 1999, 2002  . Pancreatitis   . Small cell lung cancer ([place])    Stage IIIB with extension the pancreas    SURGICAL HISTORY: Past Surgical History:  Procedure Laterality Date  . ABDOMINAL AORTIC ENDOVASCULAR STENT GRAFT N/A 08/11/2015   Procedure: ABDOMINAL AORTIC ENDOVASCULAR STENT GRAFT;  Surgeon: Serafina Mitchell, MD;  [facility];  Service: Vascular;  Laterality: N/A;  .  [place], 2002  . CORONARY ARTERY BYPASS GRAFT  01/02/2012   Procedure: CORONARY ARTERY BYPASS GRAFTING (CABG);  Surgeon: [name], MD;  Location: [facility];  Service: Open Heart Surgery;  Laterality: N/A;  times three using left internal mammary artery and right endoscopically harvested saphenous vein  . EUS N/A 02/10/2015   Procedure: UPPER ENDOSCOPIC ULTRASOUND (EUS) LINEAR;  Surgeon: [name], MD;  Location: [facility] ENDOSCOPY;  Service: Endoscopy;  Laterality: N/A;  . KNEE SURGERY Left    ACL repair  . LEFT HEART CATHETERIZATION WITH CORONARY ANGIOGRAM N/A 01/01/2012   Procedure: LEFT HEART CATHETERIZATION WITH CORONARY ANGIOGRAM;  Surgeon: Hillary Bow, MD;  Location: [facility] CATH LAB;  Service: Cardiovascular;  Laterality: N/A;  . LUNG BIOPSY N/A 01/26/2015   Procedure: LUNG BIOPSY;  Surgeon: [name], MD;  Location: [place];  Service: Thoracic;  Laterality: N/A;  . LYMPH NODE BIOPSY N/A 01/26/2015   Procedure: LYMPH NODE BIOPSY;  Surgeon: [name], MD;  Location: [place];  Service: Thoracic;  Laterality: N/A;  . MULTIPLE EXTRACTIONS WITH ALVEOLOPLASTY  02/21/2012   Procedure: MULTIPLE EXTRACION WITH ALVEOLOPLASTY;  Surgeon: Lenn Cal, DDS;  Location: [facility] ORS;  Service: Oral Surgery;  Laterality: N/A;  Extaction of tooth #'s 4,5,6,7,10,11,12,13,14,15,19,20,21,22,23,24,25,   . PORTACATH PLACEMENT Left 02/01/2015   Procedure: ATTEMPTED INSERTION OF PORT-A-CATH;  Surgeon: [name], MD;  Location: [place];  Service: Thoracic;  Laterality: Left;  . TEE WITHOUT CARDIOVERSION  01/02/2012   Procedure: TRANSESOPHAGEAL ECHOCARDIOGRAM (TEE);  Surgeon: [name], MD;  Location: [place];  Service: Open Heart Surgery;  Laterality: N/A;  . VIDEO BRONCHOSCOPY WITH ENDOBRONCHIAL ULTRASOUND N/A 01/26/2015   Procedure: VIDEO BRONCHOSCOPY WITH ENDOBRONCHIAL ULTRASOUND;  Surgeon: [name], MD;  Location: [place];  Service: Thoracic;  Laterality: N/A;      SOCIAL HISTORY: Social History   Social History  . Marital status: Married    Spouse name: N/A  . Number of children: 5  . Years of education: N/A   Occupational History  . Works in [employer]  . Smoking status: Current Every Day Smoker    Packs/day: 1.50    Years: 35.00    Types: Cigarettes    Start date: 06/30/1976  . Smokeless tobacco: Never Used  . Alcohol use No     Comment: Maybe a 12 pack/mth  if that - very rare since 2013  . Drug use: No  . Sexual activity: Yes   Other Topics Concern  . Not on file   Social History Narrative  . No narrative on file   They've been married 17 years, with five kids.  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 Heart disease Father   . Diabetes Father   . Hyperlipidemia Father   . Hypertension Father   . Hyperlipidemia Mother   . Hypertension Mother   . Stroke Mother   . Diabetes Paternal Grandmother   . Cancer Neg Hx    indicated that his mother is alive. He indicated that his father is alive. He indicated that his sister is alive. He indicated that his brother is alive. He indicated that his maternal grandmother is deceased. He indicated that his maternal grandfather is deceased. He indicated that his paternal grandmother is deceased. He indicated that his paternal grandfather is deceased. He indicated that the status of his neg hx is unknown.    His father's somewhere in [place]; his mother lives here in [place] and works at [employer] 76 at the end of this month She's got high blood pressure, high cholesterol, no strokes that they know of;  is on blood thinners Has an aneurism at the base of the aorta where it goes into your stomach He knows his father's had high blood pressure and diabetes; Parents have been divorced since he was 3-4 Has half brothers / half sisters   ALLERGIES:  is allergic to lisinopril and tea.  MEDICATIONS:  Current Outpatient Prescriptions  Medication Sig Dispense Refill  . amLODipine (NORVASC) 5 MG tablet TAKE ONE (1) TABLET BY MOUTH EVERY DAY 90 tablet 3  . aspirin EC 81 MG tablet Take 81 mg by mouth daily.    Marland Kitchen azithromycin (ZITHROMAX) 250 MG tablet Take 2 tablets x 1 day, then 1 tab daily for 4 days 6 tablet 0  . carvedilol (COREG) 6.25 MG tablet TAKE ONE TABLET BY MOUTH TWICE A DAY WITH A MEAL 180 tablet 1  . guaiFENesin (MUCINEX) 600 MG 12 hr tablet Take by mouth 2 (two) times daily.    Marland Kitchen guaiFENesin-codeine 100-10 MG/5ML syrup Take 10 mLs by mouth every 6 (six) hours as needed for cough. 140 mL 0  . pravastatin (PRAVACHOL) 40 MG tablet TAKE ONE (1) TABLET BY MOUTH EVERY DAY 90 tablet 2   No current facility-administered medications for this visit.     Review of Systems  Constitutional: Negative.   HENT: Negative.   Eyes: Negative.   Respiratory: Positive for sputum production (white in color).        Resolving chest cold  Cardiovascular: Negative.   Gastrointestinal: Negative.   Genitourinary: Negative.   Musculoskeletal: Positive for joint pain and myalgias.  Skin: Negative.   Neurological: Negative.   Endo/Heme/Allergies: Negative.   Psychiatric/Behavioral: Negative.   All other systems reviewed and are negative. 14 point ROS was done and is otherwise as detailed above or in HPI   PHYSICAL EXAMINATION: ECOG PERFORMANCE STATUS: 0 - Asymptomatic  BP 108/73 (BP Location: Right Arm, Patient Position: Sitting)   Pulse 83   Temp 97.4 F (36.3 C) (Oral)   Resp 18   Wt 211 lb 9.6 oz (96 kg)   SpO2 100%   BMI 31.25 kg/m   Physical Exam  Constitutional: He is oriented to  person, place, and time.  HENT:  Head: Normocephalic and atraumatic.  Nose: Nose normal.  Mouth/Throat: Oropharynx is clear and moist. No oropharyngeal exudate.  Dentures on top and bottom.   Eyes: Conjunctivae and EOM are normal. Pupils are equal, round, and reactive to light. Right eye exhibits no discharge. Left eye exhibits no discharge. No scleral icterus.  Neck: Normal range of motion. Neck supple. No tracheal deviation present. No thyromegaly present.  Cardiovascular: Normal rate, regular rhythm and normal heart sounds.  Exam reveals no gallop and no friction rub.   No murmur heard. Pulmonary/Chest: Effort normal. He has no wheezes. He has no rales.  Occasional rhonchi  Abdominal: Soft. Bowel sounds are normal. He exhibits no distension and no mass. There is no tenderness. There is no rebound and no guarding.  Musculoskeletal: Normal range of motion. He exhibits no edema.  Lymphadenopathy:    He has no cervical adenopathy.  Neurological: He is alert and oriented to person, place, and time. He has normal reflexes. No cranial nerve deficit. Coordination normal.  Skin: Skin is warm and dry. No rash noted.  Psychiatric: Judgment normal.  Nursing note and vitals reviewed.   LABORATORY DATA:  I have reviewed the data as listed.  CBC    Component Value Date/Time   WBC 3.8 (L) 04/11/2016 1033   RBC 3.99 (L) 04/11/2016 1033   HGB 12.0 (L) 04/11/2016 1033   HCT 36.4 (L) 04/11/2016 1033   PLT 146 (L) 04/11/2016 1033   MCV 91.2 04/11/2016 1033   MCH 30.1 04/11/2016 1033   MCHC 33.0 04/11/2016 1033   RDW 14.4 04/11/2016 1033   LYMPHSABS 1.1 04/11/2016 1033   MONOABS 0.4 04/11/2016 1033   EOSABS 0.2 04/11/2016 1033   BASOSABS 0.0 04/11/2016 1033   CMP     Component Value Date/Time   NA 134 (L) 04/11/2016 1033   K 4.5 04/11/2016 1033   CL 98 (L) 04/11/2016 1033   CO2 28 04/11/2016 1033   GLUCOSE 101 (H) 04/11/2016 1033   BUN 12 04/11/2016 1033   CREATININE 1.17 04/11/2016  1033   CREATININE 0.99 01/27/2015 1450   CALCIUM 9.1 04/11/2016 1033   PROT 8.0 04/11/2016 1033   ALBUMIN 4.0 04/11/2016 1033   AST 20 04/11/2016 1033   ALT 17 04/11/2016 1033   ALKPHOS 49 04/11/2016 1033   BILITOT 0.7 04/11/2016 1033   GFRNONAA >60 04/11/2016 1033   GFRNONAA 87 01/27/2015 1450   GFRAA >60 04/11/2016 1033   GFRAA >89 01/27/2015 1450    RADIOGRAPHIC STUDIES: I have personally reviewed the radiological images as listed and agreed with the findings in the report. Study Result   CLINICAL DATA:  Restaging of extensive stage small cell lung cancer with pancreatic mass. Status post chemotherapy. COPD. Endovascular stent graft. Right-sided primary.  EXAM: CT CHEST, ABDOMEN, AND PELVIS WITH CONTRAST  TECHNIQUE: Multidetector CT imaging of the chest, abdomen and pelvis was performed following the standard protocol during bolus administration of intravenous contrast.  CONTRAST:  120m ISOVUE-300 IOPAMIDOL (ISOVUE-300) INJECTION 61%  COMPARISON:  01/18/2016  FINDINGS: CT CHEST FINDINGS  Cardiovascular: Prior median sternotomy. Aortic and branch vessel atherosclerosis. Tortuous thoracic aorta. Normal heart size,  without pericardial effusion. Pulmonary artery enlargement, 3.4 cm outflow tract. No central pulmonary embolism, on this non-dedicated study.  Mediastinum/Nodes: No supraclavicular adenopathy. No mediastinal adenopathy. Right perihilar soft tissue thickening is unchanged, without well-defined adenopathy.  Lungs/Pleura: No pleural fluid. Right upper lobe bronchial compression anteriorly is similar. Moderate bronchial wall thickening. Moderate centrilobular and paraseptal emphysema.  Interstitial thickening within the peribronchovascular inferior right upper lobe is similar, including image 57/series 4. There is also subpleural reticulation in the more peripheral posterior right upper lobe and superior segment right lower lobe. No  well-defined residual or recurrent mass. Anterior right upper lobe subpleural linear opacities are likely radiation induced and new. Thickening of the right major fissure is felt to be similar, including on image 63/series 4.  Musculoskeletal: No acute osseous abnormality.  CT ABDOMEN PELVIS FINDINGS  Hepatobiliary: Normal liver. Normal gallbladder, without biliary ductal dilatation.  Pancreas: No residual or recurrent pancreatic mass. Atrophy involving the tail is again identified.  Spleen: Normal in size, without focal abnormality.  Adrenals/Urinary Tract: Normal adrenal glands. Normal kidneys, without hydronephrosis. Normal urinary bladder.  This  Stomach/Bowel: Normal stomach, without wall thickening. Normal colon, appendix, and terminal ileum. Normal small bowel.  Vascular/Lymphatic: Aortic and branch vessel atherosclerosis. Status post endograft repair. Native sac measures on the order of 5.0 by 4.6 cm versus 5.5 x 5.8 on the prior. No endoleak. Patent iliac volumes. There is persistent dilatation of proximal internal iliac arteries including at 1.5 cm on the right, similar. No abdominopelvic adenopathy.  Reproductive: Normal prostate.  Other: No significant free fluid. No evidence of omental or peritoneal disease.  Musculoskeletal: No acute osseous abnormality. L3-4 and L4-5 disc bulges.  IMPRESSION: 1. Relatively similar appearance of areas of right-sided radiation change. No well-defined residual or recurrent disease identified. 2. No thoracic adenopathy. 3. No evidence of metastatic disease in the abdomen or pelvis. 4.  Aortic atherosclerosis. 5. Status post aortic endograft repair, with decreased size native sac. Persistent dilatation of the proximal internal iliac arteries bilaterally. 6. Pulmonary artery enlargement suggests pulmonary arterial hypertension.   Electronically Signed   By: Abigail Miyamoto M.D.   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 Large hypermetabolic right hilar mass with mediastinal invasion and infrahilar extension  Hyponatremia c/w SIADH  EUS with Dr. JaArdis Hughsnd pancreatic biopsy on 02/10/2015 c/w SCLC Anemia secondary to chemotherapy CT C/A/P 04/13/2015 with interval response to therapy. Presumed drug reaction to etoposide Treatment related anemia CR to first line therapy R calf pain ABNL MRI brain 11/03/2015 L adrenal nodule Abnormal MRI of the brain  CT imaging is reviewed in detail. No evidence of recurrence at this time, scans are stable. I have reordered repeat staging in 3 months. Last brain MRI was WNL.   He continues to smoke . I discussed with the patient again the importance of smoking cessation. We again discussed options to quit.   Labs at return, CBC and CMP.   Repeat scans  in 3 months.  He does not need any refills today.   He will return for follow up post scans to discuss these results with PE.   Orders Placed This Encounter  Procedures  . CT Abdomen Pelvis W Contrast    Standing Status:   Future    Standing Expiration Date:   04/18/2017    Order Specific Question:   If indicated for the ordered procedure, I authorize the administration of contrast media per Radiology protocol    Answer:   Yes    Order Specific Question:   Reason for Exam (SYMPTOM  OR DIAGNOSIS REQUIRED)    Answer:   restaging SCLC, extensive stage    Order Specific  Question:   Preferred imaging location?    Answer:   [facility]  . CT Chest W Contrast    Standing Status:   Future    Standing Expiration Date:   04/18/2017    Order Specific Question:   If indicated for the ordered procedure, I authorize the administration of contrast media per Radiology protocol    Answer:   Yes    Order Specific Question:   Reason for Exam (SYMPTOM  OR DIAGNOSIS REQUIRED)    Answer:   restaging extensive stage SCLC    Order Specific Question:   Preferred imaging location?    Answer:   [facility]    All questions were answered. The patient knows to call the clinic with any problems, questions or concerns.  This document serves as a record of services personally performed by Ancil Linsey, MD. It was created on her behalf by Arlyce Harman, a trained medical scribe. The creation of this record is based on the scribe's personal observations and the provider's statements to them. This document has been checked and approved by the attending provider.  I have reviewed the above documentation for accuracy and completeness, and I agree with the above.  This note was electronically signed.    Kelby Fam. Whitney Muse, MD

## 2016-04-18 NOTE — Patient Instructions (Signed)
[place] at [facility] Discharge Instructions  RECOMMENDATIONS MADE BY THE CONSULTANT AND ANY TEST RESULTS WILL BE SENT TO YOUR REFERRING PHYSICIAN.  You were seen today by Dr. Whitney Muse. Return in March after you've had your scans.  Thank you for choosing [place] at [facility] to provide your oncology and hematology care.  To afford each patient quality time with our provider, please arrive at least 15 minutes before your scheduled appointment time.   Beginning January 23rd 2017 lab work for the [facility] will be done in the  Main lab at [facility] on 1st floor. If you have a lab appointment with the [place] please come in thru the  Main Entrance and check in at the main information desk  You need to re-schedule your appointment should you arrive 10 or more minutes late.  We strive to give you quality time with our providers, and arriving late affects you and other patients whose appointments are after yours.  Also, if you no show three or more times for appointments you may be dismissed from the clinic at the providers discretion.     Again, thank you for choosing [facility].  Our hope is that these requests will decrease the amount of time that you wait before being seen by our physicians.       _____________________________________________________________  Should you have questions after your visit to [facility], please contact our office at [phone] between the hours of 8:30 a.m. and 4:30 p.m.  Voicemails left after 4:30 p.m. will not be returned until the following business day.  For prescription refill requests, have your pharmacy contact our office.         Resources For Cancer Patients and their Caregivers ? American Cancer Society: Can assist with transportation, wigs, general needs, runs Look Good Feel Better.        385-725-2710 ? Cancer Care: Provides financial assistance,  online support groups, medication/co-pay assistance.  1-800-813-HOPE 248-665-9363) ? [place] Assists [place] cancer patients and their families through emotional , educational and financial support.  360 392 9337 ? [place] DSS Where to apply for food stamps, Medicaid and utility assistance. (845)671-2804 ? RCATS: Transportation to medical appointments. 551-167-7387 ? Social Security Administration: May apply for disability if have a Stage IV cancer. 9847248868 [phone] [phone] ? [employer], Disability and Transit Services: Assists with nutrition, care and transit needs. [place] Support Programs: '@10RELATIVEDAYS'$ @ > Cancer Support Group  2nd Tuesday of the month 1pm-2pm, Journey Room  > Creative Journey  3rd Tuesday of the month 1130am-1pm, Journey Room  > Look Good Feel Better  1st Wednesday of the month 10am-12 noon, Journey Room (Call [place] to register [phone])

## 2016-05-08 ENCOUNTER — Encounter: Payer: Self-pay | Admitting: Surgery

## 2016-05-14 ENCOUNTER — Encounter: Payer: Self-pay | Admitting: Surgery

## 2016-05-14 ENCOUNTER — Other Ambulatory Visit: Payer: Self-pay | Admitting: Family Medicine

## 2016-05-14 ENCOUNTER — Ambulatory Visit (INDEPENDENT_AMBULATORY_CARE_PROVIDER_SITE_OTHER): Payer: Medicaid Other | Admitting: Surgery

## 2016-05-14 ENCOUNTER — Other Ambulatory Visit (HOSPITAL_COMMUNITY): Payer: Self-pay | Admitting: Radiation Oncology

## 2016-05-14 VITALS — BP 106/72 | HR 77 | Temp 97.8°F | Resp 16 | Ht 70.0 in | Wt 212.0 lb

## 2016-05-14 DIAGNOSIS — I714 Abdominal aortic aneurysm, without rupture, unspecified: Secondary | ICD-10-CM

## 2016-05-14 DIAGNOSIS — C7970 Secondary malignant neoplasm of unspecified adrenal gland: Secondary | ICD-10-CM

## 2016-05-14 DIAGNOSIS — C3411 Malignant neoplasm of upper lobe, right bronchus or lung: Secondary | ICD-10-CM

## 2016-05-14 MED ORDER — GUAIFENESIN-CODEINE 100-10 MG/5ML PO SOLN: 10.0000 mL | Freq: Four times a day (QID) | ORAL | 0 refills | Status: DC | PRN

## 2016-05-14 NOTE — Telephone Encounter (Signed)
Pt here with wife request refill on cough medicine Has history of lung cancer   Refilled Robitussin AC

## 2016-05-14 NOTE — Progress Notes (Signed)
Vascular and Vein Specialist of [facility]  Patient name: [name] MRN: [id number] DOB: Mar 12, 1961 Sex: male   REASON FOR VISIT:    Follow up AAA  HISOTRY OF PRESENT ILLNESS:    [name] is a 56 y.o. male who is back today for his first postoperative visit.  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  His postoperative course was uncomplicated.  His surgery was initially delayed because he is undergoing treatment for lung cancer.    He has no complaints today.  He denies abdominal pain or back pain.   PAST MEDICAL HISTORY:   Past Medical History:  Diagnosis Date  . AAA (abdominal aortic aneurysm) ([place])   . Arthritis   . COPD (chronic obstructive pulmonary disease) ([place])   . Coronary artery disease 2002   Lesion in LAD s/p angioplasty; s/p CABG x 4 Sept 2013  . Essential hypertension, benign   . Hyperlipidemia   . Myocardial infarct 1999, 2002  . Pancreatitis   . 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 Heart disease Father   . Diabetes Father   . Hyperlipidemia Father   . Hypertension Father   . Hyperlipidemia Mother   . Hypertension Mother   . Stroke Mother   . Diabetes Paternal Grandmother   . Cancer Neg Hx     SOCIAL HISTORY:   Social History  Substance Use Topics  . Smoking status: Current Every Day Smoker    Packs/day: 1.00    Years: 35.00    Types: Cigarettes    Start date: 06/30/1976  . Smokeless tobacco: Never Used  . Alcohol use No     Comment: Maybe a 12 pack/mth if that - very rare since 2013     ALLERGIES:   Allergies  Allergen Reactions  . Lisinopril Cough  . Tea Hives     CURRENT MEDICATIONS:   Current Outpatient Prescriptions  Medication Sig Dispense Refill  . amLODipine (NORVASC) 5 MG tablet TAKE ONE (1) TABLET BY MOUTH EVERY DAY  90 tablet 3  . aspirin EC 81 MG tablet Take 81 mg by mouth daily.    Marland Kitchen azithromycin (ZITHROMAX) 250 MG tablet Take 2 tablets x 1 day, then 1 tab daily for 4 days 6 tablet 0  . carvedilol (COREG) 6.25 MG tablet TAKE ONE TABLET BY MOUTH TWICE A DAY WITH A MEAL 180 tablet 1  . guaiFENesin (MUCINEX) 600 MG 12 hr tablet Take by mouth 2 (two) times daily.    Marland Kitchen guaiFENesin-codeine 100-10 MG/5ML syrup Take 10 mLs by mouth every 6 (six) hours as needed for cough. 140 mL 0  . pravastatin (PRAVACHOL) 40 MG tablet TAKE ONE (1) TABLET BY MOUTH EVERY DAY 90 tablet 2   No current facility-administered medications for this visit.     REVIEW OF SYSTEMS:   '[X]'$  denotes positive finding, '[ ]'$  denotes negative finding Cardiac  Comments:  Chest pain or chest pressure:    Shortness of breath upon exertion:    Short of breath when lying flat:    Irregular heart rhythm:        Vascular    Pain in calf, thigh, or hip brought on by ambulation:    Pain in feet at night that wakes you up from your sleep:     Blood clot in your  veins:    Leg swelling:         Pulmonary    Oxygen at home:    Productive cough:     Wheezing:         Neurologic    Sudden weakness in arms or legs:     Sudden numbness in arms or legs:     Sudden onset of difficulty speaking or slurred speech:    Temporary loss of vision in one eye:     Problems with dizziness:         Gastrointestinal    Blood in stool:     Vomited blood:         Genitourinary    Burning when urinating:     Blood in urine:        Psychiatric    Major depression:         Hematologic    Bleeding problems:    Problems with blood clotting too easily:        Skin    Rashes or ulcers:        Constitutional    Fever or chills:      PHYSICAL EXAM:   Vitals:   05/14/16 1144  BP: 106/72  Pulse: 77  Resp: 16  Temp: 97.8 F (36.6 C)  TempSrc: Oral  SpO2: 97%  Weight: 212 lb (96.2 kg)  Height: '5\' 10"'$  (1.778 m)    GENERAL: The patient is a  well-nourished male, in no acute distress. The vital signs are documented above. CARDIAC: There is a regular rate and rhythm.  PULMONARY: Non-labored respirations ABDOMEN: Soft and non-tender.  No palpable mass MUSCULOSKELETAL: There are no major deformities or cyanosis. NEUROLOGIC: No focal weakness or paresthesias are detected. SKIN: There are no ulcers or rashes noted. PSYCHIATRIC: The patient has a normal affect.  STUDIES:   I have reviewed his CT scan.  Maximum aortic diameter is 5 cm today with no evidence of endoleak  MEDICAL ISSUES:   Status post EVAR:  Patient's aneurysm sac continues to decrease in size without evidence of endoleak.  Maximum diameter was 5 cm today.  I will schedule him for follow-up in 6 months with an ultrasound.  If he gets a CT scan for cancer screening, that would suffice.    Annamarie Major, MD Vascular and Vein Specialists of [facility] [phone] Pager [phone]

## 2016-05-28 ENCOUNTER — Other Ambulatory Visit: Payer: Self-pay | Admitting: Cardiology

## 2016-07-02 ENCOUNTER — Telehealth (HOSPITAL_COMMUNITY): Payer: Self-pay | Admitting: Oncology

## 2016-07-02 ENCOUNTER — Other Ambulatory Visit (HOSPITAL_COMMUNITY): Payer: Self-pay | Admitting: Oncology

## 2016-07-02 DIAGNOSIS — C3491 Malignant neoplasm of unspecified part of right bronchus or lung: Secondary | ICD-10-CM

## 2016-07-02 NOTE — Telephone Encounter (Signed)
Insurance company has denied CT abdomen and pelvis imaging.  They recommend CT abdomen only.  Orders changed in length to appropriate appointment accordingly.  Alisi Lupien, PA-C 07/02/2016 1:42 PM

## 2016-07-03 ENCOUNTER — Other Ambulatory Visit (HOSPITAL_COMMUNITY): Payer: Self-pay | Admitting: *Deleted

## 2016-07-03 DIAGNOSIS — C3491 Malignant neoplasm of unspecified part of right bronchus or lung: Secondary | ICD-10-CM

## 2016-07-04 ENCOUNTER — Encounter (HOSPITAL_COMMUNITY): Payer: Medicaid Other | Attending: Oncology

## 2016-07-04 DIAGNOSIS — C3491 Malignant neoplasm of unspecified part of right bronchus or lung: Secondary | ICD-10-CM | POA: Diagnosis present

## 2016-07-04 LAB — COMPREHENSIVE METABOLIC PANEL
ALT: 12 U/L — ABNORMAL LOW (ref 17–63)
AST: 17 U/L (ref 15–41)
Albumin: 3.8 g/dL (ref 3.5–5.0)
Alkaline Phosphatase: 44 U/L (ref 38–126)
Anion gap: 7 (ref 5–15)
BILIRUBIN TOTAL: 0.6 mg/dL (ref 0.3–1.2)
BUN: 10 mg/dL (ref 6–20)
CALCIUM: 8.8 mg/dL — AB (ref 8.9–10.3)
CO2: 28 mmol/L (ref 22–32)
CREATININE: 1.08 mg/dL (ref 0.61–1.24)
Chloride: 98 mmol/L — ABNORMAL LOW (ref 101–111)
GFR calc Af Amer: >60 mL/min (ref >60)
GFR calc non Af Amer: >60 mL/min (ref >60)
GLUCOSE: 108 mg/dL — AB (ref 65–99)
POTASSIUM: 4 mmol/L (ref 3.5–5.1)
Sodium: 133 mmol/L — ABNORMAL LOW (ref 135–145)
TOTAL PROTEIN: 7.5 g/dL (ref 6.5–8.1)

## 2016-07-04 LAB — CBC WITH DIFFERENTIAL/PLATELET
BASOS ABS: 0 10*3/uL (ref 0.0–0.1)
BASOS PCT: 1 %
Eosinophils Absolute: 0.2 10*3/uL (ref 0.0–0.7)
Eosinophils Relative: 4 %
HEMATOCRIT: 35.4 % — AB (ref 39.0–52.0)
Hemoglobin: 12 g/dL — ABNORMAL LOW (ref 13.0–17.0)
Lymphocytes Relative: 24 %
Lymphs Abs: 1.2 10*3/uL (ref 0.7–4.0)
MCH: 30.1 pg (ref 26.0–34.0)
MCHC: 33.9 g/dL (ref 30.0–36.0)
MCV: 88.7 fL (ref 78.0–100.0)
MONO ABS: 0.5 10*3/uL (ref 0.1–1.0)
Monocytes Relative: 10 %
NEUTROS ABS: 3 10*3/uL (ref 1.7–7.7)
Neutrophils Relative %: 61 %
PLATELETS: 149 10*3/uL — AB (ref 150–400)
RBC: 3.99 MIL/uL — ABNORMAL LOW (ref 4.22–5.81)
RDW: 14.2 % (ref 11.5–15.5)
WBC: 4.8 10*3/uL (ref 4.0–10.5)

## 2016-07-09 ENCOUNTER — Encounter (HOSPITAL_COMMUNITY): Payer: Self-pay

## 2016-07-09 ENCOUNTER — Ambulatory Visit (HOSPITAL_COMMUNITY): Payer: Medicaid Other

## 2016-07-10 ENCOUNTER — Encounter (HOSPITAL_COMMUNITY): Payer: Medicaid Other | Attending: Hematology | Admitting: Hematology

## 2016-07-10 ENCOUNTER — Ambulatory Visit (HOSPITAL_COMMUNITY)
Admission: RE | Admit: 2016-07-10 | Discharge: 2016-07-10 | Disposition: A | Discharge status: Home / Self Care | Payer: Medicaid Other | Source: Ambulatory Visit | Attending: Radiation Oncology | Admitting: Radiation Oncology

## 2016-07-10 ENCOUNTER — Encounter (HOSPITAL_COMMUNITY): Payer: Self-pay | Admitting: Hematology

## 2016-07-10 VITALS — BP 127/75 | HR 80 | Temp 97.8°F | Resp 18 | Wt 212.3 lb

## 2016-07-10 DIAGNOSIS — R059 Cough, unspecified: Secondary | ICD-10-CM

## 2016-07-10 DIAGNOSIS — C7970 Secondary malignant neoplasm of unspecified adrenal gland: Secondary | ICD-10-CM | POA: Diagnosis not present

## 2016-07-10 DIAGNOSIS — C3411 Malignant neoplasm of upper lobe, right bronchus or lung: Secondary | ICD-10-CM | POA: Diagnosis not present

## 2016-07-10 DIAGNOSIS — M899 Disorder of bone, unspecified: Secondary | ICD-10-CM | POA: Insufficient documentation

## 2016-07-10 DIAGNOSIS — Z720 Tobacco use: Secondary | ICD-10-CM

## 2016-07-10 DIAGNOSIS — R05 Cough: Secondary | ICD-10-CM

## 2016-07-10 DIAGNOSIS — C3491 Malignant neoplasm of unspecified part of right bronchus or lung: Secondary | ICD-10-CM

## 2016-07-10 MED ORDER — GADOBENATE DIMEGLUMINE 529 MG/ML IV SOLN
20.0000 mL | Freq: Once | INTRAVENOUS | Status: AC | PRN
  Administered 2016-07-10: 20 mL via INTRAVENOUS

## 2016-07-10 MED ORDER — BENZONATATE 100 MG PO CAPS: 100.0000 mg | ORAL_CAPSULE | Freq: Three times a day (TID) | ORAL | 0 refills | Status: DC | PRN

## 2016-07-10 MED ORDER — PREDNISONE 20 MG PO TABS: 20.0000 mg | ORAL_TABLET | Freq: Every day | ORAL | 0 refills | Status: DC

## 2016-07-10 NOTE — Patient Instructions (Addendum)
[place] at [facility] Discharge Instructions  RECOMMENDATIONS MADE BY THE CONSULTANT AND ANY TEST RESULTS WILL BE SENT TO YOUR REFERRING PHYSICIAN.  You were seen today by Dr. Irene Limbo CT scan MRI  Follow up in a few weeks See Amy up front for appointments   Thank you for choosing [place] at [facility] to provide your oncology and hematology care.  To afford each patient quality time with our provider, please arrive at least 15 minutes before your scheduled appointment time.    If you have a lab appointment with the [facility] please come in thru the  Main Entrance and check in at the main information desk  You need to re-schedule your appointment should you arrive 10 or more minutes late.  We strive to give you quality time with our providers, and arriving late affects you and other patients whose appointments are after yours.  Also, if you no show three or more times for appointments you may be dismissed from the clinic at the providers discretion.     Again, thank you for choosing [facility].  Our hope is that these requests will decrease the amount of time that you wait before being seen by our physicians.       _____________________________________________________________  Should you have questions after your visit to [facility], please contact our office at [phone] between the hours of 8:30 a.m. and 4:30 p.m.  Voicemails left after 4:30 p.m. will not be returned until the following business day.  For prescription refill requests, have your pharmacy contact our office.       Resources For Cancer Patients and their Caregivers ? American Cancer Society: Can assist with transportation, wigs, general needs, runs Look Good Feel Better.        301-633-9865 ? Cancer Care: Provides financial assistance, online support groups, medication/co-pay assistance.  1-800-813-HOPE 330-035-9939) ? [place] Assists [place] cancer patients and their families through emotional , educational and financial support.  724-432-0114 ? [place] DSS Where to apply for food stamps, Medicaid and utility assistance. [phone]) 098-8805 ? RCATS: Transportation to medical appointments. (518)186-0606 ? Social Security Administration: May apply for disability if have a Stage IV cancer. [phone])350-6222 [phone] ? [employer], Disability and Transit Services: Assists with nutrition, care and transit needs. [place] Support Programs: '@10RELATIVEDAYS'$ @ > Cancer Support Group  2nd Tuesday of the month 1pm-2pm, Journey Room  > Creative Journey  3rd Tuesday of the month 1130am-1pm, Journey Room  > Look Good Feel Better  1st Wednesday of the month 10am-12 noon, Journey Room (Call [place] to register [phone])

## 2016-07-10 NOTE — Progress Notes (Signed)
Marland Kitchen  HEMATOLOGY ONCOLOGY PROGRESS NOTE  Date of service: .07/10/2016  Patient Care Team: [name], MD as PCP - General (Family Medicine) [name], MD as Consulting Physician (Cardiology) [name], MD as Consulting Physician (Cardiothoracic Surgery) [name], MD as Consulting Physician (Hematology and Oncology) [name], MD as Consulting Physician (Cardiology)  Chief complaint: Follow-up for small cell lung cancer   Diagnosis: Extensive stage small cell lung cancer radiographically NED status in December 2017-   Current Treatment: Active surveillance   SUMMARY OF ONCOLOGIC HISTORY:   Small cell lung cancer ([place])   01/18/2015 Imaging    Chest Xray- 6.8 cm x 7.6 cm RIGHT hilar mass most compatible with bronchogenic carcinoma.       01/18/2015 Imaging    CT chest- 8.7 x 8.7 cm R hilar mass encasing the R pulm artery and occluding the R upper lobe bronchus. Sm R pleural effusion; malignant or reactive. 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 34 mm x 26 mm mass in body of pancreas      01/21/2015 Imaging    MRI brain- No acute intracranial findings. No signs of metastatic disease. Minor white matter disease, stable and nonspecific.      01/26/2015 Pathology Results    Lung, biopsy, right upper lobe - SMALL CELL CARCINOMA.      01/26/2015 Pathology Results    Diagnosis FINE NEEDLE ASPIRATION: ENDOSCOPIC SPECIMEN A, EBUS LEVEL 7 NODE (SPECIMEN 1 OF 3 COLLECTED 01/26/2015) MALIGNANT CELLS PRESENT, CONSISTENT WITH SMALL CELL CARCINOMA.      01/26/2015 Pathology Results    WANG NEEDLE ASPIRATION, SPECIMEN B LEVEL 7 NODE (SPECIMEN 2 OF 3 COLLECTED 01/26/2015) MALIGNANT CELLS PRESENT, CONSISTENT WITH SMALL CELL CARCINOMA.      01/26/2015 Pathology Results    Diagnosis BRONCHIAL BRUSHING SPECIMEN C, RIGHT UPPER LOBE (SPECIMEN 3 OF 3 COLLECTED 01/26/2015) ATYPICAL CELLS PRESENT.      01/27/2015 PET scan    Large  hypermetabolic right hilar mass with mediastinal invasion and infrahilar extension, maximum standard uptake value 13.0.  Pancreatic tail mass, approximately 4.5 cm in diameter. Abdominal aortic aneurysm, 5.8 cm in diameter.      02/10/2015 Pathology Results    Diagnosis FINE NEEDLE ASPIRATION, ENDOSCOPIC, PANCREAS TAIL (SPECIMEN 1 OF 1 COLLECTED 02/10/15): MALIGNANT CELLS CONSISTENT WITH METASTATIC SMALL CELL CARCINOMA.      02/14/2015 - 03/06/2015 Chemotherapy    Cisplatin/Etoposide      03/06/2015 Treatment Plan Change    Change systemic therapy to Carboplatin based      03/07/2015 - 05/10/2015 Chemotherapy    Carboplatin/Etoposide.      04/13/2015 Imaging    resolution of pancreatic metastases, decrease in size of R perihilar mss and RUL mass. Interval response to therapy      05/10/2015 -  Chemotherapy    Change to carboplatin/irinotecan for cycle #5 and #6 secondary to significant skin toxicity presumably from etoposide      07/12/2015 -  Radiation Therapy    25 Gy to whole brain and 30 Gy to R hilar region by Dr. Lisbeth Renshaw.      09/13/2015 Imaging    CT C/A/P unchanged from 2/28. persistent prominence of peribronchovascular intersitium throughout R lung, in perihilar region, no definitive findings to suggest progression of disease on exam      11/03/2015 Imaging    MRI brain 3 mm area of restricted diffusion R posterior temporal lobe without abnormal enhancement favor acute infarct, mild dural  thickening enhancement over R hemisphere now present previously      11/15/2015 Imaging    CT C/A/P Interval development of 1.6 x 1.4 cm L adrenal nodule suspicious for metastatic lesion. No other sites of new metastatic disease are noted elsewhere in C/A/P. Evolving postradiation changes in R lung      12/01/2015 - 12/14/2015 Radiation Therapy    Radiation therapy to L adrenal nodule      01/18/2016 Imaging    CT C/A/P 1. Stable appearance of the chest. Specifically, there are post  XRT findings in the right lung with perihilar and hilar airway thickening, some upper lobe airway plugging, and scattered densities which merit observation but which are likely therapy rib related. 2. Centrilobular emphysema  3. Coronary, aortic arch, and branch vessel atherosclerotic vascular disease 4. On prior PET-CT from 01/27/2015 there was a pancreatic body mass. Prior biopsy from 02/10/2015 showed this to be consistent with metastatic small cell carcinoma. This mass is not visible on today's CT scan, and has presumably resolved with treatment. There is a transition from a somewhat atrophic pancreatic tail to a normal caliber pancreatic body occurring in the vicinity of the prior mass. 5. Reduced size of a small left adrenal mass, currently 1.2 by 1.7 cm 6. Infrarenal abdominal aortic aneurysm up to 5.8 cm diameter, with patent aorta bi-iliac stent graft in place and no evidence of endoleak 7. Lumbar spondylosis and degenerative disc disease with mild impingement at L3-4 and L4-5      02/02/2016 Imaging    No evidence of metastatic disease to the brain.Resolution of small right hemispheric fluid collection. No leptomeningeal enhancement identified on today's study. This finding may have been due to a small subdural hematoma previously which has resolved.Negative for acute infarct. Small area of restricted diffusion in the right posterior temporal lobe on the prior study likely was an area of acute infarct which has resolved.      04/13/2016 Imaging    CT CAP- 1. Relatively similar appearance of areas of right-sided radiation change. No well-defined residual or recurrent disease identified. 2. No thoracic adenopathy. 3. No evidence of metastatic disease in the abdomen or pelvis.       INTERVAL HISTORY:  Patient is here for follow-up off his extensive stage small cell lung cancer . Imaging in December 2017 showed radiographic NED status . He did not have a CT chest abdomen and MRI of  brain prior to this clinic visit as was planned . Notes no acute new headaches or focal neurological deficits . Notes a persistent and somewhat worsened significant cough with whitish phlegm for the last 3-4 weeks. No fevers or chills. No chest been no change in breathing.   previously smoked 3-4 packs per day of cigarettes and has cut down to one pack per day of cigarettes. Notes that he was given codeine by his primary care physician for cough and that this was not helpful. He has also tried over-the-counter Robitussin which was not useful.  REVIEW OF SYSTEMS:    10 Point review of systems of done and is negative except as noted above.  . Past Medical History:  Diagnosis Date  . AAA (abdominal aortic aneurysm) ([place])   . Arthritis   . COPD (chronic obstructive pulmonary disease) ([place])   . Coronary artery disease 2002   Lesion in LAD s/p angioplasty; s/p CABG x 4 Sept 2013  . Essential hypertension, benign   . Hyperlipidemia   . Myocardial infarct 1999, 2002  .  Pancreatitis   . Small cell lung cancer ([place])    Stage IIIB with extension the pancreas    . Past Surgical History:  Procedure Laterality Date  . ABDOMINAL AORTIC ENDOVASCULAR STENT GRAFT N/A 08/11/2015   Procedure: ABDOMINAL AORTIC ENDOVASCULAR STENT GRAFT;  Surgeon: Serafina Mitchell, MD;  Location: [facility] OR;  Service: Vascular;  Laterality: N/A;  . [place], 2002  . CORONARY ARTERY BYPASS GRAFT  01/02/2012   Procedure: CORONARY ARTERY BYPASS GRAFTING (CABG);  Surgeon: [name], MD;  Location: [facility];  Service: Open Heart Surgery;  Laterality: N/A;  times three using left internal mammary artery and right endoscopically harvested saphenous vein  . EUS N/A 02/10/2015   Procedure: UPPER ENDOSCOPIC ULTRASOUND (EUS) LINEAR;  Surgeon: [name], MD;  Location: [facility] ENDOSCOPY;  Service: Endoscopy;  Laterality: N/A;  . KNEE SURGERY Left    ACL repair  . LEFT HEART CATHETERIZATION WITH CORONARY ANGIOGRAM  N/A 01/01/2012   Procedure: LEFT HEART CATHETERIZATION WITH CORONARY ANGIOGRAM;  Surgeon: Hillary Bow, MD;  Location: [facility] CATH LAB;  Service: Cardiovascular;  Laterality: N/A;  . LUNG BIOPSY N/A 01/26/2015   Procedure: LUNG BIOPSY;  Surgeon: [name], MD;  Location: [facility];  Service: Thoracic;  Laterality: N/A;  . LYMPH NODE BIOPSY N/A 01/26/2015   Procedure: LYMPH NODE BIOPSY;  Surgeon: [name], MD;  Location: [place];  Service: Thoracic;  Laterality: N/A;  . MULTIPLE EXTRACTIONS WITH ALVEOLOPLASTY  02/21/2012   Procedure: MULTIPLE EXTRACION WITH ALVEOLOPLASTY;  Surgeon: Lenn Cal, DDS;  Location: [facility] ORS;  Service: Oral Surgery;  Laterality: N/A;  Extaction of tooth #'s 4,5,6,7,10,11,12,13,14,15,19,20,21,22,23,24,25,   . PORTACATH PLACEMENT Left 02/01/2015   Procedure: ATTEMPTED INSERTION OF PORT-A-CATH;  Surgeon: [name], MD;  Location: [facility];  Service: Thoracic;  Laterality: Left;  . TEE WITHOUT CARDIOVERSION  01/02/2012   Procedure: TRANSESOPHAGEAL ECHOCARDIOGRAM (TEE);  Surgeon: [name], MD;  Location: [place] [facility];  Service: Open Heart Surgery;  Laterality: N/A;  . VIDEO BRONCHOSCOPY WITH ENDOBRONCHIAL ULTRASOUND N/A 01/26/2015   Procedure: VIDEO BRONCHOSCOPY WITH ENDOBRONCHIAL ULTRASOUND;  Surgeon: [name], MD;  Location: [place];  Service: Thoracic;  Laterality: N/A;    . Social History  Substance Use Topics  . Smoking status: Current Every Day Smoker    Packs/day: 1.00    Years: 35.00    Types: Cigarettes    Start date: 06/30/1976  . Smokeless tobacco: Never Used  . Alcohol use No     Comment: Maybe a 12 pack/mth if that - very rare since 2013    ALLERGIES:  is allergic to lisinopril and tea.  MEDICATIONS:  Current Outpatient Prescriptions  Medication Sig Dispense Refill  . amLODipine (NORVASC) 5 MG tablet TAKE ONE (1) TABLET BY MOUTH EVERY DAY 90 tablet 3  . aspirin EC 81 MG tablet Take 81 mg by mouth daily.    . carvedilol  (COREG) 6.25 MG tablet TAKE ONE TABLET BY MOUTH TWICE A DAY WITH A MEAL 180 tablet 1  . pravastatin (PRAVACHOL) 40 MG tablet TAKE ONE (1) TABLET BY MOUTH EVERY DAY 90 tablet 2  . guaiFENesin (MUCINEX) 600 MG 12 hr tablet Take by mouth 2 (two) times daily.    Marland Kitchen guaiFENesin-codeine 100-10 MG/5ML syrup Take 10 mLs by mouth every 6 (six) hours as needed for cough. (Patient not taking: Reported on 07/10/2016) 140 mL 0   No current facility-administered medications for this visit.  PHYSICAL EXAMINATION: ECOG PERFORMANCE STATUS: 1 - Symptomatic but completely ambulatory  . Vitals:   07/10/16 1037  BP: 127/75  Pulse: 80  Resp: 18  Temp: 97.8 F (36.6 C)    Filed Weights   07/10/16 1037  Weight: 212 lb 4.8 oz (96.3 kg)   .Body mass index is 30.46 kg/m.  GENERAL:alert, in no acute distress and comfortable SKIN: no acute rashes, no significant lesions EYES: conjunctiva are pink and non-injected, sclera anicteric OROPHARYNX: MMM, no exudates, no oropharyngeal erythema or ulceration NECK: supple, no JVD LYMPH:  no palpable lymphadenopathy in the cervical, axillary or inguinal regions LUNGS: clear to auscultation b/l with normal respiratory effort HEART: regular rate & rhythm ABDOMEN:  normoactive bowel sounds , non tender, not distended. Extremity: no pedal edema PSYCH: alert & oriented x 3 with fluent speech NEURO: no focal motor/sensory deficits  LABORATORY DATA:   I have reviewed the data as listed  . CBC Latest Ref Rng & Units 07/04/2016 04/11/2016 11/17/2015  WBC 4.0 - 10.5 K/uL 4.8 3.8(L) 4.1  Hemoglobin 13.0 - 17.0 g/dL 12.0(L) 12.0(L) 12.0(L)  Hematocrit 39.0 - 52.0 % 35.4(L) 36.4(L) 35.1(L)  Platelets 150 - 400 K/uL 149(L) 146(L) 144(L)    . CMP Latest Ref Rng & Units 07/04/2016 04/11/2016 02/02/2016  Glucose 65 - 99 mg/dL 108(H) 101(H) -  BUN 6 - 20 mg/dL 10 12 -  Creatinine 0.61 - 1.24 mg/dL 1.08 1.17 1.20  Sodium 135 - 145 mmol/L 133(L) 134(L) -  Potassium 3.5  - 5.1 mmol/L 4.0 4.5 -  Chloride 101 - 111 mmol/L 98(L) 98(L) -  CO2 22 - 32 mmol/L 28 28 -  Calcium 8.9 - 10.3 mg/dL 8.8(L) 9.1 -  Total Protein 6.5 - 8.1 g/dL 7.5 8.0 -  Total Bilirubin 0.3 - 1.2 mg/dL 0.6 0.7 -  Alkaline Phos 38 - 126 U/L 44 49 -  AST 15 - 41 U/L 17 20 -  ALT 17 - 63 U/L 12(L) 17 -     RADIOGRAPHIC STUDIES: I have personally reviewed the radiological images as listed and agreed with the findings in the report. No results found.  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 Large hypermetabolic right hilar mass with mediastinal invasion and infrahilar extension  2) History of urgent CABG in 2013  3) Active smoker -has previously smoked 3-4 packs of cigarettes per day and is currently still smoking 1 pack per day of cigarettes.  PLAN -Patient is here for follow-up of his extensive stage small cell lung cancer which was in radiographic NED status based on CT scans in December 2017 after his treatment as detailed above. -He notes new worsening cough with whitish phlegm. Patient also cough is quite bothersome and codeine was not useful. -He needs to have his MRI of the brain and CT chest and abdomen for restaging purposes and to evaluate the status of his small cell lung cancer. -Counseled on smoking cessation with patient notices when to be difficult. -Given short course of prednisone 20  mg by mouth daily for a week and Tessalon Perles for controlling his cough which could be due to  mild bronchitis. Cannot rule out recurrent small cell lung cancer and will have to await CT chest results to evaluate this. -Labs appear stable.  Return to clinic in 2 weeks withCT chest abdomen and MRI of the brain for reevaluation of his extensive stage small cell lung cancer. Further treatment for results.  I spent 20 minutes counseling the patient face to face. The total time spent in the appointment was 25 minutes and more than 50% was on counseling and direct patient cares.    Sullivan Lone MD [place] AAHIVMS [facility] Hematology/Oncology Physician [facility]  (Office):       [phone] (Work cell):  [phone] (Fax):           [phone]

## 2016-07-20 ENCOUNTER — Ambulatory Visit (HOSPITAL_COMMUNITY)
Admission: RE | Admit: 2016-07-20 | Discharge: 2016-07-20 | Disposition: A | Discharge status: Home / Self Care | Payer: Medicaid Other | Source: Ambulatory Visit | Attending: Oncology | Admitting: Oncology

## 2016-07-20 DIAGNOSIS — C3491 Malignant neoplasm of unspecified part of right bronchus or lung: Secondary | ICD-10-CM | POA: Diagnosis not present

## 2016-07-20 DIAGNOSIS — K8689 Other specified diseases of pancreas: Secondary | ICD-10-CM

## 2016-07-20 DIAGNOSIS — K769 Liver disease, unspecified: Secondary | ICD-10-CM | POA: Insufficient documentation

## 2016-07-20 DIAGNOSIS — R590 Localized enlarged lymph nodes: Secondary | ICD-10-CM | POA: Diagnosis not present

## 2016-07-20 DIAGNOSIS — C7972 Secondary malignant neoplasm of left adrenal gland: Secondary | ICD-10-CM | POA: Insufficient documentation

## 2016-07-20 MED ORDER — IOPAMIDOL (ISOVUE-300) INJECTION 61%
100.0000 mL | Freq: Once | INTRAVENOUS | Status: AC | PRN
  Administered 2016-07-20: 100 mL via INTRAVENOUS

## 2016-07-23 NOTE — Progress Notes (Addendum)
[name], [place] [address]   CLINIC:  Medical Oncology/Hematology  PCP:  [name], MD [address] [facility] [place] [address] [phone]   REASON FOR VISIT:  Follow-up for Extensive stage small cell lung cancer  CURRENT THERAPY: Previous active surveillance, now with recurrent metastatic disease per recent restaging imaging in 06/2016.    BRIEF ONCOLOGIC HISTORY:    Small cell lung cancer ([name])   01/18/2015 Imaging    Chest Xray- 6.8 cm x 7.6 cm RIGHT hilar mass most compatible with bronchogenic carcinoma.       01/18/2015 Imaging    CT chest- 8.7 x 8.7 cm R hilar mass encasing the R pulm artery and occluding the R upper lobe bronchus. Sm R pleural effusion; malignant or reactive. 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 34 mm x 26 mm mass in body of pancreas      01/21/2015 Imaging    MRI brain- No acute intracranial findings. No signs of metastatic disease. Minor white matter disease, stable and nonspecific.      01/26/2015 Pathology Results    Lung, biopsy, right upper lobe - SMALL CELL CARCINOMA.      01/26/2015 Pathology Results    Diagnosis FINE NEEDLE ASPIRATION: ENDOSCOPIC SPECIMEN A, EBUS LEVEL 7 NODE (SPECIMEN 1 OF 3 COLLECTED 01/26/2015) MALIGNANT CELLS PRESENT, CONSISTENT WITH SMALL CELL CARCINOMA.      01/26/2015 Pathology Results    WANG NEEDLE ASPIRATION, SPECIMEN B LEVEL 7 NODE (SPECIMEN 2 OF 3 COLLECTED 01/26/2015) MALIGNANT CELLS PRESENT, CONSISTENT WITH SMALL CELL CARCINOMA.      01/26/2015 Pathology Results    Diagnosis BRONCHIAL BRUSHING SPECIMEN C, RIGHT UPPER LOBE (SPECIMEN 3 OF 3 COLLECTED 01/26/2015) ATYPICAL CELLS PRESENT.      01/27/2015 PET scan    Large hypermetabolic right hilar mass with mediastinal invasion and infrahilar extension, maximum standard uptake value 13.0.  Pancreatic tail mass, approximately 4.5 cm in diameter. Abdominal aortic aneurysm, 5.8 cm in  diameter.      02/10/2015 Pathology Results    Diagnosis FINE NEEDLE ASPIRATION, ENDOSCOPIC, PANCREAS TAIL (SPECIMEN 1 OF 1 COLLECTED 02/10/15): MALIGNANT CELLS CONSISTENT WITH METASTATIC SMALL CELL CARCINOMA.      02/14/2015 - 03/06/2015 Chemotherapy    Cisplatin/Etoposide      03/06/2015 Treatment Plan Change    Change systemic therapy to Carboplatin based      03/07/2015 - 05/10/2015 Chemotherapy    Carboplatin/Etoposide.      04/13/2015 Imaging    resolution of pancreatic metastases, decrease in size of R perihilar mss and RUL mass. Interval response to therapy      05/10/2015 - 05/31/2015 Chemotherapy    Change to carboplatin/irinotecan for cycle #5 and #6 secondary to significant skin toxicity presumably from etoposide      07/12/2015 -  Radiation Therapy    25 Gy to whole brain and 30 Gy to R hilar region by Dr. Lisbeth Renshaw.      09/13/2015 Imaging    CT C/A/P unchanged from 2/28. persistent prominence of peribronchovascular intersitium throughout R lung, in perihilar region, no definitive findings to suggest progression of disease on exam      11/03/2015 Imaging    MRI brain 3 mm area of restricted diffusion R posterior temporal lobe without abnormal enhancement favor acute infarct, mild dural thickening enhancement over R hemisphere now present previously      11/15/2015 Imaging    CT C/A/P Interval development of 1.6  x 1.4 cm L adrenal nodule suspicious for metastatic lesion. No other sites of new metastatic disease are noted elsewhere in C/A/P. Evolving postradiation changes in R lung      12/01/2015 - 12/14/2015 Radiation Therapy    Radiation therapy to L adrenal nodule      01/18/2016 Imaging    CT C/A/P 1. Stable appearance of the chest. Specifically, there are post XRT findings in the right lung with perihilar and hilar airway thickening, some upper lobe airway plugging, and scattered densities which merit observation but which are likely therapy rib related. 2.  Centrilobular emphysema  3. Coronary, aortic arch, and branch vessel atherosclerotic vascular disease 4. On prior PET-CT from 01/27/2015 there was a pancreatic body mass. Prior biopsy from 02/10/2015 showed this to be consistent with metastatic small cell carcinoma. This mass is not visible on today's CT scan, and has presumably resolved with treatment. There is a transition from a somewhat atrophic pancreatic tail to a normal caliber pancreatic body occurring in the vicinity of the prior mass. 5. Reduced size of a small left adrenal mass, currently 1.2 by 1.7 cm 6. Infrarenal abdominal aortic aneurysm up to 5.8 cm diameter, with patent aorta bi-iliac stent graft in place and no evidence of endoleak 7. Lumbar spondylosis and degenerative disc disease with mild impingement at L3-4 and L4-5      02/02/2016 Imaging    No evidence of metastatic disease to the brain.Resolution of small right hemispheric fluid collection. No leptomeningeal enhancement identified on today's study. This finding may have been due to a small subdural hematoma previously which has resolved.Negative for acute infarct. Small area of restricted diffusion in the right posterior temporal lobe on the prior study likely was an area of acute infarct which has resolved.      04/13/2016 Imaging    CT CAP- 1. Relatively similar appearance of areas of right-sided radiation change. No well-defined residual or recurrent disease identified. 2. No thoracic adenopathy. 3. No evidence of metastatic disease in the abdomen or pelvis.      07/10/2016 Imaging    MRI brain- 1. No evidence of intracranial metastatic disease within limitations of mild motion artifact. 2. Unchanged subcentimeter lesion in the clivus. Small osseous metastasis not excluded.      07/20/2016 Imaging    CT CAP- 9.5 x 4.6 cm central right upper lobe mass extending to the perihilar region, new, compatible with lung cancer recurrence. Associated subpleural  nodularity in the right upper lobe, suspicious for metastatic disease.  Associated mild mediastinal lymphadenopathy, compatible with nodal metastases.  Multifocal hepatic lesions in both lobes, new, compatible with hepatic metastases, with index lesions as above.  Additional ancillary findings as above.      07/20/2016 Relapse/Recurrence    Last Treatment Date: 12/14/15 XRT; 06/10/15 Chemotherapy Recent Lab Values: Recent Lab Values:  N/A        INTERVAL HISTORY:  [name] 56 y.o. male returns for follow for extensive stage small cell lung cancer.   He recently underwent restaging scans with CT chest/abd/pelvis and MRI brain revealing recurrent disease in the lung and multiple liver lesions suspicious for metastatic disease.  The patient is here today with his wife and daughter; they read the results of the recent CT scans on MyChart and want to discuss those results together today for better understanding.    Physically, his biggest complaints today are cough and congestion.  He states that he took the prednisone and tessalon perles, which were not helpful.  The cough syrup with codeine did not help the cough either.    His appetite is 100%. He reports back pain 4/10 (which is chronic). Endorses easy bruising/bleeding.     REVIEW OF SYSTEMS:  Review of Systems  Constitutional: Negative.  Negative for chills, fatigue and fever.  HENT:  Negative.  Negative for lump/mass and nosebleeds.   Eyes: Negative.   Respiratory: Positive for cough. Negative for shortness of breath.   Cardiovascular: Negative.  Negative for chest pain and leg swelling.  Gastrointestinal: Negative.  Negative for abdominal pain, blood in stool, constipation (some constipation with codeine cough syrup, but this resolved after stopping cough medicine ), diarrhea, nausea and vomiting.  Endocrine: Negative.   Genitourinary: Negative.  Negative for dysuria and hematuria.   Musculoskeletal: Positive for back pain.   Skin: Negative.  Negative for rash.  Neurological: Negative.  Negative for dizziness and headaches.  Hematological: Negative for adenopathy. Bruises/bleeds easily.  Psychiatric/Behavioral: Negative.  Negative for depression and sleep disturbance. The patient is not nervous/anxious.      PAST MEDICAL/SURGICAL HISTORY:  Past Medical History:  Diagnosis Date  . AAA (abdominal aortic aneurysm) ([place])   . Arthritis   . COPD (chronic obstructive pulmonary disease) ([place])   . Coronary artery disease 2002   Lesion in LAD s/p angioplasty; s/p CABG x 4 Sept 2013  . Essential hypertension, benign   . Hyperlipidemia   . Myocardial infarct 1999, 2002  . Pancreatitis   . Small cell lung cancer ([place])    Stage IIIB with extension the pancreas   Past Surgical History:  Procedure Laterality Date  . ABDOMINAL AORTIC ENDOVASCULAR STENT GRAFT N/A 08/11/2015   Procedure: ABDOMINAL AORTIC ENDOVASCULAR STENT GRAFT;  Surgeon: Serafina Mitchell, MD;  Location: [facility] OR;  Service: Vascular;  Laterality: N/A;  . [place], 2002  . CORONARY ARTERY BYPASS GRAFT  01/02/2012   Procedure: CORONARY ARTERY BYPASS GRAFTING (CABG);  Surgeon: [name], MD;  Location: [place];  Service: Open Heart Surgery;  Laterality: N/A;  times three using left internal mammary artery and right endoscopically harvested saphenous vein  . EUS N/A 02/10/2015   Procedure: UPPER ENDOSCOPIC ULTRASOUND (EUS) LINEAR;  Surgeon: [name], MD;  Location: [facility] ENDOSCOPY;  Service: Endoscopy;  Laterality: N/A;  . KNEE SURGERY Left    ACL repair  . LEFT HEART CATHETERIZATION WITH CORONARY ANGIOGRAM N/A 01/01/2012   Procedure: LEFT HEART CATHETERIZATION WITH CORONARY ANGIOGRAM;  Surgeon: Hillary Bow, MD;  Location: [facility] CATH LAB;  Service: Cardiovascular;  Laterality: N/A;  . LUNG BIOPSY N/A 01/26/2015   Procedure: LUNG BIOPSY;  Surgeon: [name], MD;  Location: [place];  Service: Thoracic;  Laterality: N/A;  . LYMPH  NODE BIOPSY N/A 01/26/2015   Procedure: LYMPH NODE BIOPSY;  Surgeon: [name], MD;  Location: [place];  Service: Thoracic;  Laterality: N/A;  . MULTIPLE EXTRACTIONS WITH ALVEOLOPLASTY  02/21/2012   Procedure: MULTIPLE EXTRACION WITH ALVEOLOPLASTY;  Surgeon: Lenn Cal, DDS;  Location: [facility] ORS;  Service: Oral Surgery;  Laterality: N/A;  Extaction of tooth #'s 4,5,6,7,10,11,12,13,14,15,19,20,21,22,23,24,25,   . PORTACATH PLACEMENT Left 02/01/2015   Procedure: ATTEMPTED INSERTION OF PORT-A-CATH;  Surgeon: [name], MD;  Location: [place];  Service: Thoracic;  Laterality: Left;  . TEE WITHOUT CARDIOVERSION  01/02/2012   Procedure: TRANSESOPHAGEAL ECHOCARDIOGRAM (TEE);  Surgeon: [name], MD;  Location: [place];  Service: Open Heart Surgery;  Laterality: N/A;  . VIDEO BRONCHOSCOPY WITH  ENDOBRONCHIAL ULTRASOUND N/A 01/26/2015   Procedure: VIDEO BRONCHOSCOPY WITH ENDOBRONCHIAL ULTRASOUND;  Surgeon: [name], MD;  Location: [place];  Service: Thoracic;  Laterality: N/A;     SOCIAL HISTORY:  Social History   Social History  . Marital status: Married    Spouse name: N/A  . Number of children: 5  . Years of education: N/A   Occupational History  . Works in [place] [employer]  . Smoking status: Current Every Day Smoker    Packs/day: 1.00    Years: 35.00    Types: Cigarettes    Start date: 06/30/1976  . Smokeless tobacco: Never Used  . Alcohol use No     Comment: Maybe a 12 pack/mth if that - very rare since 2013  . Drug use: No  . Sexual activity: Yes   Other Topics Concern  . Not on file   Social History Narrative  . No narrative on file    FAMILY HISTORY:  Family History  Problem Relation Age of Onset  . Heart disease Father   . Diabetes Father   . Hyperlipidemia Father   . Hypertension Father   . Hyperlipidemia Mother   . Hypertension Mother   . Stroke Mother   . Diabetes Paternal Grandmother   . Cancer Neg Hx      CURRENT MEDICATIONS:  Outpatient Encounter Prescriptions as of 07/24/2016  Medication Sig  . amLODipine (NORVASC) 5 MG tablet TAKE ONE (1) TABLET BY MOUTH EVERY DAY  . aspirin EC 81 MG tablet Take 81 mg by mouth daily.  . benzonatate (TESSALON PERLES) 100 MG capsule Take 1 capsule (100 mg total) by mouth 3 (three) times daily as needed for cough.  . carvedilol (COREG) 6.25 MG tablet TAKE ONE TABLET BY MOUTH TWICE A DAY WITH A MEAL  . guaiFENesin (MUCINEX) 600 MG 12 hr tablet Take by mouth 2 (two) times daily.  Marland Kitchen guaiFENesin-codeine 100-10 MG/5ML syrup Take 10 mLs by mouth every 6 (six) hours as needed for cough.  . pravastatin (PRAVACHOL) 40 MG tablet TAKE ONE (1) TABLET BY MOUTH EVERY DAY  . predniSONE (DELTASONE) 20 MG tablet Take 1 tablet (20 mg total) by mouth daily with breakfast.   No facility-administered encounter medications on file as of 07/24/2016.     ALLERGIES:  Allergies  Allergen Reactions  . Lisinopril Cough  . Tea Hives     PHYSICAL EXAM:  ECOG Performance status: 1 - Symptomatic, but independent.   Vitals:   07/24/16 0951  BP: 130/80  Pulse: 73  Resp: 16  Temp: 97.6 F (36.4 C)   Filed Weights   07/24/16 0951  Weight: 208 lb 9.6 oz (94.6 kg)    Physical Exam  Constitutional: He is oriented to person, place, and time and well-developed, well-nourished, and in no distress.  HENT:  Head: Normocephalic.  Mouth/Throat: Oropharynx is clear and moist. No oropharyngeal exudate.  Eyes: Conjunctivae are normal. Pupils are equal, round, and reactive to light. No scleral icterus.  Neck: Normal range of motion. Neck supple.  Cardiovascular: Normal rate, regular rhythm and normal heart sounds.   Pulmonary/Chest: Effort normal. No respiratory distress. He has no wheezes.  RUL with mild rhonchi   Abdominal: Soft. Bowel sounds are normal. There is no tenderness. There is no rebound and no guarding.  Musculoskeletal: Normal range of motion. He exhibits no edema.   Lymphadenopathy:    He has no cervical adenopathy.  Neurological: He is alert and oriented  to person, place, and time. No cranial nerve deficit. Gait normal.  Skin: Skin is warm and dry. No rash noted.  Psychiatric: His mood appears anxious. He exhibits a depressed mood.  Appropriately tearful at times during visit   Nursing note and vitals reviewed.    LABORATORY DATA:  I have reviewed the labs as listed.  CBC    Component Value Date/Time   WBC 4.8 07/04/2016 1017   RBC 3.99 (L) 07/04/2016 1017   HGB 12.0 (L) 07/04/2016 1017   HCT 35.4 (L) 07/04/2016 1017   PLT 149 (L) 07/04/2016 1017   MCV 88.7 07/04/2016 1017   MCH 30.1 07/04/2016 1017   MCHC 33.9 07/04/2016 1017   RDW 14.2 07/04/2016 1017   LYMPHSABS 1.2 07/04/2016 1017   MONOABS 0.5 07/04/2016 1017   EOSABS 0.2 07/04/2016 1017   BASOSABS 0.0 07/04/2016 1017   CMP Latest Ref Rng & Units 07/04/2016 04/11/2016 02/02/2016  Glucose 65 - 99 mg/dL 108(H) 101(H) -  BUN 6 - 20 mg/dL 10 12 -  Creatinine 0.61 - 1.24 mg/dL 1.08 1.17 1.20  Sodium 135 - 145 mmol/L 133(L) 134(L) -  Potassium 3.5 - 5.1 mmol/L 4.0 4.5 -  Chloride 101 - 111 mmol/L 98(L) 98(L) -  CO2 22 - 32 mmol/L 28 28 -  Calcium 8.9 - 10.3 mg/dL 8.8(L) 9.1 -  Total Protein 6.5 - 8.1 g/dL 7.5 8.0 -  Total Bilirubin 0.3 - 1.2 mg/dL 0.6 0.7 -  Alkaline Phos 38 - 126 U/L 44 49 -  AST 15 - 41 U/L 17 20 -  ALT 17 - 63 U/L 12(L) 17 -    PENDING LABS:    DIAGNOSTIC IMAGING:  MRI brain: 07/10/16     Most recent CT chest/abd/pelvis: 07/20/16        PATHOLOGY:  RUL lung biopsy: 01/26/15     ASSESSMENT & PLAN:   Extensive stage small cell lung cancer:  -Diagnosed in 01/2015. Initially treated with Cisplatin/Etoposide, which was switched to Carbo/Etoposide d/t toxicities. Treatment then changed to Carbo/Irinotecan for cycles #5 & #6 d/t skin toxicity; completed chemotherapy on 05/31/15. Went on to have prophylactic cranial irradiation in 06/2015. Subsequent  CT imaging in 10/2015, noted (L) adrenal nodule suspicious for metastatic disease; he underwent radiation to (L) adrenal mass in 11/2015, completing treatment on 12/14/15. CT chest/abd/pelvis and MRI brain in 03/2016 with no residual or recurrent disease and no evidence of metastatic disease noted.  -CT chest/abd/pelvis on 07/20/16 with new large 9.5 x 4.6 central RUL mass that extends into perihilar region, consistent with lung cancer recurrent. There are associated subpleural nodularities & mild mediastinal lymphadenopathy suspicious for metastatic disease. Multiple hepatic metastases noted as well. MRI brain on 07/10/16 without evidence of intracranial metastatic disease; there is small, sub-cm stable lesion to clivus where small area of metastasis cannot be excluded; discussed with Dr. Talbert Cage and this lesion has been stable for ~1 year; we will continue to monitor on serial staging scans.  -I reviewed the results of these scans in detail with the patient and his family today.  They were provided paper copies of the radiology reports as well. Case discussed with Dr. Talbert Cage.  -[name] and his family understand that the cancer is not curable, but remains treatable. We reviewed staging for small cell lung cancers; [name] has extensive stage (equivalent to Stage IV) disease.  He understands that extensive stage small cell lung cancer is often very aggressive and can make treatment difficult; of course the  extent of disease with higher burden of disease makes treatment challenging as well.  However, even extensive stage small cell lung cancer is very treatable and remission can be obtained; I reinforced that [name] had a decent time of remission since his last chemotherapy and radiation treatments.  Patient became emotional during our discussion about life expectancy and left the room.  I expressed that our goals are for remission and the first 2-3 cycles of chemotherapy will help [place] know how well the cancer will  respond this time.   -Since it has been >6 months since his last chemotherapy, we will re-challenge him with Carboplatin/Irinotecan (the last chemo combination he received in 05/2015). We reviewed possible side effects of this chemo regimen; he will get reinforced chemo teaching prior during cycle #1.   -Return to cancer center on Monday, 07/30/16 to start chemotherapy with Carbo/Irinotecan IV on days 1, 8, & 15 every 28 day cycle. Treatment plan created.   (Of note, patient previously unable to have port-a-cath or PICC line placement given cardiac history and location of lung mass. We will proceed with peripheral IV placement for each cycle of chemotherapy. Patient agrees with this plan).    Cough:  -Likely secondary to large RUL mass given no improvement in symptoms with steroids and anti-tussives. Mild RUL rhonchi on exam. O2 sats stable and 99% on room air.  No additional interventions at this time. The hope is chemotherapy will shrink down the tumor and provide symptom relief of the coughing.   Adjustment disorder with anxious/depressed mood:  -[name] became emotional during much of our discussions today. His wife shared that one of his goals is to "live long enough to see his last 2 children graduate from high school"; 1 child is a Equities trader in high school and the other is Paramedic. He wants "to be around to see them play football and watch them graduate and go to college."  Emotional support was provided to the patient and his family today, as they were all understandably emotional receiving bad news today.  I reinforced that we would continue to support them all going forward as we resume treatment.      Dispo:  -Return to cancer center on Monday, 07/30/16 for cycle #1 Carbo/Irinotecan.  -He will need restaging imaging after 2-3 cycles of chemotherapy to monitor response.    All questions were answered to patient's stated satisfaction. Encouraged patient to call with any new concerns or  questions before his next visit to the cancer center and we can certain see him sooner, if needed.    Plan of care discussed with Dr. Twana First, who agrees with the above aforementioned.  She also saw the patient and his family briefly today as well.   A total of 50 minutes was spent in face-to-face care of this patient, with greater than 50% of that time spent in counseling and care-coordination.    Orders placed this encounter:  No orders of the defined types were placed in this encounter.     [name] Craze, NP [place] [phone]

## 2016-07-24 ENCOUNTER — Encounter (HOSPITAL_COMMUNITY): Payer: Medicaid Other | Attending: Adult Health | Admitting: Adult Health

## 2016-07-24 ENCOUNTER — Encounter (HOSPITAL_COMMUNITY): Payer: Self-pay | Admitting: Adult Health

## 2016-07-24 VITALS — BP 130/80 | HR 73 | Temp 97.6°F | Resp 16 | Wt 208.6 lb

## 2016-07-24 DIAGNOSIS — C787 Secondary malignant neoplasm of liver and intrahepatic bile duct: Secondary | ICD-10-CM

## 2016-07-24 DIAGNOSIS — R599 Enlarged lymph nodes, unspecified: Secondary | ICD-10-CM

## 2016-07-24 DIAGNOSIS — M545 Low back pain: Secondary | ICD-10-CM | POA: Diagnosis not present

## 2016-07-24 DIAGNOSIS — G8929 Other chronic pain: Secondary | ICD-10-CM | POA: Diagnosis not present

## 2016-07-24 DIAGNOSIS — G939 Disorder of brain, unspecified: Secondary | ICD-10-CM | POA: Diagnosis not present

## 2016-07-24 DIAGNOSIS — C3491 Malignant neoplasm of unspecified part of right bronchus or lung: Secondary | ICD-10-CM

## 2016-07-24 DIAGNOSIS — K869 Disease of pancreas, unspecified: Secondary | ICD-10-CM | POA: Insufficient documentation

## 2016-07-24 DIAGNOSIS — C3411 Malignant neoplasm of upper lobe, right bronchus or lung: Secondary | ICD-10-CM

## 2016-07-24 DIAGNOSIS — Z720 Tobacco use: Secondary | ICD-10-CM

## 2016-07-24 DIAGNOSIS — C3490 Malignant neoplasm of unspecified part of unspecified bronchus or lung: Secondary | ICD-10-CM | POA: Insufficient documentation

## 2016-07-24 DIAGNOSIS — R05 Cough: Secondary | ICD-10-CM

## 2016-07-24 DIAGNOSIS — F4323 Adjustment disorder with mixed anxiety and depressed mood: Secondary | ICD-10-CM | POA: Diagnosis not present

## 2016-07-24 NOTE — Progress Notes (Addendum)
START OFF PATHWAY REGIMEN - Small Cell Lung   [id number]:Carboplatin + Irinotecan:   A cycle is every 28 days:     Irinotecan      Carboplatin   **Always confirm dose/schedule in your pharmacy ordering system**    Clinician Citation: Patient previously did not tolerate Etoposide or Cisplatin. Was able to tolerate Carbo/Irinotecan. Last chemo 05/2015.  Patient Characteristics: Extensive and Limited Stage, Second Line, Relapse > 6 Months Stage Grouping: Extensive AJCC T Category: T4 AJCC N Category: N2 AJCC M Category: M1b AJCC 8 Stage Grouping: IVA Line of therapy: Second Line Would you be surprised if this patient died  in the next year? I would NOT be surprised if this patient died in the next year Time to Relapse: Relapse > 6 Months  Intent of Therapy: Non-Curative / Palliative Intent, Discussed with Patient

## 2016-07-24 NOTE — Patient Instructions (Addendum)
[place] at [facility] Discharge Instructions  RECOMMENDATIONS MADE BY THE CONSULTANT AND ANY TEST RESULTS WILL BE SENT TO YOUR REFERRING PHYSICIAN.  You saw Mike Craze, NP, today Start chemo on Monday- 07/30/16 See Amy at checkout for appointments.  Thank you for choosing [place] at [facility] to provide your oncology and hematology care.  To afford each patient quality time with our provider, please arrive at least 15 minutes before your scheduled appointment time.    If you have a lab appointment with the [facility] please come in thru the  Main Entrance and check in at the main information desk  You need to re-schedule your appointment should you arrive 10 or more minutes late.  We strive to give you quality time with our providers, and arriving late affects you and other patients whose appointments are after yours.  Also, if you no show three or more times for appointments you may be dismissed from the clinic at the providers discretion.     Again, thank you for choosing [facility]  Our hope is that these requests will decrease the amount of time that you wait before being seen by our physicians.       _____________________________________________________________  Should you have questions after your visit to [facility], please contact our office at [phone] between the hours of 8:30 a.m. and 4:30 p.m.  Voicemails left after 4:30 p.m. will not be returned until the following business day.  For prescription refill requests, have your pharmacy contact our office.       Resources For Cancer Patients and their Caregivers ? American Cancer Society: Can assist with transportation, wigs, general needs, runs Look Good Feel Better.        4507951452 ? Cancer Care: Provides financial assistance, online support groups, medication/co-pay assistance.  1-800-813-HOPE 7654591952) ? [place] Assists [place] cancer patients and their families through emotional , educational and financial support.  3851359731 ? [place] DSS Where to apply for food stamps, Medicaid and utility assistance. 425-872-2647 ? [facility]: Transportation to medical appointments. (830)040-5614 ? Social Security Administration: May apply for disability if have a Stage IV cancer. 2161722343 715-551-1889 ? [employer], Disability and Transit Services: Assists with nutrition, care and transit needs. [place] Support Programs: '@10RELATIVEDAYS'$ @ > Cancer Support Group  2nd Tuesday of the month 1pm-2pm, Journey Room  > Creative Journey  3rd Tuesday of the month 1130am-1pm, Journey Room  > Look Good Feel Better  1st Wednesday of the month 10am-12 noon, Journey Room (Call [place] to register [phone])

## 2016-07-25 ENCOUNTER — Other Ambulatory Visit (HOSPITAL_COMMUNITY): Payer: Self-pay | Admitting: Emergency Medicine

## 2016-07-25 MED ORDER — ONDANSETRON HCL 8 MG PO TABS: 8.0000 mg | ORAL_TABLET | Freq: Two times a day (BID) | ORAL | 1 refills | Status: DC | PRN

## 2016-07-25 MED ORDER — PROCHLORPERAZINE MALEATE 10 MG PO TABS: 10.0000 mg | ORAL_TABLET | Freq: Four times a day (QID) | ORAL | 1 refills | Status: DC | PRN

## 2016-07-25 MED ORDER — DEXAMETHASONE 4 MG PO TABS: 8.0000 mg | ORAL_TABLET | Freq: Every day | ORAL | 5 refills | Status: DC

## 2016-07-25 MED ORDER — LOPERAMIDE HCL 2 MG PO TABS: ORAL_TABLET | ORAL | 1 refills | Status: DC

## 2016-07-25 NOTE — Patient Instructions (Addendum)
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  We are going to treat you with irinotecan and carboplatin.  You will get irinotecan and carboplatin on day 1, irinotecan on days 8 and 15, and have 1 week off.  This treatment is with palliative intent, which means you are treatable but not curable.   You will see the doctor regularly throughout treatment.  We monitor your lab work prior to every treatment.  The doctor monitors your response to treatment by the way you are feeling, your blood work, and scans periodically.   You will get the following premedications prior to every chemotherapy: Premeds: Aloxi - high powered nausea/vomiting prevention medication used for chemotherapy patients. Dexamethasone - steroid - given to reduce the risk of you having an allergic type reaction to the chemotherapy. Dex can cause you to feel energized, nervous/anxious/jittery, make you have trouble sleeping, and/or make you feel hot/flushed in the face/neck and/or look pink/red in the face/neck. These side effects will pass as the Dex wears off. (takes 20 minutes to infuse)   POTENTIAL SIDE EFFECTS OF TREATMENT:  Carboplatin (Paraplatin, CBDCA)  About This Drug Carboplatin is used to treat cancer. It is given in the vein (IV).  This will take 30 minutes to infuse.   Possible Side Effects . Nausea and throwing up (vomiting) . Bone marrow depression. This is a decrease in the number of white blood cells, red blood cells, and platelets. This may raise your risk of infection, make you tired and weak (fatigue), and raise your risk of bleeding. . Changes in your kidney function . Electrolyte changes . Pain . Changes in your liver function Note: Each of the side effects above was reported in 20% or greater of patients treated with carboplatin. Not all possible side effects are included above.  Warnings and  Precautions . Severe bone marrow depression . Allergic reactions, including anaphylaxis are rare but may happen in some patients. Signs of allergic reaction to this drug may be swelling of the face, feeling like your tongue or throat are swelling, trouble breathing, rash, itching, fever, chills, feeling dizzy, and/or feeling that your heart is beating in a fast or not normal way. If this happens, do not take another dose of this drug. You should get urgent medical treatment. . Severe nausea and throwing up (vomiting) . Effects on the nerves are called peripheral neuropathy. This risk is increased if you are over the age of 41 or if you have received other medicine with risk of peripheral neuropathy. You may feel numbness, tingling, or pain in your hands and feet. It may be hard for you to button your clothes, open jars, or walk as usual. The effect on the nerves may get worse with more doses of the drug. These effects get better in some people after the drug is stopped but it does not get better in all people. Marland Kitchen Blurred vision, loss of vision or other changes in eyesight . Decreased hearing . Skin and tissue irritation including redness, pain, warmth, or swelling at the IV site if the drug leaks out of the vein and into nearby tissue. . Changes in your kidney function, which can cause kidney failure . Changes in your liver function, which can cause liver failure Note: Some of the side effects above are very rare. If you have concerns and/or questions, please discuss them with your medical team.  Important  Information . This drug may be present in the saliva, tears, sweat, urine, stool, vomit, semen, and vaginal secretions. Talk to your doctor and/or your nurse about the necessary precautions to take during this time.  Treating Side Effects . Manage tiredness by pacing your activities for the day. . Be sure to include periods of rest between energy-draining activities. . To decrease  infection, wash your hands regularly. . Avoid close contact with people who have a cold, the flu, or other infections. . Take your temperature as your doctor or nurse tells you, and whenever you feel like you may have a fever. . To help decrease bleeding, use a soft toothbrush. Check with your nurse before using dental floss. . Be very careful when using knives or tools. . Use an electric shaver instead of a razor. . Drink plenty of fluids (a minimum of eight glasses per day is recommended). . If you throw up or have loose bowel movements, you should drink more fluids so that you do not become dehydrated (lack water in the body from losing too much fluid). . To help with nausea and vomiting, eat small, frequent meals instead of three large meals a day. Choose foods and drinks that are at room temperature. Ask your nurse or doctor about other helpful tips and medicine that is available to help or stop lessen these symptoms. . If you have numbness and tingling in your hands and feet, be careful when cooking, walking, and handling sharp objects and hot liquids. Marland Kitchen Keeping your pain under control is important to your well-being. Please tell your doctor or nurse if you are experiencing pain.  Food and Drug Interactions . There are no known interactions of carboplatin with food or with other medications. . Tell your doctor and pharmacist about all the medicines and dietary supplements (vitamins, minerals, herbs and others) that you are taking at this time.The safety and use of dietary supplements and alternative diets are often not known. Using these might affect your cancer or interfere with your treatment. Until more is known, you should not use dietary supplements or alternative diets without your cancer doctor's help.  When to Call the Doctor Call your doctor or nurse if you have any of these symptoms and/or any new or unusual symptoms: . Fever of 100.5 F (38 C) or higher . Chills .  Fatigue that interferes with your daily activities . Feeling dizzy or lightheaded . Easy bleeding or bruising . Nausea that stops you from eating or drinking and/or is not relieved by prescribed medicines . Throwing up more than 3 times a day . Blurred vision or other changes in eyesight . Decrease in hearing or ringing in the ear . Signs of allergic reaction: swelling of the face, feeling like your tongue or throat are swelling, trouble breathing, rash, itching, fever, chills, feeling dizzy, and/or feeling that your heart is beating in a fast or not normal way . While you are getting this drug, please tell your nurse right away if you have any pain, redness, or swelling at the site of the IV infusion . Signs of possible liver problems: dark urine, pale bowel movements, bad stomach pain, feeling very tired and weak, unusual itching, or yellowing of the eyes or skin . Decreased urine, or very dark urine . Numbness, tingling, or pain your hands and feet . Pain that does not go away or is not relieved by prescribed medicine . If you think you may be pregnant  Reproduction Warnings . Pregnancy  warning: This drug may have harmful effects on the unborn baby. Women of child bearing potential should use effective methods of birth control during your cancer treatment. Let your doctor know right away if you think you may be pregnant. . Breastfeeding warning: It is not known if this drug passes into breast milk. For this reason, women should not breast feed during treatment because this drug could enter the breast milk and cause harm to a breast feeding baby. . Fertility warning: Human fertility studies have not been done with this drug. Talk with your doctor or nurse if you plan to have children. Ask for information on sperm or egg banking.  Irinotecan Hydrochloride (Generic Name) Other Names: Camptosar, camptothecin-11, CPT-11  About this drug Irinotecan hydrochloride is used to treat  cancer. This drug is given in the vein (IV).  This will take 90 minutes to infuse.    Possible side effects (more common) . Loose bowel movements (diarrhea) that may last for a few days . Bone marrow depression. This is a decrease in the number of white blood cells, red blood cells, and platelets. This may raise your risk of infection, make you tired and weak (fatigue), and raise your risk of bleeding. . Nausea and throwing up (vomiting). These symptoms may happen within a few hours or many hours after your treatment and may last up to 24 hours. Medicines are available to stop or lessen these side effects. . Hair loss: Most often hair loss is temporary; your hair should grow back when treatment is done.  Possible side effects (less common) . Skin and tissue irritation. You may have redness, pain, warmth, or swelling at the IV site. . Weakness . Trouble Breathing . Decreased Appetite (decreased hunger)  Treating side effects . Drink 6-8 cups of fluids each day unless your doctor has told you to limit your fluid intake due to some other health problem. A cup is 8 ounces of fluid. If you throw up or have loose bowel movements, you should drink more fluids so that you do not become dehydrated (lack water in the body from losing too much fluid). . Ask your doctor or nurse about medicine that is available to help stop or lessen the loose bowel movements. . Talk with your nurse about getting a wig before you lose your hair. Also, call the [facility] at 800-ACS-2345 to find out information about the "Look Good, Feel Better" program close to where you live. It is a free program where women getting chemotherapy can learn about wigs, turbans and scarves as well as makeup techniques and skin and nail care. . Use effective methods of birth control during your cancer treatment. Talk with your doctor or nurse about options for birth control. . Vaginal lubricants can be used to lessen  vaginal dryness, itching, and pain during sexual relations.  Food and drug interactions There are no known interactions of irinotecan hydrochloride with food. This drug may interact with other medicines. Tell your doctor and pharmacist about all the medicines and dietary supplements (vitamins, minerals, herbs and others) that you are taking at this time. The safety and use of dietary supplements and alternative diets are often not known. Using these might affect your cancer or interfere with your treatment. Until more is known, you should not use dietary supplements or alternative diets without your cancer doctor's help.  When to call the doctor Call your doctor or nurse right away if you have any of these symptoms: . Loose bowel  movements (diarrhea) more than 5 or 6 times a day or loose bowel movements with weakness or feeling lightheaded . Temperature of 100.4 F (38 C) or above . Chills . Trouble breathing or feeling short of breath . Easy bruising or bleeding . Nausea that stops you from eating or drinking . Throwing up more than three times in one day . Redness, pain, warmth, or swelling at the IV site . Feeling dizzy, lightheaded, or if you pass out Call your doctor or nurse as soon as possible if you have any of these symptoms: . Nausea that is not relieved by prescribed medicines . Extreme tiredness that interferes with normal activities  Sexual problems and reproduction concerns . Infertility warning: Sexual problems and reproduction concerns may happen. In both men and women, this drug may affect your ability to have children. This cannot be determined before your treatment. Talk with your doctor or nurse if you plan to have children. Ask for information on sperm or egg banking. . In men, this drug may interfere with your ability to make sperm, but it should not change your ability to have sexual relations. . In women, menstrual bleeding may become irregular or stop while  you are getting this drug. Do not assume that you cannot become pregnant if you do not have a menstrual period. . Women may go through signs of menopause (change of life) like vaginal dryness or itching. Vaginal lubricants can be used to lessen vaginal dryness, itching, and pain during sexual relations. . Genetic counseling is available for you to talk about the effects of this drug therapy on future pregnancies. Also, a genetic counselor can look at the possible risk of problems in the unborn baby due to this medicine if an exposure happens during pregnancy. . Pregnancy warning: This drug may have harmful effects on the unborn child, so effective methods of birth control should be used during your cancer treatment. . Breast feeding warning: Women should not breast feed during treatment because this drug could enter the breast milk and badly harm a breast feeding baby.    SELF CARE ACTIVITIES WHILE ON CHEMOTHERAPY: Hydration Increase your fluid intake 48 hours prior to treatment and drink at least 8 to 12 cups (64 ounces) of water/decaff beverages per day after treatment. You can still have your cup of coffee or soda but these beverages do not count as part of your 8 to 12 cups that you need to drink daily. No alcohol intake.  Medications Continue taking your normal prescription medication as prescribed.  If you start any new herbal or new supplements please let us know first to make sure it is safe.  Mouth Care Have teeth cleaned professionally before starting treatment. Keep dentures and partial plates clean. Use soft toothbrush and do not use mouthwashes that contain alcohol. Biotene is a good mouthwash that is available at most pharmacies or may be ordered by calling [phone]. Use warm salt water gargles (1 teaspoon salt per 1 quart warm water) before and after meals and at bedtime. Or you may rinse with 2 tablespoons of three-percent hydrogen peroxide mixed in eight ounces of water.  If you are still having problems with your mouth or sores in your mouth please call the clinic. If you need dental work, please let the doctor know before you go for your appointment so that we can coordinate the best possible time for you in regards to your chemo regimen. You need to also let your dentist know that  you are actively taking chemo. We may need to do labs prior to your dental appointment.   Skin Care Always use sunscreen that has not expired and with SPF (Sun Protection Factor) of 50 or higher. Wear hats to protect your head from the sun. Remember to use sunscreen on your hands, ears, face, & feet.  Use good moisturizing lotions such as udder cream, eucerin, or even Vaseline. Some chemotherapies can cause dry skin, color changes in your skin and nails.    . Avoid long, hot showers or baths. . Use gentle, fragrance-free soaps and laundry detergent. . Use moisturizers, preferably creams or ointments rather than lotions because the thicker consistency is better at preventing skin dehydration. Apply the cream or ointment within 15 minutes of showering. Reapply moisturizer at night, and moisturize your hands every time after you wash them.  Hair Loss (if your doctor says your hair will fall out)  . If your doctor says that your hair is likely to fall out, decide before you begin chemo whether you want to wear a wig. You may want to shop before treatment to match your hair color. . Hats, turbans, and scarves can also camouflage hair loss, although some people prefer to leave their heads uncovered. If you go bare-headed outdoors, be sure to use sunscreen on your scalp. . Cut your hair short. It eases the inconvenience of shedding lots of hair, but it also can reduce the emotional impact of watching your hair fall out. . Don't perm or color your hair during chemotherapy. Those chemical treatments are already damaging to hair and can enhance hair loss. Once your chemo treatments are done and  your hair has grown back, it's OK to resume dyeing or perming hair. With chemotherapy, hair loss is almost always temporary. But when it grows back, it may be a different color or texture. In older adults who still had hair color before chemotherapy, the new growth may be completely gray.  Often, new hair is very fine and soft.  Infection Prevention Please wash your hands for at least 30 seconds using warm soapy water. Handwashing is the #1 way to prevent the spread of germs. Stay away from sick people or people who are getting over a cold. If you develop respiratory systems such as green/yellow mucus production or productive cough or persistent cough let us know and we will see if you need an antibiotic. It is a good idea to keep a pair of gloves on when going into grocery stores/[employer] to decrease your risk of coming into contact with germs on the carts, etc. Carry alcohol hand gel with you at all times and use it frequently if out in public. If your temperature reaches 100.5 or higher please call the clinic and let us know.  If it is after hours or on the weekend please go to the ER if your temperature is over 100.5.  Please have your own personal thermometer at home to use.    Sex and bodily fluids If you are going to have sex, a condom must be used to protect the person that isn't taking chemotherapy. Chemo can decrease your libido (sex drive). For a few days after chemotherapy, chemotherapy can be excreted through your bodily fluids.  When using the toilet please close the lid and flush the toilet twice.  Do this for a few day after you have had chemotherapy.     Effects of chemotherapy on your sex life Some changes are simple and won't last  long. They won't affect your sex life permanently. Sometimes you may feel: . too tired . not strong enough to be very active . sick or sore  . not in the mood . anxious or low Your anxiety might not seem related to sex. For example, you may be worried  about the cancer and how your treatment is going. Or you may be worried about money, or about how you family are coping with your illness. These things can cause stress, which can affect your interest in sex. It's important to talk to your partner about how you feel. Remember - the changes to your sex life don't usually last long. There's usually no medical reason to stop having sex during chemo. The drugs won't have any long term physical effects on your performance or enjoyment of sex. Cancer can't be passed on to your partner during sex  Contraception It's important to use reliable contraception during treatment. Avoid getting pregnant while you or your partner are having chemotherapy. This is because the drugs may harm the baby. Sometimes chemotherapy drugs can leave a man or woman infertile.  This means you would not be able to have children in the future. You might want to talk to someone about permanent infertility. It can be very difficult to learn that you may no longer be able to have children. Some people find counselling helpful. There might be ways to preserve your fertility, although this is easier for men than for women. You may want to speak to a fertility expert. You can talk about sperm banking or harvesting your eggs. You can also ask about other fertility options, such as donor eggs. If you have or have had breast cancer, your doctor might advise you not to take the contraceptive pill. This is because the hormones in it might affect the cancer.  It is not known for sure whether or not chemotherapy drugs can be passed on through semen or secretions from the vagina. Because of this some doctors advise people to use a barrier method if you have sex during treatment. This applies to vaginal, anal or oral sex. Generally, doctors advise a barrier method only for the time you are actually having the treatment and for about a week after your treatment. Advice like this can be worrying, but this  does not mean that you have to avoid being intimate with your partner. You can still have close contact with your partner and continue to enjoy sex.  Animals If you have cats or birds we just ask that you not change the litter or change the cage.  Please have someone else do this for you while you are on chemotherapy.   Food Safety During and After Cancer Treatment Food safety is important for people both during and after cancer treatment. Cancer and cancer treatments, such as chemotherapy, radiation therapy, and stem cell/bone marrow transplantation, often weaken the immune system. This makes it harder for your body to protect itself from foodborne illness, also called food poisoning. Foodborne illness is caused by eating food that contains harmful bacteria, parasites, or viruses.  Foods to avoid Some foods have a higher risk of becoming tainted with bacteria. These include: Marland Kitchen Unwashed fresh fruit and vegetables, especially leafy vegetables that can hide dirt and other contaminants . Raw sprouts, such as alfalfa sprouts . Raw or undercooked beef, especially ground beef, or other raw or undercooked meat and poultry . Fatty, fried, or spicy foods immediately before or after treatment.  These can sit  heavy on your stomach and make you feel nauseous. . Raw or undercooked shellfish, such as oysters. . Sushi and sashimi, which often contain raw fish.  . Unpasteurized beverages, such as unpasteurized fruit juices, raw milk, raw yogurt, or cider . Undercooked eggs, such as soft boiled, over easy, and poached; raw, unpasteurized eggs; or foods made with raw egg, such as homemade raw cookie dough and homemade mayonnaise Simple steps for food safety Shop smart. . Do not buy food stored or displayed in an unclean area. . Do not buy bruised or damaged fruits or vegetables. . Do not buy cans that have cracks, dents, or bulges. . Pick up foods that can spoil at the end of your shopping trip and store them  in a cooler on the way home. Prepare and clean up foods carefully. . Rinse all fresh fruits and vegetables under running water, and dry them with a clean towel or paper towel. . Clean the top of cans before opening them. . After preparing food, wash your hands for 20 seconds with hot water and soap. Pay special attention to areas between fingers and under nails. . Clean your utensils and dishes with hot water and soap. Marland Kitchen Disinfect your kitchen and cutting boards using 1 teaspoon of liquid, unscented bleach mixed into 1 quart of water.   Dispose of old food. . Eat canned and packaged food before its expiration date (the "use by" or "best before" date). . Consume refrigerated leftovers within 3 to 4 days. After that time, throw out the food. Even if the food does not smell or look spoiled, it still may be unsafe. Some bacteria, such as Listeria, can grow even on foods stored in the refrigerator if they are kept for too long. Take precautions when eating out. . At restaurants, avoid buffets and salad bars where food sits out for a long time and comes in contact with many people. Food can become contaminated when someone with a virus, often a norovirus, or another "bug" handles it. . Put any leftover food in a "to-go" container yourself, rather than having the server do it. And, refrigerate leftovers as soon as you get home. . Choose restaurants that are clean and that are willing to prepare your food as you order it cooked.    MEDICATIONS: Dexamethasone 4 mg tablets:  Take 2 tablets (8 mg total) by mouth daily. Start the day after chemo for 2 days.  Take with food.    Zofran/Ondansetron '8mg'$  tablet. Take 1 tablet every 8 hours as needed for nausea/vomiting. (#1 nausea med to take, this can constipate)  Compazine/Prochlorperazine '10mg'$  tablet. Take 1 tablet every 6 hours as needed for nausea/vomiting. (#2 nausea med to take, this can make you sleepy)  loperamide (IMODIUM A-D) 2 MG tablet:  Take 2  at diarrhea onset , then 1 every 2hr until 12hrs with no BM. May take 2 every 4hrs at night. If diarrhea recurs repeat.  EMLA cream. Apply a quarter size amount to port site 1 hour prior to chemo. Do not rub in. Cover with plastic wrap.   Over-the-Counter Meds:  Miralax 1 capful in 8 oz of fluid daily. May increase to two times a day if needed. This is a stool softener. If this doesn't work proceed you can add:  Senokot S-start with 1 tablet two times a day and increase to 4 tablets two times a day if needed. (total of 8 tablets in a 24 hour period). This is a stimulant laxative.  Call us if this does not help your bowels move.   Imodium '2mg'$  capsule. Take 2 capsules after the 1st loose stool and then 1 capsule every 2 hours until you go a total of 12 hours without having a loose stool. Call the [facility] if loose stools continue. If diarrhea occurs @ bedtime, take 2 capsules @ bedtime. Then take 2 capsules every 4 hours until morning. Call [facility].     Constipation Sheet *Miralax in 8 oz of fluid daily.  May increase to two times a day if needed.  This is a stool softener.  If this not enough to keep your bowel regular:  You can add:  *Senokot S, start with one tablet twice a day and can increase to 4 tablets twice a day if needed.  This is a stimulant laxative.   Sometimes when you take pain medication you need BOTH a medicine to keep your stool soft and a medicine to help your bowel push it out!  Please call if the above does not work for you.   Do not go more than 2 days without a bowel movement.  It is very important that you do not become constipated.  It will make you feel sick to your stomach (nausea) and can cause abdominal pain and vomiting.     Diarrhea Sheet  If you are having loose stools/diarrhea, please purchase Imodium and begin taking as outlined:  At the first sign of poorly formed or loose stools you should begin taking Imodium(loperamide) 2 mg  capsules.  Take two caplets ('4mg'$ ) followed by one caplet ('2mg'$ ) every 2 hours until you have had no diarrhea for 12 hours.  During the night take two caplets ('4mg'$ ) at bedtime and continue every 4 hours during the night until the morning.  Stop taking Imodium only after there is no sign of diarrhea for 12 hours.    Always call the [facility] if you are having loose stools/diarrhea that you can't get under control.  Loose stools/disrrhea leads to dehydration (loss of water) in your body.  We have other options of trying to get the loose stools/diarrhea to stopped but you must let us know!     Nausea Sheet  Zofran/Ondansetron '8mg'$  tablet. Take 1 tablet every 8 hours as needed for nausea/vomiting. (#1 nausea med to take, this can constipate)  Compazine/Prochlorperazine '10mg'$  tablet. Take 1 tablet every 6 hours as needed for nausea/vomiting. (#2 nausea med to take, this can make you sleepy)  You can take these medications together or separately.  We would first like for you to try the Ondansetron by itself and then take the Prochloperizine if needed. But you are allowed to take both medications at the same time if your nausea is that severe.  If you are having persistent nausea (nausea that does not stop) please take these medications on a staggered schedule so that the nausea medication stays in your body.  Please call the [facility] and let us know the amount of nausea that you are experiencing.  If you begin to vomit, you need to call the [facility] and if it is the weekend and you have vomited more than one time and cant get it to stop-go to the Emergency Room.  Persistent nausea/vomiting can lead to dehydration (loss of fluid in your body) and will make you feel terrible.   Ice chips, sips of clear liquids, foods that are @ room temperature, crackers, and toast tend to be better tolerated.  SYMPTOMS TO REPORT AS SOON AS POSSIBLE AFTER TREATMENT:  FEVER GREATER THAN 100.5 F  CHILLS  WITH OR WITHOUT FEVER  NAUSEA AND VOMITING THAT IS NOT CONTROLLED WITH YOUR NAUSEA MEDICATION  UNUSUAL SHORTNESS OF BREATH  UNUSUAL BRUISING OR BLEEDING  TENDERNESS IN MOUTH AND THROAT WITH OR WITHOUT PRESENCE OF ULCERS  URINARY PROBLEMS  BOWEL PROBLEMS  UNUSUAL RASH    Wear comfortable clothing and clothing appropriate for easy access to any Portacath or PICC line. Let us know if there is anything that we can do to make your therapy better!    What to do if you need assistance after hours or on the weekends: CALL [phone].  HOLD on the line, do not hang up.  You will hear multiple messages but at the end you will be connected with a nurse triage line.  They will contact the doctor if necessary.  Most of the time they will be able to assist you.  Do not call the hospital operator.      I have been informed and understand all of the instructions given to me and have received a copy. I have been instructed to call the clinic [phone] or my family physician as soon as possible for continued medical care, if indicated. I do not have any more questions at this time but understand that I may call the [facility] or the Patient Navigator at [phone] during office hours should I have questions or need assistance in obtaining follow-up care.

## 2016-07-26 ENCOUNTER — Encounter (HOSPITAL_COMMUNITY): Payer: Self-pay | Admitting: Emergency Medicine

## 2016-07-26 NOTE — Progress Notes (Signed)
Chemotherapy teaching pulled together. 

## 2016-07-30 ENCOUNTER — Encounter (HOSPITAL_COMMUNITY): Payer: Medicaid Other | Attending: Oncology

## 2016-07-30 ENCOUNTER — Encounter (HOSPITAL_COMMUNITY): Payer: Medicaid Other

## 2016-07-30 ENCOUNTER — Encounter (HOSPITAL_COMMUNITY): Payer: Self-pay

## 2016-07-30 VITALS — BP 126/68 | HR 83 | Temp 98.2°F | Resp 18 | Wt 212.0 lb

## 2016-07-30 DIAGNOSIS — Z5111 Encounter for antineoplastic chemotherapy: Secondary | ICD-10-CM

## 2016-07-30 DIAGNOSIS — K869 Disease of pancreas, unspecified: Secondary | ICD-10-CM | POA: Insufficient documentation

## 2016-07-30 DIAGNOSIS — C3490 Malignant neoplasm of unspecified part of unspecified bronchus or lung: Secondary | ICD-10-CM

## 2016-07-30 DIAGNOSIS — C3411 Malignant neoplasm of upper lobe, right bronchus or lung: Secondary | ICD-10-CM

## 2016-07-30 DIAGNOSIS — C7972 Secondary malignant neoplasm of left adrenal gland: Secondary | ICD-10-CM

## 2016-07-30 DIAGNOSIS — K8689 Other specified diseases of pancreas: Secondary | ICD-10-CM

## 2016-07-30 LAB — CBC WITH DIFFERENTIAL/PLATELET
BASOS ABS: 0 10*3/uL (ref 0.0–0.1)
BASOS PCT: 0 %
Eosinophils Absolute: 0.1 10*3/uL (ref 0.0–0.7)
Eosinophils Relative: 2 %
HEMATOCRIT: 36.2 % — AB (ref 39.0–52.0)
HEMOGLOBIN: 12.4 g/dL — AB (ref 13.0–17.0)
Lymphocytes Relative: 20 %
Lymphs Abs: 1 10*3/uL (ref 0.7–4.0)
MCH: 30.2 pg (ref 26.0–34.0)
MCHC: 34.3 g/dL (ref 30.0–36.0)
MCV: 88.3 fL (ref 78.0–100.0)
MONO ABS: 0.5 10*3/uL (ref 0.1–1.0)
Monocytes Relative: 10 %
NEUTROS ABS: 3.4 10*3/uL (ref 1.7–7.7)
NEUTROS PCT: 68 %
Platelets: 167 10*3/uL (ref 150–400)
RBC: 4.1 MIL/uL — ABNORMAL LOW (ref 4.22–5.81)
RDW: 14.4 % (ref 11.5–15.5)
WBC: 5 10*3/uL (ref 4.0–10.5)

## 2016-07-30 LAB — COMPREHENSIVE METABOLIC PANEL
ALBUMIN: 3.8 g/dL (ref 3.5–5.0)
ALT: 13 U/L — ABNORMAL LOW (ref 17–63)
ANION GAP: 8 (ref 5–15)
AST: 17 U/L (ref 15–41)
Alkaline Phosphatase: 48 U/L (ref 38–126)
BILIRUBIN TOTAL: 0.4 mg/dL (ref 0.3–1.2)
BUN: 12 mg/dL (ref 6–20)
CHLORIDE: 99 mmol/L — AB (ref 101–111)
CO2: 27 mmol/L (ref 22–32)
Calcium: 8.8 mg/dL — ABNORMAL LOW (ref 8.9–10.3)
Creatinine, Ser: 1.08 mg/dL (ref 0.61–1.24)
GFR calc Af Amer: >60 mL/min (ref >60)
GFR calc non Af Amer: >60 mL/min (ref >60)
GLUCOSE: 99 mg/dL (ref 65–99)
POTASSIUM: 4 mmol/L (ref 3.5–5.1)
SODIUM: 134 mmol/L — AB (ref 135–145)
TOTAL PROTEIN: 7.3 g/dL (ref 6.5–8.1)

## 2016-07-30 MED ORDER — PALONOSETRON HCL INJECTION 0.25 MG/5ML
0.2500 mg | Freq: Once | INTRAVENOUS | Status: AC
  Administered 2016-07-30: 0.25 mg via INTRAVENOUS

## 2016-07-30 MED ORDER — SODIUM CHLORIDE 0.9% FLUSH
10.0000 mL | INTRAVENOUS | Status: DC | PRN
  Administered 2016-07-30: 10 mL
  Filled 2016-07-30: qty 10

## 2016-07-30 MED ORDER — ATROPINE SULFATE 1 MG/ML IJ SOLN
0.5000 mg | Freq: Once | INTRAMUSCULAR | Status: AC | PRN
  Administered 2016-07-30: 0.5 mg via INTRAVENOUS
  Filled 2016-07-30: qty 1

## 2016-07-30 MED ORDER — IRINOTECAN HCL CHEMO INJECTION 100 MG/5ML
60.0000 mg/m2 | Freq: Once | INTRAVENOUS | Status: AC
  Administered 2016-07-30: 120 mg via INTRAVENOUS
  Filled 2016-07-30: qty 1

## 2016-07-30 MED ORDER — PALONOSETRON HCL INJECTION 0.25 MG/5ML
INTRAVENOUS | Status: AC
  Filled 2016-07-30: qty 5

## 2016-07-30 MED ORDER — SODIUM CHLORIDE 0.9 % IV SOLN
648.0000 mg | Freq: Once | INTRAVENOUS | Status: AC
  Administered 2016-07-30: 650 mg via INTRAVENOUS
  Filled 2016-07-30: qty 65

## 2016-07-30 MED ORDER — SODIUM CHLORIDE 0.9 % IV SOLN
Freq: Once | INTRAVENOUS | Status: AC
  Administered 2016-07-30: 11:00:00 via INTRAVENOUS

## 2016-07-30 MED ORDER — DEXAMETHASONE SODIUM PHOSPHATE 10 MG/ML IJ SOLN
INTRAMUSCULAR | Status: AC
  Filled 2016-07-30: qty 1

## 2016-07-30 MED ORDER — HEPARIN SOD (PORK) LOCK FLUSH 100 UNIT/ML IV SOLN: 500.0000 [IU] | Freq: Once | INTRAVENOUS | Status: DC | PRN

## 2016-07-30 MED ORDER — DEXAMETHASONE SODIUM PHOSPHATE 10 MG/ML IJ SOLN
10.0000 mg | Freq: Once | INTRAMUSCULAR | Status: AC
  Administered 2016-07-30: 10 mg via INTRAVENOUS

## 2016-07-30 NOTE — Progress Notes (Signed)
[name] tolerated Irinotecan and Carboplatin infusions well without complaints or incident. Labs reviewed with Dr. Talbert Cage and permit signed prior to administering chemotherapy.Chemo information packet given to pt and purpose and side effects reviewed with pt and his wife during visit today VSS upon discharge. Pt discharged self ambulatory in satisfactory condition accompanied by his wife

## 2016-07-30 NOTE — Patient Instructions (Signed)
[facility] Discharge Instructions for Patients Receiving Chemotherapy   Beginning January 23rd 2017 lab work for the [facility] will be done in the  Main lab at [facility] on 1st floor. If you have a lab appointment with the [facility] please come in thru the  Main Entrance and check in at the main information desk   Today you received the following chemotherapy agents Irinotecan and Carboplatin. Follow-up as scheduled. Call clinic for any questions or concerns  To help prevent nausea and vomiting after your treatment, we encourage you to take your nausea medication   If you develop nausea and vomiting, or diarrhea that is not controlled by your medication, call the clinic.  The clinic phone number is [phone]. Office hours are Monday-Friday 8:30am-5:00pm.  BELOW ARE SYMPTOMS THAT SHOULD BE REPORTED IMMEDIATELY:  *FEVER GREATER THAN 101.0 F  *CHILLS WITH OR WITHOUT FEVER  NAUSEA AND VOMITING THAT IS NOT CONTROLLED WITH YOUR NAUSEA MEDICATION  *UNUSUAL SHORTNESS OF BREATH  *UNUSUAL BRUISING OR BLEEDING  TENDERNESS IN MOUTH AND THROAT WITH OR WITHOUT PRESENCE OF ULCERS  *URINARY PROBLEMS  *BOWEL PROBLEMS  UNUSUAL RASH Items with * indicate a potential emergency and should be followed up as soon as possible. If you have an emergency after office hours please contact your primary care physician or go to the nearest emergency department.  Please call the clinic during office hours if you have any questions or concerns.   You may also contact the Patient Navigator at [phone] should you have any questions or need assistance in obtaining follow up care.      Resources For Cancer Patients and their Caregivers ? American Cancer Society: Can assist with transportation, wigs, general needs, runs Look Good Feel Better.        ([phone] ? Cancer Care: Provides financial assistance, online support groups, medication/co-pay assistance.   1-800-813-HOPE (312)414-3824) ? [place] Assists [place] cancer patients and their families through emotional , educational and financial support.  724-192-5111 ? [place] DSS Where to apply for food stamps, Medicaid and utility assistance. 3065597954 ? [employer]ATS: Transportation to medical appointments. 563-418-1882 ? Social Security Administration: May apply for disability if have a Stage IV cancer. 754-412-5900 515-193-0223 ? [employer], Disability and Transit Services: Assists with nutrition, care and transit needs. [phone]

## 2016-07-31 ENCOUNTER — Telehealth (HOSPITAL_COMMUNITY): Payer: Self-pay

## 2016-07-31 NOTE — Telephone Encounter (Signed)
See telephone encounter note.

## 2016-08-06 ENCOUNTER — Encounter (HOSPITAL_COMMUNITY): Payer: Self-pay

## 2016-08-06 ENCOUNTER — Encounter (HOSPITAL_COMMUNITY): Payer: Medicaid Other

## 2016-08-06 ENCOUNTER — Encounter (HOSPITAL_BASED_OUTPATIENT_CLINIC_OR_DEPARTMENT_OTHER): Payer: Medicaid Other | Admitting: Oncology

## 2016-08-06 ENCOUNTER — Encounter (HOSPITAL_BASED_OUTPATIENT_CLINIC_OR_DEPARTMENT_OTHER): Payer: Medicaid Other

## 2016-08-06 VITALS — BP 115/67 | HR 95 | Temp 98.3°F | Resp 18 | Wt 212.4 lb

## 2016-08-06 DIAGNOSIS — Z720 Tobacco use: Secondary | ICD-10-CM | POA: Diagnosis not present

## 2016-08-06 DIAGNOSIS — R05 Cough: Secondary | ICD-10-CM | POA: Diagnosis not present

## 2016-08-06 DIAGNOSIS — K8689 Other specified diseases of pancreas: Secondary | ICD-10-CM

## 2016-08-06 DIAGNOSIS — D6481 Anemia due to antineoplastic chemotherapy: Secondary | ICD-10-CM

## 2016-08-06 DIAGNOSIS — K869 Disease of pancreas, unspecified: Secondary | ICD-10-CM | POA: Diagnosis not present

## 2016-08-06 DIAGNOSIS — Z5111 Encounter for antineoplastic chemotherapy: Secondary | ICD-10-CM | POA: Diagnosis not present

## 2016-08-06 DIAGNOSIS — E279 Disorder of adrenal gland, unspecified: Secondary | ICD-10-CM | POA: Diagnosis not present

## 2016-08-06 DIAGNOSIS — C3411 Malignant neoplasm of upper lobe, right bronchus or lung: Secondary | ICD-10-CM

## 2016-08-06 DIAGNOSIS — C3490 Malignant neoplasm of unspecified part of unspecified bronchus or lung: Secondary | ICD-10-CM | POA: Diagnosis not present

## 2016-08-06 DIAGNOSIS — C7972 Secondary malignant neoplasm of left adrenal gland: Secondary | ICD-10-CM | POA: Diagnosis not present

## 2016-08-06 LAB — CBC WITH DIFFERENTIAL/PLATELET
BASOS PCT: 0 %
Basophils Absolute: 0 10*3/uL (ref 0.0–0.1)
Eosinophils Absolute: 0.1 10*3/uL (ref 0.0–0.7)
Eosinophils Relative: 2 %
HEMATOCRIT: 37.1 % — AB (ref 39.0–52.0)
HEMOGLOBIN: 12.8 g/dL — AB (ref 13.0–17.0)
LYMPHS ABS: 0.8 10*3/uL (ref 0.7–4.0)
LYMPHS PCT: 16 %
MCH: 30.3 pg (ref 26.0–34.0)
MCHC: 34.5 g/dL (ref 30.0–36.0)
MCV: 87.9 fL (ref 78.0–100.0)
MONOS PCT: 13 %
Monocytes Absolute: 0.6 10*3/uL (ref 0.1–1.0)
NEUTROS ABS: 3.4 10*3/uL (ref 1.7–7.7)
NEUTROS PCT: 69 %
Platelets: 161 10*3/uL (ref 150–400)
RBC: 4.22 MIL/uL (ref 4.22–5.81)
RDW: 14.3 % (ref 11.5–15.5)
WBC: 4.8 10*3/uL (ref 4.0–10.5)

## 2016-08-06 LAB — COMPREHENSIVE METABOLIC PANEL
ALBUMIN: 3.8 g/dL (ref 3.5–5.0)
ALT: 14 U/L — ABNORMAL LOW (ref 17–63)
AST: 17 U/L (ref 15–41)
Alkaline Phosphatase: 61 U/L (ref 38–126)
Anion gap: 7 (ref 5–15)
BILIRUBIN TOTAL: 0.5 mg/dL (ref 0.3–1.2)
BUN: 16 mg/dL (ref 6–20)
CHLORIDE: 95 mmol/L — AB (ref 101–111)
CO2: 30 mmol/L (ref 22–32)
CREATININE: 1.05 mg/dL (ref 0.61–1.24)
Calcium: 8.7 mg/dL — ABNORMAL LOW (ref 8.9–10.3)
GFR calc Af Amer: >60 mL/min (ref >60)
GLUCOSE: 85 mg/dL (ref 65–99)
Potassium: 3.8 mmol/L (ref 3.5–5.1)
Sodium: 132 mmol/L — ABNORMAL LOW (ref 135–145)
Total Protein: 7.2 g/dL (ref 6.5–8.1)

## 2016-08-06 MED ORDER — IRINOTECAN HCL CHEMO INJECTION 100 MG/5ML
60.0000 mg/m2 | Freq: Once | INTRAVENOUS | Status: AC
  Administered 2016-08-06: 120 mg via INTRAVENOUS
  Filled 2016-08-06: qty 5

## 2016-08-06 MED ORDER — SODIUM CHLORIDE 0.9 % IV SOLN
Freq: Once | INTRAVENOUS | Status: AC
  Administered 2016-08-06: 11:00:00 via INTRAVENOUS

## 2016-08-06 MED ORDER — PALONOSETRON HCL INJECTION 0.25 MG/5ML
INTRAVENOUS | Status: AC
  Filled 2016-08-06: qty 5

## 2016-08-06 MED ORDER — DEXAMETHASONE SODIUM PHOSPHATE 10 MG/ML IJ SOLN
INTRAMUSCULAR | Status: AC
  Filled 2016-08-06: qty 1

## 2016-08-06 MED ORDER — DEXAMETHASONE SODIUM PHOSPHATE 10 MG/ML IJ SOLN
10.0000 mg | Freq: Once | INTRAMUSCULAR | Status: AC
Start: 2016-08-06 — End: 2016-08-06
  Administered 2016-08-06: 10 mg via INTRAVENOUS

## 2016-08-06 MED ORDER — PALONOSETRON HCL INJECTION 0.25 MG/5ML
0.2500 mg | Freq: Once | INTRAVENOUS | Status: AC
  Administered 2016-08-06: 0.25 mg via INTRAVENOUS

## 2016-08-06 MED ORDER — ATROPINE SULFATE 1 MG/ML IJ SOLN
INTRAMUSCULAR | Status: AC
  Filled 2016-08-06: qty 1

## 2016-08-06 MED ORDER — ATROPINE SULFATE 1 MG/ML IJ SOLN
0.5000 mg | Freq: Once | INTRAMUSCULAR | Status: AC | PRN
  Administered 2016-08-06: 0.5 mg via INTRAVENOUS

## 2016-08-06 NOTE — Patient Instructions (Signed)
[place] at [facility] Discharge Instructions  RECOMMENDATIONS MADE BY THE CONSULTANT AND ANY TEST RESULTS WILL BE SENT TO YOUR REFERRING PHYSICIAN.  You were seen today by Dr. Twana First Follow up 5/7 with cycle 2 with the doctor See Amy up front for appointments   Thank you for choosing [place] at [facility] to provide your oncology and hematology care.  To afford each patient quality time with our provider, please arrive at least 15 minutes before your scheduled appointment time.    If you have a lab appointment with the [facility] please come in thru the  Main Entrance and check in at the main information desk  You need to re-schedule your appointment should you arrive 10 or more minutes late.  We strive to give you quality time with our providers, and arriving late affects you and other patients whose appointments are after yours.  Also, if you no show three or more times for appointments you may be dismissed from the clinic at the providers discretion.     Again, thank you for choosing [facility].  Our hope is that these requests will decrease the amount of time that you wait before being seen by our physicians.       _____________________________________________________________  Should you have questions after your visit to [facility], please contact our office at [phone] between the hours of 8:30 a.m. and 4:30 p.m.  Voicemails left after 4:30 p.m. will not be returned until the following business day.  For prescription refill requests, have your pharmacy contact our office.       Resources For Cancer Patients and their Caregivers ? American Cancer Society: Can assist with transportation, wigs, general needs, runs Look Good Feel Better.        (563)654-6709 ? Cancer Care: Provides financial assistance, online support groups, medication/co-pay assistance.  1-800-813-HOPE 313-505-2412) ? [place] Assists [place] cancer patients and their families through emotional , educational and financial support.  915-842-4135 ? [place] DSS Where to apply for food stamps, Medicaid and utility assistance. [phone] ? RCATS: Transportation to medical appointments. 248-047-7018 ? Social Security Administration: May apply for disability if have a Stage IV cancer. 331-534-8742 763-158-1293 ? [employer], Disability and Transit Services: Assists with nutrition, care and transit needs. [place] Support Programs: '@10RELATIVEDAYS'$ @ > Cancer Support Group  2nd Tuesday of the month 1pm-2pm, Journey Room  > Creative Journey  3rd Tuesday of the month 1130am-1pm, Journey Room  > Look Good Feel Better  1st Wednesday of the month 10am-12 noon, Journey Room (Call [place] to register [phone])

## 2016-08-06 NOTE — Patient Instructions (Signed)
[facility] Discharge Instructions for Patients Receiving Chemotherapy   Beginning January 23rd 2017 lab work for the [facility] will be done in the  Main lab at [facility] on 1st floor. If you have a lab appointment with the [facility] please come in thru the  Main Entrance and check in at the main information desk   Today you received the following chemotherapy agents:  Irinotecan  If you develop nausea and vomiting, or diarrhea that is not controlled by your medication, call the clinic.  The clinic phone number is [phone]. Office hours are Monday-Friday 8:30am-5:00pm.  BELOW ARE SYMPTOMS THAT SHOULD BE REPORTED IMMEDIATELY:  *FEVER GREATER THAN 101.0 F  *CHILLS WITH OR WITHOUT FEVER  NAUSEA AND VOMITING THAT IS NOT CONTROLLED WITH YOUR NAUSEA MEDICATION  *UNUSUAL SHORTNESS OF BREATH  *UNUSUAL BRUISING OR BLEEDING  TENDERNESS IN MOUTH AND THROAT WITH OR WITHOUT PRESENCE OF ULCERS  *URINARY PROBLEMS  *BOWEL PROBLEMS  UNUSUAL RASH Items with * indicate a potential emergency and should be followed up as soon as possible. If you have an emergency after office hours please contact your primary care physician or go to the nearest emergency department.  Please call the clinic during office hours if you have any questions or concerns.   You may also contact the Patient Navigator at [phone] should you have any questions or need assistance in obtaining follow up care.      Resources For Cancer Patients and their Caregivers ? American Cancer Society: Can assist with transportation, wigs, general needs, runs Look Good Feel Better.        ([phone] ? Cancer Care: Provides financial assistance, online support groups, medication/co-pay assistance.  1-800-813-HOPE (564)081-9897) ? [place] Assists [place] cancer patients and their families through emotional , educational and financial support.   435-762-3926 ? [place] DSS Where to apply for food stamps, Medicaid and utility assistance. (714)822-8735 ? RCATS: Transportation to medical appointments. ([phone] 632-5997 ? Social Security Administration: May apply for disability if have a Stage IV cancer. 3083497679 724-642-3557 ? [employer], Disability and Transit Services: Assists with nutrition, care and transit needs. [phone]

## 2016-08-06 NOTE — Progress Notes (Signed)
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       01/18/2015 Imaging    CT chest- 8.7 x 8.7 cm R hilar mass encasing the R pulm artery and occluding the R upper lobe bronchus. Sm R pleural effusion; malignant or reactive. 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 34 mm x 26 mm mass in body of pancreas      01/21/2015 Imaging    MRI brain- No acute intracranial findings. No signs of metastatic disease. Minor white matter disease, stable and nonspecific.      01/26/2015 Pathology Results    Lung, biopsy, right upper lobe - SMALL CELL CARCINOMA.      01/26/2015 Pathology Results    Diagnosis FINE NEEDLE ASPIRATION: ENDOSCOPIC SPECIMEN A, EBUS LEVEL 7 NODE (SPECIMEN 1 OF 3 COLLECTED 01/26/2015) MALIGNANT CELLS PRESENT, CONSISTENT WITH SMALL CELL CARCINOMA.      01/26/2015 Pathology Results    WANG NEEDLE ASPIRATION, SPECIMEN B LEVEL 7 NODE (SPECIMEN 2 OF 3 COLLECTED  01/26/2015) MALIGNANT CELLS PRESENT, CONSISTENT WITH SMALL CELL CARCINOMA.      01/26/2015 Pathology Results    Diagnosis BRONCHIAL BRUSHING SPECIMEN C, RIGHT UPPER LOBE (SPECIMEN 3 OF 3 COLLECTED 01/26/2015) ATYPICAL CELLS PRESENT.      01/27/2015 PET scan    Large hypermetabolic right hilar mass with mediastinal invasion and infrahilar extension, maximum standard uptake value 13.0.  Pancreatic tail mass, approximately 4.5 cm in diameter. Abdominal aortic aneurysm, 5.8 cm in diameter.      02/10/2015 Pathology Results    Diagnosis FINE NEEDLE ASPIRATION, ENDOSCOPIC, PANCREAS TAIL (SPECIMEN 1 OF 1 COLLECTED 02/10/15): MALIGNANT CELLS CONSISTENT WITH METASTATIC SMALL CELL CARCINOMA.      02/14/2015 - 03/06/2015 Chemotherapy    Cisplatin/Etoposide      03/06/2015 Treatment Plan Change    Change systemic therapy to Carboplatin based      03/07/2015 - 05/10/2015 Chemotherapy    Carboplatin/Etoposide.      04/13/2015 Imaging    resolution of pancreatic metastases, decrease in size of R perihilar mss and RUL mass. Interval response to therapy      05/10/2015 - 05/31/2015 Chemotherapy    Change to carboplatin/irinotecan for cycle #5 and #6 secondary to significant skin toxicity presumably from etoposide      07/12/2015 -  Radiation Therapy    25 Gy to whole brain and 30 Gy to R hilar region by Dr. MoLisbeth Renshaw     09/13/2015 Imaging    CT C/A/P unchanged from  2/28. persistent prominence of peribronchovascular intersitium throughout R lung, in perihilar region, no definitive findings to suggest progression of disease on exam      11/03/2015 Imaging    MRI brain 3 mm area of restricted diffusion R posterior temporal lobe without abnormal enhancement favor acute infarct, mild dural thickening enhancement over R hemisphere now present previously      11/15/2015 Imaging    CT C/A/P Interval development of 1.6 x 1.4 cm L adrenal nodule suspicious for metastatic lesion. No other sites of new  metastatic disease are noted elsewhere in C/A/P. Evolving postradiation changes in R lung      12/01/2015 - 12/14/2015 Radiation Therapy    Radiation therapy to L adrenal nodule      01/18/2016 Imaging    CT C/A/P 1. Stable appearance of the chest. Specifically, there are post XRT findings in the right lung with perihilar and hilar airway thickening, some upper lobe airway plugging, and scattered densities which merit observation but which are likely therapy rib related. 2. Centrilobular emphysema  3. Coronary, aortic arch, and branch vessel atherosclerotic vascular disease 4. On prior PET-CT from 01/27/2015 there was a pancreatic body mass. Prior biopsy from 02/10/2015 showed this to be consistent with metastatic small cell carcinoma. This mass is not visible on today's CT scan, and has presumably resolved with treatment. There is a transition from a somewhat atrophic pancreatic tail to a normal caliber pancreatic body occurring in the vicinity of the prior mass. 5. Reduced size of a small left adrenal mass, currently 1.2 by 1.7 cm 6. Infrarenal abdominal aortic aneurysm up to 5.8 cm diameter, with patent aorta bi-iliac stent graft in place and no evidence of endoleak 7. Lumbar spondylosis and degenerative disc disease with mild impingement at L3-4 and L4-5      02/02/2016 Imaging    No evidence of metastatic disease to the brain.Resolution of small right hemispheric fluid collection. No leptomeningeal enhancement identified on today's study. This finding may have been due to a small subdural hematoma previously which has resolved.Negative for acute infarct. Small area of restricted diffusion in the right posterior temporal lobe on the prior study likely was an area of acute infarct which has resolved.      04/13/2016 Imaging    CT CAP- 1. Relatively similar appearance of areas of right-sided radiation change. No well-defined residual or recurrent disease identified. 2. No thoracic  adenopathy. 3. No evidence of metastatic disease in the abdomen or pelvis.      07/10/2016 Imaging    MRI brain- 1. No evidence of intracranial metastatic disease within limitations of mild motion artifact. 2. Unchanged subcentimeter lesion in the clivus. Small osseous metastasis not excluded.      07/20/2016 Imaging    CT CAP- 9.5 x 4.6 cm central right upper lobe mass extending to the perihilar region, new, compatible with lung cancer recurrence. Associated subpleural nodularity in the right upper lobe, suspicious for metastatic disease.  Associated mild mediastinal lymphadenopathy, compatible with nodal metastases.  Multifocal hepatic lesions in both lobes, new, compatible with hepatic metastases, with index lesions as above.  Additional ancillary findings as above.      07/20/2016 Relapse/Recurrence    Last Treatment Date: 12/14/15 XRT; 06/10/15 Chemotherapy Recent Lab Values: Recent Lab Values:  N/A       HISTORY OF PRESENTING ILLNESS:  [name] 56 y.o. male is here for further up of extensive stage SCLC. He is accompanied by family today.  He has completed cycle 1  day 1 of carboplatin/irinotecan on 07/30/16, he tolerated it very well with no side effects. He is here for day 8.  On review of systems, the patient denies nausea, vomiting, or diarrhea. He denies chest pain or abdominal pain. He denies neuropathy. He denies any concerns regarding his recent chemotherapy. He reports he has not been sleeping well recently, which he attributes to his steroid. He reports a constant productive cough with white sputum.   MEDICAL HISTORY:  Past Medical History:  Diagnosis Date  . AAA (abdominal aortic aneurysm) ([place])   . Arthritis   . COPD (chronic obstructive pulmonary disease) ([place])   . Coronary artery disease 2002   Lesion in LAD s/p angioplasty; s/p CABG x 4 Sept 2013  . Essential hypertension, benign   . Hyperlipidemia   . Myocardial infarct ([place]) 1999, 2002  .  Pancreatitis   . Small cell lung cancer ([place])    Stage IIIB with extension the pancreas    SURGICAL HISTORY: Past Surgical History:  Procedure Laterality Date  . ABDOMINAL AORTIC ENDOVASCULAR STENT GRAFT N/A 08/11/2015   Procedure: ABDOMINAL AORTIC ENDOVASCULAR STENT GRAFT;  Surgeon: Serafina Mitchell, MD;  Location[facility];  Service: Vascular;  Laterality: N/A;  . [place], 2002  . CORONARY ARTERY BYPASS GRAFT  01/02/2012   Procedure: CORONARY ARTERY BYPASS GRAFTING (CABG);  Surgeon: [name], MD;  Location: [place];  Service: Open Heart Surgery;  Laterality: N/A;  times three using left internal mammary artery and right endoscopically harvested saphenous vein  . EUS N/A 02/10/2015   Procedure: UPPER ENDOSCOPIC ULTRASOUND (EUS) LINEAR;  Surgeon: [name], MD;  Location: [facility] ENDOSCOPY;  Service: Endoscopy;  Laterality: N/A;  . KNEE SURGERY Left    ACL repair  . LEFT HEART CATHETERIZATION WITH CORONARY ANGIOGRAM N/A 01/01/2012   Procedure: LEFT HEART CATHETERIZATION WITH CORONARY ANGIOGRAM;  Surgeon: Hillary Bow, MD;  Location: [facility] CATH LAB;  Service: Cardiovascular;  Laterality: N/A;  . LUNG BIOPSY N/A 01/26/2015   Procedure: LUNG BIOPSY;  Surgeon: [name], MD;  Location: [place];  Service: Thoracic;  Laterality: N/A;  . LYMPH NODE BIOPSY N/A 01/26/2015   Procedure: LYMPH NODE BIOPSY;  Surgeon: [name], MD;  Location: [place];  Service: Thoracic;  Laterality: N/A;  . MULTIPLE EXTRACTIONS WITH ALVEOLOPLASTY  02/21/2012   Procedure: MULTIPLE EXTRACION WITH ALVEOLOPLASTY;  Surgeon: Lenn Cal, DDS;  Location: [facility] ORS;  Service: Oral Surgery;  Laterality: N/A;  Extaction of tooth #'s 4,5,6,7,10,11,12,13,14,15,19,20,21,22,23,24,25,   . PORTACATH PLACEMENT Left 02/01/2015   Procedure: ATTEMPTED INSERTION OF PORT-A-CATH;  Surgeon: [name], MD;  Location: [place];  Service: Thoracic;  Laterality: Left;  . TEE WITHOUT CARDIOVERSION   01/02/2012   Procedure: TRANSESOPHAGEAL ECHOCARDIOGRAM (TEE);  Surgeon: [name], MD;  Location: [place];  Service: Open Heart Surgery;  Laterality: N/A;  . VIDEO BRONCHOSCOPY WITH ENDOBRONCHIAL ULTRASOUND N/A 01/26/2015   Procedure: VIDEO BRONCHOSCOPY WITH ENDOBRONCHIAL ULTRASOUND;  Surgeon: [name], MD;  Location: [place];  Service: Thoracic;  Laterality: N/A;    SOCIAL HISTORY: Social History   Social History  . Marital status: Married    Spouse name: N/A  . Number of children: 5  . Years of education: N/A   Occupational History  . Works in [employer]  . Smoking status: Current Every Day Smoker    Packs/day: 1.00    Years: 35.00    Types:  Cigarettes    Start date: 06/30/1976  . Smokeless tobacco: Never Used  . Alcohol use No     Comment: Maybe a 12 pack/mth if that - very rare since 2013  . Drug use: No  . Sexual activity: Yes   Other Topics Concern  . Not on file   Social History Narrative  . No narrative on file   They've been married 17 years, with five kids.  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 Heart disease Father   . Diabetes Father   . Hyperlipidemia Father   . Hypertension Father   . Hyperlipidemia Mother   . Hypertension Mother   . Stroke Mother   . Diabetes Paternal Grandmother   . Cancer Neg Hx    indicated that his mother is alive. He indicated that his father is alive. He indicated that his sister is alive. He indicated that his brother is alive. He indicated that his maternal grandmother is deceased. He indicated that his maternal grandfather is deceased. He  indicated that his paternal grandmother is deceased. He indicated that his paternal grandfather is deceased. He indicated that the status of his neg hx is unknown.    His father's somewhere in [place]; his mother lives here in [place] and works at [employer] 76 at the end of this month She's got high blood pressure, high cholesterol, no strokes that they know of; is on blood thinners Has an aneurism at the base of the aorta where it goes into your stomach He knows his father's had high blood pressure and diabetes; Parents have been divorced since he was 3-4 Has half brothers / half sisters   ALLERGIES:  is allergic to lisinopril and tea.  MEDICATIONS:  Current Outpatient Prescriptions  Medication Sig Dispense Refill  . amLODipine (NORVASC) 5 MG tablet TAKE ONE (1) TABLET BY MOUTH EVERY DAY 90 tablet 3  . aspirin EC 81 MG tablet Take 81 mg by mouth daily.    . benzonatate (TESSALON PERLES) 100 MG capsule Take 1 capsule (100 mg total) by mouth 3 (three) times daily as needed for cough. 30 capsule 0  . CARBOPLATIN IV Inject into the vein. Day 1 every 28 days    . carvedilol (COREG) 6.25 MG tablet TAKE ONE TABLET BY MOUTH TWICE A DAY WITH A MEAL 180 tablet 1  . dexamethasone (DECADRON) 4 MG tablet Take 2 tablets (8 mg total) by mouth daily. Start the day after chemo for 2 days. 8 tablet 5  . guaiFENesin (MUCINEX) 600 MG 12 hr tablet Take by mouth 2 (two) times daily.    Marland Kitchen guaiFENesin-codeine 100-10 MG/5ML syrup Take 10 mLs by mouth every 6 (six) hours as needed for cough. 140 mL 0  . IRINOTECAN HCL IV Inject into the vein. Day 1, day 8, day 15 every 28 days    . loperamide (IMODIUM A-D) 2 MG tablet Take 2 at diarrhea onset , then 1 every 2hr until 12hrs with no BM. May take 2 every 4hrs at night. If diarrhea recurs repeat. 100 tablet 1  . ondansetron (ZOFRAN) 8  MG tablet Take 1 tablet (8 mg total) by mouth 2 (two) times daily as needed for refractory nausea / vomiting. Start on day 3  after chemotherapy. 30 tablet 1  . pravastatin (PRAVACHOL) 40 MG tablet TAKE ONE (1) TABLET BY MOUTH EVERY DAY 90 tablet 2  . predniSONE (DELTASONE) 20 MG tablet Take 1 tablet (20 mg total) by mouth daily with breakfast. 7 tablet 0  . prochlorperazine (COMPAZINE) 10 MG tablet Take 1 tablet (10 mg total) by mouth every 6 (six) hours as needed (NAUSEA). 30 tablet 1   No current facility-administered medications for this visit.     Review of Systems  Constitutional: Negative.   HENT: Negative.   Eyes: Negative.   Respiratory: Positive for cough and sputum production (white in color).   Cardiovascular: Negative.  Negative for chest pain.  Gastrointestinal: Negative.  Negative for abdominal pain, diarrhea, nausea and vomiting.  Genitourinary: Negative.   Skin: Negative.   Neurological: Negative.  Negative for tingling.  Endo/Heme/Allergies: Negative.   Psychiatric/Behavioral: The patient has insomnia.   All other systems reviewed and are negative. 14 point ROS was done and is otherwise as detailed above or in HPI   PHYSICAL EXAMINATION: ECOG PERFORMANCE STATUS: 0 - Asymptomatic   There were no vitals taken for this visit.  Vitals with BMI 08/06/2016  Height   Weight 212 lbs 6 oz  BMI   Systolic 875  Diastolic 66  Pulse 84  Respirations 18   Physical Exam  Constitutional: He is oriented to person, place, and time.  HENT:  Head: Normocephalic and atraumatic.  Nose: Nose normal.  Dentures on top and bottom.   Eyes: Conjunctivae and EOM are normal. Pupils are equal, round, and reactive to light.  Neck: Normal range of motion. Neck supple.  Cardiovascular: Normal rate, regular rhythm and normal heart sounds.  Exam reveals no gallop and no friction rub.   No murmur heard. Pulmonary/Chest: Effort normal. No respiratory distress. He has no wheezes. He has no rales. He exhibits no tenderness.  Occasional rhonchi  Abdominal: Soft. Bowel sounds are normal. He exhibits no distension  and no mass. There is no tenderness. There is no rebound and no guarding.  Musculoskeletal: Normal range of motion.  Neurological: He is alert and oriented to person, place, and time. He has normal reflexes.  Skin: Skin is warm and dry.  Psychiatric: Judgment normal.  Nursing note and vitals reviewed.   LABORATORY DATA:  I have reviewed the data as listed.  CBC    Component Value Date/Time   WBC 4.8 08/06/2016 0910   RBC 4.22 08/06/2016 0910   HGB 12.8 (L) 08/06/2016 0910   HCT 37.1 (L) 08/06/2016 0910   PLT 161 08/06/2016 0910   MCV 87.9 08/06/2016 0910   MCH 30.3 08/06/2016 0910   MCHC 34.5 08/06/2016 0910   RDW 14.3 08/06/2016 0910   LYMPHSABS 0.8 08/06/2016 0910   MONOABS 0.6 08/06/2016 0910   EOSABS 0.1 08/06/2016 0910   BASOSABS 0.0 08/06/2016 0910   CMP     Component Value Date/Time   NA 132 (L) 08/06/2016 0910   K 3.8 08/06/2016 0910   CL 95 (L) 08/06/2016 0910   CO2 30 08/06/2016 0910   GLUCOSE 85 08/06/2016 0910   BUN 16 08/06/2016 0910   CREATININE 1.05 08/06/2016 0910   CREATININE 0.99 01/27/2015 1450   CALCIUM 8.7 (L) 08/06/2016 0910   PROT 7.2 08/06/2016 0910   ALBUMIN 3.8 08/06/2016 0910   AST 17  08/06/2016 0910   ALT 14 (L) 08/06/2016 0910   ALKPHOS 61 08/06/2016 0910   BILITOT 0.5 08/06/2016 0910   GFRNONAA >60 08/06/2016 0910   GFRNONAA 87 01/27/2015 1450   GFRAA >60 08/06/2016 0910   GFRAA >89 01/27/2015 1450    RADIOGRAPHIC STUDIES: I have personally reviewed the radiological images as listed and agreed with the findings in the report. Study Result   CLINICAL DATA:  Restaging of extensive stage small cell lung cancer with pancreatic mass. Status post chemotherapy. COPD. Endovascular stent graft. Right-sided primary.  EXAM: CT CHEST, ABDOMEN, AND PELVIS WITH CONTRAST  TECHNIQUE: Multidetector CT imaging of the chest, abdomen and pelvis was performed following the standard protocol during bolus administration of intravenous  contrast.  CONTRAST:  127m ISOVUE-300 IOPAMIDOL (ISOVUE-300) INJECTION 61%  COMPARISON:  01/18/2016  FINDINGS: CT CHEST FINDINGS  Cardiovascular: Prior median sternotomy. Aortic and branch vessel atherosclerosis. Tortuous thoracic aorta. Normal heart size, without pericardial effusion. Pulmonary artery enlargement, 3.4 cm outflow tract. No central pulmonary embolism, on this non-dedicated study.  Mediastinum/Nodes: No supraclavicular adenopathy. No mediastinal adenopathy. Right perihilar soft tissue thickening is unchanged, without well-defined adenopathy.  Lungs/Pleura: No pleural fluid. Right upper lobe bronchial compression anteriorly is similar. Moderate bronchial wall thickening. Moderate centrilobular and paraseptal emphysema.  Interstitial thickening within the peribronchovascular inferior right upper lobe is similar, including image 57/series 4. There is also subpleural reticulation in the more peripheral posterior right upper lobe and superior segment right lower lobe. No well-defined residual or recurrent mass. Anterior right upper lobe subpleural linear opacities are likely radiation induced and new. Thickening of the right major fissure is felt to be similar, including on image 63/series 4.  Musculoskeletal: No acute osseous abnormality.  CT ABDOMEN PELVIS FINDINGS  Hepatobiliary: Normal liver. Normal gallbladder, without biliary ductal dilatation.  Pancreas: No residual or recurrent pancreatic mass. Atrophy involving the tail is again identified.  Spleen: Normal in size, without focal abnormality.  Adrenals/Urinary Tract: Normal adrenal glands. Normal kidneys, without hydronephrosis. Normal urinary bladder.  This  Stomach/Bowel: Normal stomach, without wall thickening. Normal colon, appendix, and terminal ileum. Normal small bowel.  Vascular/Lymphatic: Aortic and branch vessel atherosclerosis. Status post endograft repair. Native sac  measures on the order of 5.0 by 4.6 cm versus 5.5 x 5.8 on the prior. No endoleak. Patent iliac volumes. There is persistent dilatation of proximal internal iliac arteries including at 1.5 cm on the right, similar. No abdominopelvic adenopathy.  Reproductive: Normal prostate.  Other: No significant free fluid. No evidence of omental or peritoneal disease.  Musculoskeletal: No acute osseous abnormality. L3-4 and L4-5 disc bulges.  IMPRESSION: 1. Relatively similar appearance of areas of right-sided radiation change. No well-defined residual or recurrent disease identified. 2. No thoracic adenopathy. 3. No evidence of metastatic disease in the abdomen or pelvis. 4.  Aortic atherosclerosis. 5. Status post aortic endograft repair, with decreased size native sac. Persistent dilatation of the proximal internal iliac arteries bilaterally. 6. Pulmonary artery enlargement suggests pulmonary arterial hypertension.   Electronically Signed   By: KAbigail MiyamotoM.D.   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 Large hypermetabolic right hilar mass with mediastinal invasion and infrahilar extension  Hyponatremia c/w SIADH  EUS with Dr. Ardis Hughs and pancreatic biopsy on 02/10/2015 c/w SCLC Anemia secondary to chemotherapy CT C/A/P 04/13/2015 with interval response to therapy. Presumed drug reaction to etoposide Treatment related anemia CR to first line therapy R  calf pain ABNL MRI brain 11/03/2015 L adrenal nodule Abnormal MRI of the brain  PLAN: - Tolerating carbo/irinotecan very well without any side effects. Continue with chemotherapy Day 8 Cycle 1 today. - RTC on 08/27/16 for Day 1 Cycle 2 and follow up. - The patient may try OTC medications for cough. He will contact the clinic immediately if the cough worsens for if sputum changes in color.     All questions were answered. The patient knows to call the clinic with any problems, questions or concerns.  This document serves as a record of services personally performed by Twana First, MD. It was created on her behalf by Maryla Morrow, a trained medical scribe. The creation of this record is based on the scribe's personal observations and the provider's statements to them. This document has been checked and approved by the attending provider.  I have reviewed the above documentation for accuracy and completeness, and I agree with the above.  This note was electronically signed.    Twana First, MD 08/06/16

## 2016-08-06 NOTE — Progress Notes (Signed)
Tolerated infusion w/o adverse reaction.  Alert, in no distress.  VSS.  Discharged ambulatory.  

## 2016-08-07 ENCOUNTER — Telehealth (HOSPITAL_COMMUNITY): Payer: Self-pay

## 2016-08-07 NOTE — Telephone Encounter (Signed)
Patient called and said he needed a "prescription for his steroids". Reviewed chart. He has refills for dexamethasone. Checked with pharmacy also. Pharmacy to refill for patient. Notified patient and also instructed how patient should take prescription. He is to call if any questions. Patient verbalized understanding.

## 2016-08-13 ENCOUNTER — Encounter (HOSPITAL_BASED_OUTPATIENT_CLINIC_OR_DEPARTMENT_OTHER): Payer: Medicaid Other

## 2016-08-13 ENCOUNTER — Encounter (HOSPITAL_COMMUNITY): Payer: Medicaid Other

## 2016-08-13 ENCOUNTER — Encounter (HOSPITAL_COMMUNITY): Payer: Self-pay

## 2016-08-13 VITALS — BP 110/64 | HR 81 | Temp 98.2°F | Resp 18 | Wt 212.8 lb

## 2016-08-13 DIAGNOSIS — C3490 Malignant neoplasm of unspecified part of unspecified bronchus or lung: Secondary | ICD-10-CM

## 2016-08-13 DIAGNOSIS — C7972 Secondary malignant neoplasm of left adrenal gland: Secondary | ICD-10-CM

## 2016-08-13 DIAGNOSIS — Z5111 Encounter for antineoplastic chemotherapy: Secondary | ICD-10-CM

## 2016-08-13 DIAGNOSIS — K8689 Other specified diseases of pancreas: Secondary | ICD-10-CM

## 2016-08-13 DIAGNOSIS — C3411 Malignant neoplasm of upper lobe, right bronchus or lung: Secondary | ICD-10-CM

## 2016-08-13 LAB — CBC WITH DIFFERENTIAL/PLATELET
BASOS PCT: 0 %
Basophils Absolute: 0 10*3/uL (ref 0.0–0.1)
EOS ABS: 0 10*3/uL (ref 0.0–0.7)
EOS PCT: 1 %
HCT: 34.8 % — ABNORMAL LOW (ref 39.0–52.0)
HEMOGLOBIN: 11.9 g/dL — AB (ref 13.0–17.0)
LYMPHS ABS: 1 10*3/uL (ref 0.7–4.0)
Lymphocytes Relative: 20 %
MCH: 30.3 pg (ref 26.0–34.0)
MCHC: 34.2 g/dL (ref 30.0–36.0)
MCV: 88.5 fL (ref 78.0–100.0)
MONOS PCT: 12 %
Monocytes Absolute: 0.6 10*3/uL (ref 0.1–1.0)
NEUTROS PCT: 67 %
Neutro Abs: 3.2 10*3/uL (ref 1.7–7.7)
PLATELETS: 119 10*3/uL — AB (ref 150–400)
RBC: 3.93 MIL/uL — AB (ref 4.22–5.81)
RDW: 15 % (ref 11.5–15.5)
WBC: 4.7 10*3/uL (ref 4.0–10.5)

## 2016-08-13 LAB — COMPREHENSIVE METABOLIC PANEL
ALBUMIN: 3.6 g/dL (ref 3.5–5.0)
ALT: 13 U/L — ABNORMAL LOW (ref 17–63)
ANION GAP: 7 (ref 5–15)
AST: 15 U/L (ref 15–41)
Alkaline Phosphatase: 54 U/L (ref 38–126)
BUN: 17 mg/dL (ref 6–20)
CO2: 30 mmol/L (ref 22–32)
Calcium: 8.6 mg/dL — ABNORMAL LOW (ref 8.9–10.3)
Chloride: 96 mmol/L — ABNORMAL LOW (ref 101–111)
Creatinine, Ser: 1.21 mg/dL (ref 0.61–1.24)
GFR calc non Af Amer: >60 mL/min (ref >60)
GLUCOSE: 104 mg/dL — AB (ref 65–99)
Potassium: 3.8 mmol/L (ref 3.5–5.1)
SODIUM: 133 mmol/L — AB (ref 135–145)
Total Bilirubin: 0.5 mg/dL (ref 0.3–1.2)
Total Protein: 7 g/dL (ref 6.5–8.1)

## 2016-08-13 MED ORDER — PALONOSETRON HCL INJECTION 0.25 MG/5ML
0.2500 mg | Freq: Once | INTRAVENOUS | Status: AC
  Administered 2016-08-13: 0.25 mg via INTRAVENOUS
  Filled 2016-08-13: qty 5

## 2016-08-13 MED ORDER — ATROPINE SULFATE 1 MG/ML IJ SOLN
0.5000 mg | Freq: Once | INTRAMUSCULAR | Status: AC | PRN
  Administered 2016-08-13: 0.5 mg via INTRAVENOUS
  Filled 2016-08-13: qty 1

## 2016-08-13 MED ORDER — DEXAMETHASONE SODIUM PHOSPHATE 10 MG/ML IJ SOLN
10.0000 mg | Freq: Once | INTRAMUSCULAR | Status: AC
  Administered 2016-08-13: 10 mg via INTRAVENOUS
  Filled 2016-08-13: qty 1

## 2016-08-13 MED ORDER — SODIUM CHLORIDE 0.9 % IV SOLN
Freq: Once | INTRAVENOUS | Status: AC
  Administered 2016-08-13: 11:00:00 via INTRAVENOUS

## 2016-08-13 MED ORDER — IRINOTECAN HCL CHEMO INJECTION 100 MG/5ML
60.0000 mg/m2 | Freq: Once | INTRAVENOUS | Status: AC
  Administered 2016-08-13: 120 mg via INTRAVENOUS
  Filled 2016-08-13: qty 5

## 2016-08-13 NOTE — Progress Notes (Signed)
Chemotherapy given today, patient tolerated it well. Vitals stable and discharged home from clinic ambulatory. Follow up as scheduled.

## 2016-08-13 NOTE — Patient Instructions (Signed)
[facility] Discharge Instructions for Patients Receiving Chemotherapy   Beginning January 23rd 2017 lab work for the Cancer Center will be done in the  Main lab at [place] on 1st floor. If you have a lab appointment with the Cancer Center please come in thru the  Main Entrance and check in at the main information desk   Today you received the following chemotherapy agents   To help prevent nausea and vomiting after your treatment, we encourage you to take your nausea medication     If you develop nausea and vomiting, or diarrhea that is not controlled by your medication, call the clinic.  The clinic phone number is [phone]. Office hours are Monday-Friday 8:30am-5:00pm.  BELOW ARE SYMPTOMS THAT SHOULD BE REPORTED IMMEDIATELY:  *FEVER GREATER THAN 101.0 F  *CHILLS WITH OR WITHOUT FEVER  NAUSEA AND VOMITING THAT IS NOT CONTROLLED WITH YOUR NAUSEA MEDICATION  *UNUSUAL SHORTNESS OF BREATH  *UNUSUAL BRUISING OR BLEEDING  TENDERNESS IN MOUTH AND THROAT WITH OR WITHOUT PRESENCE OF ULCERS  *URINARY PROBLEMS  *BOWEL PROBLEMS  UNUSUAL RASH Items with * indicate a potential emergency and should be followed up as soon as possible. If you have an emergency after office hours please contact your primary care physician or go to the nearest emergency department.  Please call the clinic during office hours if you have any questions or concerns.   You may also contact the Patient Navigator at [phone] should you have any questions or need assistance in obtaining follow up care.      Resources For Cancer Patients and their Caregivers ? American Cancer Society: Can assist with transportation, wigs, general needs, runs Look Good Feel Better.        1-888-227-6333 ? Cancer Care: Provides financial assistance, online support groups, medication/co-pay assistance.  1-800-813-HOPE (4673) ? [facility] Assists [place] cancer  patients and their families through emotional , educational and financial support.  336-427-4357 ? [place] DSS Where to apply for food stamps, Medicaid and utility assistance. 336-342-1394 ? [facility]: Transportation to medical appointments. 336-347-2287 ? Social Security Administration: May apply for disability if have a Stage IV cancer. 336-342-7796 1-800-772-1213 ? [place] Aging, Disability and Transit Services: Assists with nutrition, care and transit needs. 336-349-2343         

## 2016-08-23 ENCOUNTER — Ambulatory Visit (HOSPITAL_COMMUNITY)
Admission: RE | Admit: 2016-08-23 | Discharge: 2016-08-23 | Disposition: A | Discharge status: Home / Self Care | Payer: Self-pay | Source: Ambulatory Visit | Attending: Adult Health | Admitting: Adult Health

## 2016-08-23 ENCOUNTER — Telehealth (HOSPITAL_COMMUNITY): Payer: Self-pay

## 2016-08-23 ENCOUNTER — Other Ambulatory Visit (HOSPITAL_COMMUNITY): Payer: Self-pay | Admitting: Adult Health

## 2016-08-23 ENCOUNTER — Emergency Department (HOSPITAL_COMMUNITY): Payer: Self-pay

## 2016-08-23 ENCOUNTER — Emergency Department (HOSPITAL_COMMUNITY)
Admission: EM | Admit: 2016-08-23 | Discharge: 2016-08-23 | Disposition: A | Discharge status: Home / Self Care | Payer: Self-pay | Attending: Emergency Medicine | Admitting: Emergency Medicine

## 2016-08-23 ENCOUNTER — Encounter (HOSPITAL_COMMUNITY): Payer: Self-pay | Admitting: Emergency Medicine

## 2016-08-23 DIAGNOSIS — F17210 Nicotine dependence, cigarettes, uncomplicated: Secondary | ICD-10-CM | POA: Insufficient documentation

## 2016-08-23 DIAGNOSIS — M79661 Pain in right lower leg: Secondary | ICD-10-CM

## 2016-08-23 DIAGNOSIS — M79604 Pain in right leg: Secondary | ICD-10-CM | POA: Insufficient documentation

## 2016-08-23 DIAGNOSIS — I724 Aneurysm of artery of lower extremity: Secondary | ICD-10-CM

## 2016-08-23 DIAGNOSIS — Z79899 Other long term (current) drug therapy: Secondary | ICD-10-CM | POA: Insufficient documentation

## 2016-08-23 DIAGNOSIS — C3491 Malignant neoplasm of unspecified part of right bronchus or lung: Secondary | ICD-10-CM

## 2016-08-23 DIAGNOSIS — I2510 Atherosclerotic heart disease of native coronary artery without angina pectoris: Secondary | ICD-10-CM | POA: Insufficient documentation

## 2016-08-23 DIAGNOSIS — Z7982 Long term (current) use of aspirin: Secondary | ICD-10-CM | POA: Insufficient documentation

## 2016-08-23 DIAGNOSIS — I10 Essential (primary) hypertension: Secondary | ICD-10-CM | POA: Insufficient documentation

## 2016-08-23 DIAGNOSIS — J449 Chronic obstructive pulmonary disease, unspecified: Secondary | ICD-10-CM | POA: Insufficient documentation

## 2016-08-23 DIAGNOSIS — C3490 Malignant neoplasm of unspecified part of unspecified bronchus or lung: Secondary | ICD-10-CM

## 2016-08-23 DIAGNOSIS — Z85118 Personal history of other malignant neoplasm of bronchus and lung: Secondary | ICD-10-CM | POA: Insufficient documentation

## 2016-08-23 LAB — I-STAT CHEM 8, ED
BUN: 13 mg/dL (ref 6–20)
CREATININE: 1.3 mg/dL — AB (ref 0.61–1.24)
Calcium, Ion: 1.1 mmol/L — ABNORMAL LOW (ref 1.15–1.40)
Chloride: 98 mmol/L — ABNORMAL LOW (ref 101–111)
GLUCOSE: 116 mg/dL — AB (ref 65–99)
HCT: 28 % — ABNORMAL LOW (ref 39.0–52.0)
HEMOGLOBIN: 9.5 g/dL — AB (ref 13.0–17.0)
Potassium: 3.8 mmol/L (ref 3.5–5.1)
Sodium: 135 mmol/L (ref 135–145)
TCO2: 28 mmol/L (ref 0–100)

## 2016-08-23 MED ORDER — IOPAMIDOL (ISOVUE-370) INJECTION 76%
125.0000 mL | Freq: Once | INTRAVENOUS | Status: AC | PRN
  Administered 2016-08-23: 125 mL via INTRAVENOUS

## 2016-08-23 NOTE — ED Triage Notes (Signed)
PT reports right lower leg pain worsening today and had an outpatient ultrasound done and his Cancer doctor did a referral to Dr. Trula Slade (vascular) office and that office told pt to come to ED for eval.  Ultrasound impression: No evidence of DVT with the right lower extremity. 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 Arterial Doppler or CT angiogram is recommended for further evaluation.

## 2016-08-23 NOTE — Telephone Encounter (Signed)
After NP reviewed results, she ordered patient to be referred to vascular surgeon. I called patient and explained what was found and that it could be causing his pain. Explained that we would make referral and someone will call him with appt. Message sent to scheduling.

## 2016-08-23 NOTE — Discharge Instructions (Signed)
Follow-up with her vascular surgeon tomorrow. If you get worse tonight, to [facility]

## 2016-08-23 NOTE — Addendum Note (Signed)
Addended by: Jaynie Collins R on: 08/23/2016 11:52 AM   Modules accepted: Orders

## 2016-08-23 NOTE — ED Provider Notes (Signed)
[facility] DEPT Provider Note   CSN: [id number] Arrival date & time: 08/23/16  1443     History   Chief Complaint Chief Complaint  Patient presents with  . Abnormal Lab    HPI [name] is a 56 y.o. male.  Patient states that he's been having pain from his knee distally for over a year but today started having some pain in his upper part of his right leg. Patient had a ultrasound done today that did not show a DVT but showed a popliteal aneurysm. He was sent here for further studies.    Leg Pain   This is a new problem. The current episode started 12 to 24 hours ago. The problem occurs constantly. The problem has been resolved. The pain is present in the right upper leg. The quality of the pain is described as dull. The pain is at a severity of 3/10. The pain is moderate. Pertinent negatives include no numbness. He has tried nothing for the symptoms.    Past Medical History:  Diagnosis Date  . AAA (abdominal aortic aneurysm) ([place])   . Arthritis   . COPD (chronic obstructive pulmonary disease) ([place])   . Coronary artery disease 2002   Lesion in LAD s/p angioplasty; s/p CABG x 4 Sept 2013  . Essential hypertension, benign   . Hyperlipidemia   . Myocardial infarct ([place]) 1999, 2002  . Pancreatitis   . 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 AAA (abdominal aortic aneurysm) without rupture ([place]) 08/11/2015  . Small cell lung cancer ([place]) 02/07/2015  . Mass of pancreas   . Lung mass-right 01/19/2015  . Pancreatic mass 01/19/2015  . COPD (chronic obstructive pulmonary disease) ([place]) 11/27/2010  . Tobacco abuse 11/22/2010  . Mixed hyperlipidemia 11/10/2010  . HYPERTENSION, BENIGN 01/10/2009  . CAD, NATIVE VESSEL 01/10/2009    Past Surgical History:  Procedure Laterality Date  . ABDOMINAL AORTIC ENDOVASCULAR STENT GRAFT N/A 08/11/2015   Procedure: ABDOMINAL AORTIC ENDOVASCULAR  STENT GRAFT;  Surgeon: Serafina Mitchell, MD;  Location: [facility];  Service: Vascular;  Laterality: N/A;  . [place], 2002  . CORONARY ARTERY BYPASS GRAFT  01/02/2012   Procedure: CORONARY ARTERY BYPASS GRAFTING (CABG);  Surgeon: [name], MD;  Location: [facility];  Service: Open Heart Surgery;  Laterality: N/A;  times three using left internal mammary artery and right endoscopically harvested saphenous vein  . EUS N/A 02/10/2015   Procedure: UPPER ENDOSCOPIC ULTRASOUND (EUS) LINEAR;  Surgeon: [name], MD;  Location: [facility] ENDOSCOPY;  Service: Endoscopy;  Laterality: N/A;  . KNEE SURGERY Left    ACL repair  . LEFT HEART CATHETERIZATION WITH CORONARY ANGIOGRAM N/A 01/01/2012   Procedure: LEFT HEART CATHETERIZATION WITH CORONARY ANGIOGRAM;  Surgeon: Hillary Bow, MD;  Location: [facility] CATH LAB;  Service: Cardiovascular;  Laterality: N/A;  . LUNG BIOPSY N/A 01/26/2015   Procedure: LUNG BIOPSY;  Surgeon: [name], MD;  Location: [facility];  Service: Thoracic;  Laterality: N/A;  . LYMPH NODE BIOPSY N/A 01/26/2015   Procedure: LYMPH NODE BIOPSY;  Surgeon: [name], MD;  Location: [facility];  Service: Thoracic;  Laterality: N/A;  . MULTIPLE EXTRACTIONS WITH ALVEOLOPLASTY  02/21/2012   Procedure: MULTIPLE EXTRACION WITH ALVEOLOPLASTY;  Surgeon: Lenn Cal, DDS;  Location: [facility] ORS;  Service: Oral Surgery;  Laterality: N/A;  Extaction of tooth #'s  4,5,6,7,10,11,12,13,14,15,19,20,21,22,23,24,25,   . PORTACATH PLACEMENT Left 02/01/2015   Procedure: ATTEMPTED INSERTION OF PORT-A-CATH;  Surgeon: [name], MD;  Location: [facility];  Service: Thoracic;  Laterality: Left;  . TEE WITHOUT CARDIOVERSION  01/02/2012   Procedure: TRANSESOPHAGEAL ECHOCARDIOGRAM (TEE);  Surgeon: [name], MD;  Location: [facility];  Service: Open Heart Surgery;  Laterality: N/A;  . VIDEO BRONCHOSCOPY WITH ENDOBRONCHIAL ULTRASOUND N/A 01/26/2015   Procedure: VIDEO BRONCHOSCOPY WITH  ENDOBRONCHIAL ULTRASOUND;  Surgeon: [name], MD;  Location: [facility] OR;  Service: Thoracic;  Laterality: N/A;       Home Medications    Prior to Admission medications   Medication Sig Start Date End Date Taking? Authorizing Provider  amLODipine (NORVASC) 5 MG tablet TAKE ONE (1) TABLET BY MOUTH EVERY DAY 11/28/15  Yes [name], MD  aspirin EC 81 MG tablet Take 81 mg by mouth daily.   Yes Historical Provider, MD  CARBOPLATIN IV Inject into the vein. Day 1 every 28 days   Yes Historical Provider, MD  carvedilol (COREG) 6.25 MG tablet TAKE ONE TABLET BY MOUTH TWICE A DAY WITH A MEAL 05/28/16  Yes [name], MD  dexamethasone (DECADRON) 4 MG tablet Take 2 tablets (8 mg total) by mouth daily. Start the day after chemo for 2 days. 07/25/16  Yes Manon Hilding Kefalas, PA-C  IRINOTECAN HCL IV Inject into the vein. Day 1, day 8, day 15 every 28 days   Yes Historical Provider, MD  loperamide (IMODIUM A-D) 2 MG tablet Take 2 at diarrhea onset , then 1 every 2hr until 12hrs with no BM. May take 2 every 4hrs at night. If diarrhea recurs repeat. 07/25/16  Yes Manon Hilding Kefalas, PA-C  ondansetron (ZOFRAN) 8 MG tablet Take 1 tablet (8 mg total) by mouth 2 (two) times daily as needed for refractory nausea / vomiting. Start on day 3 after chemotherapy. 07/25/16  Yes Manon Hilding Kefalas, PA-C  pravastatin (PRAVACHOL) 40 MG tablet TAKE ONE (1) TABLET BY MOUTH EVERY DAY 12/06/15  Yes [name], MD  prochlorperazine (COMPAZINE) 10 MG tablet Take 1 tablet (10 mg total) by mouth every 6 (six) hours as needed (NAUSEA). 07/25/16  Yes Manon Hilding Kefalas, PA-C  benzonatate (TESSALON PERLES) 100 MG capsule Take 1 capsule (100 mg total) by mouth 3 (three) times daily as needed for cough. Patient not taking: Reported on 08/23/2016 07/10/16   Brunetta Genera, MD  guaiFENesin-codeine 100-10 MG/5ML syrup Take 10 mLs by mouth every 6 (six) hours as needed for cough. Patient not taking: Reported on 08/23/2016 05/14/16    Alycia Rossetti, MD  predniSONE (DELTASONE) 20 MG tablet Take 1 tablet (20 mg total) by mouth daily with breakfast. Patient not taking: Reported on 08/23/2016 07/10/16   Brunetta Genera, MD    Family History Family History  Problem Relation Age of Onset  . Heart disease Father   . Diabetes Father   . Hyperlipidemia Father   . Hypertension Father   . Hyperlipidemia Mother   . Hypertension Mother   . Stroke Mother   . Diabetes Paternal Grandmother   . Cancer Neg Hx     Social History Social History  Substance Use Topics  . Smoking status: Current Every Day Smoker    Packs/day: 2.00    Years: 35.00    Types: Cigarettes    Start date: 06/30/1976  . Smokeless tobacco: Never Used  . Alcohol use No     Comment: Maybe a  12 pack/mth if that - very rare since 2013     Allergies   Lisinopril and Tea   Review of Systems Review of Systems  Constitutional: Negative for appetite change and fatigue.  HENT: Negative for congestion, ear discharge and sinus pressure.   Eyes: Negative for discharge.  Respiratory: Negative for cough.   Cardiovascular: Negative for chest pain.  Gastrointestinal: Negative for abdominal pain and diarrhea.  Genitourinary: Negative for frequency and hematuria.  Musculoskeletal: Negative for back pain.       Pain right lower leg  Skin: Negative for rash.  Neurological: Negative for seizures, numbness and headaches.  Psychiatric/Behavioral: Negative for hallucinations.     Physical Exam Updated Vital Signs BP 105/72   Pulse 80   Temp 98.1 F (36.7 C) (Oral)   Resp 15   Ht '5\' 10"'$  (1.778 m)   Wt 212 lb (96.2 kg)   SpO2 92%   BMI 30.42 kg/m   Physical Exam  Constitutional: He is oriented to person, place, and time. He appears well-developed.  HENT:  Head: Normocephalic.  Eyes: Conjunctivae and EOM are normal. No scleral icterus.  Neck: Neck supple. No thyromegaly present.  Cardiovascular: Normal rate and regular rhythm.  Exam reveals no  gallop and no friction rub.   No murmur heard. Pulmonary/Chest: No stridor. He has no wheezes. He has no rales. He exhibits no tenderness.  Abdominal: He exhibits no distension. There is no tenderness. There is no rebound.  Musculoskeletal: Normal range of motion. He exhibits no edema.  Patient has a good femoral pulse on the right side no dorsalis pedis pulse felt. Both legs are warm. Patient has good cap refill in his right and left feet. Neuro exam normal  Lymphadenopathy:    He has no cervical adenopathy.  Neurological: He is oriented to person, place, and time. He exhibits normal muscle tone. Coordination normal.  Skin: No rash noted. No erythema.  Psychiatric: He has a normal mood and affect. His behavior is normal.     ED Treatments / Results  Labs (all labs ordered are listed, but only abnormal results are displayed) Labs Reviewed  I-STAT CHEM 8, ED - Abnormal; Notable for the following:       Result Value   Chloride 98 (*)    Creatinine, Ser 1.30 (*)    Glucose, Bld 116 (*)    Calcium, Ion 1.10 (*)    Hemoglobin 9.5 (*)    HCT 28.0 (*)    All other components within normal limits    EKG  EKG Interpretation None       Radiology US Venous Img Lower Unilateral Right  Result Date: 08/23/2016 CLINICAL DATA:  Acute right lower extremity pain. EXAM: Right LOWER EXTREMITY VENOUS DOPPLER ULTRASOUND TECHNIQUE: Gray-scale sonography with graded compression, as well as color Doppler and duplex ultrasound were performed to evaluate the lower extremity deep venous systems from the level of the common femoral vein and including the common femoral, femoral, profunda femoral, popliteal and calf veins including the posterior tibial, peroneal and gastrocnemius veins when visible. The superficial great saphenous vein was also interrogated. Spectral Doppler was utilized to evaluate flow at rest and with distal augmentation maneuvers in the common femoral, femoral and popliteal veins.  COMPARISON:  Ultrasound of Sep 14, 2015. FINDINGS: Contralateral Common Femoral Vein: Respiratory phasicity is normal and symmetric with the symptomatic side. No evidence of thrombus. Normal compressibility. Common Femoral Vein: No evidence of thrombus. Normal compressibility, respiratory phasicity and response to augmentation.  Saphenofemoral Junction: No evidence of thrombus. Normal compressibility and flow on color Doppler imaging. Profunda Femoral Vein: No evidence of thrombus. Normal compressibility and flow on color Doppler imaging. Femoral Vein: No evidence of thrombus. Normal compressibility, respiratory phasicity and response to augmentation. Popliteal Vein: No evidence of thrombus. Normal compressibility, respiratory phasicity and response to augmentation. Calf Veins: No evidence of thrombus. Normal compressibility and flow on color Doppler imaging. Superficial Great Saphenous Vein: No evidence of thrombus. Normal compressibility and flow on color Doppler imaging. Venous Reflux:  None. Other Findings: Right popliteal artery aneurysm measuring 1.9 cm is noted. The popliteal artery may be thrombosed. IMPRESSION: No evidence of DVT with the right lower extremity. 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 Arterial Doppler or CT angiogram is recommended for further evaluation. These results will be called to the ordering clinician or representative by the Radiologist Assistant, and communication documented in the PACS or zVision Dashboard. Electronically Signed   By: Marijo Conception, M.D.   On: 08/23/2016 11:34    Procedures Procedures (including critical care time)  Medications Ordered in ED Medications  iopamidol (ISOVUE-370) 76 % injection 125 mL (125 mLs Intravenous Contrast Given 08/23/16 1617)     Initial Impression / Assessment and Plan / ED Course  I have reviewed the triage vital signs and the nursing notes.  Pertinent labs & imaging results  that were available during my care of the patient were reviewed by me and considered in my medical decision making (see chart for details).     CT angiogram shows the popliteal aneurysm. That is thrombosed. The vascular surgeon reviewed the study and stated the patient should come down to the hospital in [facility] and be evaluated by the vascular surgeon. The patient refused to go to [facility].  The surgeon stated if he did not come tonight then he would be seen in the office tomorrow. Patient will be followed up tomorrow  Final Clinical Impressions(s) / ED Diagnoses   Final diagnoses:  Leg pain, diffuse, right    New Prescriptions New Prescriptions   No medications on file     Milton Ferguson, MD 08/23/16 2022

## 2016-08-23 NOTE — Telephone Encounter (Signed)
Patient called stating his right calf pain that he has had over a year is radiating up to his upper leg and hip. He states his leg is not red, swollen, or hot. It only hurts when he walks and he rates it as an 8/10. He describes the pain as dull and "hurts like hell". He has taken ibuprofen for the pain with little relief. Reviewed with NP who ordered stat Doppler of right leg to rule out clot. Depending on results of doppler she will decide what to do next. Notified patient who verbalized understanding. Notified scheduling who will make appt and call patient.

## 2016-08-23 NOTE — Telephone Encounter (Signed)
Scheduling received call from Vascular Office about scheduling patients appt. Dr. Ruta Hinds, who reviewed the patients scans from this am, is recommending patient go to the ER due to the pain patient is having in his leg and the findings from this am's doppler. Called patient and explained above to him. He expressed thanks and verbalized understanding.

## 2016-08-24 ENCOUNTER — Telehealth: Payer: Self-pay | Admitting: Surgery

## 2016-08-24 ENCOUNTER — Encounter: Payer: Self-pay | Admitting: Surgery

## 2016-08-24 NOTE — Telephone Encounter (Signed)
Per Kay-triage- patient is to be added on to Dr. Stephens Shire schedule for 5/7 @ 11:15. Pt is aware.

## 2016-08-27 ENCOUNTER — Encounter (HOSPITAL_COMMUNITY): Payer: Self-pay

## 2016-08-27 ENCOUNTER — Encounter: Payer: Self-pay | Admitting: Surgery

## 2016-08-27 ENCOUNTER — Encounter (HOSPITAL_COMMUNITY): Payer: Medicaid Other | Attending: Oncology

## 2016-08-27 ENCOUNTER — Encounter (HOSPITAL_COMMUNITY): Payer: Medicaid Other

## 2016-08-27 ENCOUNTER — Encounter (HOSPITAL_BASED_OUTPATIENT_CLINIC_OR_DEPARTMENT_OTHER): Payer: Medicaid Other | Admitting: Oncology

## 2016-08-27 ENCOUNTER — Ambulatory Visit (HOSPITAL_COMMUNITY): Payer: Medicaid Other | Admitting: Oncology

## 2016-08-27 ENCOUNTER — Ambulatory Visit (INDEPENDENT_AMBULATORY_CARE_PROVIDER_SITE_OTHER): Payer: Self-pay | Admitting: Surgery

## 2016-08-27 VITALS — BP 110/73 | HR 98 | Temp 97.2°F | Resp 18 | Ht 69.0 in | Wt 208.0 lb

## 2016-08-27 DIAGNOSIS — C3411 Malignant neoplasm of upper lobe, right bronchus or lung: Secondary | ICD-10-CM

## 2016-08-27 DIAGNOSIS — C3491 Malignant neoplasm of unspecified part of right bronchus or lung: Secondary | ICD-10-CM | POA: Insufficient documentation

## 2016-08-27 DIAGNOSIS — E279 Disorder of adrenal gland, unspecified: Secondary | ICD-10-CM

## 2016-08-27 DIAGNOSIS — Z720 Tobacco use: Secondary | ICD-10-CM

## 2016-08-27 DIAGNOSIS — C7972 Secondary malignant neoplasm of left adrenal gland: Secondary | ICD-10-CM

## 2016-08-27 DIAGNOSIS — I739 Peripheral vascular disease, unspecified: Secondary | ICD-10-CM

## 2016-08-27 DIAGNOSIS — C3490 Malignant neoplasm of unspecified part of unspecified bronchus or lung: Secondary | ICD-10-CM

## 2016-08-27 DIAGNOSIS — K8689 Other specified diseases of pancreas: Secondary | ICD-10-CM

## 2016-08-27 DIAGNOSIS — D6481 Anemia due to antineoplastic chemotherapy: Secondary | ICD-10-CM

## 2016-08-27 DIAGNOSIS — I714 Abdominal aortic aneurysm, without rupture, unspecified: Secondary | ICD-10-CM

## 2016-08-27 DIAGNOSIS — K869 Disease of pancreas, unspecified: Secondary | ICD-10-CM

## 2016-08-27 LAB — COMPREHENSIVE METABOLIC PANEL
ALBUMIN: 3.5 g/dL (ref 3.5–5.0)
ALK PHOS: 53 U/L (ref 38–126)
ALT: 13 U/L — AB (ref 17–63)
AST: 17 U/L (ref 15–41)
Anion gap: 7 (ref 5–15)
BILIRUBIN TOTAL: 0.5 mg/dL (ref 0.3–1.2)
BUN: 15 mg/dL (ref 6–20)
CALCIUM: 8.7 mg/dL — AB (ref 8.9–10.3)
CO2: 27 mmol/L (ref 22–32)
CREATININE: 1.11 mg/dL (ref 0.61–1.24)
Chloride: 97 mmol/L — ABNORMAL LOW (ref 101–111)
GFR calc Af Amer: >60 mL/min (ref >60)
GLUCOSE: 154 mg/dL — AB (ref 65–99)
Potassium: 3.7 mmol/L (ref 3.5–5.1)
Sodium: 131 mmol/L — ABNORMAL LOW (ref 135–145)
TOTAL PROTEIN: 7.3 g/dL (ref 6.5–8.1)

## 2016-08-27 LAB — CBC WITH DIFFERENTIAL/PLATELET
BASOS ABS: 0 10*3/uL (ref 0.0–0.1)
BASOS PCT: 0 %
Eosinophils Absolute: 0.1 10*3/uL (ref 0.0–0.7)
Eosinophils Relative: 2 %
HEMATOCRIT: 33.3 % — AB (ref 39.0–52.0)
HEMOGLOBIN: 11.2 g/dL — AB (ref 13.0–17.0)
LYMPHS ABS: 0.7 10*3/uL (ref 0.7–4.0)
LYMPHS PCT: 16 %
MCH: 29.7 pg (ref 26.0–34.0)
MCHC: 33.6 g/dL (ref 30.0–36.0)
MCV: 88.3 fL (ref 78.0–100.0)
MONOS PCT: 15 %
Monocytes Absolute: 0.7 10*3/uL (ref 0.1–1.0)
NEUTROS ABS: 2.9 10*3/uL (ref 1.7–7.7)
NEUTROS PCT: 67 %
Platelets: 149 10*3/uL — ABNORMAL LOW (ref 150–400)
RBC: 3.77 MIL/uL — ABNORMAL LOW (ref 4.22–5.81)
RDW: 16.4 % — ABNORMAL HIGH (ref 11.5–15.5)
WBC: 4.4 10*3/uL (ref 4.0–10.5)

## 2016-08-27 NOTE — Progress Notes (Signed)
Vascular and Vein Specialist of [facility]  Patient name: [name] MRN: [id number] DOB: [birth year] Sex: male   REASON FOR VISIT:    Leg pain  HISOTRY OF PRESENT ILLNESS:    [name] a 56 y.o.malewho is back today for his first postoperative visit. 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 His postoperative course was uncomplicated. His surgery was initially delayed because he is undergoing treatment for lung cancer.    His lung cancer initially was successfully treated however it has recently recurred and he has been getting chemotherapy.  Last week he developed severe pain in his right leg.  He went to the emergency department and got a CT scan which shows a thrombosed right popliteal aneurysm as well as a interval occlusion of the right limb of the stent graft.  He states that he can sometimes tolerate his leg pain and it goes away and he can ambulate without difficulty.  Other times it is very bothersome.  He does not have any open wounds or active infection.   PAST MEDICAL HISTORY:   Past Medical History:  Diagnosis Date  . AAA (abdominal aortic aneurysm) ([place])   . Arthritis   . COPD (chronic obstructive pulmonary disease) ([place])   . Coronary artery disease 2002   Lesion in LAD s/p angioplasty; s/p CABG x 4 Sept 2013  . Essential hypertension, benign   . Hyperlipidemia   . Myocardial infarct ([place]) 1999, 2002  . Pancreatitis   . 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 Heart disease Father   . Diabetes Father   . Hyperlipidemia Father   . Hypertension Father   . Hyperlipidemia Mother   . Hypertension Mother   . Stroke Mother   . AAA (abdominal aortic aneurysm) Mother   . Diabetes Paternal Grandmother   . Cancer Neg Hx     SOCIAL HISTORY:   Social History    Substance Use Topics  . Smoking status: Current Every Day Smoker    Packs/day: 2.00    Years: 35.00    Types: Cigarettes    Start date: 06/30/1976  . Smokeless tobacco: Never Used  . Alcohol use No     Comment: Maybe a 12 pack/mth if that - very rare since 2013     ALLERGIES:   Allergies  Allergen Reactions  . Lisinopril Cough  . Tea Hives     CURRENT MEDICATIONS:   Current Outpatient Prescriptions  Medication Sig Dispense Refill  . amLODipine (NORVASC) 5 MG tablet TAKE ONE (1) TABLET BY MOUTH EVERY DAY 90 tablet 3  . aspirin EC 81 MG tablet Take 81 mg by mouth daily.    Marland Kitchen CARBOPLATIN IV Inject into the vein. Day 1 every 28 days    . carvedilol (COREG) 6.25 MG tablet TAKE ONE TABLET BY MOUTH TWICE A DAY WITH A MEAL 180 tablet 1  . dexamethasone (DECADRON) 4 MG tablet Take 2 tablets (8 mg total) by mouth daily. Start the day after chemo for 2 days. 8 tablet 5  . IRINOTECAN HCL IV Inject into the vein. Day 1, day 8, day 15 every 28 days    . pravastatin (PRAVACHOL) 40 MG tablet TAKE ONE (1) TABLET BY MOUTH EVERY DAY 90 tablet 2  . ondansetron (ZOFRAN) 8 MG tablet Take 1 tablet (8 mg total)  by mouth 2 (two) times daily as needed for refractory nausea / vomiting. Start on day 3 after chemotherapy. (Patient not taking: Reported on 08/27/2016) 30 tablet 1  . prochlorperazine (COMPAZINE) 10 MG tablet Take 1 tablet (10 mg total) by mouth every 6 (six) hours as needed (NAUSEA). (Patient not taking: Reported on 08/27/2016) 30 tablet 1   No current facility-administered medications for this visit.     REVIEW OF SYSTEMS:   '[X]'$  denotes positive finding, '[ ]'$  denotes negative finding Cardiac  Comments:  Chest pain or chest pressure:    Shortness of breath upon exertion:    Short of breath when lying flat:    Irregular heart rhythm:        Vascular    Pain in calf, thigh, or hip brought on by ambulation: x   Pain in feet at night that wakes you up from your sleep:  x   Blood clot in  your veins:    Leg swelling:         Pulmonary    Oxygen at home:    Productive cough:     Wheezing:         Neurologic    Sudden weakness in arms or legs:     Sudden numbness in arms or legs:     Sudden onset of difficulty speaking or slurred speech:    Temporary loss of vision in one eye:     Problems with dizziness:         Gastrointestinal    Blood in stool:     Vomited blood:         Genitourinary    Burning when urinating:     Blood in urine:        Psychiatric    Major depression:         Hematologic    Bleeding problems:    Problems with blood clotting too easily:        Skin    Rashes or ulcers:        Constitutional    Fever or chills:      PHYSICAL EXAM:   Vitals:   08/27/16 1400  BP: 110/73  Pulse: 98  Resp: 18  Temp: 97.2 F (36.2 C)  TempSrc: Oral  SpO2: 98%  Weight: 208 lb (94.3 kg)  Height: '5\' 9"'$  (1.753 m)    GENERAL: The patient is a well-nourished male, in no acute distress. The vital signs are documented above. CARDIAC: There is a regular rate and rhythm.  VASCULAR: Palpable left femoral pulse, nonpalpable right. PULMONARY: Non-labored respirations ABDOMEN: Soft and non-tender with normal pitched bowel sounds.  MUSCULOSKELETAL: There are no major deformities or cyanosis. NEUROLOGIC: No focal weakness or paresthesias are detected. SKIN: There are no ulcers or rashes noted. PSYCHIATRIC: The patient has a normal affect.  STUDIES:   I have reviewed his CT scan which shows interval occlusion of the right limb of his aortic stent graft.  He also has a thrombosed right popliteal aneurysm.  MEDICAL ISSUES:   I suspect the patient's symptoms that developed last week are secondary to occlusion of the right limb of his aortic graft.  His popliteal aneurysm has probably been thrombosed for at least a year as he has had mild claudication symptoms for at least a year.  Currently the patient is actively undergoing chemotherapy for recurrent  metastatic lung cancer.  He does appear to be able to tolerate the discomfort associated with his occlusion.  He is  going to try and see if he can live with this or whether or not he needs to undergo a left-to-right femoral-femoral bypass graft.  He is going to contact me later this week once he makes a decision.  I would not intervene on his popliteal aneurysm as it has already thrombosed and therefore the risk of distal embolization is significantly decreased.    Annamarie Major, MD [facility] [phone] Pager [phone]

## 2016-08-27 NOTE — Patient Instructions (Addendum)
[place] at [facility] Discharge Instructions  RECOMMENDATIONS MADE BY THE CONSULTANT AND ANY TEST RESULTS WILL BE SENT TO YOUR REFERRING PHYSICIAN.  You were seen today by Dr. Twana First Return to clinic in 4 weeks for follow up visit Come back next Monday for chemotherapy  Thank you for choosing [place] at [facility] to provide your oncology and hematology care.  To afford each patient quality time with our provider, please arrive at least 15 minutes before your scheduled appointment time.    If you have a lab appointment with the [facility] please come in thru the  Main Entrance and check in at the main information desk  You need to re-schedule your appointment should you arrive 10 or more minutes late.  We strive to give you quality time with our providers, and arriving late affects you and other patients whose appointments are after yours.  Also, if you no show three or more times for appointments you may be dismissed from the clinic at the providers discretion.     Again, thank you for choosing [facility].  Our hope is that these requests will decrease the amount of time that you wait before being seen by our physicians.       _____________________________________________________________  Should you have questions after your visit to [facility], please contact our office at [phone] between the hours of 8:30 a.m. and 4:30 p.m.  Voicemails left after 4:30 p.m. will not be returned until the following business day.  For prescription refill requests, have your pharmacy contact our office.       Resources For Cancer Patients and their Caregivers ? [employer]: Can assist with transportation, wigs, general needs, runs Look Good Feel Better.        914-765-4137 ? Cancer Care: Provides financial assistance, online support groups, medication/co-pay assistance.  1-800-813-HOPE  (317)184-9278) ? [employer] Assists [place] cancer patients and their families through emotional , educational and financial support.  220 172 7770 ? [place] DSS Where to apply for food stamps, Medicaid and utility assistance. 952-403-6111 ? [employer]: Transportation to medical appointments. 7318643941 ? Social Security Administration: May apply for disability if have a Stage IV cancer. 605-098-0010 (207)691-9610 ? [employer], Disability and Transit Services: Assists with nutrition, care and transit needs. [place] Support Programs: '@10RELATIVEDAYS'$ @ > Cancer Support Group  2nd Tuesday of the month 1pm-2pm, Journey Room  > Creative Journey  3rd Tuesday of the month 1130am-1pm, Journey Room  > Look Good Feel Better  1st Wednesday of the month 10am-12 noon, Journey Room (Call [facility] to register [phone])

## 2016-08-27 NOTE — Progress Notes (Signed)
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

## 2016-08-27 NOTE — Progress Notes (Signed)
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       01/18/2015 Imaging    CT chest- 8.7 x 8.7 cm R hilar mass encasing the R pulm artery and occluding the R upper lobe bronchus. Sm R pleural effusion; malignant or reactive. 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 34 mm x 26 mm mass in body of pancreas      01/21/2015 Imaging    MRI brain- No acute intracranial findings. No signs of metastatic disease. Minor white matter disease, stable and nonspecific.      01/26/2015 Pathology Results    Lung, biopsy, right upper lobe - SMALL CELL CARCINOMA.      01/26/2015 Pathology Results    Diagnosis FINE NEEDLE ASPIRATION: ENDOSCOPIC SPECIMEN A, EBUS LEVEL 7 NODE (SPECIMEN 1 OF 3 COLLECTED 01/26/2015) MALIGNANT CELLS PRESENT, CONSISTENT WITH SMALL CELL CARCINOMA.      01/26/2015 Pathology Results    WANG NEEDLE ASPIRATION, SPECIMEN B LEVEL 7 NODE (SPECIMEN 2 OF 3 COLLECTED  01/26/2015) MALIGNANT CELLS PRESENT, CONSISTENT WITH SMALL CELL CARCINOMA.      01/26/2015 Pathology Results    Diagnosis BRONCHIAL BRUSHING SPECIMEN C, RIGHT UPPER LOBE (SPECIMEN 3 OF 3 COLLECTED 01/26/2015) ATYPICAL CELLS PRESENT.      01/27/2015 PET scan    Large hypermetabolic right hilar mass with mediastinal invasion and infrahilar extension, maximum standard uptake value 13.0.  Pancreatic tail mass, approximately 4.5 cm in diameter. Abdominal aortic aneurysm, 5.8 cm in diameter.      02/10/2015 Pathology Results    Diagnosis FINE NEEDLE ASPIRATION, ENDOSCOPIC, PANCREAS TAIL (SPECIMEN 1 OF 1 COLLECTED 02/10/15): MALIGNANT CELLS CONSISTENT WITH METASTATIC SMALL CELL CARCINOMA.      02/14/2015 - 03/06/2015 Chemotherapy    Cisplatin/Etoposide      03/06/2015 Treatment Plan Change    Change systemic therapy to Carboplatin based      03/07/2015 - 05/10/2015 Chemotherapy    Carboplatin/Etoposide.      04/13/2015 Imaging    resolution of pancreatic metastases, decrease in size of R perihilar mss and RUL mass. Interval response to therapy      05/10/2015 - 05/31/2015 Chemotherapy    Change to carboplatin/irinotecan for cycle #5 and #6 secondary to significant skin toxicity presumably from etoposide      07/12/2015 -  Radiation Therapy    25 Gy to whole brain and 30 Gy to R hilar region by Dr. MoLisbeth Renshaw     09/13/2015 Imaging    CT C/A/P unchanged from  2/28. persistent prominence of peribronchovascular intersitium throughout R lung, in perihilar region, no definitive findings to suggest progression of disease on exam      11/03/2015 Imaging    MRI brain 3 mm area of restricted diffusion R posterior temporal lobe without abnormal enhancement favor acute infarct, mild dural thickening enhancement over R hemisphere now present previously      11/15/2015 Imaging    CT C/A/P Interval development of 1.6 x 1.4 cm L adrenal nodule suspicious for metastatic lesion. No other sites of new  metastatic disease are noted elsewhere in C/A/P. Evolving postradiation changes in R lung      12/01/2015 - 12/14/2015 Radiation Therapy    Radiation therapy to L adrenal nodule      01/18/2016 Imaging    CT C/A/P 1. Stable appearance of the chest. Specifically, there are post XRT findings in the right lung with perihilar and hilar airway thickening, some upper lobe airway plugging, and scattered densities which merit observation but which are likely therapy rib related. 2. Centrilobular emphysema  3. Coronary, aortic arch, and branch vessel atherosclerotic vascular disease 4. On prior PET-CT from 01/27/2015 there was a pancreatic body mass. Prior biopsy from 02/10/2015 showed this to be consistent with metastatic small cell carcinoma. This mass is not visible on today's CT scan, and has presumably resolved with treatment. There is a transition from a somewhat atrophic pancreatic tail to a normal caliber pancreatic body occurring in the vicinity of the prior mass. 5. Reduced size of a small left adrenal mass, currently 1.2 by 1.7 cm 6. Infrarenal abdominal aortic aneurysm up to 5.8 cm diameter, with patent aorta bi-iliac stent graft in place and no evidence of endoleak 7. Lumbar spondylosis and degenerative disc disease with mild impingement at L3-4 and L4-5      02/02/2016 Imaging    No evidence of metastatic disease to the brain.Resolution of small right hemispheric fluid collection. No leptomeningeal enhancement identified on today's study. This finding may have been due to a small subdural hematoma previously which has resolved.Negative for acute infarct. Small area of restricted diffusion in the right posterior temporal lobe on the prior study likely was an area of acute infarct which has resolved.      04/13/2016 Imaging    CT CAP- 1. Relatively similar appearance of areas of right-sided radiation change. No well-defined residual or recurrent disease identified. 2. No thoracic  adenopathy. 3. No evidence of metastatic disease in the abdomen or pelvis.      07/10/2016 Imaging    MRI brain- 1. No evidence of intracranial metastatic disease within limitations of mild motion artifact. 2. Unchanged subcentimeter lesion in the clivus. Small osseous metastasis not excluded.      07/20/2016 Imaging    CT CAP- 9.5 x 4.6 cm central right upper lobe mass extending to the perihilar region, new, compatible with lung cancer recurrence. Associated subpleural nodularity in the right upper lobe, suspicious for metastatic disease.  Associated mild mediastinal lymphadenopathy, compatible with nodal metastases.  Multifocal hepatic lesions in both lobes, new, compatible with hepatic metastases, with index lesions as above.  Additional ancillary findings as above.      07/20/2016 Relapse/Recurrence    Last Treatment Date: 12/14/15 XRT; 06/10/15 Chemotherapy Recent Lab Values: Recent Lab Values:  N/A      08/23/2016 Imaging    c/o RLE pain. Doppler ultrasound done and referred to vascular surgeon: IMPRESSION: No evidence of DVT with the right lower extremity. 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 Arterial Doppler or CT angiogram is recommended for further evaluation.       HISTORY OF PRESENTING ILLNESS:  [name] 56 y.o. male is here for further up of extensive stage SCLC. He is accompanied by family today.  Mr. Mcevoy presents today for continuing follow up. Patient is scheduled for cycle 2 carboplatin D1 / Irinotecan D1,8 q21d today. He has been tolerating chemotherapy well without any side effects.  He has been having right leg claudication.  A CTA aorto-bifemoral was ordered which demonstrated 1. Aorto bi-iliac stent graft with interval finding of occlusion of the right iliac limb of the stent. Mild aneurysmal dilatation of the distal right SFA with near occlusive thrombus present in the distal ossific. Severe  narrowing or possible occlusion of the proximal popliteal artery with aneurysmal dilatation of the right popliteal artery which is also occluded. There is occlusion of the proximal calf arteries with string like flow visualized in the posterior tibial and peroneal arteries from about the mid calf to just above the ankle. 2. Mild aneurysmal dilatation of the left common femoral artery. Mild aneurysmal dilatation of the distal left superficial femoral artery. Mild to moderate diffuse disease of the left SFA and popliteal vessels. Single vessel runoff on the left via the posterior tibial artery.  He has an appointment with vascular surgery today.Marland Kitchen He notes after walking for a short distance he develops pain all throughout his right leg. He wanted to know what to do if the chemo will interfere with his aneurysm.   MEDICAL HISTORY:  Past Medical History:  Diagnosis Date  . AAA (abdominal aortic aneurysm) ([place])   . Arthritis   . COPD (chronic obstructive pulmonary disease) ([place])   . Coronary artery disease 2002   Lesion in LAD s/p angioplasty; s/p CABG x 4 Sept 2013  . Essential hypertension, benign   . Hyperlipidemia   . Myocardial infarct ([place]) 1999, 2002  . Pancreatitis   . Small cell lung cancer ([place])    Stage IIIB with extension the pancreas    SURGICAL HISTORY: Past Surgical History:  Procedure Laterality Date  . ABDOMINAL AORTIC ENDOVASCULAR STENT GRAFT N/A 08/11/2015   Procedure: ABDOMINAL AORTIC ENDOVASCULAR STENT GRAFT;  Surgeon: Serafina Mitchell, MD;  Location: [facility] OR;  Service: Vascular;  Laterality: N/A;  . [place], 2002  . CORONARY ARTERY BYPASS GRAFT  01/02/2012   Procedure: CORONARY ARTERY BYPASS GRAFTING (CABG);  Surgeon: [name], MD;  Location: [place];  Service: Open Heart Surgery;  Laterality: N/A;  times three using left internal mammary artery and right endoscopically harvested saphenous vein  . EUS N/A 02/10/2015   Procedure: UPPER ENDOSCOPIC  ULTRASOUND (EUS) LINEAR;  Surgeon: [name], MD;  Location: [facility] ENDOSCOPY;  Service: Endoscopy;  Laterality: N/A;  . KNEE SURGERY Left    ACL repair  . LEFT HEART CATHETERIZATION WITH CORONARY ANGIOGRAM N/A 01/01/2012   Procedure: LEFT HEART CATHETERIZATION WITH CORONARY ANGIOGRAM;  Surgeon: Hillary Bow, MD;  Location: [facility] CATH LAB;  Service: Cardiovascular;  Laterality: N/A;  . LUNG BIOPSY N/A 01/26/2015   Procedure: LUNG BIOPSY;  Surgeon: [name], MD;  Location: [place];  Service: Thoracic;  Laterality: N/A;  . LYMPH NODE BIOPSY N/A 01/26/2015   Procedure: LYMPH NODE BIOPSY;  Surgeon: [name], MD;  Location: [place];  Service: Thoracic;  Laterality: N/A;  . MULTIPLE EXTRACTIONS WITH ALVEOLOPLASTY  02/21/2012   Procedure: MULTIPLE EXTRACION  WITH ALVEOLOPLASTY;  Surgeon: Lenn Cal, DDS;  Location: [facility] ORS;  Service: Oral Surgery;  Laterality: N/A;  Extaction of tooth #'s 4,5,6,7,10,11,12,13,14,15,19,20,21,22,23,24,25,   . PORTACATH PLACEMENT Left 02/01/2015   Procedure: ATTEMPTED INSERTION OF PORT-A-CATH;  Surgeon: [name], MD;  Location: [place];  Service: Thoracic;  Laterality: Left;  . TEE WITHOUT CARDIOVERSION  01/02/2012   Procedure: TRANSESOPHAGEAL ECHOCARDIOGRAM (TEE);  Surgeon: [name], MD;  Location: [place];  Service: Open Heart Surgery;  Laterality: N/A;  . VIDEO BRONCHOSCOPY WITH ENDOBRONCHIAL ULTRASOUND N/A 01/26/2015   Procedure: VIDEO BRONCHOSCOPY WITH ENDOBRONCHIAL ULTRASOUND;  Surgeon: [name], MD;  Location: [place];  Service: Thoracic;  Laterality: N/A;    SOCIAL HISTORY: Social History   Social History  . Marital status: Married    Spouse name: N/A  . Number of children: 5  . Years of education: N/A   Occupational History  . Works in [employer]  . Smoking status: Current Every Day Smoker    Packs/day: 2.00    Years: 35.00    Types: Cigarettes    Start date: 06/30/1976  . Smokeless  tobacco: Never Used  . Alcohol use No     Comment: Maybe a 12 pack/mth if that - very rare since 2013  . Drug use: No  . Sexual activity: Yes   Other Topics Concern  . Not on file   Social History Narrative  . No narrative on file   They've been married 17 years, with five kids.  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 Heart disease Father   . Diabetes Father   . Hyperlipidemia Father   . Hypertension Father   . Hyperlipidemia Mother   . Hypertension Mother   . Stroke Mother   . Diabetes Paternal Grandmother   . Cancer Neg Hx    indicated that his mother is alive. He indicated that his father is alive. He indicated that his sister is alive. He indicated that his brother is alive. He indicated that his maternal grandmother is deceased. He indicated that his maternal grandfather is deceased. He indicated that his paternal grandmother is deceased. He indicated that his paternal grandfather is deceased. He indicated that the status of his neg hx is unknown.    His father's somewhere in [place]; his mother lives here in [place] and works at [employer] 76 at the end of this month She's got high blood pressure, high cholesterol, no strokes that they know of; is on blood thinners Has an aneurism at the base of the aorta where it goes into your stomach He knows his father's had high blood pressure and diabetes; Parents have been divorced since he was 3-4 Has half brothers / half sisters   ALLERGIES:  is allergic to lisinopril and tea.  MEDICATIONS:  Current Outpatient Prescriptions  Medication Sig Dispense Refill  .  amLODipine (NORVASC) 5 MG tablet TAKE ONE (1) TABLET BY MOUTH EVERY DAY 90 tablet 3  . aspirin EC 81 MG tablet Take 81 mg by mouth daily.    Marland Kitchen  benzonatate (TESSALON PERLES) 100 MG capsule Take 1 capsule (100 mg total) by mouth 3 (three) times daily as needed for cough. (Patient not taking: Reported on 08/23/2016) 30 capsule 0  . CARBOPLATIN IV Inject into the vein. Day 1 every 28 days    . carvedilol (COREG) 6.25 MG tablet TAKE ONE TABLET BY MOUTH TWICE A DAY WITH A MEAL 180 tablet 1  . dexamethasone (DECADRON) 4 MG tablet Take 2 tablets (8 mg total) by mouth daily. Start the day after chemo for 2 days. 8 tablet 5  . guaiFENesin-codeine 100-10 MG/5ML syrup Take 10 mLs by mouth every 6 (six) hours as needed for cough. (Patient not taking: Reported on 08/23/2016) 140 mL 0  . IRINOTECAN HCL IV Inject into the vein. Day 1, day 8, day 15 every 28 days    . loperamide (IMODIUM A-D) 2 MG tablet Take 2 at diarrhea onset , then 1 every 2hr until 12hrs with no BM. May take 2 every 4hrs at night. If diarrhea recurs repeat. 100 tablet 1  . ondansetron (ZOFRAN) 8 MG tablet Take 1 tablet (8 mg total) by mouth 2 (two) times daily as needed for refractory nausea / vomiting. Start on day 3 after chemotherapy. 30 tablet 1  . pravastatin (PRAVACHOL) 40 MG tablet TAKE ONE (1) TABLET BY MOUTH EVERY DAY 90 tablet 2  . predniSONE (DELTASONE) 20 MG tablet Take 1 tablet (20 mg total) by mouth daily with breakfast. (Patient not taking: Reported on 08/23/2016) 7 tablet 0  . prochlorperazine (COMPAZINE) 10 MG tablet Take 1 tablet (10 mg total) by mouth every 6 (six) hours as needed (NAUSEA). 30 tablet 1   No current facility-administered medications for this visit.     Review of Systems  Constitutional: Negative.   HENT: Negative.   Eyes: Negative.   Cardiovascular: Negative.  Negative for leg swelling.  Gastrointestinal: Negative.   Genitourinary: Negative.   Musculoskeletal:       Aneurysm behind right knee. Associated  right leg pain.  Skin: Negative.   Neurological: Negative.   Endo/Heme/Allergies: Negative.   All other systems reviewed and are negative. 14 point ROS was done and is otherwise as detailed above or in HPI   PHYSICAL EXAMINATION: ECOG PERFORMANCE STATUS: 0 - Asymptomatic    Physical Exam  Constitutional: He is oriented to person, place, and time.  HENT:  Head: Normocephalic and atraumatic.  Nose: Nose normal.  Dentures on top and bottom.   Eyes: Conjunctivae and EOM are normal. Pupils are equal, round, and reactive to light.  Neck: Normal range of motion. Neck supple.  Cardiovascular: Normal rate, regular rhythm and normal heart sounds.  Exam reveals no gallop and no friction rub.   No murmur heard. Pulmonary/Chest: Effort normal. No respiratory distress. He has no wheezes. He has no rales. He exhibits no tenderness.  Abdominal: Soft. Bowel sounds are normal. He exhibits no distension and no mass. There is no tenderness. There is no rebound and no guarding.  Musculoskeletal: Normal range of motion.  Neurological: He is alert and oriented to person, place, and time. He has normal reflexes.  Skin: Skin is warm and dry.  Psychiatric: Judgment normal.  Nursing [name] and vitals reviewed.   LABORATORY DATA:  I have reviewed the data as listed.  CBC    Component Value Date/Time   WBC 4.4 08/27/2016 0905   RBC 3.77 (L) 08/27/2016 0905   HGB 11.2 (L) 08/27/2016 0905   HCT 33.3 (L) 08/27/2016 2992  PLT 149 (L) 08/27/2016 0905   MCV 88.3 08/27/2016 0905   MCH 29.7 08/27/2016 0905   MCHC 33.6 08/27/2016 0905   RDW 16.4 (H) 08/27/2016 0905   LYMPHSABS 0.7 08/27/2016 0905   MONOABS 0.7 08/27/2016 0905   EOSABS 0.1 08/27/2016 0905   BASOSABS 0.0 08/27/2016 0905   CMP     Component Value Date/Time   NA 131 (L) 08/27/2016 0905   K 3.7 08/27/2016 0905   CL 97 (L) 08/27/2016 0905   CO2 27 08/27/2016 0905   GLUCOSE 154 (H) 08/27/2016 0905   BUN 15 08/27/2016 0905   CREATININE  1.11 08/27/2016 0905   CREATININE 0.99 01/27/2015 1450   CALCIUM 8.7 (L) 08/27/2016 0905   PROT 7.3 08/27/2016 0905   ALBUMIN 3.5 08/27/2016 0905   AST 17 08/27/2016 0905   ALT 13 (L) 08/27/2016 0905   ALKPHOS 53 08/27/2016 0905   BILITOT 0.5 08/27/2016 0905   GFRNONAA >60 08/27/2016 0905   GFRNONAA 87 01/27/2015 1450   GFRAA >60 08/27/2016 0905   GFRAA >89 01/27/2015 1450    RADIOGRAPHIC STUDIES: I have personally reviewed the radiological images as listed and agreed with the findings in the report. Study Result   CLINICAL DATA:  Restaging of extensive stage small cell lung cancer with pancreatic mass. Status post chemotherapy. COPD. Endovascular stent graft. Right-sided primary.  EXAM: CT CHEST, ABDOMEN, AND PELVIS WITH CONTRAST  TECHNIQUE: Multidetector CT imaging of the chest, abdomen and pelvis was performed following the standard protocol during bolus administration of intravenous contrast.  CONTRAST:  158m ISOVUE-300 IOPAMIDOL (ISOVUE-300) INJECTION 61%  COMPARISON:  01/18/2016  FINDINGS: CT CHEST FINDINGS  Cardiovascular: Prior median sternotomy. Aortic and branch vessel atherosclerosis. Tortuous thoracic aorta. Normal heart size, without pericardial effusion. Pulmonary artery enlargement, 3.4 cm outflow tract. No central pulmonary embolism, on this non-dedicated study.  Mediastinum/Nodes: No supraclavicular adenopathy. No mediastinal adenopathy. Right perihilar soft tissue thickening is unchanged, without well-defined adenopathy.  Lungs/Pleura: No pleural fluid. Right upper lobe bronchial compression anteriorly is similar. Moderate bronchial wall thickening. Moderate centrilobular and paraseptal emphysema.  Interstitial thickening within the peribronchovascular inferior right upper lobe is similar, including image 57/series 4. There is also subpleural reticulation in the more peripheral posterior right upper lobe and superior segment right  lower lobe. No well-defined residual or recurrent mass. Anterior right upper lobe subpleural linear opacities are likely radiation induced and new. Thickening of the right major fissure is felt to be similar, including on image 63/series 4.  Musculoskeletal: No acute osseous abnormality.  CT ABDOMEN PELVIS FINDINGS  Hepatobiliary: Normal liver. Normal gallbladder, without biliary ductal dilatation.  Pancreas: No residual or recurrent pancreatic mass. Atrophy involving the tail is again identified.  Spleen: Normal in size, without focal abnormality.  Adrenals/Urinary Tract: Normal adrenal glands. Normal kidneys, without hydronephrosis. Normal urinary bladder.  This  Stomach/Bowel: Normal stomach, without wall thickening. Normal colon, appendix, and terminal ileum. Normal small bowel.  Vascular/Lymphatic: Aortic and branch vessel atherosclerosis. Status post endograft repair. Native sac measures on the order of 5.0 by 4.6 cm versus 5.5 x 5.8 on the prior. No endoleak. Patent iliac volumes. There is persistent dilatation of proximal internal iliac arteries including at 1.5 cm on the right, similar. No abdominopelvic adenopathy.  Reproductive: Normal prostate.  Other: No significant free fluid. No evidence of omental or peritoneal disease.  Musculoskeletal: No acute osseous abnormality. L3-4 and L4-5 disc bulges.  IMPRESSION: 1. Relatively similar appearance of areas of right-sided radiation change. No well-defined residual  or recurrent disease identified. 2. No thoracic adenopathy. 3. No evidence of metastatic disease in the abdomen or pelvis. 4.  Aortic atherosclerosis. 5. Status post aortic endograft repair, with decreased size native sac. Persistent dilatation of the proximal internal iliac arteries bilaterally. 6. Pulmonary artery enlargement suggests pulmonary arterial hypertension.   Electronically Signed   By: Abigail Miyamoto M.D.   On:  04/13/2016 13:46   Right LOWER EXTREMITY VENOUS DOPPLER ULTRASOUND 08/23/2016  IMPRESSION: No evidence of DVT with the right lower extremity. 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 Arterial Doppler or CT angiogram is recommended for further evaluation. These results will be called to the ordering clinician or representative by the Radiologist Assistant, and communication documented in the PACS or zVision Dashboard.   CT ANGIOGRAPHY OF ABDOMINAL AORTA WITH ILIOFEMORAL RUNOFF 08/23/2016  IMPRESSION: VASCULAR  1. Aorto bi-iliac stent graft with interval finding of occlusion of the right iliac limb of the stent. Mild aneurysmal dilatation of the distal right SFA with near occlusive thrombus present in the distal ossific. Severe narrowing or possible occlusion of the proximal popliteal artery with aneurysmal dilatation of the right popliteal artery which is also occluded. There is occlusion of the proximal calf arteries with string like flow visualized in the posterior tibial and peroneal arteries from about the mid calf to just above the ankle. 2. Mild aneurysmal dilatation of the left common femoral artery. Mild aneurysmal dilatation of the distal left superficial femoral artery. Mild to moderate diffuse disease of the left SFA and popliteal vessels. Single vessel runoff on the left via the posterior tibial artery  NON-VASCULAR  Partially visualized right hilar and peribronchial soft tissue mass. Hepatic metastatic disease as previously demonstrated. There are no acute intra-abdominal or pelvic abnormalities visualized.  Critical Value/emergent results were called by telephone at the time of interpretation on 08/23/2016 at 9:54 pm to Dr. Milton Ferguson , who verbally acknowledged these results.    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 Large hypermetabolic right hilar mass with mediastinal invasion and infrahilar extension  Hyponatremia c/w SIADH  EUS with Dr. JaArdis Hughsnd pancreatic biopsy on 02/10/2015 c/w SCLC Anemia secondary to chemotherapy CT C/A/P 04/13/2015 with interval response to therapy. Presumed drug reaction to etoposide Treatment related anemia CR to first line therapy R calf pain ABNL MRI brain 11/03/2015 L adrenal nodule Abnormal MRI of the brain  PLAN: - I believe his right leg claudication is due to his occluded right iliac limb of his stent. I have advised him that if he wants to have intervention for this he needs to let his vascular surgeon know that he is on chemo since chemo can potentially cause cytopenias and put him at bleeding risk during surgery. He states he would like to hold off on chemo until he can get this cluadication issue taken care of. I will tentatively delay his chemotherapy until next Monday. We may have to delay further depending on when his  surgery is taking place.   RTC in 4 weeks.       All questions were answered. The patient knows to call the clinic with any problems, questions or concerns.  This document serves as a record of services personally performed by Twana First, MD. It was created on her behalf by Shirlean Mylar, a trained medical scribe. The creation of this record is based on the scribe's personal observations and the provider's statements to them. This document has been checked and approved by the attending provider.  I have reviewed the above  documentation for accuracy and completeness, and I agree with the above.  This [name] was electronically signed.    Twana First, MD 08/06/16

## 2016-09-03 ENCOUNTER — Encounter (HOSPITAL_BASED_OUTPATIENT_CLINIC_OR_DEPARTMENT_OTHER): Payer: Medicaid Other

## 2016-09-03 ENCOUNTER — Encounter (HOSPITAL_COMMUNITY): Payer: Self-pay

## 2016-09-03 VITALS — BP 101/67 | HR 93 | Temp 98.2°F | Resp 18 | Wt 210.4 lb

## 2016-09-03 DIAGNOSIS — Z5111 Encounter for antineoplastic chemotherapy: Secondary | ICD-10-CM

## 2016-09-03 DIAGNOSIS — K8689 Other specified diseases of pancreas: Secondary | ICD-10-CM

## 2016-09-03 DIAGNOSIS — C3411 Malignant neoplasm of upper lobe, right bronchus or lung: Secondary | ICD-10-CM

## 2016-09-03 DIAGNOSIS — C7972 Secondary malignant neoplasm of left adrenal gland: Secondary | ICD-10-CM

## 2016-09-03 DIAGNOSIS — C3490 Malignant neoplasm of unspecified part of unspecified bronchus or lung: Secondary | ICD-10-CM

## 2016-09-03 MED ORDER — SODIUM CHLORIDE 0.9 % IV SOLN
Freq: Once | INTRAVENOUS | Status: AC
  Administered 2016-09-03: 11:00:00 via INTRAVENOUS

## 2016-09-03 MED ORDER — SODIUM CHLORIDE 0.9% FLUSH
3.0000 mL | INTRAVENOUS | Status: DC | PRN
  Administered 2016-09-03: 3 mL via INTRAVENOUS
  Filled 2016-09-03: qty 10

## 2016-09-03 MED ORDER — IRINOTECAN HCL CHEMO INJECTION 100 MG/5ML
60.0000 mg/m2 | Freq: Once | INTRAVENOUS | Status: AC
  Administered 2016-09-03: 120 mg via INTRAVENOUS
  Filled 2016-09-03: qty 5

## 2016-09-03 MED ORDER — CARBOPLATIN CHEMO INJECTION 600 MG/60ML
628.0000 mg | Freq: Once | INTRAVENOUS | Status: AC
  Administered 2016-09-03: 630 mg via INTRAVENOUS
  Filled 2016-09-03: qty 63

## 2016-09-03 MED ORDER — ATROPINE SULFATE 1 MG/ML IJ SOLN
0.5000 mg | Freq: Once | INTRAMUSCULAR | Status: AC | PRN
  Administered 2016-09-03: 0.5 mg via INTRAVENOUS
  Filled 2016-09-03: qty 1

## 2016-09-03 MED ORDER — PALONOSETRON HCL INJECTION 0.25 MG/5ML
0.2500 mg | Freq: Once | INTRAVENOUS | Status: AC
  Administered 2016-09-03: 0.25 mg via INTRAVENOUS
  Filled 2016-09-03: qty 5

## 2016-09-03 MED ORDER — DEXAMETHASONE SODIUM PHOSPHATE 10 MG/ML IJ SOLN
10.0000 mg | Freq: Once | INTRAMUSCULAR | Status: AC
  Administered 2016-09-03: 10 mg via INTRAVENOUS
  Filled 2016-09-03: qty 1

## 2016-09-03 NOTE — Patient Instructions (Signed)
[facility] Discharge Instructions for Patients Receiving Chemotherapy   Beginning January 23rd 2017 lab work for the [facility] will be done in the  Main lab at [facility] on 1st floor. If you have a lab appointment with the [facility] please come in thru the  Main Entrance and check in at the main information desk   Today you received the following chemotherapy agents Irinotecan and Carboplatin. Follow-up as scheduled. Call clinic for any questions or concerns  To help prevent nausea and vomiting after your treatment, we encourage you to take your nausea medication   If you develop nausea and vomiting, or diarrhea that is not controlled by your medication, call the clinic.  The clinic phone number is [phone]. Office hours are Monday-Friday 8:30am-5:00pm.  BELOW ARE SYMPTOMS THAT SHOULD BE REPORTED IMMEDIATELY:  *FEVER GREATER THAN 101.0 F  *CHILLS WITH OR WITHOUT FEVER  NAUSEA AND VOMITING THAT IS NOT CONTROLLED WITH YOUR NAUSEA MEDICATION  *UNUSUAL SHORTNESS OF BREATH  *UNUSUAL BRUISING OR BLEEDING  TENDERNESS IN MOUTH AND THROAT WITH OR WITHOUT PRESENCE OF ULCERS  *URINARY PROBLEMS  *BOWEL PROBLEMS  UNUSUAL RASH Items with * indicate a potential emergency and should be followed up as soon as possible. If you have an emergency after office hours please contact your primary care physician or go to the nearest emergency department.  Please call the clinic during office hours if you have any questions or concerns.   You may also contact the Patient Navigator at [phone] should you have any questions or need assistance in obtaining follow up care.      Resources For Cancer Patients and their Caregivers ? American Cancer Society: Can assist with transportation, wigs, general needs, runs Look Good Feel Better.        715-851-2923 ? Cancer Care: Provides financial assistance, online support groups, medication/co-pay assistance.   1-800-813-HOPE 647-120-0169) ? [facility] Assists [place] cancer patients and their families through emotional , educational and financial support.  8103092069 ? [place] DSS Where to apply for food stamps, Medicaid and utility assistance. 256-652-8784 ? [employer]: Transportation to medical appointments. 510 814 6516 ? Social Security Administration: May apply for disability if have a Stage IV cancer. 941-333-7906 (936) [phone] ? [employer], Disability and Transit Services: Assists with nutrition, care and transit needs. 336-693-2059

## 2016-09-03 NOTE — Progress Notes (Signed)
1040 OK to use labs from 08/27/16 per Dr. Talbert Cage for chemo tx today since pt did not receive chemo last week              [name] tolerated chemo tx well without complaints or incident. VSS upon discharge. Pt discharged self ambulatory in satisfactory condition accompanied by his wife

## 2016-09-10 ENCOUNTER — Encounter (HOSPITAL_COMMUNITY): Payer: Self-pay

## 2016-09-10 ENCOUNTER — Encounter (HOSPITAL_COMMUNITY): Payer: Medicaid Other | Attending: Oncology

## 2016-09-10 VITALS — BP 115/67 | HR 83 | Temp 97.7°F | Resp 20 | Wt 209.7 lb

## 2016-09-10 DIAGNOSIS — K869 Disease of pancreas, unspecified: Secondary | ICD-10-CM | POA: Diagnosis present

## 2016-09-10 DIAGNOSIS — C7972 Secondary malignant neoplasm of left adrenal gland: Secondary | ICD-10-CM

## 2016-09-10 DIAGNOSIS — Z5111 Encounter for antineoplastic chemotherapy: Secondary | ICD-10-CM

## 2016-09-10 DIAGNOSIS — C3411 Malignant neoplasm of upper lobe, right bronchus or lung: Secondary | ICD-10-CM

## 2016-09-10 DIAGNOSIS — C3490 Malignant neoplasm of unspecified part of unspecified bronchus or lung: Secondary | ICD-10-CM | POA: Insufficient documentation

## 2016-09-10 DIAGNOSIS — K8689 Other specified diseases of pancreas: Secondary | ICD-10-CM

## 2016-09-10 LAB — COMPREHENSIVE METABOLIC PANEL
ALBUMIN: 3.9 g/dL (ref 3.5–5.0)
ALK PHOS: 53 U/L (ref 38–126)
ALT: 14 U/L — ABNORMAL LOW (ref 17–63)
ANION GAP: 9 (ref 5–15)
AST: 21 U/L (ref 15–41)
BUN: 11 mg/dL (ref 6–20)
CO2: 28 mmol/L (ref 22–32)
Calcium: 9 mg/dL (ref 8.9–10.3)
Chloride: 96 mmol/L — ABNORMAL LOW (ref 101–111)
Creatinine, Ser: 1.15 mg/dL (ref 0.61–1.24)
GFR calc Af Amer: >60 mL/min (ref >60)
GFR calc non Af Amer: >60 mL/min (ref >60)
GLUCOSE: 108 mg/dL — AB (ref 65–99)
Potassium: 3.9 mmol/L (ref 3.5–5.1)
Sodium: 133 mmol/L — ABNORMAL LOW (ref 135–145)
Total Bilirubin: 0.5 mg/dL (ref 0.3–1.2)
Total Protein: 7.4 g/dL (ref 6.5–8.1)

## 2016-09-10 LAB — CBC WITH DIFFERENTIAL/PLATELET
Basophils Absolute: 0 10*3/uL (ref 0.0–0.1)
Basophils Relative: 0 %
Eosinophils Absolute: 0.1 10*3/uL (ref 0.0–0.7)
Eosinophils Relative: 2 %
HEMATOCRIT: 31.2 % — AB (ref 39.0–52.0)
Hemoglobin: 10.8 g/dL — ABNORMAL LOW (ref 13.0–17.0)
LYMPHS PCT: 24 %
Lymphs Abs: 0.9 10*3/uL (ref 0.7–4.0)
MCH: 30.4 pg (ref 26.0–34.0)
MCHC: 34.6 g/dL (ref 30.0–36.0)
MCV: 87.9 fL (ref 78.0–100.0)
MONO ABS: 0.5 10*3/uL (ref 0.1–1.0)
MONOS PCT: 13 %
NEUTROS ABS: 2.3 10*3/uL (ref 1.7–7.7)
Neutrophils Relative %: 61 %
Platelets: 162 10*3/uL (ref 150–400)
RBC: 3.55 MIL/uL — ABNORMAL LOW (ref 4.22–5.81)
RDW: 16.2 % — AB (ref 11.5–15.5)
WBC: 3.8 10*3/uL — ABNORMAL LOW (ref 4.0–10.5)

## 2016-09-10 MED ORDER — IRINOTECAN HCL CHEMO INJECTION 100 MG/5ML
60.0000 mg/m2 | Freq: Once | INTRAVENOUS | Status: AC
  Administered 2016-09-10: 120 mg via INTRAVENOUS
  Filled 2016-09-10: qty 1

## 2016-09-10 MED ORDER — DEXAMETHASONE SODIUM PHOSPHATE 10 MG/ML IJ SOLN
10.0000 mg | Freq: Once | INTRAMUSCULAR | Status: AC
  Administered 2016-09-10: 10 mg via INTRAVENOUS
  Filled 2016-09-10: qty 1

## 2016-09-10 MED ORDER — SODIUM CHLORIDE 0.9 % IV SOLN
Freq: Once | INTRAVENOUS | Status: AC
  Administered 2016-09-10: 12:00:00 via INTRAVENOUS

## 2016-09-10 MED ORDER — PALONOSETRON HCL INJECTION 0.25 MG/5ML
0.2500 mg | Freq: Once | INTRAVENOUS | Status: AC
  Administered 2016-09-10: 0.25 mg via INTRAVENOUS
  Filled 2016-09-10: qty 5

## 2016-09-10 MED ORDER — ATROPINE SULFATE 1 MG/ML IJ SOLN
0.5000 mg | Freq: Once | INTRAMUSCULAR | Status: DC | PRN
Start: 2016-09-10 — End: 2016-09-10
  Filled 2016-09-10: qty 1

## 2016-09-10 NOTE — Patient Instructions (Signed)
[facility] Discharge Instructions for Patients Receiving Chemotherapy   Beginning January 23rd 2017 lab work for the Cancer Center will be done in the  Main lab at [place] on 1st floor. If you have a lab appointment with the Cancer Center please come in thru the  Main Entrance and check in at the main information desk   Today you received the following chemotherapy agents   To help prevent nausea and vomiting after your treatment, we encourage you to take your nausea medication     If you develop nausea and vomiting, or diarrhea that is not controlled by your medication, call the clinic.  The clinic phone number is [phone]. Office hours are Monday-Friday 8:30am-5:00pm.  BELOW ARE SYMPTOMS THAT SHOULD BE REPORTED IMMEDIATELY:  *FEVER GREATER THAN 101.0 F  *CHILLS WITH OR WITHOUT FEVER  NAUSEA AND VOMITING THAT IS NOT CONTROLLED WITH YOUR NAUSEA MEDICATION  *UNUSUAL SHORTNESS OF BREATH  *UNUSUAL BRUISING OR BLEEDING  TENDERNESS IN MOUTH AND THROAT WITH OR WITHOUT PRESENCE OF ULCERS  *URINARY PROBLEMS  *BOWEL PROBLEMS  UNUSUAL RASH Items with * indicate a potential emergency and should be followed up as soon as possible. If you have an emergency after office hours please contact your primary care physician or go to the nearest emergency department.  Please call the clinic during office hours if you have any questions or concerns.   You may also contact the Patient Navigator at [phone] should you have any questions or need assistance in obtaining follow up care.      Resources For Cancer Patients and their Caregivers ? American Cancer Society: Can assist with transportation, wigs, general needs, runs Look Good Feel Better.        1-888-227-6333 ? Cancer Care: Provides financial assistance, online support groups, medication/co-pay assistance.  1-800-813-HOPE (4673) ? [facility] Assists [place] cancer  patients and their families through emotional , educational and financial support.  336-427-4357 ? [place] DSS Where to apply for food stamps, Medicaid and utility assistance. 336-342-1394 ? [employer]: Transportation to medical appointments. 336-347-2287 ? Social Security Administration: May apply for disability if have a Stage IV cancer. 336-342-7796 1-800-772-1213 ? [place] [employer]ging, Disability and Transit Services: Assists with nutrition, care and transit needs. 336-349-2343         

## 2016-09-10 NOTE — Progress Notes (Signed)
Labs reviewed with MD. Proceed with treatment.  Chemotherapy given today per orders. Patient tolerated it well without problems. Vitals stable and discharged home from clinic ambulatory. Follow up as scheduled.

## 2016-09-18 ENCOUNTER — Encounter (HOSPITAL_COMMUNITY): Payer: Medicaid Other

## 2016-09-18 ENCOUNTER — Encounter (HOSPITAL_COMMUNITY): Payer: Self-pay

## 2016-09-18 ENCOUNTER — Encounter (HOSPITAL_BASED_OUTPATIENT_CLINIC_OR_DEPARTMENT_OTHER): Payer: Medicaid Other

## 2016-09-18 VITALS — BP 117/75 | HR 86 | Temp 98.6°F | Resp 18 | Wt 205.0 lb

## 2016-09-18 DIAGNOSIS — C3491 Malignant neoplasm of unspecified part of right bronchus or lung: Secondary | ICD-10-CM | POA: Diagnosis present

## 2016-09-18 DIAGNOSIS — K8689 Other specified diseases of pancreas: Secondary | ICD-10-CM

## 2016-09-18 DIAGNOSIS — C3490 Malignant neoplasm of unspecified part of unspecified bronchus or lung: Secondary | ICD-10-CM

## 2016-09-18 DIAGNOSIS — C7972 Secondary malignant neoplasm of left adrenal gland: Secondary | ICD-10-CM

## 2016-09-18 DIAGNOSIS — C3411 Malignant neoplasm of upper lobe, right bronchus or lung: Secondary | ICD-10-CM

## 2016-09-18 DIAGNOSIS — Z5111 Encounter for antineoplastic chemotherapy: Secondary | ICD-10-CM

## 2016-09-18 LAB — COMPREHENSIVE METABOLIC PANEL
ALBUMIN: 4 g/dL (ref 3.5–5.0)
ALK PHOS: 51 U/L (ref 38–126)
ALT: 14 U/L — AB (ref 17–63)
ANION GAP: 9 (ref 5–15)
AST: 18 U/L (ref 15–41)
BILIRUBIN TOTAL: 0.8 mg/dL (ref 0.3–1.2)
BUN: 11 mg/dL (ref 6–20)
CALCIUM: 9.1 mg/dL (ref 8.9–10.3)
CO2: 29 mmol/L (ref 22–32)
Chloride: 96 mmol/L — ABNORMAL LOW (ref 101–111)
Creatinine, Ser: 1.25 mg/dL — ABNORMAL HIGH (ref 0.61–1.24)
GFR calc Af Amer: >60 mL/min (ref >60)
GFR calc non Af Amer: >60 mL/min (ref >60)
GLUCOSE: 133 mg/dL — AB (ref 65–99)
Potassium: 4 mmol/L (ref 3.5–5.1)
Sodium: 134 mmol/L — ABNORMAL LOW (ref 135–145)
TOTAL PROTEIN: 7.6 g/dL (ref 6.5–8.1)

## 2016-09-18 LAB — CBC WITH DIFFERENTIAL/PLATELET
BASOS ABS: 0 10*3/uL (ref 0.0–0.1)
BASOS PCT: 0 %
EOS PCT: 3 %
Eosinophils Absolute: 0.1 10*3/uL (ref 0.0–0.7)
HEMATOCRIT: 32.8 % — AB (ref 39.0–52.0)
Hemoglobin: 11.1 g/dL — ABNORMAL LOW (ref 13.0–17.0)
Lymphocytes Relative: 22 %
Lymphs Abs: 0.9 10*3/uL (ref 0.7–4.0)
MCH: 30.3 pg (ref 26.0–34.0)
MCHC: 33.8 g/dL (ref 30.0–36.0)
MCV: 89.6 fL (ref 78.0–100.0)
MONO ABS: 0.5 10*3/uL (ref 0.1–1.0)
MONOS PCT: 13 %
NEUTROS ABS: 2.6 10*3/uL (ref 1.7–7.7)
Neutrophils Relative %: 62 %
Platelets: 143 10*3/uL — ABNORMAL LOW (ref 150–400)
RBC: 3.66 MIL/uL — ABNORMAL LOW (ref 4.22–5.81)
RDW: 18 % — AB (ref 11.5–15.5)
WBC: 4.2 10*3/uL (ref 4.0–10.5)

## 2016-09-18 MED ORDER — PALONOSETRON HCL INJECTION 0.25 MG/5ML
0.2500 mg | Freq: Once | INTRAVENOUS | Status: AC
  Administered 2016-09-18: 0.25 mg via INTRAVENOUS
  Filled 2016-09-18: qty 5

## 2016-09-18 MED ORDER — IRINOTECAN HCL CHEMO INJECTION 100 MG/5ML
60.0000 mg/m2 | Freq: Once | INTRAVENOUS | Status: AC
  Administered 2016-09-18: 120 mg via INTRAVENOUS
  Filled 2016-09-18: qty 5

## 2016-09-18 MED ORDER — DEXAMETHASONE SODIUM PHOSPHATE 10 MG/ML IJ SOLN
10.0000 mg | Freq: Once | INTRAMUSCULAR | Status: AC
  Administered 2016-09-18: 10 mg via INTRAVENOUS
  Filled 2016-09-18: qty 1

## 2016-09-18 MED ORDER — SODIUM CHLORIDE 0.9 % IV SOLN
Freq: Once | INTRAVENOUS | Status: AC
  Administered 2016-09-18: 11:00:00 via INTRAVENOUS

## 2016-09-18 MED ORDER — ATROPINE SULFATE 1 MG/ML IJ SOLN
0.5000 mg | Freq: Once | INTRAMUSCULAR | Status: AC | PRN
  Administered 2016-09-18: 0.5 mg via INTRAVENOUS
  Filled 2016-09-18: qty 1

## 2016-09-18 NOTE — Progress Notes (Signed)
Tolerated tx w/o adverse reaction.  Alert, in no distress.  VSS.  Discharged ambulatory. 

## 2016-09-18 NOTE — Patient Instructions (Signed)
[facility] Discharge Instructions for Patients Receiving Chemotherapy   Beginning January 23rd 2017 lab work for the [facility] will be done in the  Main lab at [facility] on 1st floor. If you have a lab appointment with the [facility] please come in thru the  Main Entrance and check in at the main information desk   Today you received the following chemotherapy agents:  irinotecan  If you develop nausea and vomiting, or diarrhea that is not controlled by your medication, call the clinic.  The clinic phone number is [phone]. Office hours are Monday-Friday 8:30am-5:00pm.  BELOW ARE SYMPTOMS THAT SHOULD BE REPORTED IMMEDIATELY:  *FEVER GREATER THAN 101.0 F  *CHILLS WITH OR WITHOUT FEVER  NAUSEA AND VOMITING THAT IS NOT CONTROLLED WITH YOUR NAUSEA MEDICATION  *UNUSUAL SHORTNESS OF BREATH  *UNUSUAL BRUISING OR BLEEDING  TENDERNESS IN MOUTH AND THROAT WITH OR WITHOUT PRESENCE OF ULCERS  *URINARY PROBLEMS  *BOWEL PROBLEMS  UNUSUAL RASH Items with * indicate a potential emergency and should be followed up as soon as possible. If you have an emergency after office hours please contact your primary care physician or go to the nearest emergency department.  Please call the clinic during office hours if you have any questions or concerns.   You may also contact the Patient Navigator at [phone] should you have any questions or need assistance in obtaining follow up care.      Resources For Cancer Patients and their Caregivers ? American Cancer Society: Can assist with transportation, wigs, general needs, runs Look Good Feel Better.        408-873-8336 ? Cancer Care: Provides financial assistance, online support groups, medication/co-pay assistance.  1-800-813-HOPE 2487300801) ? [place] Assists [place] cancer patients and their families through emotional , educational and financial support.   (640)658-9193 ? [place] DSS Where to apply for food stamps, Medicaid and utility assistance. 431 772 2660 ? [employer]: Transportation to medical appointments. 210 087 6447 ? Social Security Administration: May apply for disability if have a Stage IV cancer. 646-063-1777 504-670-5145 ? [employer], Disability and Transit Services: Assists with nutrition, care and transit needs. 754-794-3726

## 2016-09-24 ENCOUNTER — Ambulatory Visit (HOSPITAL_COMMUNITY): Payer: Self-pay

## 2016-09-25 ENCOUNTER — Encounter (HOSPITAL_COMMUNITY): Payer: Self-pay

## 2016-09-25 ENCOUNTER — Encounter (HOSPITAL_COMMUNITY): Payer: Medicaid Other | Attending: Oncology | Admitting: Oncology

## 2016-09-25 VITALS — BP 109/52 | HR 95 | Temp 98.2°F | Resp 20 | Wt 207.0 lb

## 2016-09-25 DIAGNOSIS — E279 Disorder of adrenal gland, unspecified: Secondary | ICD-10-CM

## 2016-09-25 DIAGNOSIS — C3411 Malignant neoplasm of upper lobe, right bronchus or lung: Secondary | ICD-10-CM

## 2016-09-25 DIAGNOSIS — R0602 Shortness of breath: Secondary | ICD-10-CM

## 2016-09-25 DIAGNOSIS — D6481 Anemia due to antineoplastic chemotherapy: Secondary | ICD-10-CM

## 2016-09-25 DIAGNOSIS — R05 Cough: Secondary | ICD-10-CM

## 2016-09-25 DIAGNOSIS — Z720 Tobacco use: Secondary | ICD-10-CM

## 2016-09-25 DIAGNOSIS — C3491 Malignant neoplasm of unspecified part of right bronchus or lung: Secondary | ICD-10-CM | POA: Insufficient documentation

## 2016-09-25 DIAGNOSIS — D630 Anemia in neoplastic disease: Secondary | ICD-10-CM

## 2016-09-25 DIAGNOSIS — K869 Disease of pancreas, unspecified: Secondary | ICD-10-CM

## 2016-09-25 DIAGNOSIS — C7972 Secondary malignant neoplasm of left adrenal gland: Secondary | ICD-10-CM

## 2016-09-25 DIAGNOSIS — C3490 Malignant neoplasm of unspecified part of unspecified bronchus or lung: Secondary | ICD-10-CM

## 2016-09-25 NOTE — Patient Instructions (Signed)
[facility] Discharge Instructions  RECOMMENDATIONS MADE BY THE CONSULTANT AND ANY TEST RESULTS WILL BE SENT TO YOUR REFERRING PHYSICIAN.  You saw Dr. Zhou today.  Thank you for choosing [facility] to provide your oncology and hematology care.  To afford each patient quality time with our provider, please arrive at least 15 minutes before your scheduled appointment time.    If you have a lab appointment with the Cancer Center please come in thru the  Main Entrance and check in at the main information desk  You need to re-schedule your appointment should you arrive 10 or more minutes late.  We strive to give you quality time with our providers, and arriving late affects you and other patients whose appointments are after yours.  Also, if you no show three or more times for appointments you may be dismissed from the clinic at the providers discretion.     Again, thank you for choosing [facility].  Our hope is that these requests will decrease the amount of time that you wait before being seen by our physicians.       _____________________________________________________________  Should you have questions after your visit to [facility], please contact our office at [phone] between the hours of 8:30 a.m. and 4:30 p.m.  Voicemails left after 4:30 p.m. will not be returned until the following business day.  For prescription refill requests, have your pharmacy contact our office.       Resources For Cancer Patients and their Caregivers ? American Cancer Society: Can assist with transportation, wigs, general needs, runs Look Good Feel Better.        1-888-227-6333 ? Cancer Care: Provides financial assistance, online support groups, medication/co-pay assistance.  1-800-813-HOPE (4673) ? [facility] Assists [place] cancer patients and their families through  emotional , educational and financial support.  336-427-4357 ? [place] DSS Where to apply for food stamps, Medicaid and utility assistance. 336-342-1394 ? [employer]: Transportation to medical appointments. 336-347-2287 ? Social Security Administration: May apply for disability if have a Stage IV cancer. 336-342-7796 1-800-772-1213 ? [place] Aging, Disability and Transit Services: Assists with nutrition, care and transit needs. 336-349-2343  Cancer Center Support Programs: [email or web address]@ > Cancer Support Group  2nd Tuesday of the month 1pm-2pm, Journey Room  > Creative Journey  3rd Tuesday of the month 1130am-1pm, Journey Room  > Look Good Feel Better  1st Wednesday of the month 10am-12 noon, Journey Room (Call American Cancer Society to register 1-800-395-5775)    

## 2016-09-25 NOTE — Progress Notes (Signed)
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       01/18/2015 Imaging    CT chest- 8.7 x 8.7 cm R hilar mass encasing the R pulm artery and occluding the R upper lobe bronchus. Sm R pleural effusion; malignant or reactive. 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 34 mm x 26 mm mass in body of pancreas      01/21/2015 Imaging    MRI brain- No acute intracranial findings. No signs of metastatic disease. Minor white matter disease, stable and nonspecific.      01/26/2015 Pathology Results    Lung, biopsy, right upper lobe - SMALL CELL CARCINOMA.      01/26/2015 Pathology Results    Diagnosis FINE NEEDLE ASPIRATION: ENDOSCOPIC SPECIMEN A, EBUS LEVEL 7 NODE (SPECIMEN 1 OF 3 COLLECTED 01/26/2015) MALIGNANT CELLS PRESENT, CONSISTENT WITH SMALL CELL CARCINOMA.      01/26/2015 Pathology Results    WANG NEEDLE ASPIRATION, SPECIMEN B LEVEL 7 NODE (SPECIMEN 2 OF 3 COLLECTED 01/26/2015) MALIGNANT  CELLS PRESENT, CONSISTENT WITH SMALL CELL CARCINOMA.      01/26/2015 Pathology Results    Diagnosis BRONCHIAL BRUSHING SPECIMEN C, RIGHT UPPER LOBE (SPECIMEN 3 OF 3 COLLECTED 01/26/2015) ATYPICAL CELLS PRESENT.      01/27/2015 PET scan    Large hypermetabolic right hilar mass with mediastinal invasion and infrahilar extension, maximum standard uptake value 13.0.  Pancreatic tail mass, approximately 4.5 cm in diameter. Abdominal aortic aneurysm, 5.8 cm in diameter.      02/10/2015 Pathology Results    Diagnosis FINE NEEDLE ASPIRATION, ENDOSCOPIC, PANCREAS TAIL (SPECIMEN 1 OF 1 COLLECTED 02/10/15): MALIGNANT CELLS CONSISTENT WITH METASTATIC SMALL CELL CARCINOMA.      02/14/2015 - 03/06/2015 Chemotherapy    Cisplatin/Etoposide      03/06/2015 Treatment Plan Change    Change systemic therapy to Carboplatin based      03/07/2015 - 05/10/2015 Chemotherapy    Carboplatin/Etoposide.      04/13/2015 Imaging    resolution of pancreatic metastases, decrease in size of R perihilar mss and RUL mass. Interval response to therapy      05/10/2015 - 05/31/2015 Chemotherapy    Change to carboplatin/irinotecan for cycle #5 and #6 secondary to significant skin toxicity presumably from etoposide      07/12/2015 -  Radiation Therapy    25 Gy to whole brain and 30 Gy to R hilar region by Dr. Lisbeth Renshaw.      09/13/2015 Imaging    CT C/A/P unchanged from 2/28. persistent prominence  of peribronchovascular intersitium throughout R lung, in perihilar region, no definitive findings to suggest progression of disease on exam      11/03/2015 Imaging    MRI brain 3 mm area of restricted diffusion R posterior temporal lobe without abnormal enhancement favor acute infarct, mild dural thickening enhancement over R hemisphere now present previously      11/15/2015 Imaging    CT C/A/P Interval development of 1.6 x 1.4 cm L adrenal nodule suspicious for metastatic lesion. No other sites of new metastatic disease are noted  elsewhere in C/A/P. Evolving postradiation changes in R lung      12/01/2015 - 12/14/2015 Radiation Therapy    Radiation therapy to L adrenal nodule      01/18/2016 Imaging    CT C/A/P 1. Stable appearance of the chest. Specifically, there are post XRT findings in the right lung with perihilar and hilar airway thickening, some upper lobe airway plugging, and scattered densities which merit observation but which are likely therapy rib related. 2. Centrilobular emphysema  3. Coronary, aortic arch, and branch vessel atherosclerotic vascular disease 4. On prior PET-CT from 01/27/2015 there was a pancreatic body mass. Prior biopsy from 02/10/2015 showed this to be consistent with metastatic small cell carcinoma. This mass is not visible on today's CT scan, and has presumably resolved with treatment. There is a transition from a somewhat atrophic pancreatic tail to a normal caliber pancreatic body occurring in the vicinity of the prior mass. 5. Reduced size of a small left adrenal mass, currently 1.2 by 1.7 cm 6. Infrarenal abdominal aortic aneurysm up to 5.8 cm diameter, with patent aorta bi-iliac stent graft in place and no evidence of endoleak 7. Lumbar spondylosis and degenerative disc disease with mild impingement at L3-4 and L4-5      02/02/2016 Imaging    No evidence of metastatic disease to the brain.Resolution of small right hemispheric fluid collection. No leptomeningeal enhancement identified on today's study. This finding may have been due to a small subdural hematoma previously which has resolved.Negative for acute infarct. Small area of restricted diffusion in the right posterior temporal lobe on the prior study likely was an area of acute infarct which has resolved.      04/13/2016 Imaging    CT CAP- 1. Relatively similar appearance of areas of right-sided radiation change. No well-defined residual or recurrent disease identified. 2. No thoracic adenopathy. 3. No evidence of  metastatic disease in the abdomen or pelvis.      07/10/2016 Imaging    MRI brain- 1. No evidence of intracranial metastatic disease within limitations of mild motion artifact. 2. Unchanged subcentimeter lesion in the clivus. Small osseous metastasis not excluded.      07/20/2016 Imaging    CT CAP- 9.5 x 4.6 cm central right upper lobe mass extending to the perihilar region, new, compatible with lung cancer recurrence. Associated subpleural nodularity in the right upper lobe, suspicious for metastatic disease.  Associated mild mediastinal lymphadenopathy, compatible with nodal metastases.  Multifocal hepatic lesions in both lobes, new, compatible with hepatic metastases, with index lesions as above.  Additional ancillary findings as above.      07/20/2016 Relapse/Recurrence    Last Treatment Date: 12/14/15 XRT; 06/10/15 Chemotherapy Recent Lab Values: Recent Lab Values:  N/A      08/23/2016 Imaging    c/o RLE pain. Doppler ultrasound done and referred to vascular surgeon: IMPRESSION: No evidence of DVT with the right lower extremity. 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 Arterial Doppler or CT angiogram is recommended for further evaluation.       HISTORY OF PRESENTING ILLNESS:  [name] 56 y.o. male is here for further up of extensive stage SCLC. He is accompanied by family today.  Mr. Zelek presents today for continuing follow up.  He has been having right leg claudication.  A CTA aorto-bifemoral was ordered which demonstrated 1. Aorto bi-iliac stent graft with interval finding of occlusion of the right iliac limb of the stent. Mild aneurysmal dilatation of the distal right SFA with near occlusive thrombus present in the distal ossific. Severe narrowing or possible occlusion of the proximal popliteal artery with aneurysmal dilatation of the right popliteal artery which is also occluded. There is occlusion of the proximal  calf arteries with string like flow visualized in the posterior tibial and peroneal arteries from about the mid calf to just above the ankle. 2. Mild aneurysmal dilatation of the left common femoral artery. Mild aneurysmal dilatation of the distal left superficial femoral artery. Mild to moderate diffuse disease of the left SFA and popliteal vessels. Single vessel runoff on the left via the posterior tibial artery.   Patient has gone to see vascular surgery who stated that they will hold off on any intervention at this time until he has completed his chemotherapy.   INTERVAL HISTORY: Patient presents today with his wife for follow up. He states that his cough is getting worse and he starts coughing anytime he lays down flat.  He sometimes has a productive cough but its with clear sputum. He states his SOB is at baseline. He's received 2 cycles of carbo/irinotecan to date and has tolerated it very well. He denies any chest pain, appetite change, weight loss, abdominal pain.  MEDICAL HISTORY:  Past Medical History:  Diagnosis Date  . AAA (abdominal aortic aneurysm) ([place])   . Arthritis   . COPD (chronic obstructive pulmonary disease) ([place])   . Coronary artery disease 2002   Lesion in LAD s/p angioplasty; s/p CABG x 4 Sept 2013  . Essential hypertension, benign   . Hyperlipidemia   . Myocardial infarct ([place]) 1999, 2002  . Pancreatitis   . Small cell lung cancer ([place])    Stage IIIB with extension the pancreas    SURGICAL HISTORY: Past Surgical History:  Procedure Laterality Date  . ABDOMINAL AORTIC ENDOVASCULAR STENT GRAFT N/A 08/11/2015   Procedure: ABDOMINAL AORTIC ENDOVASCULAR STENT GRAFT;  Surgeon: Serafina Mitchell, MD;  Location: [facility] OR;  Service: Vascular;  Laterality: N/A;  . [place], 2002  . CORONARY ARTERY BYPASS GRAFT  01/02/2012   Procedure: CORONARY ARTERY BYPASS GRAFTING (CABG);  Surgeon: [name], MD;  Location: [facility];  Service: Open Heart Surgery;   Laterality: N/A;  times three using left internal mammary artery and right endoscopically harvested saphenous vein  . EUS N/A 02/10/2015   Procedure: UPPER ENDOSCOPIC ULTRASOUND (EUS) LINEAR;  Surgeon: [name], MD;  Location: [facility] ENDOSCOPY;  Service: Endoscopy;  Laterality: N/A;  . KNEE SURGERY Left    ACL repair  . LEFT HEART CATHETERIZATION WITH CORONARY ANGIOGRAM N/A 01/01/2012   Procedure: LEFT HEART CATHETERIZATION WITH CORONARY ANGIOGRAM;  Surgeon: Hillary Bow, MD;  Location: [facility] CATH LAB;  Service: Cardiovascular;  Laterality: N/A;  . LUNG BIOPSY N/A 01/26/2015   Procedure: LUNG BIOPSY;  Surgeon: [name], MD;  Location: [facility];  Service: Thoracic;  Laterality: N/A;  . LYMPH NODE BIOPSY N/A 01/26/2015  Procedure: LYMPH NODE BIOPSY;  Surgeon: [name], MD;  Location: [facility];  Service: Thoracic;  Laterality: N/A;  . MULTIPLE EXTRACTIONS WITH ALVEOLOPLASTY  02/21/2012   Procedure: MULTIPLE EXTRACION WITH ALVEOLOPLASTY;  Surgeon: Lenn Cal, DDS;  Location: [facility] ORS;  Service: Oral Surgery;  Laterality: N/A;  Extaction of tooth #'s 4,5,6,7,10,11,12,13,14,15,19,20,21,22,23,24,25,   . PORTACATH PLACEMENT Left 02/01/2015   Procedure: ATTEMPTED INSERTION OF PORT-A-CATH;  Surgeon: [name], MD;  Location: [facility];  Service: Thoracic;  Laterality: Left;  . TEE WITHOUT CARDIOVERSION  01/02/2012   Procedure: TRANSESOPHAGEAL ECHOCARDIOGRAM (TEE);  Surgeon: [name], MD;  Location: [facility];  Service: Open Heart Surgery;  Laterality: N/A;  . VIDEO BRONCHOSCOPY WITH ENDOBRONCHIAL ULTRASOUND N/A 01/26/2015   Procedure: VIDEO BRONCHOSCOPY WITH ENDOBRONCHIAL ULTRASOUND;  Surgeon: [name], MD;  Location: [facility];  Service: Thoracic;  Laterality: N/A;    SOCIAL HISTORY: Social History   Social History  . Marital status: Married    Spouse name: N/A  . Number of children: 5  . Years of education: N/A   Occupational History  . Works in [place]  . Smoking status: Current Every Day Smoker    Packs/day: 2.00    Years: 35.00    Types: Cigarettes    Start date: 06/30/1976  . Smokeless tobacco: Never Used  . Alcohol use No     Comment: Maybe a 12 pack/mth if that - very rare since 2013  . Drug use: No  . Sexual activity: Yes   Other Topics Concern  . Not on file   Social History Narrative  . No narrative on file   They've been married 17 years, with five kids.  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 Heart disease Father   . Diabetes Father   . Hyperlipidemia Father   . Hypertension Father   . Hyperlipidemia Mother   . Hypertension Mother   . Stroke Mother   . AAA (abdominal aortic aneurysm) Mother   . Diabetes Paternal Grandmother   . Cancer Neg Hx    indicated that his mother is alive. He indicated that his father is alive. He indicated that his sister is alive. He indicated that his brother is alive. He indicated that his maternal grandmother is deceased. He indicated that his maternal grandfather is deceased. He indicated that his paternal grandmother is deceased. He indicated that his paternal grandfather is deceased. He indicated that the status of his neg hx is unknown.    His father's somewhere in [place]; his mother lives here in [place] and works at [employer] 76 at the end of this month She's got high blood pressure, high cholesterol, no strokes that they know of; is on blood thinners Has an aneurism at the base of the aorta where it goes into your stomach He knows his father's had high blood pressure and  diabetes; Parents have been divorced since he was 3-4 Has half brothers / half sisters   ALLERGIES:  is allergic to lisinopril and tea.  MEDICATIONS:  Current Outpatient Prescriptions  Medication Sig Dispense Refill  . amLODipine (NORVASC) 5 MG tablet TAKE ONE (1) TABLET BY MOUTH EVERY DAY 90 tablet 3  . aspirin EC 81 MG tablet Take 81 mg by mouth daily.    Marland Kitchen CARBOPLATIN IV Inject into the vein. Day 1 every 28 days    . carvedilol (COREG) 6.25 MG tablet TAKE ONE TABLET BY MOUTH TWICE A DAY WITH A MEAL 180 tablet 1  . dexamethasone (DECADRON) 4 MG tablet Take 2 tablets (8 mg total) by mouth daily. Start the day after chemo for 2 days. 8 tablet 5  . IRINOTECAN HCL IV Inject into the vein. Day 1, day 8, day 15 every 28 days    . ondansetron (ZOFRAN) 8 MG tablet Take 1 tablet (8 mg total) by mouth 2 (two) times daily as needed for refractory nausea / vomiting. Start on day 3 after chemotherapy. 30 tablet 1  . pravastatin (PRAVACHOL) 40 MG tablet TAKE ONE (1) TABLET BY MOUTH EVERY DAY 90 tablet 2  . prochlorperazine (COMPAZINE) 10 MG tablet Take 1 tablet (10 mg total) by mouth every 6 (six) hours as needed (NAUSEA). 30 tablet 1   No current facility-administered medications for this visit.     Review of Systems  Constitutional: Negative.  Negative for chills and fever.  HENT: Negative.  Negative for hearing loss, sore throat and tinnitus.   Eyes: Negative.  Negative for blurred vision, photophobia and discharge.  Respiratory: Positive for cough. Negative for hemoptysis, sputum production, shortness of breath and wheezing.   Cardiovascular: Negative.  Negative for chest pain, palpitations, orthopnea, claudication and leg swelling.  Gastrointestinal: Negative.  Negative for abdominal pain, constipation, diarrhea, melena, nausea and vomiting.  Genitourinary: Negative.  Negative for dysuria and hematuria.  Musculoskeletal: Negative for back pain, joint pain and myalgias.  Skin: Negative.   Negative for itching and rash.  Neurological: Negative.  Negative for dizziness, weakness and headaches.  Endo/Heme/Allergies: Negative.  Negative for environmental allergies and polydipsia. Does not bruise/bleed easily.  Psychiatric/Behavioral: Negative for depression. The patient is not nervous/anxious and does not have insomnia.   All other systems reviewed and are negative. 14 point ROS was done and is otherwise as detailed above or in HPI   PHYSICAL EXAMINATION: ECOG PERFORMANCE STATUS: 0 - Asymptomatic    Physical Exam  Constitutional: He is oriented to person, place, and time.  HENT:  Head: Normocephalic and atraumatic.  Nose: Nose normal.  Eyes: Conjunctivae and EOM are normal. Pupils are equal, round, and reactive to light.  Neck: Normal range of motion. Neck supple.  Cardiovascular: Normal rate, regular rhythm and normal heart sounds.  Exam reveals no gallop and no friction rub.   No murmur heard. Pulmonary/Chest: Effort normal. No respiratory distress. He has no wheezes. He has no rales. He exhibits no tenderness.  Abdominal: Soft. Bowel sounds are normal. He exhibits no distension and no mass. There is no tenderness. There is no rebound and no guarding.  Musculoskeletal: Normal range of motion.  Neurological: He is alert and oriented to person, place, and time. He has normal reflexes.  Skin: Skin is warm and dry.  Psychiatric: Judgment normal.  Nursing note and vitals reviewed.   LABORATORY DATA:  I have reviewed the data as listed.  CBC    Component Value Date/Time   WBC 4.2 09/18/2016 0947   RBC 3.66 (L) 09/18/2016 0947   HGB 11.1 (L) 09/18/2016 0947   HCT 32.8 (L) 09/18/2016 7353  PLT 143 (L) 09/18/2016 0947   MCV 89.6 09/18/2016 0947   MCH 30.3 09/18/2016 0947   MCHC 33.8 09/18/2016 0947   RDW 18.0 (H) 09/18/2016 0947   LYMPHSABS 0.9 09/18/2016 0947   MONOABS 0.5 09/18/2016 0947   EOSABS 0.1 09/18/2016 0947   BASOSABS 0.0 09/18/2016 0947   CMP      Component Value Date/Time   NA 134 (L) 09/18/2016 0947   K 4.0 09/18/2016 0947   CL 96 (L) 09/18/2016 0947   CO2 29 09/18/2016 0947   GLUCOSE 133 (H) 09/18/2016 0947   BUN 11 09/18/2016 0947   CREATININE 1.25 (H) 09/18/2016 0947   CREATININE 0.99 01/27/2015 1450   CALCIUM 9.1 09/18/2016 0947   PROT 7.6 09/18/2016 0947   ALBUMIN 4.0 09/18/2016 0947   AST 18 09/18/2016 0947   ALT 14 (L) 09/18/2016 0947   ALKPHOS 51 09/18/2016 0947   BILITOT 0.8 09/18/2016 0947   GFRNONAA >60 09/18/2016 0947   GFRNONAA 87 01/27/2015 1450   GFRAA >60 09/18/2016 0947   GFRAA >89 01/27/2015 1450    RADIOGRAPHIC STUDIES: I have personally reviewed the radiological images as listed and agreed with the findings in the report. Study Result   CLINICAL DATA:  Restaging of extensive stage small cell lung cancer with pancreatic mass. Status post chemotherapy. COPD. Endovascular stent graft. Right-sided primary.  EXAM: CT CHEST, ABDOMEN, AND PELVIS WITH CONTRAST  TECHNIQUE: Multidetector CT imaging of the chest, abdomen and pelvis was performed following the standard protocol during bolus administration of intravenous contrast.  CONTRAST:  121mL ISOVUE-300 IOPAMIDOL (ISOVUE-300) INJECTION 61%  COMPARISON:  01/18/2016  FINDINGS: CT CHEST FINDINGS  Cardiovascular: Prior median sternotomy. Aortic and branch vessel atherosclerosis. Tortuous thoracic aorta. Normal heart size, without pericardial effusion. Pulmonary artery enlargement, 3.4 cm outflow tract. No central pulmonary embolism, on this non-dedicated study.  Mediastinum/Nodes: No supraclavicular adenopathy. No mediastinal adenopathy. Right perihilar soft tissue thickening is unchanged, without well-defined adenopathy.  Lungs/Pleura: No pleural fluid. Right upper lobe bronchial compression anteriorly is similar. Moderate bronchial wall thickening. Moderate centrilobular and paraseptal emphysema.  Interstitial thickening within  the peribronchovascular inferior right upper lobe is similar, including image 57/series 4. There is also subpleural reticulation in the more peripheral posterior right upper lobe and superior segment right lower lobe. No well-defined residual or recurrent mass. Anterior right upper lobe subpleural linear opacities are likely radiation induced and new. Thickening of the right major fissure is felt to be similar, including on image 63/series 4.  Musculoskeletal: No acute osseous abnormality.  CT ABDOMEN PELVIS FINDINGS  Hepatobiliary: Normal liver. Normal gallbladder, without biliary ductal dilatation.  Pancreas: No residual or recurrent pancreatic mass. Atrophy involving the tail is again identified.  Spleen: Normal in size, without focal abnormality.  Adrenals/Urinary Tract: Normal adrenal glands. Normal kidneys, without hydronephrosis. Normal urinary bladder.  This  Stomach/Bowel: Normal stomach, without wall thickening. Normal colon, appendix, and terminal ileum. Normal small bowel.  Vascular/Lymphatic: Aortic and branch vessel atherosclerosis. Status post endograft repair. Native sac measures on the order of 5.0 by 4.6 cm versus 5.5 x 5.8 on the prior. No endoleak. Patent iliac volumes. There is persistent dilatation of proximal internal iliac arteries including at 1.5 cm on the right, similar. No abdominopelvic adenopathy.  Reproductive: Normal prostate.  Other: No significant free fluid. No evidence of omental or peritoneal disease.  Musculoskeletal: No acute osseous abnormality. L3-4 and L4-5 disc bulges.  IMPRESSION: 1. Relatively similar appearance of areas of right-sided radiation change. No well-defined residual  or recurrent disease identified. 2. No thoracic adenopathy. 3. No evidence of metastatic disease in the abdomen or pelvis. 4.  Aortic atherosclerosis. 5. Status post aortic endograft repair, with decreased size native sac. Persistent  dilatation of the proximal internal iliac arteries bilaterally. 6. Pulmonary artery enlargement suggests pulmonary arterial hypertension.   Electronically Signed   By: Abigail Miyamoto M.D.   On: 04/13/2016 13:46   Right LOWER EXTREMITY VENOUS DOPPLER ULTRASOUND 08/23/2016  IMPRESSION: No evidence of DVT with the right lower extremity. 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 Arterial Doppler or CT angiogram is recommended for further evaluation. These results will be called to the ordering clinician or representative by the Radiologist Assistant, and communication documented in the PACS or zVision Dashboard.   CT ANGIOGRAPHY OF ABDOMINAL AORTA WITH ILIOFEMORAL RUNOFF 08/23/2016  IMPRESSION: VASCULAR  1. Aorto bi-iliac stent graft with interval finding of occlusion of the right iliac limb of the stent. Mild aneurysmal dilatation of the distal right SFA with near occlusive thrombus present in the distal ossific. Severe narrowing or possible occlusion of the proximal popliteal artery with aneurysmal dilatation of the right popliteal artery which is also occluded. There is occlusion of the proximal calf arteries with string like flow visualized in the posterior tibial and peroneal arteries from about the mid calf to just above the ankle. 2. Mild aneurysmal dilatation of the left common femoral artery. Mild aneurysmal dilatation of the distal left superficial femoral artery. Mild to moderate diffuse disease of the left SFA and popliteal vessels. Single vessel runoff on the left via the posterior tibial artery  NON-VASCULAR  Partially visualized right hilar and peribronchial soft tissue mass. Hepatic metastatic disease as previously demonstrated. There are no acute intra-abdominal or pelvic abnormalities visualized.  Critical Value/emergent results were called by telephone at the time of interpretation on 08/23/2016 at 9:54 pm  to Dr. Milton Ferguson , who verbally acknowledged these results.    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 Large hypermetabolic right hilar mass with mediastinal invasion and infrahilar extension  Hyponatremia c/w SIADH  EUS with Dr. Ardis Hughs and pancreatic biopsy on 02/10/2015 c/w SCLC Anemia secondary to chemotherapy CT C/A/P 04/13/2015 with interval response to therapy. Presumed drug reaction to etoposide Treatment related anemia CR to first line therapy R calf pain ABNL MRI brain 11/03/2015 L adrenal nodule   PLAN: -Patient has completed 2 cycles of carbo/irinotecan to date. 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 RTC on 10/01/16 for cycle 3 of chemo and to review his CT scan results. If he has progressive disease, I would place him on either topotecan or opdivo next.     All questions were answered. The patient knows to call the clinic with any problems, questions or concerns.     Twana First, MD  08/06/16    

## 2016-09-28 ENCOUNTER — Ambulatory Visit (HOSPITAL_COMMUNITY)
Admission: RE | Admit: 2016-09-28 | Discharge: 2016-09-28 | Disposition: A | Discharge status: Home / Self Care | Payer: Medicaid Other | Source: Ambulatory Visit | Attending: Oncology | Admitting: Oncology

## 2016-09-28 DIAGNOSIS — C787 Secondary malignant neoplasm of liver and intrahepatic bile duct: Secondary | ICD-10-CM | POA: Insufficient documentation

## 2016-09-28 DIAGNOSIS — C3490 Malignant neoplasm of unspecified part of unspecified bronchus or lung: Secondary | ICD-10-CM

## 2016-09-28 MED ORDER — IOPAMIDOL (ISOVUE-300) INJECTION 61%
100.0000 mL | Freq: Once | INTRAVENOUS | Status: AC | PRN
  Administered 2016-09-28: 100 mL via INTRAVENOUS

## 2016-10-01 ENCOUNTER — Encounter (HOSPITAL_COMMUNITY): Payer: Medicaid Other

## 2016-10-01 ENCOUNTER — Encounter (HOSPITAL_COMMUNITY): Payer: Self-pay | Admitting: Oncology

## 2016-10-01 ENCOUNTER — Other Ambulatory Visit (HOSPITAL_COMMUNITY): Payer: Self-pay | Admitting: Pharmacist

## 2016-10-01 ENCOUNTER — Other Ambulatory Visit (HOSPITAL_COMMUNITY): Payer: Self-pay | Admitting: Oncology

## 2016-10-01 ENCOUNTER — Encounter (HOSPITAL_BASED_OUTPATIENT_CLINIC_OR_DEPARTMENT_OTHER): Payer: Medicaid Other | Admitting: Oncology

## 2016-10-01 ENCOUNTER — Ambulatory Visit (HOSPITAL_COMMUNITY): Payer: Self-pay

## 2016-10-01 VITALS — BP 107/67 | HR 49 | Temp 98.2°F | Resp 18 | Wt 209.2 lb

## 2016-10-01 DIAGNOSIS — C3490 Malignant neoplasm of unspecified part of unspecified bronchus or lung: Secondary | ICD-10-CM

## 2016-10-01 DIAGNOSIS — R630 Anorexia: Secondary | ICD-10-CM

## 2016-10-01 DIAGNOSIS — K8689 Other specified diseases of pancreas: Secondary | ICD-10-CM

## 2016-10-01 DIAGNOSIS — K869 Disease of pancreas, unspecified: Secondary | ICD-10-CM

## 2016-10-01 DIAGNOSIS — D630 Anemia in neoplastic disease: Secondary | ICD-10-CM

## 2016-10-01 DIAGNOSIS — C787 Secondary malignant neoplasm of liver and intrahepatic bile duct: Secondary | ICD-10-CM

## 2016-10-01 DIAGNOSIS — Z720 Tobacco use: Secondary | ICD-10-CM

## 2016-10-01 DIAGNOSIS — E279 Disorder of adrenal gland, unspecified: Secondary | ICD-10-CM

## 2016-10-01 DIAGNOSIS — D6481 Anemia due to antineoplastic chemotherapy: Secondary | ICD-10-CM

## 2016-10-01 DIAGNOSIS — C3491 Malignant neoplasm of unspecified part of right bronchus or lung: Secondary | ICD-10-CM | POA: Diagnosis present

## 2016-10-01 DIAGNOSIS — C3411 Malignant neoplasm of upper lobe, right bronchus or lung: Secondary | ICD-10-CM

## 2016-10-01 LAB — CBC WITH DIFFERENTIAL/PLATELET
BASOS ABS: 0 10*3/uL (ref 0.0–0.1)
Basophils Relative: 0 %
EOS PCT: 5 %
Eosinophils Absolute: 0.2 10*3/uL (ref 0.0–0.7)
HEMATOCRIT: 31.5 % — AB (ref 39.0–52.0)
HEMOGLOBIN: 10.5 g/dL — AB (ref 13.0–17.0)
LYMPHS PCT: 21 %
Lymphs Abs: 0.8 10*3/uL (ref 0.7–4.0)
MCH: 30.6 pg (ref 26.0–34.0)
MCHC: 33.3 g/dL (ref 30.0–36.0)
MCV: 91.8 fL (ref 78.0–100.0)
Monocytes Absolute: 0.7 10*3/uL (ref 0.1–1.0)
Monocytes Relative: 17 %
NEUTROS ABS: 2.3 10*3/uL (ref 1.7–7.7)
NEUTROS PCT: 57 %
PLATELETS: 161 10*3/uL (ref 150–400)
RBC: 3.43 MIL/uL — AB (ref 4.22–5.81)
RDW: 18.6 % — ABNORMAL HIGH (ref 11.5–15.5)
WBC: 4 10*3/uL (ref 4.0–10.5)

## 2016-10-01 LAB — COMPREHENSIVE METABOLIC PANEL
ALK PHOS: 48 U/L (ref 38–126)
ALT: 13 U/L — ABNORMAL LOW (ref 17–63)
AST: 18 U/L (ref 15–41)
Albumin: 3.8 g/dL (ref 3.5–5.0)
Anion gap: 8 (ref 5–15)
BUN: 11 mg/dL (ref 6–20)
CHLORIDE: 97 mmol/L — AB (ref 101–111)
CO2: 29 mmol/L (ref 22–32)
CREATININE: 1.15 mg/dL (ref 0.61–1.24)
Calcium: 8.7 mg/dL — ABNORMAL LOW (ref 8.9–10.3)
GFR calc Af Amer: >60 mL/min (ref >60)
Glucose, Bld: 105 mg/dL — ABNORMAL HIGH (ref 65–99)
Potassium: 4 mmol/L (ref 3.5–5.1)
Sodium: 134 mmol/L — ABNORMAL LOW (ref 135–145)
Total Bilirubin: 0.7 mg/dL (ref 0.3–1.2)
Total Protein: 7.3 g/dL (ref 6.5–8.1)

## 2016-10-01 MED ORDER — ATROPINE SULFATE 1 MG/ML IJ SOLN
0.5000 mg | Freq: Once | INTRAMUSCULAR | Status: DC | PRN
  Filled 2016-10-01: qty 1

## 2016-10-01 MED ORDER — DEXAMETHASONE SODIUM PHOSPHATE 10 MG/ML IJ SOLN
10.0000 mg | Freq: Once | INTRAMUSCULAR | Status: DC
  Filled 2016-10-01: qty 1

## 2016-10-01 MED ORDER — PALONOSETRON HCL INJECTION 0.25 MG/5ML
0.2500 mg | Freq: Once | INTRAVENOUS | Status: DC
  Filled 2016-10-01: qty 5

## 2016-10-01 MED ORDER — SODIUM CHLORIDE 0.9 % IV SOLN
600.0000 mg | Freq: Once | INTRAVENOUS | Status: DC
  Filled 2016-10-01: qty 60

## 2016-10-01 MED ORDER — SODIUM CHLORIDE 0.9 % IV SOLN: Freq: Once | INTRAVENOUS | Status: DC

## 2016-10-01 MED ORDER — DEXTROSE 5 % IV SOLN
60.0000 mg/m2 | Freq: Once | INTRAVENOUS | Status: DC
  Filled 2016-10-01: qty 6

## 2016-10-01 NOTE — Progress Notes (Signed)
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       01/18/2015 Imaging    CT chest- 8.7 x 8.7 cm R hilar mass encasing the R pulm artery and occluding the R upper lobe bronchus. Sm R pleural effusion; malignant or reactive. 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 34 mm x 26 mm mass in body of pancreas      01/21/2015 Imaging    MRI brain- No acute intracranial findings. No signs of metastatic disease. Minor white matter disease, stable and nonspecific.      01/26/2015 Pathology Results    Lung, biopsy, right upper lobe - SMALL CELL CARCINOMA.      01/26/2015 Pathology Results    Diagnosis FINE NEEDLE ASPIRATION: ENDOSCOPIC SPECIMEN A, EBUS LEVEL 7 NODE (SPECIMEN 1 OF 3 COLLECTED 01/26/2015) MALIGNANT CELLS PRESENT, CONSISTENT WITH SMALL CELL CARCINOMA.      01/26/2015 Pathology Results    WANG NEEDLE ASPIRATION, SPECIMEN B LEVEL 7 NODE (SPECIMEN 2 OF 3 COLLECTED 01/26/2015) MALIGNANT  CELLS PRESENT, CONSISTENT WITH SMALL CELL CARCINOMA.      01/26/2015 Pathology Results    Diagnosis BRONCHIAL BRUSHING SPECIMEN C, RIGHT UPPER LOBE (SPECIMEN 3 OF 3 COLLECTED 01/26/2015) ATYPICAL CELLS PRESENT.      01/27/2015 PET scan    Large hypermetabolic right hilar mass with mediastinal invasion and infrahilar extension, maximum standard uptake value 13.0.  Pancreatic tail mass, approximately 4.5 cm in diameter. Abdominal aortic aneurysm, 5.8 cm in diameter.      02/10/2015 Pathology Results    Diagnosis FINE NEEDLE ASPIRATION, ENDOSCOPIC, PANCREAS TAIL (SPECIMEN 1 OF 1 COLLECTED 02/10/15): MALIGNANT CELLS CONSISTENT WITH METASTATIC SMALL CELL CARCINOMA.      02/14/2015 - 03/06/2015 Chemotherapy    Cisplatin/Etoposide      03/06/2015 Treatment Plan Change    Change systemic therapy to Carboplatin based      03/07/2015 - 05/10/2015 Chemotherapy    Carboplatin/Etoposide.      04/13/2015 Imaging    resolution of pancreatic metastases, decrease in size of R perihilar mss and RUL mass. Interval response to therapy      05/10/2015 - 05/31/2015 Chemotherapy    Change to carboplatin/irinotecan for cycle #5 and #6 secondary to significant skin toxicity presumably from etoposide      07/12/2015 -  Radiation Therapy    25 Gy to whole brain and 30 Gy to R hilar region by Dr. Lisbeth Renshaw.      09/13/2015 Imaging    CT C/A/P unchanged from 2/28. persistent prominence  of peribronchovascular intersitium throughout R lung, in perihilar region, no definitive findings to suggest progression of disease on exam      11/03/2015 Imaging    MRI brain 3 mm area of restricted diffusion R posterior temporal lobe without abnormal enhancement favor acute infarct, mild dural thickening enhancement over R hemisphere now present previously      11/15/2015 Imaging    CT C/A/P Interval development of 1.6 x 1.4 cm L adrenal nodule suspicious for metastatic lesion. No other sites of new metastatic disease are noted  elsewhere in C/A/P. Evolving postradiation changes in R lung      12/01/2015 - 12/14/2015 Radiation Therapy    Radiation therapy to L adrenal nodule      01/18/2016 Imaging    CT C/A/P 1. Stable appearance of the chest. Specifically, there are post XRT findings in the right lung with perihilar and hilar airway thickening, some upper lobe airway plugging, and scattered densities which merit observation but which are likely therapy rib related. 2. Centrilobular emphysema  3. Coronary, aortic arch, and branch vessel atherosclerotic vascular disease 4. On prior PET-CT from 01/27/2015 there was a pancreatic body mass. Prior biopsy from 02/10/2015 showed this to be consistent with metastatic small cell carcinoma. This mass is not visible on today's CT scan, and has presumably resolved with treatment. There is a transition from a somewhat atrophic pancreatic tail to a normal caliber pancreatic body occurring in the vicinity of the prior mass. 5. Reduced size of a small left adrenal mass, currently 1.2 by 1.7 cm 6. Infrarenal abdominal aortic aneurysm up to 5.8 cm diameter, with patent aorta bi-iliac stent graft in place and no evidence of endoleak 7. Lumbar spondylosis and degenerative disc disease with mild impingement at L3-4 and L4-5      02/02/2016 Imaging    No evidence of metastatic disease to the brain.Resolution of small right hemispheric fluid collection. No leptomeningeal enhancement identified on today's study. This finding may have been due to a small subdural hematoma previously which has resolved.Negative for acute infarct. Small area of restricted diffusion in the right posterior temporal lobe on the prior study likely was an area of acute infarct which has resolved.      04/13/2016 Imaging    CT CAP- 1. Relatively similar appearance of areas of right-sided radiation change. No well-defined residual or recurrent disease identified. 2. No thoracic adenopathy. 3. No evidence of  metastatic disease in the abdomen or pelvis.      07/10/2016 Imaging    MRI brain- 1. No evidence of intracranial metastatic disease within limitations of mild motion artifact. 2. Unchanged subcentimeter lesion in the clivus. Small osseous metastasis not excluded.      07/20/2016 Imaging    CT CAP- 9.5 x 4.6 cm central right upper lobe mass extending to the perihilar region, new, compatible with lung cancer recurrence. Associated subpleural nodularity in the right upper lobe, suspicious for metastatic disease.  Associated mild mediastinal lymphadenopathy, compatible with nodal metastases.  Multifocal hepatic lesions in both lobes, new, compatible with hepatic metastases, with index lesions as above.  Additional ancillary findings as above.      07/20/2016 Relapse/Recurrence    Last Treatment Date: 12/14/15 XRT; 06/10/15 Chemotherapy Recent Lab Values: Recent Lab Values:  N/A      08/23/2016 Imaging    c/o RLE pain. Doppler ultrasound done and referred to vascular surgeon: IMPRESSION: No evidence of DVT with the right lower extremity. 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 Arterial Doppler or CT angiogram is recommended for further evaluation.       HISTORY OF PRESENTING ILLNESS:  [name] 56 y.o. male is here for further up of extensive stage SCLC. He is accompanied by family today.  Mr. Stoutenburg presents today for continuing follow up.  Patient was in place of his primary oncologist.  He had a repeat the scan which shows progressive liver metastases.  According to measurement of liver lesions, patient has significant progression off targeted lesion So chemotherapy has been put on hold  Appetite remains poor.Marland Kitchen  MEDICAL HISTORY:  Past Medical History:  Diagnosis Date  . AAA (abdominal aortic aneurysm) ([place])   . Arthritis   . COPD (chronic obstructive pulmonary disease) ([place])   . Coronary artery disease 2002    Lesion in LAD s/p angioplasty; s/p CABG x 4 Sept 2013  . Essential hypertension, benign   . Hyperlipidemia   . Myocardial infarct ([place]) 1999, 2002  . Pancreatitis   . Small cell lung cancer ([place])    Stage IIIB with extension the pancreas    SURGICAL HISTORY: Past Surgical History:  Procedure Laterality Date  . ABDOMINAL AORTIC ENDOVASCULAR STENT GRAFT N/A 08/11/2015   Procedure: ABDOMINAL AORTIC ENDOVASCULAR STENT GRAFT;  Surgeon: Serafina Mitchell, MD;  Location: [facility] OR;  Service: Vascular;  Laterality: N/A;  . [place], 2002  . CORONARY ARTERY BYPASS GRAFT  01/02/2012   Procedure: CORONARY ARTERY BYPASS GRAFTING (CABG);  Surgeon: [name], MD;  Location: [facility];  Service: Open Heart Surgery;  Laterality: N/A;  times three using left internal mammary artery and right endoscopically harvested saphenous vein  . EUS N/A 02/10/2015   Procedure: UPPER ENDOSCOPIC ULTRASOUND (EUS) LINEAR;  Surgeon: [name], MD;  Location: [facility] ENDOSCOPY;  Service: Endoscopy;  Laterality: N/A;  . KNEE SURGERY Left    ACL repair  . LEFT HEART CATHETERIZATION WITH CORONARY ANGIOGRAM N/A 01/01/2012   Procedure: LEFT HEART CATHETERIZATION WITH CORONARY ANGIOGRAM;  Surgeon: Hillary Bow, MD;  Location: [facility] CATH LAB;  Service: Cardiovascular;  Laterality: N/A;  . LUNG BIOPSY N/A 01/26/2015   Procedure: LUNG BIOPSY;  Surgeon: [name], MD;  Location: [facility];  Service: Thoracic;  Laterality: N/A;  . LYMPH NODE BIOPSY N/A 01/26/2015   Procedure: LYMPH NODE BIOPSY;  Surgeon: [name], MD;  Location: [facility];  Service: Thoracic;  Laterality: N/A;  . MULTIPLE EXTRACTIONS WITH ALVEOLOPLASTY  02/21/2012   Procedure: MULTIPLE EXTRACION WITH ALVEOLOPLASTY;  Surgeon: Lenn Cal, DDS;  Location: [facility] ORS;  Service: Oral Surgery;  Laterality: N/A;  Extaction of tooth #'s 4,5,6,7,10,11,12,13,14,15,19,20,21,22,23,24,25,   . PORTACATH PLACEMENT Left 02/01/2015   Procedure: ATTEMPTED  INSERTION OF PORT-A-CATH;  Surgeon: [name], MD;  Location: [facility];  Service: Thoracic;  Laterality: Left;  . TEE WITHOUT CARDIOVERSION  01/02/2012   Procedure: TRANSESOPHAGEAL ECHOCARDIOGRAM (TEE);  Surgeon: [name], MD;  Location: [facility];  Service: Open Heart Surgery;  Laterality: N/A;  . VIDEO BRONCHOSCOPY WITH ENDOBRONCHIAL ULTRASOUND N/A 01/26/2015   Procedure: VIDEO BRONCHOSCOPY WITH ENDOBRONCHIAL ULTRASOUND;  Surgeon: [name], MD;  Location: [facility];  Service: Thoracic;  Laterality: N/A;    SOCIAL HISTORY: Social History   Social History  . Marital status: Married    Spouse name: N/A  . Number of children: 5  . Years of education: N/A   Occupational History  . Works in [employer]  Main Topics  . Smoking status: Current Every Day Smoker    Packs/day: 2.00    Years: 35.00    Types: Cigarettes    Start date: 06/30/1976  . Smokeless tobacco: Never Used  . Alcohol use No     Comment: Maybe a 12 pack/mth if that - very rare since 2013  . Drug use: No  . Sexual activity: Yes   Other Topics Concern  . Not on file   Social History Narrative  . No narrative on file   They've been married 17 years, with five kids.  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 Heart disease Father   . Diabetes Father   . Hyperlipidemia Father   . Hypertension Father   . Hyperlipidemia Mother   . Hypertension Mother   . Stroke Mother   . AAA (abdominal aortic aneurysm) Mother   . Diabetes Paternal Grandmother   . Cancer Neg Hx    indicated that his mother is alive. He indicated that his father is alive. He  indicated that his sister is alive. He indicated that his brother is alive. He indicated that his maternal grandmother is deceased. He indicated that his maternal grandfather is deceased. He indicated that his paternal grandmother is deceased. He indicated that his paternal grandfather is deceased. He indicated that the status of his neg hx is unknown.    His father's somewhere in [place]; his mother lives here in [place] and works at [employer] 76 at the end of this month She's got high blood pressure, high cholesterol, no strokes that they know of; is on blood thinners Has an aneurism at the base of the aorta where it goes into your stomach He knows his father's had high blood pressure and diabetes; Parents have been divorced since he was 3-4 Has half brothers / half sisters   ALLERGIES:  is allergic to lisinopril and tea.  MEDICATIONS:  Current Outpatient Prescriptions  Medication Sig Dispense Refill  . amLODipine (NORVASC) 5 MG tablet TAKE ONE (1) TABLET BY MOUTH EVERY DAY 90 tablet 3  . aspirin EC 81 MG tablet Take 81 mg by mouth daily.    Marland Kitchen CARBOPLATIN IV Inject into the vein. Day 1 every 28 days    . carvedilol (COREG) 6.25 MG tablet TAKE ONE TABLET BY MOUTH TWICE A DAY WITH A MEAL 180 tablet 1  . dexamethasone (DECADRON) 4 MG tablet Take 2 tablets (8 mg total) by mouth daily. Start the day after chemo for 2 days. 8 tablet 5  . IRINOTECAN HCL IV Inject into the vein. Day 1, day 8, day 15 every 28 days    . ondansetron (ZOFRAN) 8 MG tablet Take 1 tablet (8 mg total) by mouth 2 (two) times daily as needed for refractory nausea / vomiting. Start on day 3 after chemotherapy. 30 tablet 1  . pravastatin (PRAVACHOL) 40 MG tablet TAKE ONE (1) TABLET BY MOUTH EVERY DAY 90 tablet 2  . prochlorperazine (COMPAZINE) 10 MG tablet Take 1 tablet (10 mg total) by mouth every 6 (six) hours as needed (NAUSEA). 30 tablet  1   No current facility-administered medications for this visit.     Facility-Administered Medications Ordered in Other Visits  Medication Dose Route Frequency Provider Last Rate Last Dose  . 0.9 %  sodium chloride infusion   Intravenous Once Twana First, MD      . atropine injection 0.5 mg  0.5 mg Intravenous Once PRN Twana First, MD      . CARBOplatin (PARAPLATIN) 600 mg in sodium chloride 0.9 % 250 mL chemo infusion  600 mg Intravenous Once Twana First, MD      . dexamethasone (DECADRON) injection 10 mg  10 mg Intravenous Once Twana First, MD      . irinotecan (CAMPTOSAR) 120 mg in dextrose 5 % 500 mL chemo infusion  60 mg/m2 (Treatment Plan Recorded) Intravenous Once Twana First, MD      . palonosetron (ALOXI) injection 0.25 mg  0.25 mg Intravenous Once Twana First, MD        Review of Systems  Constitutional: Negative.  Negative for chills and fever.  HENT: Negative.  Negative for hearing loss, sore throat and tinnitus.   Eyes: Negative.  Negative for blurred vision, photophobia and discharge.  Respiratory: Positive for cough. Negative for hemoptysis, sputum production, shortness of breath and wheezing.   Cardiovascular: Negative.  Negative for chest pain, palpitations, orthopnea, claudication and leg swelling.  Gastrointestinal: Negative.  Negative for abdominal pain, constipation, diarrhea, melena, nausea and vomiting.  Genitourinary: Negative.  Negative for dysuria and hematuria.  Musculoskeletal: Negative for back pain, joint pain and myalgias.  Skin: Negative.  Negative for itching and rash.  Neurological: Negative.  Negative for dizziness, weakness and headaches.  Endo/Heme/Allergies: Negative.  Negative for environmental allergies and polydipsia. Does not bruise/bleed easily.  Psychiatric/Behavioral: Negative for depression. The patient is not nervous/anxious and does not have insomnia.   All other systems reviewed and are negative. 14 point ROS was done and is otherwise as detailed above or in HPI   PHYSICAL EXAMINATION: ECOG  PERFORMANCE STATUS: 0 - Asymptomatic    Physical Exam  Constitutional: He is oriented to person, place, and time.  HENT:  Head: Normocephalic and atraumatic.  Nose: Nose normal.  Eyes: Conjunctivae and EOM are normal. Pupils are equal, round, and reactive to light.  Neck: Normal range of motion. Neck supple.  Cardiovascular: Normal rate, regular rhythm and normal heart sounds.  Exam reveals no gallop and no friction rub.   No murmur heard. Pulmonary/Chest: Effort normal. No respiratory distress. He has no wheezes. He has no rales. He exhibits no tenderness.  Abdominal: Soft. Bowel sounds are normal. He exhibits no distension and no mass. There is no tenderness. There is no rebound and no guarding.  Musculoskeletal: Normal range of motion.  Neurological: He is alert and oriented to person, place, and time. He has normal reflexes.  Skin: Skin is warm and dry.  Psychiatric: Judgment normal.  Nursing note and vitals reviewed.   LABORATORY DATA:  I have reviewed the data as listed.  CBC    Component Value Date/Time   WBC 4.0 10/01/2016 0941   RBC 3.43 (L) 10/01/2016 0941   HGB 10.5 (L) 10/01/2016 0941   HCT 31.5 (L) 10/01/2016 0941   PLT 161 10/01/2016 0941   MCV 91.8 10/01/2016 0941   MCH 30.6 10/01/2016 0941   MCHC 33.3 10/01/2016 0941   RDW 18.6 (H) 10/01/2016 0941   LYMPHSABS 0.8 10/01/2016 0941   MONOABS 0.7 10/01/2016 0941   EOSABS 0.2 10/01/2016 0941   BASOSABS  0.0 10/01/2016 0941   CMP     Component Value Date/Time   NA 134 (L) 10/01/2016 0941   K 4.0 10/01/2016 0941   CL 97 (L) 10/01/2016 0941   CO2 29 10/01/2016 0941   GLUCOSE 105 (H) 10/01/2016 0941   BUN 11 10/01/2016 0941   CREATININE 1.15 10/01/2016 0941   CREATININE 0.99 01/27/2015 1450   CALCIUM 8.7 (L) 10/01/2016 0941   PROT 7.3 10/01/2016 0941   ALBUMIN 3.8 10/01/2016 0941   AST 18 10/01/2016 0941   ALT 13 (L) 10/01/2016 0941   ALKPHOS 48 10/01/2016 0941   BILITOT 0.7 10/01/2016 0941   GFRNONAA  >60 10/01/2016 0941   GFRNONAA 87 01/27/2015 1450   GFRAA >60 10/01/2016 0941   GFRAA >89 01/27/2015 1450    RADIOGRAPHIC STUDIES: I have personally reviewed the radiological images as listed and agreed with the findings in the report. Study Result   CLINICAL DATA:  Restaging of extensive stage small cell lung cancer with pancreatic mass. Status post chemotherapy. COPD. Endovascular stent graft. Right-sided primary.  EXAM: CT CHEST, ABDOMEN, AND PELVIS WITH CONTRAST  TECHNIQUE: Multidetector CT imaging of the chest, abdomen and pelvis was performed following the standard protocol during bolus administration of intravenous contrast.  CONTRAST:  165mL ISOVUE-300 IOPAMIDOL (ISOVUE-300) INJECTION 61%  COMPARISON:  01/18/2016  FINDINGS: CT CHEST FINDINGS  Cardiovascular: Prior median sternotomy. Aortic and branch vessel atherosclerosis. Tortuous thoracic aorta. Normal heart size, without pericardial effusion. Pulmonary artery enlargement, 3.4 cm outflow tract. No central pulmonary embolism, on this non-dedicated study.  Mediastinum/Nodes: No supraclavicular adenopathy. No mediastinal adenopathy. Right perihilar soft tissue thickening is unchanged, without well-defined adenopathy.  Lungs/Pleura: No pleural fluid. Right upper lobe bronchial compression anteriorly is similar. Moderate bronchial wall thickening. Moderate centrilobular and paraseptal emphysema.  Interstitial thickening within the peribronchovascular inferior right upper lobe is similar, including image 57/series 4. There is also subpleural reticulation in the more peripheral posterior right upper lobe and superior segment right lower lobe. No well-defined residual or recurrent mass. Anterior right upper lobe subpleural linear opacities are likely radiation induced and new. Thickening of the right major fissure is felt to be similar, including on image 63/series 4.  Musculoskeletal: No acute osseous  abnormality.  CT ABDOMEN PELVIS FINDINGS  Hepatobiliary: Normal liver. Normal gallbladder, without biliary ductal dilatation.  Pancreas: No residual or recurrent pancreatic mass. Atrophy involving the tail is again identified.  Spleen: Normal in size, without focal abnormality.  Adrenals/Urinary Tract: Normal adrenal glands. Normal kidneys, without hydronephrosis. Normal urinary bladder.  This  Stomach/Bowel: Normal stomach, without wall thickening. Normal colon, appendix, and terminal ileum. Normal small bowel.  Vascular/Lymphatic: Aortic and branch vessel atherosclerosis. Status post endograft repair. Native sac measures on the order of 5.0 by 4.6 cm versus 5.5 x 5.8 on the prior. No endoleak. Patent iliac volumes. There is persistent dilatation of proximal internal iliac arteries including at 1.5 cm on the right, similar. No abdominopelvic adenopathy.  Reproductive: Normal prostate.  Other: No significant free fluid. No evidence of omental or peritoneal disease.  Musculoskeletal: No acute osseous abnormality. L3-4 and L4-5 disc bulges.  IMPRESSION: 1. Relatively similar appearance of areas of right-sided radiation change. No well-defined residual or recurrent disease identified. 2. No thoracic adenopathy. 3. No evidence of metastatic disease in the abdomen or pelvis. 4.  Aortic atherosclerosis. 5. Status post aortic endograft repair, with decreased size native sac. Persistent dilatation of the proximal internal iliac arteries bilaterally. 6. Pulmonary artery enlargement suggests pulmonary arterial hypertension.  Electronically Signed   By: Abigail Miyamoto M.D.   On: 04/13/2016 13:46   Right LOWER EXTREMITY VENOUS DOPPLER ULTRASOUND 08/23/2016  IMPRESSION: No evidence of DVT with the right lower extremity. 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 Arterial Doppler or CT angiogram is recommended for  further evaluation. These results will be called to the ordering clinician or representative by the Radiologist Assistant, and communication documented in the PACS or zVision Dashboard.   CT ANGIOGRAPHY OF ABDOMINAL AORTA WITH ILIOFEMORAL RUNOFF 08/23/2016  IMPRESSION: VASCULAR  1. Aorto bi-iliac stent graft with interval finding of occlusion of the right iliac limb of the stent. Mild aneurysmal dilatation of the distal right SFA with near occlusive thrombus present in the distal ossific. Severe narrowing or possible occlusion of the proximal popliteal artery with aneurysmal dilatation of the right popliteal artery which is also occluded. There is occlusion of the proximal calf arteries with string like flow visualized in the posterior tibial and peroneal arteries from about the mid calf to just above the ankle. 2. Mild aneurysmal dilatation of the left common femoral artery. Mild aneurysmal dilatation of the distal left superficial femoral artery. Mild to moderate diffuse disease of the left SFA and popliteal vessels. Single vessel runoff on the left via the posterior tibial artery  NON-VASCULAR  Partially visualized right hilar and peribronchial soft tissue mass. Hepatic metastatic disease as previously demonstrated. There are no acute intra-abdominal or pelvic abnormalities visualized.  Critical Value/emergent results were called by telephone at the time of interpretation on 08/23/2016 at 9:54 pm to Dr. Milton Ferguson , who verbally acknowledged these results.    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 Large hypermetabolic right hilar mass with mediastinal invasion and infrahilar extension  Hyponatremia c/w SIADH  EUS with Dr. Ardis Hughs and pancreatic biopsy on 02/10/2015 c/w SCLC Anemia secondary to chemotherapy CT C/A/P 04/13/2015 with interval response to therapy. Presumed drug reaction to etoposide Treatment related anemia CR to first line therapy R calf pain ABNL MRI brain 11/03/2015 L adrenal nodule   PLAN: CT scan has been reviewed shows clear-cut progressive disease with liver metastases  I explained to the patient possibility of NIVOLULAMAB and be considered however patient does not have any insurance and will have to refer patient to social services department  Chemotherapy will be put on hold Next appointment will be made after chemotherapy plan is approved  CT scan has been reviewed   independently      has been explained regarding progressive disease and all the questions were answered  All questions were answered. The patient knows to call the clinic with any problems, questions or concerns.  Time spent discussing different plan of treatment and and the social issues were more than 30 minutes

## 2016-10-01 NOTE — Progress Notes (Signed)
Per Dr. Oliva Bustard, therapy will be changed due to disease progression.  No chemotherapy today per MD.

## 2016-10-02 ENCOUNTER — Encounter: Payer: Self-pay | Admitting: *Deleted

## 2016-10-02 ENCOUNTER — Other Ambulatory Visit (HOSPITAL_COMMUNITY): Payer: Self-pay | Admitting: Pharmacist

## 2016-10-02 NOTE — Progress Notes (Signed)
[facility] Clinical Social Work  Clinical Social Work was referred by Art therapist for assessment of psychosocial needs due to issues with insurance. Clinical Social Worker contacted patient and wife at home to offer support and assess for needs.  CSW spoke with wife at length and she is very aware of insurance concerns. Pt had medicaid, but has started to receive his SSD payments and now is over the income limit and now has a $9,000 deductible in order to restart to medicaid. CSW reviewed additional resources for assistance. She plans to seek assistance through [facility] [place] and [place] for help with food and gas. Wife reports can re-add him to her [place] and plans to also go back to DSS to try to re-explore medicaid coverage. CSW will continue to follow and assist. Wife will explore resources and reach out for assistance if needed.     Clinical Social Work interventions: Resource education and referral  Loren Racer, LCSW, OSW-[facility]   [phone]

## 2016-10-03 NOTE — Patient Instructions (Signed)
[name]   CHEMOTHERAPY INSTRUCTIONS  You have Stage Small Cell Lung Cancer.  You have had some progression therefore we are going to treat you with an immunotherapy called opdivo and yervoy.  They are given every 3 weeks.  There are no premedications given prior to administering this medication.   This treatment is given with palliative intent, which mean that you are treatable but not curable.  You will stay on this treatment until you have progression or you can no longer tolerate the drug.   You will see the doctor regularly throughout treatment.  We monitor your lab work prior to every treatment.  The doctor monitors your response to treatment by the way you are feeling, your blood work, and scans periodically.   POTENTIAL SIDE EFFECTS OF TREATMENT:  Nivolumab (Opdivo)  About This Drug Nivolumab is used to treat cancer. It is given in the vein (IV).  Possible Side Effects . Bone marrow depression. This is a decrease in the number of white blood cells, red blood cells, and platelets. This may raise your risk of infection, make you tired and weak (fatigue), and raise your risk of bleeding. . Tiredness and weakness . Joint, muscle and bone pain . Back pain . Loose bowel movements (diarrhea) . Nausea . Decreased appetite (decreased hunger) . Constipation (not able to move bowels) . Cough and trouble breathing . Upper respiratory infection . Fever . Rash and itching . Electrolyte changes Note: Each of the side effects above was reported in 20% or greater of patients treated with nivolumab. Not all possible side effects are included above. Your side effects may be different or more severe if you receive nivolumab in combination with other chemotherapy agents.  Warnings and Precautions . This drug works with your immune system and can cause inflammation in any of your organs and tissues and can change how they work. This may put you at risk for  developing serious medical problems which can very rarely be fatal. . Colitis (swelling (inflammation) in the colon) - symptoms are loose bowel movements (diarrhea) stomach cramping, and sometimes blood in the bowel movements . Changes in liver function . Changes in kidney function . Inflammation (swelling) of the lungs which can very rarely be fatal - you may have a dry cough or trouble breathing. . This drug may affect some of your hormone glands (especially the thyroid, adrenals, pituitary and pancreas). . Blood sugar levels may change and you may develop diabetes. If you already have diabetes, changes may need to be made to your diabetes medication. . Severe allergic skin reaction which can very rarely be fatal. You may develop blisters on your skin that are filled with fluid or a severe red rash all over your body that may be painful. . Changes in your central nervous system can happen. The central nervous system is made up of your brain and spinal cord. You could feel extreme tiredness, agitation, confusion, hallucinations (see or hear things that are not there), trouble understanding or speaking, loss of control of your bowels or bladder, eyesight changes, numbness or lack of strength to your arms, legs, face, or body, and coma. If you start to have any of these symptoms let your doctor know right away. . While you are getting this drug in your vein (IV), you may have a reaction to the drug. Sometimes you may be given medication to stop or lessen these side effects. Your nurse will check you closely  for these signs: fever or shaking chills, flushing, facial swelling, feeling dizzy, headache, trouble breathing, rash, itching, chest tightness, or chest pain. These reactions may happen after your infusion. If this happens, call 911 for emergency care. . Increased risk of complications, which may very rarely be fatal, in patients who will undergo a stem cell transplant after receiving  nivolumab.  Important Information . This drug may be present in the saliva, tears, sweat, urine, stool, vomit, semen, and vaginal secretions. Talk to your doctor and/or your nurse about the necessary precautions to take during this time.  Treating Side Effects . To decrease infection, wash your hands regularly . Avoid close contact with people who have a cold, the flu, or other infections. . Take your temperature as your doctor or nurse tells you, and whenever you feel like you may have a fever . To help decrease bleeding, use a soft toothbrush. Check with your nurse before using dental floss. . Be very careful when using knives or tools . Use an electric shaver instead of a razor . Ask your doctor or nurse about medicines that are available to help stop or lessen constipation, diarrhea and/or nausea. . Drink plenty of fluids (a minimum of eight glasses per day is recommended). . If you are not able to move your bowels, check with your doctor or nurse before you use any enemas, laxatives, or suppositories . To help with nausea and vomiting, eat small, frequent meals instead of three large meals a day. Choose foods and drinks that are at room temperature. Ask your nurse or doctor about other helpful tips and medicine that is available to help or stop lessen these symptoms. . If you get diarrhea, eat low-fiber foods that are high in protein and calories and avoid foods that can irritate your digestive tracts or lead to cramping. Ask your nurse or doctor about medicine that can lessen or stop your diarrhea. . To help with decreased appetite, eat small, frequent meals . Eat high caloric food such as pudding, ice cream, yogurt and milkshakes. . Manage tiredness by pacing your activities for the day. Be sure to include periods of rest between energy-draining activities . Keeping your pain under control is important to your wellbeing. Please tell your doctor or nurse if you are experiencing pain. . If  you have diabetes, keep good control of your blood sugar level. Tell your nurse or your doctor if your glucose levels are higher or lower than normal . If you get a rash do not put anything on it unless your doctor or nurse says you may. Keep the area around the rash clean and dry. Ask your doctor for medicine if your rash bothers you. . Infusion reactions may occur after your infusion. If this happens, call 911 for emergency care.  Food and Drug Interactions . There are no known interactions of nivolumab with food or other medications. . Tell your doctor and pharmacist about all the medicines and dietary supplements (vitamins, minerals, herbs and others) that you are taking at this time. The safety and use of dietary supplements and alternative diets are often not known. Using these might affect your cancer or interfere with your treatment. Until more is known, you should not use dietary supplements or alternative diets without your cancer doctor's help.  When to Call the Doctor Call your doctor or nurse if you have any of these symptoms and/or any new or unusual symptoms: . Fever of 100.5 F (38 C) or higher . Chills .  Easy bleeding or bruising . Wheezing or trouble breathing . Dry cough, or cough with yellow, green or bloody mucus . Confusion and/or agitation . Hallucinations . Trouble understanding or speaking . Blurry vision or changes in your eyesight . Numbness or lack of strength to your arms, legs, face, or body . Feeling dizzy or lightheaded . Loose bowel movements (diarrhea) more than 4 times a day or diarrhea with weakness or lightheadedness . Nausea that stops you from eating or drinking, and/or that is not relieved by prescribed medicines . Lasting loss of appetite or rapid weight loss of five pounds in a week . No bowel movement for 3 days or you feel uncomfortable . Bad abdominal pain, especially in upper right area . Fatigue or extreme weakness that interferes with normal  activities . Decreased urine . Unusual thirst or passing urine often . Rash that is not relieved by prescribed medicines . Rash or itching . Flu-like symptoms: fever, headache, muscle and joint aches, and fatigue (low energy, feeling weak) . Signs of liver problems: dark urine, pale bowel movements, bad stomach pain, feeling very tired and weak, unusual itching, or yellowing of the eyes or skin. . Signs of infusion reaction: fever or shaking chills, flushing, facial swelling, feeling dizzy, headache, trouble breathing, rash, itching, chest tightness, or chest pain. . If you think you may be pregnant  Reproduction Warnings . Pregnancy warning: This drug can have harmful effects on the unborn baby. Women of child bearing potential should use effective methods of birth control during your cancer treatment and for at least 5 months after treatment. Let your doctor know right away if you think you may be pregnant. . Breastfeeding warning: It is not known if this drug passes into breast milk. For this reason, women should not breast feed during treatment because this drug could enter the breast milk and cause harm to a breast feeding baby. . Fertility warning: Human fertility studies have not been done with this drug. Talk with your doctor or nurse if you plan to have children. Ask for information on sperm or egg banking.     Ipilimumab Curt Bears)  About This Drug Ipilimumab is used to treat cancer. It is given in the vein (IV).  Possible Side Effects . Tiredness . Loose bowel movements (diarrhea) . Colitis. This is swelling (inflammation) in the colon - symptoms are loose bowel movements (diarrhea) stomach cramping, and sometimes blood in the bowel movements . Nausea and throwing up (vomiting) . Decreased appetite (decreased hunger) . Weight loss . Headache . Fever . Trouble sleeping . Rash and itching Note: Each of the side effects above was reported in 5% or greater of patients  treated with ipilimumab. Not all possible side effects are included above.  Warnings and Precautions . This drug works with your immune system and can cause inflammation in any of your organs and tissues and can change how they work. This may put you at risk for developing serious medical problems which can very rarely be fatal. . Severe allergic skin reaction which can very rarely be fatal. You may develop blisters on your skin that are filled with fluid or a severe red rash all over your body that may be painful. . Severe colitis which can very rarely be fatal. This is swelling (inflammation) in the colon. . Changes in your liver function which can very rarely cause liver failure and be fatal . This drug may affect some of your hormone glands (especially the thyroid, adrenals, pituitary  and pancreas). . Inflammation of your nerves. You may feel numbness, tingling, or pain in your hands and feet. It may be hard for you to button your clothes, open jars, or walk as usual. The effect on the nerves may get worse with more doses of the drug. These effects get better in some people after the drug is stopped but it does not get better in all people. Very rarely, this can affect the nerves and muscles in your upper and lower body and cause paralysis. . Inflammation of your eye and/or other changes in eyesight  Important Information . This drug may be present in the saliva, tears, sweat, urine, stool, vomit, semen, and vaginal secretions. Talk to your doctor and/or your nurse about the necessary precautions to take during this time.  Treating Side Effects . Drink plenty of fluids (a minimum of eight glasses per day is recommended) . To help with decreased appetite, eat small, frequent meals . Eat high caloric food such as pudding, ice cream, yogurt and milkshakes. . To help with weight loss, drink fluids that contribute calories (whole milk, juice, soft drinks, sweetened beverages, milkshakes,  and nutritional supplements) instead of water. . Include a source of protein at every meal and snack, such as meat, poultry, fish, dry beans, tofu, eggs, nuts, milk, yogurt, cheese, ice cream, pudding, and nutritional supplements. . If you throw up or have loose bowel movements, you should drink more fluids so that you do not become dehydrated (lack water in the body from losing too much fluid). . To help with nausea and vomiting, eat small, frequent meals instead of three large meals a day. Choose foods and drinks that are at room temperature. Ask your nurse or doctor about other helpful tips and medicine that is available to help or stop lessen these symptoms. . If you get diarrhea, eat low-fiber foods that are high in protein and calories and avoid foods that can irritate your digestive tracts or lead to cramping. . Ask your nurse or doctor about medicine that can lessen or stop your diarrhea. Marland Kitchen Keeping your pain under control is important to your well-being. Please tell your doctor or nurse if you are experiencing pain. . If you get a rash do not put anything on it unless your doctor or nurse says you may. Keep the area around the rash clean and dry. Ask your doctor for medicine if your rash bothers you . If you are having trouble sleeping, talk to your nurse or doctor on tips to help you sleep better . If you have numbness and tingling in your hands and feet, be careful when cooking, walking, and handling sharp objects and hot liquids.  Food and Drug Interactions . There are no known interactions of ipilimumab with food or other medications. . Tell your doctor and pharmacist about all the medicines and dietary supplements (vitamins, minerals, herbs and others) that you are taking at this time. The safety and use of dietary supplements and alternative diets are often not known. Using these might affect your cancer or interfere with your treatment. Until more is known, you should not use  dietary supplements or alternative diets without your cancer doctor's help.  When to Call the Doctor Call your doctor or nurse if you have any of these symptoms and/or any new or unusual symptoms: . Fever of 100.5 F (38 C) or higher . Chills . Flu-like symptoms: fever, headache, muscle and joint aches, and fatigue (low energy, feeling weak) . Lasting loss  of appetite or rapid weight loss of five pounds in a week . Nausea that stops you from eating or drinking and/or is not relieved by prescribed medicines . Throwing up more than 3 times a day . Loose bowel movements (diarrhea) 4 times or loose bowel movements with lack of strength or a feeling of being dizzy . Pain in your abdomen that does not go away . Blood in your stool . Headache that does not go away . Blurred vision or other changes in eyesight . Signs of possible liver problems: dark urine, pale bowel movements, bad stomach pain, feeling very tired and weak, unusual itching, or yellowing of the eyes or skin . A new rash or a rash that is not relieved by prescribed medicines . Numbness, tingling, or pain your hands and feet . If you think you may be pregnant  Reproduction Warnings . Pregnancy warning: This drug can have harmful effects on the unborn baby. Women of child bearing potential should use effective methods of birth control during your cancer treatment and for at least 3 months after treatment. Let your doctor know right away if you think you may be pregnant . Breastfeeding warning: It is not known if this drug passes into breast milk. For this reason, women should not breast feed during treatment and for 3 months after treatment because this drug could enter the breast milk and cause harm to a breast feeding baby. . Fertility warning: Human fertility studies have not been done with this drug. Talk with your doctor or nurse if you plan to have children. Ask for information on sperm or egg banking.    SELF CARE  ACTIVITIES WHILE ON CHEMOTHERAPY: Hydration Increase your fluid intake 48 hours prior to treatment and drink at least 8 to 12 cups (64 ounces) of water/decaff beverages per day after treatment. You can still have your cup of coffee or soda but these beverages do not count as part of your 8 to 12 cups that you need to drink daily. No alcohol intake.  Medications Continue taking your normal prescription medication as prescribed.  If you start any new herbal or new supplements please let us know first to make sure it is safe.  Mouth Care Have teeth cleaned professionally before starting treatment. Keep dentures and partial plates clean. Use soft toothbrush and do not use mouthwashes that contain alcohol. Biotene is a good mouthwash that is available at most pharmacies or may be ordered by calling [phone]. Use warm salt water gargles (1 teaspoon salt per 1 quart warm water) before and after meals and at bedtime. Or you may rinse with 2 tablespoons of three-percent hydrogen peroxide mixed in eight ounces of water. If you are still having problems with your mouth or sores in your mouth please call the clinic. If you need dental work, please let Dr. Whitney Muse know before you go for your appointment so that we can coordinate the best possible time for you in regards to your chemo regimen. You need to also let your dentist know that you are actively taking chemo. We may need to do labs prior to your dental appointment.   Skin Care Always use sunscreen that has not expired and with SPF (Sun Protection Factor) of 50 or higher. Wear hats to protect your head from the sun. Remember to use sunscreen on your hands, ears, face, & feet.  Use good moisturizing lotions such as udder cream, eucerin, or even Vaseline. Some chemotherapies can cause dry skin, color  changes in your skin and nails.    . Avoid long, hot showers or baths. . Use gentle, fragrance-free soaps and laundry detergent. . Use moisturizers,  preferably creams or ointments rather than lotions because the thicker consistency is better at preventing skin dehydration. Apply the cream or ointment within 15 minutes of showering. Reapply moisturizer at night, and moisturize your hands every time after you wash them.  Hair Loss (if your doctor says your hair will fall out)  . If your doctor says that your hair is likely to fall out, decide before you begin chemo whether you want to wear a wig. You may want to shop before treatment to match your hair color. . Hats, turbans, and scarves can also camouflage hair loss, although some people prefer to leave their heads uncovered. If you go bare-headed outdoors, be sure to use sunscreen on your scalp. . Cut your hair short. It eases the inconvenience of shedding lots of hair, but it also can reduce the emotional impact of watching your hair fall out. . Don't perm or color your hair during chemotherapy. Those chemical treatments are already damaging to hair and can enhance hair loss. Once your chemo treatments are done and your hair has grown back, it's OK to resume dyeing or perming hair. With chemotherapy, hair loss is almost always temporary. But when it grows back, it may be a different color or texture. In older adults who still had hair color before chemotherapy, the new growth may be completely gray.  Often, new hair is very fine and soft.  Infection Prevention Please wash your hands for at least 30 seconds using warm soapy water. Handwashing is the #1 way to prevent the spread of germs. Stay away from sick people or people who are getting over a cold. If you develop respiratory systems such as green/yellow mucus production or productive cough or persistent cough let us know and we will see if you need an antibiotic. It is a good idea to keep a pair of gloves on when going into grocery stores/[employer] to decrease your risk of coming into contact with germs on the carts, etc. Carry alcohol hand gel with  you at all times and use it frequently if out in public. If your temperature reaches 100.5 or higher please call the clinic and let us know.  If it is after hours or on the weekend please go to the ER if your temperature is over 100.5.  Please have your own personal thermometer at home to use.    Sex and bodily fluids If you are going to have sex, a condom must be used to protect the person that isn't taking chemotherapy. Chemo can decrease your libido (sex drive). For a few days after chemotherapy, chemotherapy can be excreted through your bodily fluids.  When using the toilet please close the lid and flush the toilet twice.  Do this for a few day after you have had chemotherapy.    Effects of chemotherapy on your sex life Some changes are simple and won't last long. They won't affect your sex life permanently. Sometimes you may feel: . too tired . not strong enough to be very active . sick or sore  . not in the mood . anxious or low Your anxiety might not seem related to sex. For example, you may be worried about the cancer and how your treatment is going. Or you may be worried about money, or about how you family are coping with your illness.  These things can cause stress, which can affect your interest in sex. It's important to talk to your partner about how you feel. Remember - the changes to your sex life don't usually last long. There's usually no medical reason to stop having sex during chemo. The drugs won't have any long term physical effects on your performance or enjoyment of sex. Cancer can't be passed on to your partner during sex  Contraception It's important to use reliable contraception during treatment. Avoid getting pregnant while you or your partner are having chemotherapy. This is because the drugs may harm the baby. Sometimes chemotherapy drugs can leave a man or woman infertile.  This means you would not be able to have children in the future. You might want to talk to  someone about permanent infertility. It can be very difficult to learn that you may no longer be able to have children. Some people find counselling helpful. There might be ways to preserve your fertility, although this is easier for men than for women. You may want to speak to a fertility expert. You can talk about sperm banking or harvesting your eggs. You can also ask about other fertility options, such as donor eggs. If you have or have had breast cancer, your doctor might advise you not to take the contraceptive pill. This is because the hormones in it might affect the cancer.  It is not known for sure whether or not chemotherapy drugs can be passed on through semen or secretions from the vagina. Because of this some doctors advise people to use a barrier method if you have sex during treatment. This applies to vaginal, anal or oral sex. Generally, doctors advise a barrier method only for the time you are actually having the treatment and for about a week after your treatment. Advice like this can be worrying, but this does not mean that you have to avoid being intimate with your partner. You can still have close contact with your partner and continue to enjoy sex.  Animals If you have cats or birds we just ask that you not change the litter or change the cage.  Please have someone else do this for you while you are on chemotherapy.   Food Safety During and After Cancer Treatment Food safety is important for people both during and after cancer treatment. Cancer and cancer treatments, such as chemotherapy, radiation therapy, and stem cell/bone marrow transplantation, often weaken the immune system. This makes it harder for your body to protect itself from foodborne illness, also called food poisoning. Foodborne illness is caused by eating food that contains harmful bacteria, parasites, or viruses.  Foods to avoid Some foods have a higher risk of becoming tainted with bacteria. These  include: Marland Kitchen Unwashed fresh fruit and vegetables, especially leafy vegetables that can hide dirt and other contaminants . Raw sprouts, such as alfalfa sprouts . Raw or undercooked beef, especially ground beef, or other raw or undercooked meat and poultry . Fatty, fried, or spicy foods immediately before or after treatment.  These can sit heavy on your stomach and make you feel nauseous. . Raw or undercooked shellfish, such as oysters. . Sushi and sashimi, which often contain raw fish.  . Unpasteurized beverages, such as unpasteurized fruit juices, raw milk, raw yogurt, or cider . Undercooked eggs, such as soft boiled, over easy, and poached; raw, unpasteurized eggs; or foods made with raw egg, such as homemade raw cookie dough and homemade mayonnaise Simple steps for food safety Shop smart. Marland Kitchen  Do not buy food stored or displayed in an unclean area. . Do not buy bruised or damaged fruits or vegetables. . Do not buy cans that have cracks, dents, or bulges. . Pick up foods that can spoil at the end of your shopping trip and store them in a cooler on the way home. Prepare and clean up foods carefully. . Rinse all fresh fruits and vegetables under running water, and dry them with a clean towel or paper towel. . Clean the top of cans before opening them. . After preparing food, wash your hands for 20 seconds with hot water and soap. Pay special attention to areas between fingers and under nails. . Clean your utensils and dishes with hot water and soap. [place] Kitchen Disinfect your kitchen and cutting boards using 1 teaspoon of liquid, unscented bleach mixed into 1 quart of water.   Dispose of old food. . Eat canned and packaged food before its expiration date (the "use by" or "best before" date). . Consume refrigerated leftovers within 3 to 4 days. After that time, throw out the food. Even if the food does not smell or look spoiled, it still may be unsafe. Some bacteria, such as Listeria, can grow even on foods  stored in the refrigerator if they are kept for too long. Take precautions when eating out. . At restaurants, avoid buffets and salad bars where food sits out for a long time and comes in contact with many people. Food can become contaminated when someone with a virus, often a norovirus, or another "bug" handles it. . Put any leftover food in a "to-go" container yourself, rather than having the server do it. And, refrigerate leftovers as soon as you get home. . Choose restaurants that are clean and that are willing to prepare your food as you order it cooked.    MEDICATIONS:   EMLA cream. Apply a quarter size amount to port site 1 hour prior to chemo. Do not rub in. Cover with plastic wrap.   Over-the-Counter Meds:  Miralax 1 capful in 8 oz of fluid daily. May increase to two times a day if needed. This is a stool softener. If this doesn't work proceed you can add:  Senokot S-start with 1 tablet two times a day and increase to 4 tablets two times a day if needed. (total of 8 tablets in a 24 hour period). This is a stimulant laxative.   Call us if this does not help your bowels move.   Imodium 2mg  capsule. Take 2 capsules after the 1st loose stool and then 1 capsule every 2 hours until you go a total of 12 hours without having a loose stool. Call the [facility] if loose stools continue. If diarrhea occurs @ bedtime, take 2 capsules @ bedtime. Then take 2 capsules every 4 hours until morning. Call [facility].     Diarrhea Sheet  If you are having loose stools/diarrhea, please purchase Imodium and begin taking as outlined:  At the first sign of poorly formed or loose stools you should begin taking Imodium(loperamide) 2 mg capsules.  Take two caplets (4mg ) followed by one caplet (2mg ) every 2 hours until you have had no diarrhea for 12 hours.  During the night take two caplets (4mg ) at bedtime and continue every 4 hours during the night until the morning.  Stop taking Imodium only after  there is no sign of diarrhea for 12 hours.    Always call the [facility] if you are having loose  stools/diarrhea that you can't get under control.  Loose stools/disrrhea leads to dehydration (loss of water) in your body.  We have other options of trying to get the loose stools/diarrhea to stopped but you must let us know!     Constipation Sheet *Miralax in 8 oz of fluid daily.  May increase to two times a day if needed.  This is a stool softener.  If this not enough to keep your bowel regular:  You can add:  *Senokot S, start with one tablet twice a day and can increase to 4 tablets twice a day if needed.  This is a stimulant laxative.   Sometimes when you take pain medication you need BOTH a medicine to keep your stool soft and a medicine to help your bowel push it out!  Please call if the above does not work for you.   Do not go more than 2 days without a bowel movement.  It is very important that you do not become constipated.  It will make you feel sick to your stomach (nausea) and can cause abdominal pain and vomiting.     SYMPTOMS TO REPORT AS SOON AS POSSIBLE AFTER TREATMENT:  FEVER GREATER THAN 100.5 F  CHILLS WITH OR WITHOUT FEVER  NAUSEA AND VOMITING THAT IS NOT CONTROLLED WITH YOUR NAUSEA MEDICATION  UNUSUAL SHORTNESS OF BREATH  UNUSUAL BRUISING OR BLEEDING  TENDERNESS IN MOUTH AND THROAT WITH OR WITHOUT PRESENCE OF ULCERS  URINARY PROBLEMS  BOWEL PROBLEMS  UNUSUAL RASH    Wear comfortable clothing and clothing appropriate for easy access to any Portacath or PICC line. Let us know if there is anything that we can do to make your therapy better!     What to do if you need assistance after hours or on the weekends: CALL [phone].  HOLD on the line, do not hang up.  You will hear multiple messages but at the end you will be connected with a nurse triage line.  They will contact the doctor if necessary.  Most of the time they will be able to assist  you.  Do not call the hospital operator.      I have been informed and understand all of the instructions given to me and have received a copy. I have been instructed to call the clinic [phone]  or my family physician as soon as possible for continued medical care, if indicated. I do not have any more questions at this time but understand that I may call the [facility] at [phone] during office hours should I have questions or need assistance in obtaining follow-up care.

## 2016-10-04 ENCOUNTER — Encounter (HOSPITAL_COMMUNITY): Payer: Self-pay | Admitting: Emergency Medicine

## 2016-10-04 NOTE — Progress Notes (Signed)
Chemotherapy teaching pulled together and appts made.

## 2016-10-08 ENCOUNTER — Ambulatory Visit (HOSPITAL_COMMUNITY): Payer: Self-pay

## 2016-10-15 ENCOUNTER — Encounter (HOSPITAL_COMMUNITY): Payer: Self-pay | Attending: Oncology | Admitting: Oncology

## 2016-10-15 ENCOUNTER — Ambulatory Visit (HOSPITAL_COMMUNITY): Payer: Self-pay

## 2016-10-15 ENCOUNTER — Encounter (HOSPITAL_COMMUNITY): Payer: Medicaid Other

## 2016-10-15 VITALS — BP 106/68 | HR 91 | Temp 97.9°F | Resp 20 | Wt 206.0 lb

## 2016-10-15 DIAGNOSIS — C3490 Malignant neoplasm of unspecified part of unspecified bronchus or lung: Secondary | ICD-10-CM

## 2016-10-15 DIAGNOSIS — C787 Secondary malignant neoplasm of liver and intrahepatic bile duct: Secondary | ICD-10-CM

## 2016-10-15 DIAGNOSIS — E279 Disorder of adrenal gland, unspecified: Secondary | ICD-10-CM

## 2016-10-15 DIAGNOSIS — C3411 Malignant neoplasm of upper lobe, right bronchus or lung: Secondary | ICD-10-CM

## 2016-10-15 DIAGNOSIS — K869 Disease of pancreas, unspecified: Secondary | ICD-10-CM

## 2016-10-15 DIAGNOSIS — Z720 Tobacco use: Secondary | ICD-10-CM

## 2016-10-15 DIAGNOSIS — D6481 Anemia due to antineoplastic chemotherapy: Secondary | ICD-10-CM

## 2016-10-15 DIAGNOSIS — R05 Cough: Secondary | ICD-10-CM

## 2016-10-15 MED ORDER — OXYCODONE-ACETAMINOPHEN 5-325 MG PO TABS
1.0000 | ORAL_TABLET | ORAL | 0 refills | Status: DC | PRN
Start: 2016-10-15 — End: 2016-11-01

## 2016-10-15 MED ORDER — SODIUM CHLORIDE 0.9 % IV SOLN: Freq: Once | INTRAVENOUS | Status: DC

## 2016-10-15 NOTE — Progress Notes (Signed)
Side effects of opdivo and yervoy went over with pt and he will call in about 1 week with his decision about how he wants to move forward with treatment.

## 2016-10-15 NOTE — Progress Notes (Signed)
Patient wants to wait another week to be treated. He prefers to go home and speak with his family about moving forward with treatment. He saw Dr. Talbert Cage today and she told him that he had approximately 1 year with or without treatment so he needs time to digest this information. Patient to contact [facility] when he is ready to return for treatment.

## 2016-10-15 NOTE — Progress Notes (Signed)
[facility] Note  Patient Care Team: [name], MD as PCP - General (Family Medicine) Stanford Breed Denice Bors, MD as Consulting Physician (Cardiology) [name], MD as Consulting Physician (Cardiothoracic Surgery) [name], MD as Consulting Physician (Cardiology) Twana First, MD as Consulting Physician (Oncology)  CHIEF COMPLAINTS:  Advanced Lung Cancer Clinical Stage IIIB (T4N2M0)  CT Chest with 8.7 x 8.7 cm R hilar mass encasing R pulmonary artery and occluding the R upper lobe bronchus, mild airspace disease in the peripheral R upper lobe probably postobstructive pneumonia 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       01/18/2015 Imaging    CT chest- 8.7 x 8.7 cm R hilar mass encasing the R pulm artery and occluding the R upper lobe bronchus. Sm R pleural effusion; malignant or reactive. 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 34 mm x 26 mm mass in body of pancreas      01/21/2015 Imaging    MRI brain- No acute intracranial findings. No signs of metastatic disease. Minor white matter disease, stable and nonspecific.      01/26/2015 Pathology Results    Lung, biopsy, right upper lobe - SMALL CELL CARCINOMA.      01/26/2015 Pathology Results    Diagnosis FINE NEEDLE ASPIRATION: ENDOSCOPIC SPECIMEN A, EBUS LEVEL 7 NODE (SPECIMEN 1 OF 3 COLLECTED 01/26/2015) MALIGNANT CELLS PRESENT, CONSISTENT WITH SMALL CELL CARCINOMA.      01/26/2015 Pathology Results    WANG NEEDLE ASPIRATION, SPECIMEN B LEVEL 7 NODE (SPECIMEN 2 OF 3 COLLECTED 01/26/2015) MALIGNANT  CELLS PRESENT, CONSISTENT WITH SMALL CELL CARCINOMA.      01/26/2015 Pathology Results    Diagnosis BRONCHIAL BRUSHING SPECIMEN C, RIGHT UPPER LOBE (SPECIMEN 3 OF 3 COLLECTED 01/26/2015) ATYPICAL CELLS PRESENT.      01/27/2015 PET scan    Large hypermetabolic right hilar mass with mediastinal invasion and infrahilar extension, maximum standard uptake value 13.0.  Pancreatic tail mass, approximately 4.5 cm in diameter. Abdominal aortic aneurysm, 5.8 cm in diameter.      02/10/2015 Pathology Results    Diagnosis FINE NEEDLE ASPIRATION, ENDOSCOPIC, PANCREAS TAIL (SPECIMEN 1 OF 1 COLLECTED 02/10/15): MALIGNANT CELLS CONSISTENT WITH METASTATIC SMALL CELL CARCINOMA.      02/14/2015 - 03/06/2015 Chemotherapy    Cisplatin/Etoposide      03/06/2015 Treatment Plan Change    Change systemic therapy to Carboplatin based      03/07/2015 - 05/10/2015 Chemotherapy    Carboplatin/Etoposide.      04/13/2015 Imaging    resolution of pancreatic metastases, decrease in size of R perihilar mss and RUL mass. Interval response to therapy      05/10/2015 - 05/31/2015 Chemotherapy    Change to carboplatin/irinotecan for cycle #5 and #6 secondary to significant skin toxicity presumably from etoposide      07/12/2015 -  Radiation Therapy    25 Gy to whole brain and 30 Gy to R hilar region by Dr. Lisbeth Renshaw.      09/13/2015 Imaging    CT C/A/P unchanged from 2/28. persistent prominence  of peribronchovascular intersitium throughout R lung, in perihilar region, no definitive findings to suggest progression of disease on exam      11/03/2015 Imaging    MRI brain 3 mm area of restricted diffusion R posterior temporal lobe without abnormal enhancement favor acute infarct, mild dural thickening enhancement over R hemisphere now present previously      11/15/2015 Imaging    CT C/A/P Interval development of 1.6 x 1.4 cm L adrenal nodule suspicious for metastatic lesion. No other sites of new metastatic disease are noted  elsewhere in C/A/P. Evolving postradiation changes in R lung      12/01/2015 - 12/14/2015 Radiation Therapy    Radiation therapy to L adrenal nodule      01/18/2016 Imaging    CT C/A/P 1. Stable appearance of the chest. Specifically, there are post XRT findings in the right lung with perihilar and hilar airway thickening, some upper lobe airway plugging, and scattered densities which merit observation but which are likely therapy rib related. 2. Centrilobular emphysema  3. Coronary, aortic arch, and branch vessel atherosclerotic vascular disease 4. On prior PET-CT from 01/27/2015 there was a pancreatic body mass. Prior biopsy from 02/10/2015 showed this to be consistent with metastatic small cell carcinoma. This mass is not visible on today's CT scan, and has presumably resolved with treatment. There is a transition from a somewhat atrophic pancreatic tail to a normal caliber pancreatic body occurring in the vicinity of the prior mass. 5. Reduced size of a small left adrenal mass, currently 1.2 by 1.7 cm 6. Infrarenal abdominal aortic aneurysm up to 5.8 cm diameter, with patent aorta bi-iliac stent graft in place and no evidence of endoleak 7. Lumbar spondylosis and degenerative disc disease with mild impingement at L3-4 and L4-5      02/02/2016 Imaging    No evidence of metastatic disease to the brain.Resolution of small right hemispheric fluid collection. No leptomeningeal enhancement identified on today's study. This finding may have been due to a small subdural hematoma previously which has resolved.Negative for acute infarct. Small area of restricted diffusion in the right posterior temporal lobe on the prior study likely was an area of acute infarct which has resolved.      04/13/2016 Imaging    CT CAP- 1. Relatively similar appearance of areas of right-sided radiation change. No well-defined residual or recurrent disease identified. 2. No thoracic adenopathy. 3. No evidence of  metastatic disease in the abdomen or pelvis.      07/10/2016 Imaging    MRI brain- 1. No evidence of intracranial metastatic disease within limitations of mild motion artifact. 2. Unchanged subcentimeter lesion in the clivus. Small osseous metastasis not excluded.      07/20/2016 Imaging    CT CAP- 9.5 x 4.6 cm central right upper lobe mass extending to the perihilar region, new, compatible with lung cancer recurrence. Associated subpleural nodularity in the right upper lobe, suspicious for metastatic disease.  Associated mild mediastinal lymphadenopathy, compatible with nodal metastases.  Multifocal hepatic lesions in both lobes, new, compatible with hepatic metastases, with index lesions as above.  Additional ancillary findings as above.      07/20/2016 Relapse/Recurrence    Last Treatment Date: 12/14/15 XRT; 06/10/15 Chemotherapy Recent Lab Values: Recent Lab Values:  N/A      08/23/2016 Imaging    c/o RLE pain. Doppler ultrasound done and referred to vascular surgeon: IMPRESSION: No evidence of DVT with the right lower extremity. 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 Arterial Doppler or CT angiogram is recommended for further evaluation.      09/28/2016 Progression    Restaging CT C/A/P:  Stable slight interval increase in size of large right upper lobe mass extending into the right hilum.  There is increased ground-glass nodularity peripherally predominately within the right upper lobe and involving the right middle lobe which may represent superimposed infectious/inflammatory process. Disease progression in this location is not excluded. Recommend attention on followup.  Interval increase in size of multiple hepatic metastatic lesions.  Stable to mild interval decrease in size of mediastinal adenopathy.       HISTORY OF PRESENTING ILLNESS:  [name] 56 y.o. male is here for further up of extensive stage  SCLC. He is accompanied by family today.  Mr. Nunziata presents today for continuing follow up. He is here to start cycle 1 of palliative treatment with Opdivo and Yervoy after he was found to have progressive disease on most recent scans. Patient continues to complain of a dry cough whenever he lays flat. He states that he has noted that his coughing improves at nighttime when he sleeps with his head elevated. He also has some shortness of breath when he lays flat. Otherwise has no new complaints. He denies any chest pain, appetite change, weight loss, abdominal pain.   MEDICAL HISTORY:  Past Medical History:  Diagnosis Date  . AAA (abdominal aortic aneurysm) ([place])   . Arthritis   . COPD (chronic obstructive pulmonary disease) ([place])   . Coronary artery disease 2002   Lesion in LAD s/p angioplasty; s/p CABG x 4 Sept 2013  . Essential hypertension, benign   . Hyperlipidemia   . Myocardial infarct ([place]) 1999, 2002  . Pancreatitis   . Small cell lung cancer ([place])    Stage IIIB with extension the pancreas    SURGICAL HISTORY: Past Surgical History:  Procedure Laterality Date  . ABDOMINAL AORTIC ENDOVASCULAR STENT GRAFT N/A 08/11/2015   Procedure: ABDOMINAL AORTIC ENDOVASCULAR STENT GRAFT;  Surgeon: Serafina Mitchell, MD;  Location[facility];  Service: Vascular;  Laterality: N/A;  . [place], 2002  . CORONARY ARTERY BYPASS GRAFT  01/02/2012   Procedure: CORONARY ARTERY BYPASS GRAFTING (CABG);  Surgeon: [name], MD;  Location: [facility];  Service: Open Heart Surgery;  Laterality: N/A;  times three using left internal mammary artery and right endoscopically harvested saphenous vein  . EUS N/A 02/10/2015   Procedure: UPPER ENDOSCOPIC ULTRASOUND (EUS) LINEAR;  Surgeon: [name], MD;  Location: [facility] ENDOSCOPY;  Service: Endoscopy;  Laterality: N/A;  . KNEE SURGERY Left    ACL repair  . LEFT HEART CATHETERIZATION WITH CORONARY ANGIOGRAM N/A 01/01/2012   Procedure: LEFT HEART  CATHETERIZATION WITH CORONARY ANGIOGRAM;  Surgeon: Hillary Bow, MD;  Location: [facility] CATH LAB;  Service: Cardiovascular;  Laterality: N/A;  . LUNG BIOPSY N/A 01/26/2015   Procedure: LUNG BIOPSY;  Surgeon: [name], MD;  Location: [facility];  Service: Thoracic;  Laterality: N/A;  . LYMPH NODE BIOPSY N/A 01/26/2015   Procedure: LYMPH NODE BIOPSY;  Surgeon: [name], MD;  Location: [facility];  Service: Thoracic;  Laterality: N/A;  . MULTIPLE EXTRACTIONS WITH ALVEOLOPLASTY  02/21/2012   Procedure: MULTIPLE EXTRACION WITH ALVEOLOPLASTY;  Surgeon: Lenn Cal, DDS;  Location: [facility] ORS;  Service: Oral Surgery;  Laterality: N/A;  Extaction of tooth #'s 4,5,6,7,10,11,12,13,14,15,19,20,21,22,23,24,25,   . PORTACATH PLACEMENT Left 02/01/2015   Procedure: ATTEMPTED INSERTION OF PORT-A-CATH;  Surgeon: [name], MD;  Location: [facility];  Service: Thoracic;  Laterality: Left;  . TEE WITHOUT CARDIOVERSION  01/02/2012   Procedure: TRANSESOPHAGEAL ECHOCARDIOGRAM (TEE);  Surgeon: [name], MD;  Location: [facility];  Service: Open Heart Surgery;  Laterality: N/A;  . VIDEO BRONCHOSCOPY WITH ENDOBRONCHIAL ULTRASOUND N/A 01/26/2015   Procedure: VIDEO BRONCHOSCOPY WITH ENDOBRONCHIAL ULTRASOUND;  Surgeon: [name], MD;  Location: [facility];  Service: Thoracic;  Laterality: N/A;    SOCIAL HISTORY: Social History   Social History  . Marital status: Married    Spouse name: N/A  . Number of children: 5  . Years of education: N/A   Occupational History  . Works in [employer]  . Smoking status: Current Every Day Smoker    Packs/day: 2.00    Years: 35.00    Types: Cigarettes    Start date: 06/30/1976  . Smokeless tobacco: Never Used  . Alcohol use No     Comment: Maybe a 12 pack/mth if that - very rare since 2013  . Drug use: No  . Sexual activity: Yes   Other Topics Concern  . Not on file   Social History Narrative  . No narrative on file   They've  been married 17 years, with five kids.  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 Heart disease Father   . Diabetes Father   . Hyperlipidemia Father   . Hypertension Father   . Hyperlipidemia Mother   . Hypertension Mother   . Stroke Mother   . AAA (abdominal aortic aneurysm) Mother   . Diabetes Paternal Grandmother   . Cancer Neg Hx    indicated that his mother is alive. He indicated that his father is alive. He indicated that his sister is alive. He indicated that his brother is alive. He indicated that his maternal grandmother is deceased. He indicated that his maternal grandfather is deceased. He indicated that his paternal grandmother is deceased. He indicated that his paternal grandfather is deceased. He indicated that the status of his neg hx is unknown.    His father's somewhere in [place]; his mother lives here in [place] and works at [employer] 76 at the end of this month She's got high blood pressure, high cholesterol, no strokes that they know of; is on blood thinners Has an aneurism at the base of the aorta where it goes into your stomach He knows his father's had high blood pressure and diabetes; Parents have been divorced since he was 3-4 Has half brothers / half sisters   ALLERGIES:  is allergic to lisinopril and tea.  MEDICATIONS:  Current Outpatient Prescriptions  Medication Sig Dispense Refill  . amLODipine (NORVASC) 5 MG tablet TAKE ONE (1) TABLET BY MOUTH EVERY DAY 90 tablet 3  . aspirin EC 81 MG tablet Take 81 mg by mouth daily.    . carvedilol (COREG) 6.25 MG tablet TAKE ONE TABLET BY MOUTH TWICE A DAY  WITH A MEAL 180 tablet 1  . Ipilimumab (YERVOY IV) Inject into the vein. Every 3  weeks    . Nivolumab (OPDIVO IV) Inject into the vein. Every 3 weeks    . ondansetron (ZOFRAN) 8 MG tablet Take 1 tablet (8 mg total) by mouth 2 (two) times daily as needed for refractory nausea / vomiting. Start on day 3 after chemotherapy. 30 tablet 1  . oxyCODONE-acetaminophen (PERCOCET/ROXICET) 5-325 MG tablet Take 1 tablet by mouth every 4 (four) hours as needed for severe pain. 90 tablet 0  . pravastatin (PRAVACHOL) 40 MG tablet TAKE ONE (1) TABLET BY MOUTH EVERY DAY 90 tablet 2  . prochlorperazine (COMPAZINE) 10 MG tablet Take 1 tablet (10 mg total) by mouth every 6 (six) hours as needed (NAUSEA). 30 tablet 1   No current facility-administered medications for this visit.     Review of Systems  Constitutional: Negative.  Negative for chills and fever.  HENT: Negative.  Negative for hearing loss, sore throat and tinnitus.   Eyes: Negative.  Negative for blurred vision, photophobia and discharge.  Respiratory: Positive for cough and shortness of breath. Negative for hemoptysis, sputum production and wheezing.   Cardiovascular: Negative.  Negative for chest pain, palpitations, orthopnea, claudication and leg swelling.  Gastrointestinal: Negative.  Negative for abdominal pain, constipation, diarrhea, melena, nausea and vomiting.  Genitourinary: Negative.  Negative for dysuria and hematuria.  Musculoskeletal: Negative for back pain, joint pain and myalgias.  Skin: Negative.  Negative for itching and rash.  Neurological: Negative.  Negative for dizziness, weakness and headaches.  Endo/Heme/Allergies: Negative.  Negative for environmental allergies and polydipsia. Does not bruise/bleed easily.  Psychiatric/Behavioral: Negative for depression. The patient is not nervous/anxious and does not have insomnia.   All other systems reviewed and are negative. 14 point ROS was done and is otherwise as detailed above or in  HPI   PHYSICAL EXAMINATION: ECOG PERFORMANCE STATUS: 2 - Symptomatic, <50% confined to bed    Physical Exam  Constitutional: He is oriented to person, place, and time.  HENT:  Head: Normocephalic and atraumatic.  Nose: Nose normal.  Eyes: Conjunctivae and EOM are normal. Pupils are equal, round, and reactive to light.  Neck: Normal range of motion. Neck supple.  Cardiovascular: Normal rate, regular rhythm and normal heart sounds.  Exam reveals no gallop and no friction rub.   No murmur heard. Pulmonary/Chest: Effort normal. No respiratory distress. He has no wheezes. He has no rales. He exhibits no tenderness.  Abdominal: Soft. Bowel sounds are normal. He exhibits no distension and no mass. There is no tenderness. There is no rebound and no guarding.  Musculoskeletal: Normal range of motion.  Neurological: He is alert and oriented to person, place, and time. He has normal reflexes.  Skin: Skin is warm and dry.  Psychiatric: Judgment normal.  Nursing note and vitals reviewed.   LABORATORY DATA:  I have reviewed the data as listed.  CBC    Component Value Date/Time   WBC 4.0 10/01/2016 0941   RBC 3.43 (L) 10/01/2016 0941   HGB 10.5 (L) 10/01/2016 0941   HCT 31.5 (L) 10/01/2016 0941   PLT 161 10/01/2016 0941   MCV 91.8 10/01/2016 0941   MCH 30.6 10/01/2016 0941   MCHC 33.3 10/01/2016 0941   RDW 18.6 (H) 10/01/2016 0941   LYMPHSABS 0.8 10/01/2016 0941   MONOABS 0.7 10/01/2016 0941   EOSABS 0.2 10/01/2016 0941   BASOSABS 0.0 10/01/2016 0941   CMP     Component Value Date/Time   NA 134 (L) 10/01/2016 0941   K 4.0 10/01/2016 0941  CL 97 (L) 10/01/2016 0941   CO2 29 10/01/2016 0941   GLUCOSE 105 (H) 10/01/2016 0941   BUN 11 10/01/2016 0941   CREATININE 1.15 10/01/2016 0941   CREATININE 0.99 01/27/2015 1450   CALCIUM 8.7 (L) 10/01/2016 0941   PROT 7.3 10/01/2016 0941   ALBUMIN 3.8 10/01/2016 0941   AST 18 10/01/2016 0941   ALT 13 (L) 10/01/2016 0941   ALKPHOS 48  10/01/2016 0941   BILITOT 0.7 10/01/2016 0941   GFRNONAA >60 10/01/2016 0941   GFRNONAA 87 01/27/2015 1450   GFRAA >60 10/01/2016 0941   GFRAA >89 01/27/2015 1450    RADIOGRAPHIC STUDIES: I have personally reviewed the radiological images as listed and agreed with the findings in the report. Study Result   CLINICAL DATA:  Restaging of extensive stage small cell lung cancer with pancreatic mass. Status post chemotherapy. COPD. Endovascular stent graft. Right-sided primary.  EXAM: CT CHEST, ABDOMEN, AND PELVIS WITH CONTRAST  TECHNIQUE: Multidetector CT imaging of the chest, abdomen and pelvis was performed following the standard protocol during bolus administration of intravenous contrast.  CONTRAST:  149mL ISOVUE-300 IOPAMIDOL (ISOVUE-300) INJECTION 61%  COMPARISON:  01/18/2016  FINDINGS: CT CHEST FINDINGS  Cardiovascular: Prior median sternotomy. Aortic and branch vessel atherosclerosis. Tortuous thoracic aorta. Normal heart size, without pericardial effusion. Pulmonary artery enlargement, 3.4 cm outflow tract. No central pulmonary embolism, on this non-dedicated study.  Mediastinum/Nodes: No supraclavicular adenopathy. No mediastinal adenopathy. Right perihilar soft tissue thickening is unchanged, without well-defined adenopathy.  Lungs/Pleura: No pleural fluid. Right upper lobe bronchial compression anteriorly is similar. Moderate bronchial wall thickening. Moderate centrilobular and paraseptal emphysema.  Interstitial thickening within the peribronchovascular inferior right upper lobe is similar, including image 57/series 4. There is also subpleural reticulation in the more peripheral posterior right upper lobe and superior segment right lower lobe. No well-defined residual or recurrent mass. Anterior right upper lobe subpleural linear opacities are likely radiation induced and new. Thickening of the right major fissure is felt to be similar, including on  image 63/series 4.  Musculoskeletal: No acute osseous abnormality.  CT ABDOMEN PELVIS FINDINGS  Hepatobiliary: Normal liver. Normal gallbladder, without biliary ductal dilatation.  Pancreas: No residual or recurrent pancreatic mass. Atrophy involving the tail is again identified.  Spleen: Normal in size, without focal abnormality.  Adrenals/Urinary Tract: Normal adrenal glands. Normal kidneys, without hydronephrosis. Normal urinary bladder.  This  Stomach/Bowel: Normal stomach, without wall thickening. Normal colon, appendix, and terminal ileum. Normal small bowel.  Vascular/Lymphatic: Aortic and branch vessel atherosclerosis. Status post endograft repair. Native sac measures on the order of 5.0 by 4.6 cm versus 5.5 x 5.8 on the prior. No endoleak. Patent iliac volumes. There is persistent dilatation of proximal internal iliac arteries including at 1.5 cm on the right, similar. No abdominopelvic adenopathy.  Reproductive: Normal prostate.  Other: No significant free fluid. No evidence of omental or peritoneal disease.  Musculoskeletal: No acute osseous abnormality. L3-4 and L4-5 disc bulges.  IMPRESSION: 1. Relatively similar appearance of areas of right-sided radiation change. No well-defined residual or recurrent disease identified. 2. No thoracic adenopathy. 3. No evidence of metastatic disease in the abdomen or pelvis. 4.  Aortic atherosclerosis. 5. Status post aortic endograft repair, with decreased size native sac. Persistent dilatation of the proximal internal iliac arteries bilaterally. 6. Pulmonary artery enlargement suggests pulmonary arterial hypertension.   Electronically Signed   By: Abigail Miyamoto M.D.   On: 04/13/2016 13:46   Right LOWER EXTREMITY VENOUS DOPPLER ULTRASOUND 08/23/2016  IMPRESSION: No  evidence of DVT with the right lower extremity. 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 Arterial Doppler or CT angiogram is recommended for further evaluation. These results will be called to the ordering clinician or representative by the Radiologist Assistant, and communication documented in the PACS or zVision Dashboard.   CT ANGIOGRAPHY OF ABDOMINAL AORTA WITH ILIOFEMORAL RUNOFF 08/23/2016  IMPRESSION: VASCULAR  1. Aorto bi-iliac stent graft with interval finding of occlusion of the right iliac limb of the stent. Mild aneurysmal dilatation of the distal right SFA with near occlusive thrombus present in the distal ossific. Severe narrowing or possible occlusion of the proximal popliteal artery with aneurysmal dilatation of the right popliteal artery which is also occluded. There is occlusion of the proximal calf arteries with string like flow visualized in the posterior tibial and peroneal arteries from about the mid calf to just above the ankle. 2. Mild aneurysmal dilatation of the left common femoral artery. Mild aneurysmal dilatation of the distal left superficial femoral artery. Mild to moderate diffuse disease of the left SFA and popliteal vessels. Single vessel runoff on the left via the posterior tibial artery  NON-VASCULAR  Partially visualized right hilar and peribronchial soft tissue mass. Hepatic metastatic disease as previously demonstrated. There are no acute intra-abdominal or pelvic abnormalities visualized.  Critical Value/emergent results were called by telephone at the time of interpretation on 08/23/2016 at 9:54 pm to Dr. Milton Ferguson , who verbally acknowledged these results.    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 Large hypermetabolic right hilar mass with mediastinal invasion and infrahilar extension  Hyponatremia c/w SIADH  EUS with Dr. Ardis Hughs and pancreatic biopsy on 02/10/2015 c/w SCLC Anemia secondary to chemotherapy CT C/A/P 04/13/2015 with interval response to therapy. Presumed drug reaction to etoposide Treatment related anemia CR to first line therapy R calf pain ABNL MRI brain 11/03/2015 L adrenal nodule   PLAN: - I have discussed with the patient that his cough is due to his underlying progressive disease. The only way that his cough will definitely improved is if he responds to treatment. -I have again discussed side effects of opdivo and yervoy with the patient. -He asked me about prognosis of his disease. I have discussed that he does have stage IV disease and he has a very aggressive cancer. All treatment that is given is palliative in nature, and we are not able to cure him of his small cell lung cancer. 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 However I have told him that we may be able to get his disease back under control with immunotherapy. Patient stated that he would like to discuss his treament plan in more detail with his wife and family prior to proceeding with Opdivo and Curt Bears, therefore he deferred his treatment today. -Refilled Percocet today for pain control. -  Return to clinic in 4 weeks for follow-up.  All questions were answered. The patient knows to call the clinic with any problems, questions or concerns.     Twana First, MD

## 2016-10-16 ENCOUNTER — Ambulatory Visit (HOSPITAL_COMMUNITY): Payer: Self-pay

## 2016-10-16 ENCOUNTER — Telehealth (HOSPITAL_COMMUNITY): Payer: Self-pay | Admitting: Oncology

## 2016-10-16 ENCOUNTER — Encounter: Payer: Self-pay | Admitting: *Deleted

## 2016-10-16 NOTE — Progress Notes (Signed)
[place] Clinical Social Work  Clinical Social Work was referred by patient navigator for assessment of psychosocial needs due to concerns about treatment. Clinical Social Worker contacted patient and wife at home to offer support and assess for needs.  Pt and wife both had concerns about new treatment, but appear to want to move forward with next round of treatment. They do have concerns about cough and CSW will get RN to call today. They report they left message on [facility] VM on Friday and had not heard back to date. Family still processing current situation and plan to take each day as it comes. Wife would like to go back to nursing school this fall and is trying to make that difficult decision as well. CSW provided supportive listening and emotional support. Wife went back to DSS this week to check on medicaid application and nothing had been done. She reports his application was sitting in a box and had not been processed to date. She was very frustrated and upset, but has worked on re-applying at this time. CSW to mail additional gas card assistance resource as well and they plan to seek some help through [facility] as well. CSW will also refer to financial counselor for possible gas or gift card as well.     Clinical Social Work interventions: Resource education and referral Supportive listening  Loren Racer, LCSW, OSW-C [place] [facility]   [phone]

## 2016-10-16 NOTE — Telephone Encounter (Signed)
APPROVED FOR FREE OPDIVO AND YERVOY BMS  10/08/16-10/08/17 [phone] P [phone] F

## 2016-10-17 ENCOUNTER — Telehealth (HOSPITAL_COMMUNITY): Payer: Self-pay | Admitting: Emergency Medicine

## 2016-10-17 NOTE — Telephone Encounter (Signed)
Wife called and said that pt is having to sleep sitting at a angle, but she understands that is part of his disease. Some shortness of breath and wheezing.   We also went over the side effects of his drugs again.  He said that he would start on Monday.  Made an appt on Monday at 8:30 and labs at 8 am.  They verbalized understanding.

## 2016-10-22 ENCOUNTER — Ambulatory Visit (HOSPITAL_COMMUNITY): Payer: Self-pay

## 2016-10-22 ENCOUNTER — Other Ambulatory Visit (HOSPITAL_COMMUNITY): Payer: Self-pay

## 2016-10-29 ENCOUNTER — Other Ambulatory Visit (HOSPITAL_COMMUNITY): Payer: Self-pay | Admitting: Pharmacist

## 2016-11-01 ENCOUNTER — Encounter (HOSPITAL_COMMUNITY): Payer: Self-pay

## 2016-11-01 ENCOUNTER — Encounter (HOSPITAL_COMMUNITY): Payer: Medicaid Other

## 2016-11-01 ENCOUNTER — Encounter (HOSPITAL_COMMUNITY): Payer: Medicaid Other | Attending: Oncology

## 2016-11-01 VITALS — BP 116/73 | HR 92 | Temp 97.7°F | Resp 18 | Wt 201.6 lb

## 2016-11-01 DIAGNOSIS — C3490 Malignant neoplasm of unspecified part of unspecified bronchus or lung: Secondary | ICD-10-CM | POA: Diagnosis present

## 2016-11-01 DIAGNOSIS — C787 Secondary malignant neoplasm of liver and intrahepatic bile duct: Secondary | ICD-10-CM

## 2016-11-01 DIAGNOSIS — Z5112 Encounter for antineoplastic immunotherapy: Secondary | ICD-10-CM

## 2016-11-01 DIAGNOSIS — C3411 Malignant neoplasm of upper lobe, right bronchus or lung: Secondary | ICD-10-CM

## 2016-11-01 LAB — CBC WITH DIFFERENTIAL/PLATELET
BASOS ABS: 0 10*3/uL (ref 0.0–0.1)
Basophils Relative: 0 %
Eosinophils Absolute: 0.2 10*3/uL (ref 0.0–0.7)
Eosinophils Relative: 4 %
HEMATOCRIT: 36.3 % — AB (ref 39.0–52.0)
HEMOGLOBIN: 12.2 g/dL — AB (ref 13.0–17.0)
LYMPHS PCT: 16 %
Lymphs Abs: 0.8 10*3/uL (ref 0.7–4.0)
MCH: 30.2 pg (ref 26.0–34.0)
MCHC: 33.6 g/dL (ref 30.0–36.0)
MCV: 89.9 fL (ref 78.0–100.0)
MONO ABS: 0.8 10*3/uL (ref 0.1–1.0)
MONOS PCT: 16 %
NEUTROS ABS: 3.3 10*3/uL (ref 1.7–7.7)
Neutrophils Relative %: 64 %
Platelets: 191 10*3/uL (ref 150–400)
RBC: 4.04 MIL/uL — ABNORMAL LOW (ref 4.22–5.81)
RDW: 15.6 % — AB (ref 11.5–15.5)
WBC: 5.2 10*3/uL (ref 4.0–10.5)

## 2016-11-01 LAB — COMPREHENSIVE METABOLIC PANEL
ALK PHOS: 64 U/L (ref 38–126)
ALT: 17 U/L (ref 17–63)
AST: 22 U/L (ref 15–41)
Albumin: 3.9 g/dL (ref 3.5–5.0)
Anion gap: 7 (ref 5–15)
BILIRUBIN TOTAL: 0.5 mg/dL (ref 0.3–1.2)
BUN: 14 mg/dL (ref 6–20)
CALCIUM: 9.2 mg/dL (ref 8.9–10.3)
CO2: 28 mmol/L (ref 22–32)
Chloride: 97 mmol/L — ABNORMAL LOW (ref 101–111)
Creatinine, Ser: 1.13 mg/dL (ref 0.61–1.24)
GFR calc Af Amer: >60 mL/min (ref >60)
GFR calc non Af Amer: >60 mL/min (ref >60)
Glucose, Bld: 117 mg/dL — ABNORMAL HIGH (ref 65–99)
POTASSIUM: 3.9 mmol/L (ref 3.5–5.1)
Sodium: 132 mmol/L — ABNORMAL LOW (ref 135–145)
TOTAL PROTEIN: 7.5 g/dL (ref 6.5–8.1)

## 2016-11-01 LAB — TSH: TSH: 3.211 u[IU]/mL (ref 0.350–4.500)

## 2016-11-01 MED ORDER — OXYCODONE-ACETAMINOPHEN 5-325 MG PO TABS: 1.0000 | ORAL_TABLET | ORAL | 0 refills | Status: DC | PRN

## 2016-11-01 MED ORDER — SODIUM CHLORIDE 0.9 % IV SOLN
INTRAVENOUS | Status: DC
  Administered 2016-11-01: 10:00:00 via INTRAVENOUS

## 2016-11-01 MED ORDER — NIVOLUMAB CHEMO INJECTION 100 MG/10ML: 1.0000 mg/kg | Freq: Once | INTRAVENOUS | Status: DC

## 2016-11-01 MED ORDER — NIVOLUMAB CHEMO INJECTION 100 MG/10ML
1.0000 mg/kg | Freq: Once | INTRAVENOUS | Status: AC
  Administered 2016-11-01: 90 mg via INTRAVENOUS
  Filled 2016-11-01: qty 9

## 2016-11-01 MED ORDER — SODIUM CHLORIDE 0.9 % IV SOLN
3.0000 mg/kg | Freq: Once | INTRAVENOUS | Status: AC
  Administered 2016-11-01: 285 mg via INTRAVENOUS
  Filled 2016-11-01: qty 17

## 2016-11-01 NOTE — Progress Notes (Signed)
Tolerated infusions w/o adverse reaction.  Alert, in no distress.  VSS.  Discharged ambulatory in c/o family.

## 2016-11-02 ENCOUNTER — Telehealth (HOSPITAL_COMMUNITY): Payer: Self-pay | Admitting: *Deleted

## 2016-11-02 NOTE — Telephone Encounter (Signed)
VM message left for 24 hr f/u post first infusion of Yervoy/Opdivo.  Instructed to call the clinic with any questions or concerns.

## 2016-11-05 ENCOUNTER — Ambulatory Visit (HOSPITAL_COMMUNITY): Payer: Self-pay

## 2016-11-07 ENCOUNTER — Other Ambulatory Visit: Payer: Self-pay | Admitting: Cardiology

## 2016-11-09 ENCOUNTER — Other Ambulatory Visit: Payer: Self-pay

## 2016-11-09 ENCOUNTER — Encounter: Payer: Self-pay | Admitting: Surgery

## 2016-11-09 DIAGNOSIS — I724 Aneurysm of artery of lower extremity: Secondary | ICD-10-CM

## 2016-11-12 ENCOUNTER — Other Ambulatory Visit (HOSPITAL_COMMUNITY): Payer: Self-pay

## 2016-11-12 ENCOUNTER — Ambulatory Visit (HOSPITAL_COMMUNITY): Payer: Self-pay

## 2016-11-12 ENCOUNTER — Ambulatory Visit (HOSPITAL_COMMUNITY): Payer: Self-pay | Admitting: Oncology

## 2016-11-13 ENCOUNTER — Encounter: Payer: Self-pay | Admitting: *Deleted

## 2016-11-13 IMAGING — MR MR HEAD WO/W CM
8 of 15 series · 26 of 48 positions shown · IV contrast (multihance)
Comparison: MRI head 06/23/2015

CLINICAL DATA: Malignant neoplasm lung. Headache. Rule out
metastatic disease

EXAM:
MRI HEAD WITHOUT AND WITH CONTRAST
TECHNIQUE: Multiplanar, multiecho pulse sequences of the brain and surrounding
structures were obtained without and with intravenous contrast.
CONTRAST:  20mL MULTIHANCE GADOBENATE DIMEGLUMINE 529 MG/ML IV SOLN

[Series 6: T2 · axial · 5.0mm · 0.60mm/px · 1 of 23 slices shown (1 of 3)]
[im 1/23]
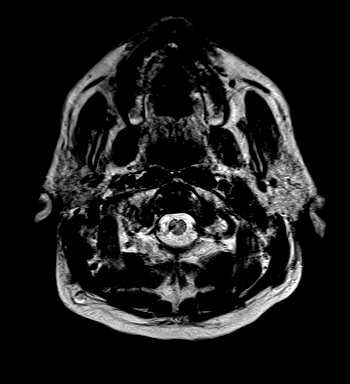

[Series 7: FLAIR · axial · 5.0mm · 0.94mm/px · z∈[-89,+51]mm · 2 of 23 slices shown]
[im 1/23]
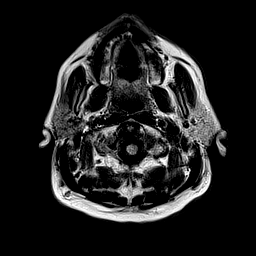
[im 23/23]
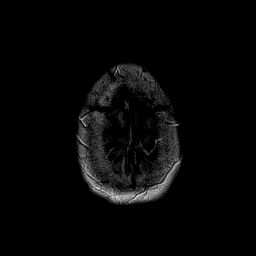

[Series 8: T1 · axial · 2.0mm · 0.45mm/px · z∈[-99,-38]mm · 3 of 95 slices shown]
[im 1/95]
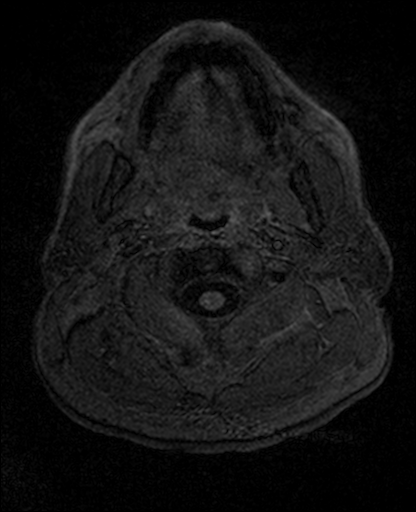
[im 16/95]
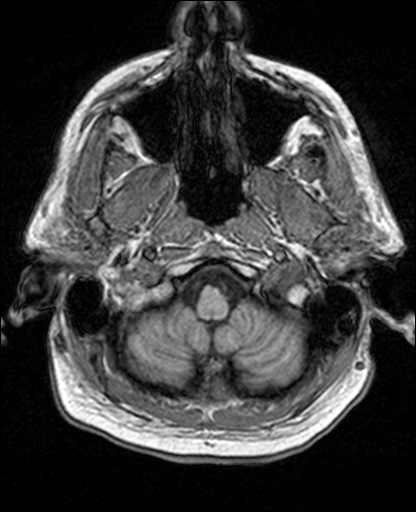
[im 32/95]
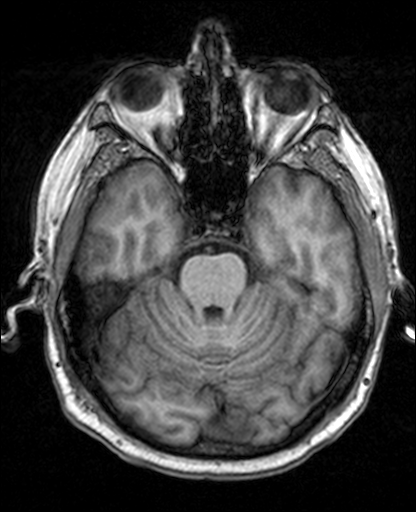

[Series 10: T2 · axial · 5.0mm · 0.75mm/px · z∈[-89,+51]mm · 2 of 23 slices shown (2 of 3)]
[im 1/23]
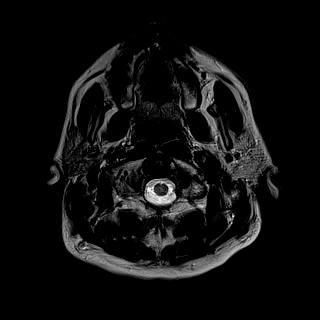
[im 23/23]
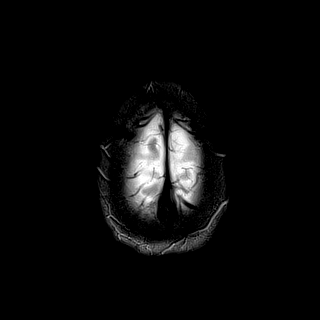

[Series 11: T2 · coronal · 5.0mm · 0.52mm/px · 2 of 26 slices shown (3 of 3)]
[im 1/26]
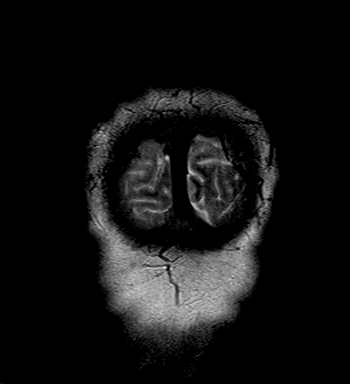
[im 26/26]
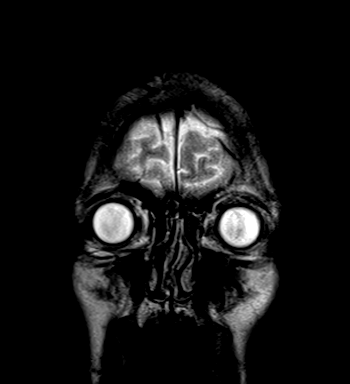

[Series 12: T1 post-contrast · axial · 2.0mm · 0.45mm/px · z∈[-99,+86]mm · 7 of 95 slices shown (1 of 3)]
[im 1/95]
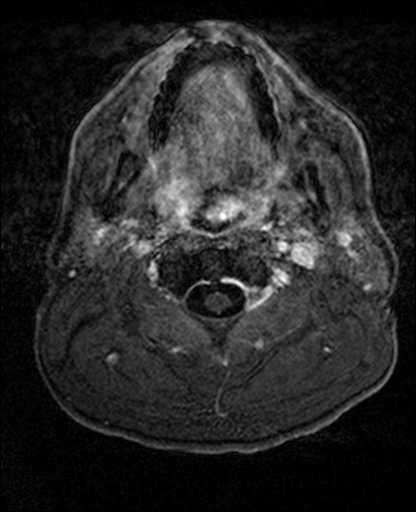
[im 16/95]
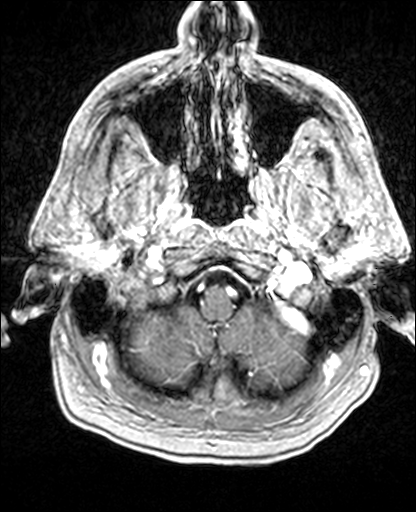
[im 32/95]
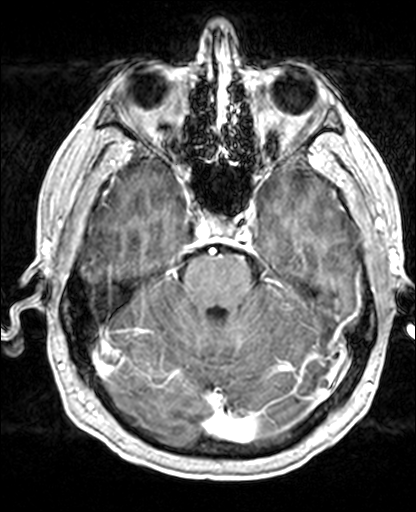
[im 48/95]
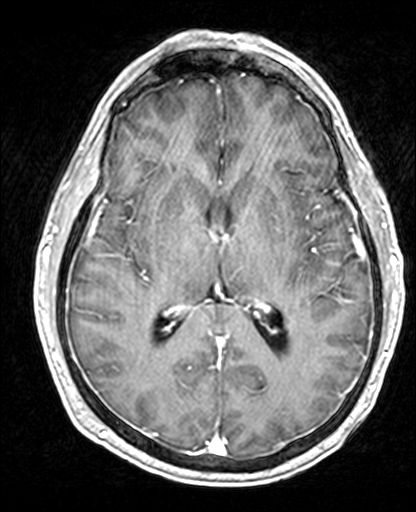
[im 63/95]
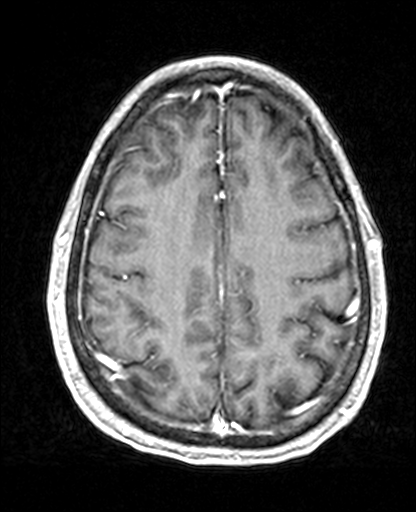
[im 79/95]
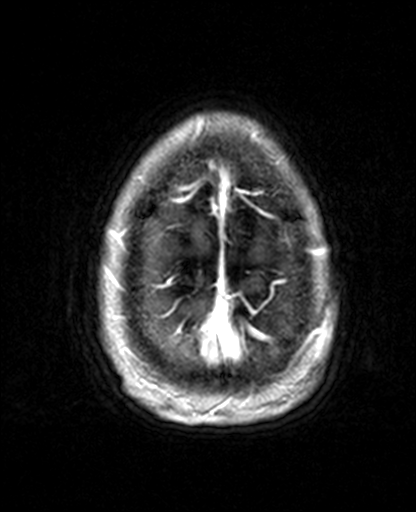
[im 95/95]
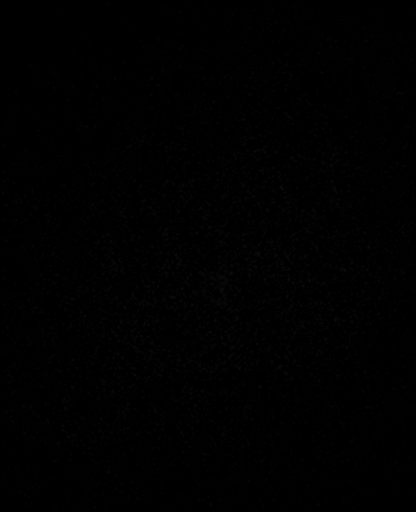

[Series 13: T1 post-contrast · coronal · 5.0mm · 0.41mm/px · 2 of 24 slices shown (2 of 3)]
[im 1/24]
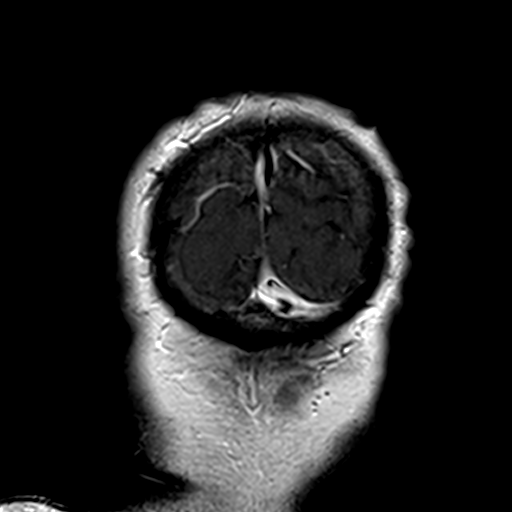
[im 24/24]
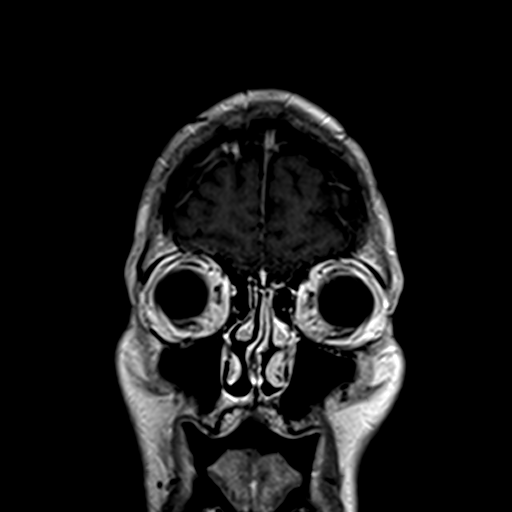

[Series 15: T1 post-contrast · axial · 2.0mm · 0.47mm/px · z∈[-99,+86]mm · 7 of 95 slices shown (3 of 3)]
[im 1/95]
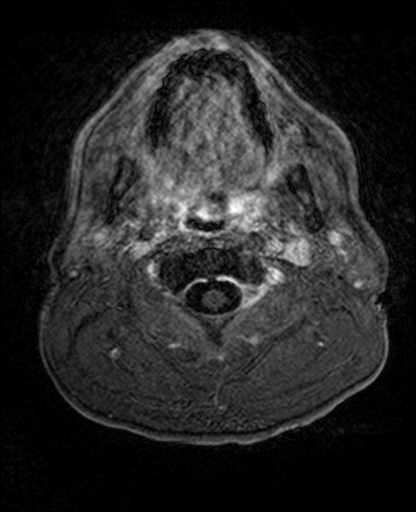
[im 16/95]
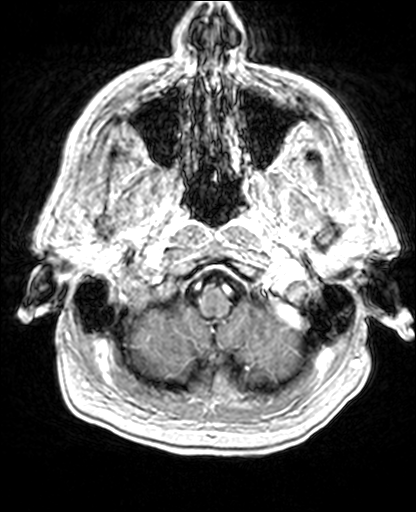
[im 32/95]
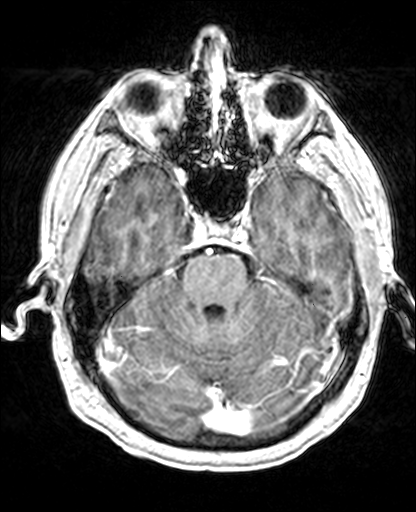
[im 48/95]
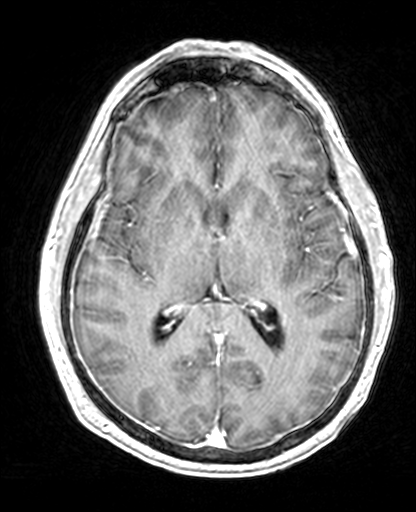
[im 63/95]
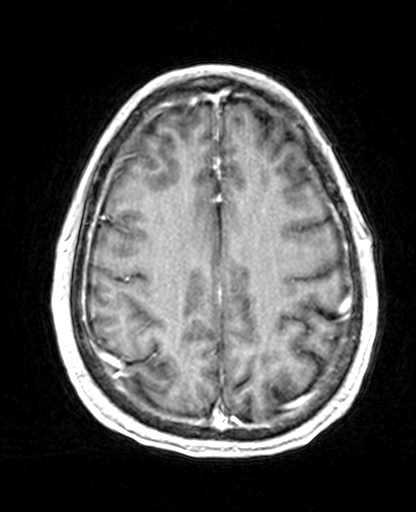
[im 79/95]
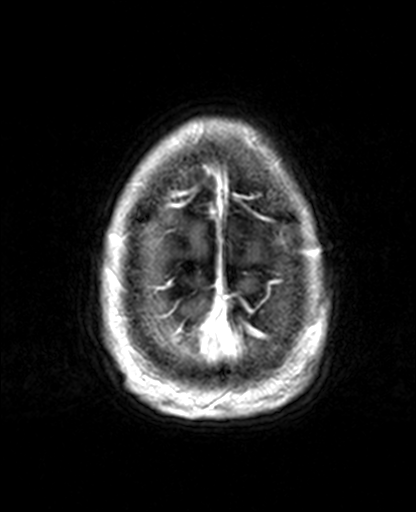
[im 95/95]
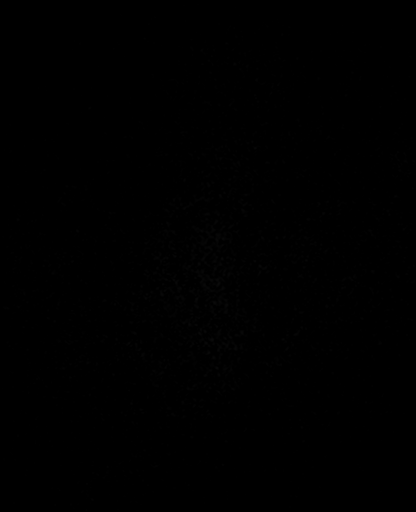

[26 of 48 positions shown; findings below may reference images not displayed]

FINDINGS: Ventricle size is normal.  Cerebral volume is normal.

Interval development of small extra-axial fluid collection around
the right hemisphere. Postcontrast imaging demonstrates mild dural
thickening and enhancement in this area. The dura on the left
appears normal in thickness.

Small focus of increased signal in the right posterior temporal lobe
on diffusion-weighted imaging. Probable restricted diffusion. This
may represent a small acute infarction. This area does not show
abnormal enhancement. Continued close followup is warranted to
exclude metastatic disease. No other areas of restricted diffusion.

Mild chronic white matter changes are stable.

Postcontrast imaging demonstrates no enhancing brain lesions. Mild
dural thickening enhancement on the right hemisphere noted.

Image quality degraded by mild motion.
IMPRESSION: 3 mm area of restricted diffusion right posterior temporal lobe
without abnormal enhancement. Favor acute infarct. Short-term MRI
follow-up is suggested to exclude metastatic disease.

Mild dural thickening enhancement over the right hemisphere which
was not present previously. This could be due to metastatic disease
and lumbar puncture suggested. If the patient has had prior head
trauma, this could be related to a subdural effusion.

## 2016-11-13 NOTE — Progress Notes (Signed)
[place] Clinical Social Work  Clinical Social Work was referred by Development worker, community for assessment of psychosocial needs due to financial concerns. Financial counselor reports to have gift card for pt when they return. Clinical Social Worker contacted patient's wife at home to offer support and assess for needs.  Wife reports they went back and applied for medicaid again. CSW also provided her with [place] financial counselor information to try to get additional assistance with medical bills. Wife reports pt has been sleeping a lot and has decreased energy. CSW encouraged her to share any concerns with the team as well. Supportive listening provided CSW to follow.     Clinical Social Work interventions: Resource follow up Supportive listening  Loren Racer, LCSW, OSW-C [place] [facility]   [phone]

## 2016-11-14 ENCOUNTER — Ambulatory Visit (HOSPITAL_COMMUNITY)
Admission: RE | Admit: 2016-11-14 | Discharge: 2016-11-14 | Disposition: A | Discharge status: Home / Self Care | Payer: Self-pay | Source: Ambulatory Visit | Attending: Oncology | Admitting: Oncology

## 2016-11-14 ENCOUNTER — Telehealth (HOSPITAL_COMMUNITY): Payer: Self-pay | Admitting: Adult Health

## 2016-11-14 ENCOUNTER — Encounter (HOSPITAL_COMMUNITY): Payer: Self-pay | Admitting: *Deleted

## 2016-11-14 ENCOUNTER — Encounter (HOSPITAL_COMMUNITY): Payer: Self-pay | Admitting: Lab

## 2016-11-14 ENCOUNTER — Other Ambulatory Visit (HOSPITAL_COMMUNITY): Payer: Self-pay | Admitting: Oncology

## 2016-11-14 DIAGNOSIS — C3490 Malignant neoplasm of unspecified part of unspecified bronchus or lung: Secondary | ICD-10-CM

## 2016-11-14 DIAGNOSIS — C779 Secondary and unspecified malignant neoplasm of lymph node, unspecified: Secondary | ICD-10-CM | POA: Insufficient documentation

## 2016-11-14 DIAGNOSIS — C787 Secondary malignant neoplasm of liver and intrahepatic bile duct: Secondary | ICD-10-CM | POA: Insufficient documentation

## 2016-11-14 NOTE — Telephone Encounter (Signed)
Patient's daughter Luetta Nutting requested phone call from me to discuss her father's disease and prognosis.   I reiterated that there was severe progression noted on his most recent CT scan, with enlargement of the lung mass (now obstructing the mainstem bronchus), lymph nodes, and liver mets.  This, of course, is not good news prognostically.  He now has hemoptysis, which is concerning as well.  Reviewed scan with Amber via phone. She is aware that hospice has been initiated, which we think is very appropriate.  Unfortunately, there are no other treatment options for Mr. Bogosian at this time.    Amber asked specifically about our projections for life expectancy. Shared with her that we suspect he has weeks, but not months.  Also discussed the possibility of the mass continuing to erode into vessels in the chest, which may cause significant bleeding before he dies.  Encouraged her to discuss with her family these possible symptoms. Hospice will help with anticipating some of these symptoms and managing them.    She voiced understanding and appreciation for my call. Encouraged her to call us with any additional questions/concerns. She shared how grateful she and her family have been to the [facility] team.  It has been a pleasure to care for Mr. Westall and his family.    Mike Craze, NP [place] [phone]

## 2016-11-14 NOTE — Patient Instructions (Signed)
Patient's wife came into clinic to pick up a [employer] gift card and asked if [name] should be coughing up blood. I told her no and that I wanted him to come up from the parking lot and be checked out. I walked to parking lot with her and told [name] I wanted to check his vitals and let our PA Gershon Mussel know what was going on. Vitals stable. Patient reports coughing up blood 2-4 times in the past 3-4 days, increase in dyspnea, increase in coughing. Tom ordered a stat CT of chest. Results revealed that his cancer had progressed in the liver, chest lymph nodes, tumor in chest had grown, and he had a complete occlusion of the right bronchus. Dr. Talbert Cage given CT results for review. Dr. Talbert Cage said that we needed to stop treatment because it wasn't working and that he needed to be made Hospice. She said he had weeks to live due to the cancer growing and the fact that his right bronchus was occluded. I sat down with patient, wife, and daughter and went over this information. He agreed to go on Hospice. They asked if he could potentially suffocate as well as die from bleeding out (from lungs). I told them that this was a possibility. I educated them about Hospice and how they could help manage him and keep him comfortable at home in regards to pain, coughing, and breathing. They were tearful. [name] hugged me and said thank you for everything y'all have done and asked that I tell everyone else thank you too. We referred patient to [facility]. Elzie Rings NP and Sherril Cong aware that patient was referred to Hospice. Gretchen NP spoke with another family member - daughter - after patient had left facility.

## 2016-11-14 NOTE — Progress Notes (Unsigned)
Referral to [facility]. Records faxed on 7/25

## 2016-11-15 ENCOUNTER — Telehealth (HOSPITAL_COMMUNITY): Payer: Self-pay | Admitting: Oncology

## 2016-11-15 NOTE — Telephone Encounter (Signed)
CALLED BMS SPOKE TO SHEMEKA ADVISED HER THAT PT HAS REQUESTED HOSPICE AND TO DISCONTINUE OPDIVO AND YERVOY.

## 2016-11-16 ENCOUNTER — Telehealth (HOSPITAL_COMMUNITY): Payer: Self-pay

## 2016-11-16 ENCOUNTER — Encounter (HOSPITAL_COMMUNITY): Payer: Self-pay | Admitting: Adult Health

## 2016-11-16 ENCOUNTER — Other Ambulatory Visit (HOSPITAL_COMMUNITY): Payer: Self-pay | Admitting: Adult Health

## 2016-11-16 ENCOUNTER — Telehealth: Payer: Self-pay

## 2016-11-16 DIAGNOSIS — C3490 Malignant neoplasm of unspecified part of unspecified bronchus or lung: Secondary | ICD-10-CM

## 2016-11-16 DIAGNOSIS — C787 Secondary malignant neoplasm of liver and intrahepatic bile duct: Secondary | ICD-10-CM

## 2016-11-16 MED ORDER — OXYCODONE-ACETAMINOPHEN 5-325 MG PO TABS: 1.0000 | ORAL_TABLET | ORAL | 0 refills | Status: DC | PRN
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
## 2016-11-16 NOTE — Telephone Encounter (Signed)
Pearson Forster RN from [facility] called with request from Dr Rosezetta Schlatter. Pt was seeing Dr Whitney Muse then transitioned to Dr Talbert Cage. Pt is still willing to have chemo treatment if possible. Dr Sonny Dandy is asking Dr Julien Nordmann to see the pt sometime next week for a second opinion before pt admitted to hospice.   Ron made aware Dr Julien Nordmann not in until Monday. Note routed to [facility] and copy placed on Dr [employer].
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
## 2016-11-19 ENCOUNTER — Encounter: Payer: Self-pay | Admitting: *Deleted

## 2016-11-19 ENCOUNTER — Inpatient Hospital Stay (HOSPITAL_COMMUNITY): Admission: RE | Admit: 2016-11-19 | Payer: Self-pay | Source: Ambulatory Visit

## 2016-11-19 ENCOUNTER — Telehealth: Payer: Self-pay | Admitting: *Deleted

## 2016-11-19 ENCOUNTER — Ambulatory Visit: Payer: Self-pay | Admitting: Surgery

## 2016-11-19 ENCOUNTER — Ambulatory Visit: Payer: Medicaid Other | Admitting: Surgery

## 2016-11-19 DIAGNOSIS — C3490 Malignant neoplasm of unspecified part of unspecified bronchus or lung: Secondary | ICD-10-CM

## 2016-11-19 NOTE — Telephone Encounter (Signed)
Oncology Nurse Navigator Documentation  Oncology Nurse Navigator Flowsheets 11/19/2016  Navigator Location [facility]  Navigator Encounter Type Telephone/I received a call from Hospice. Patient is upset regarding care at [facility].  I listened as he explained. Patient would like to see Dr. Julien Nordmann for a second appt. I updated Dr. Julien Nordmann. He states he can see this Thursday. I called patient and he requested I speak with his wife.  I updated her on appt on 11/22/16.  She verbalized understanding of appt time and place.  Telephone Outgoing Call  Treatment Phase Treatment  Barriers/Navigation Needs Coordination of Care  Interventions Coordination of Care  Coordination of Care Appts  Acuity Level 2  Acuity Level 2 Assistance expediting appointments  Time Spent with Patient 30

## 2016-11-22 ENCOUNTER — Encounter: Payer: Self-pay | Admitting: Internal Medicine

## 2016-11-22 ENCOUNTER — Other Ambulatory Visit (HOSPITAL_COMMUNITY): Payer: Self-pay

## 2016-11-22 ENCOUNTER — Ambulatory Visit (HOSPITAL_COMMUNITY): Payer: Self-pay

## 2016-11-22 ENCOUNTER — Other Ambulatory Visit (HOSPITAL_BASED_OUTPATIENT_CLINIC_OR_DEPARTMENT_OTHER): Payer: Self-pay

## 2016-11-22 ENCOUNTER — Ambulatory Visit (HOSPITAL_COMMUNITY): Payer: Self-pay | Admitting: Adult Health

## 2016-11-22 ENCOUNTER — Ambulatory Visit (HOSPITAL_BASED_OUTPATIENT_CLINIC_OR_DEPARTMENT_OTHER): Payer: Self-pay | Admitting: Internal Medicine

## 2016-11-22 ENCOUNTER — Ambulatory Visit (HOSPITAL_COMMUNITY): Payer: Self-pay | Admitting: Oncology

## 2016-11-22 VITALS — BP 128/74 | HR 101 | Temp 98.1°F | Resp 19 | Ht 69.0 in | Wt 198.1 lb

## 2016-11-22 DIAGNOSIS — C3490 Malignant neoplasm of unspecified part of unspecified bronchus or lung: Secondary | ICD-10-CM

## 2016-11-22 DIAGNOSIS — C787 Secondary malignant neoplasm of liver and intrahepatic bile duct: Secondary | ICD-10-CM

## 2016-11-22 DIAGNOSIS — E279 Disorder of adrenal gland, unspecified: Secondary | ICD-10-CM

## 2016-11-22 DIAGNOSIS — R079 Chest pain, unspecified: Secondary | ICD-10-CM

## 2016-11-22 DIAGNOSIS — K869 Disease of pancreas, unspecified: Secondary | ICD-10-CM

## 2016-11-22 DIAGNOSIS — Z720 Tobacco use: Secondary | ICD-10-CM

## 2016-11-22 DIAGNOSIS — C771 Secondary and unspecified malignant neoplasm of intrathoracic lymph nodes: Secondary | ICD-10-CM

## 2016-11-22 DIAGNOSIS — C3411 Malignant neoplasm of upper lobe, right bronchus or lung: Secondary | ICD-10-CM

## 2016-11-22 DIAGNOSIS — Z5111 Encounter for antineoplastic chemotherapy: Secondary | ICD-10-CM

## 2016-11-22 DIAGNOSIS — R634 Abnormal weight loss: Secondary | ICD-10-CM

## 2016-11-22 DIAGNOSIS — Z7189 Other specified counseling: Secondary | ICD-10-CM

## 2016-11-22 DIAGNOSIS — R0602 Shortness of breath: Secondary | ICD-10-CM

## 2016-11-22 LAB — CBC WITH DIFFERENTIAL/PLATELET
BASO%: 1 % (ref 0.0–2.0)
Basophils Absolute: 0 10*3/uL (ref 0.0–0.1)
EOS%: 7.7 % — ABNORMAL HIGH (ref 0.0–7.0)
Eosinophils Absolute: 0.3 10*3/uL (ref 0.0–0.5)
HCT: 34.6 % — ABNORMAL LOW (ref 38.4–49.9)
HGB: 11.7 g/dL — ABNORMAL LOW (ref 13.0–17.1)
LYMPH%: 19.8 % (ref 14.0–49.0)
MCH: 29.5 pg (ref 27.2–33.4)
MCHC: 33.7 g/dL (ref 32.0–36.0)
MCV: 87.7 fL (ref 79.3–98.0)
MONO#: 0.5 10*3/uL (ref 0.1–0.9)
MONO%: 11.4 % (ref 0.0–14.0)
NEUT#: 2.7 10*3/uL (ref 1.5–6.5)
NEUT%: 60.1 % (ref 39.0–75.0)
PLATELETS: 191 10*3/uL (ref 140–400)
RBC: 3.95 10*6/uL — AB (ref 4.20–5.82)
RDW: 16.3 % — ABNORMAL HIGH (ref 11.0–14.6)
WBC: 4.5 10*3/uL (ref 4.0–10.3)
lymph#: 0.9 10*3/uL (ref 0.9–3.3)

## 2016-11-22 LAB — COMPREHENSIVE METABOLIC PANEL
ALT: 23 U/L (ref 0–55)
ANION GAP: 8 meq/L (ref 3–11)
AST: 25 U/L (ref 5–34)
Albumin: 3.4 g/dL — ABNORMAL LOW (ref 3.5–5.0)
Alkaline Phosphatase: 110 U/L (ref 40–150)
BUN: 11.7 mg/dL (ref 7.0–26.0)
CHLORIDE: 97 meq/L — AB (ref 98–109)
CO2: 30 meq/L — AB (ref 22–29)
CREATININE: 1.1 mg/dL (ref 0.7–1.3)
Calcium: 9.2 mg/dL (ref 8.4–10.4)
EGFR: 78 mL/min/{1.73_m2} — AB (ref >90)
Glucose: 126 mg/dl (ref 70–140)
Potassium: 3.9 mEq/L (ref 3.5–5.1)
Sodium: 134 mEq/L — ABNORMAL LOW (ref 136–145)
Total Bilirubin: 0.47 mg/dL (ref 0.20–1.20)
Total Protein: 7.4 g/dL (ref 6.4–8.3)

## 2016-11-22 NOTE — Progress Notes (Signed)
[name] [phone]   [phone] Multidisciplinary thoracic oncology clinic  CONSULT NOTE  REFERRING PHYSICIAN: Dr. Rosezetta [name]  REASON FOR CONSULTATION:  56 years old white male with history of small cell lung cancer for a second opinion.  HPI [name] is a 56 y.o. male was past medical history significant for COPD, abdominal aortic aneurysm, chronic heart disease, dyslipidemia, myocardial infarction and extensive stage small cell lung cancer diagnosed in September 2016. The patient mentioned that in September 2016 he presented to the emergency Department complaining of cough and right flank pain. Chest x-ray at that time showed 6.8 x 7.6 cm right hilar mass most compatible with bronchogenic carcinoma. This was followed by CT scan of the chest on 01/18/2015 and it showed 8.7 x 8.7 cm right hilar mass encasing the right pulmonary artery and occluding the right upper lobe bronchus. Primary bronchogenic carcinoma was favored over metastatic disease. There was also seen 0.4 x 2.6 cm mass in the body of the pancreas partially visible and questionable for metastatic disease versus pancreatic adenocarcinoma. MRI of the brain at that time showed no evidence of metastatic disease to the brain. A PET scan on 01/26/2017 showed large hypermetabolic right hilar mass with mediastinal invasion and infrahilar extension, maximum SUV 13.0. There was patchy opacity with increased activity in the apical segment of the right upper lobe and especially the periphery along the posterior inferior margin of the right upper lobe, potentially from tumor extension or postobstructive pneumonitis. There was pancreatic tail mass approximately 4.5 cm in diameter questionable metastatic or primary pancreatic adenocarcinoma. There was no liver metastatic lesion identified. The patient also has abdominal aortic aneurysm measuring 5.8 cm in diameter at that time. He was seen by Dr. Madolyn [name]  and biopsy of the right upper lobe lung mass was consistent with a small cell carcinoma. The patient was seen by Dr. Whitney [name] at [facility]. He was started on systemic chemotherapy initially with one cycle of cisplatin and etoposide which was not tolerated well and his treatment was changed to carboplatin and etoposide for 3 more cycles completed in December 2016. He also received 2 more cycles of treatment with carboplatin and irinotecan after the patient developed skin toxicity to the etoposide. He has good response to this treatment and was on observation until July 2017 when CT scan of the chest, abdomen and pelvis on 11/11/2015 showed interval development of 1.6 x 1.4 cm left adrenal nodule suspicious for metastatic disease. The patient underwent palliative radiotherapy to the left adrenal mass under the care of Dr. Lisbeth [name] and repeat imaging studies on 01/18/2016 showed improvement of this area. The patient also received brain radiation at that time. Imaging studies on 07/20/2016 showed evidence for disease progression with 9.5 x 4.6 cm central right upper lobe mass extending to the perihilar region, new compatible with lung cancer recurrence with associated subpleural nodularity in the right upper lobe suspicious for metastatic disease. There was also associated mild mediastinal lymphadenopathy compatible with nodal metastases. The patient also had multifocal hepatic lesions in both lobes new compatible with hepatic metastasis.  He was restarted again on systemic chemotherapy with carboplatin and irinotecan for 2 cycles but unfortunately his restaging scan on 09/28/2016 showed further evidence of disease progression with increase in the size of the large right upper lobe mass extending into the right hilum as well as increase in the size of the multiple hepatic metastatic lesions and a stable to mild interval decrease in the  size of the mediastinal lymph nodes. The patient was started on treatment  with immunotherapy off protocol with [name] and [name] for 1 cycle but this was discontinued after the patient presented with hemoptysis on 11/14/2016. He was referred to hospice at that point but the patient and his family were very upset because no one of the providers had discussed the result of the last scan with them and reason for hospice referral. Therefore seen by the director of the hospice service and their [place] Dr. Sonny [name]. The patient and his family were interested in seeking second opinion for discussion of further treatment options if possible. Dr. Sonny [name] kindly referred the patient to the clinic today for evaluation and discussion of this option. When seen today the patient continues to have shortness breath as well as right-sided chest pain and cough with occasional hemoptysis. He also lost several pounds in the last few weeks. He has intermittent headache since March 2018. He denied having any current nausea, vomiting, diarrhea or constipation. He has no fever or chills. Family history unremarkable for malignancy. Mother and father are still alive. The patient is married and has 3 daughters and 2 sons. He was accompanied today by his wife [name] and his 2 daughters [name] and [name]. The patient used to work in [employer]. He has a history of smoking more than one pack per day for over 40 years and unfortunately he continues to smoke a few cigarettes every day. He also has a history of alcohol abuse in the past but not recently. No history of drug abuse.  HPI  Past Medical History:  Diagnosis Date  . AAA (abdominal aortic aneurysm) ([place])   . Arthritis   . COPD (chronic obstructive pulmonary disease) ([place])   . Coronary artery disease 2002   Lesion in LAD s/p angioplasty; s/p CABG x 4 Sept 2013  . Essential hypertension, benign   . Hyperlipidemia   . Myocardial infarct ([place]) 1999, 2002  . Pancreatitis   . Small cell lung cancer ([place])    Stage IIIB with extension the  pancreas    Past Surgical History:  Procedure Laterality Date  . ABDOMINAL AORTIC ENDOVASCULAR STENT GRAFT N/A 08/11/2015   Procedure: ABDOMINAL AORTIC ENDOVASCULAR STENT GRAFT;  Surgeon: Serafina Mitchell, MD;  Location: [facility] OR;  Service: Vascular;  Laterality: N/A;  . [place], 2002  . CORONARY ARTERY BYPASS GRAFT  01/02/2012   Procedure: CORONARY ARTERY BYPASS GRAFTING (CABG);  Surgeon: [name], MD;  Location: [place];  Service: Open Heart Surgery;  Laterality: N/A;  times three using left internal mammary artery and right endoscopically harvested saphenous vein  . EUS N/A 02/10/2015   Procedure: UPPER ENDOSCOPIC ULTRASOUND (EUS) LINEAR;  Surgeon: [name], MD;  Location: [facility];  Service: Endoscopy;  Laterality: N/A;  . KNEE SURGERY Left    ACL repair  . LEFT HEART CATHETERIZATION WITH CORONARY ANGIOGRAM N/A 01/01/2012   Procedure: LEFT HEART CATHETERIZATION WITH CORONARY ANGIOGRAM;  Surgeon: Hillary Bow, MD;  Location: [facility] CATH LAB;  Service: Cardiovascular;  Laterality: N/A;  . LUNG BIOPSY N/A 01/26/2015   Procedure: LUNG BIOPSY;  Surgeon: [name], MD;  Location: [place];  Service: Thoracic;  Laterality: N/A;  . LYMPH NODE BIOPSY N/A 01/26/2015   Procedure: LYMPH NODE BIOPSY;  Surgeon: [name], MD;  Location: [place];  Service: Thoracic;  Laterality: N/A;  . MULTIPLE EXTRACTIONS WITH ALVEOLOPLASTY  02/21/2012   Procedure: MULTIPLE EXTRACION WITH  ALVEOLOPLASTY;  Surgeon: Lenn Cal, DDS;  Location: [facility] ORS;  Service: Oral Surgery;  Laterality: N/A;  Extaction of tooth #'s 4,5,6,7,10,11,12,13,14,15,19,20,21,22,23,24,25,   . PORTACATH PLACEMENT Left 02/01/2015   Procedure: ATTEMPTED INSERTION OF PORT-A-CATH;  Surgeon: [name], MD;  Location: [place];  Service: Thoracic;  Laterality: Left;  . TEE WITHOUT CARDIOVERSION  01/02/2012   Procedure: TRANSESOPHAGEAL ECHOCARDIOGRAM (TEE);  Surgeon: [name], MD;  Location: [place];  Service: Open Heart Surgery;  Laterality: N/A;  . VIDEO BRONCHOSCOPY WITH ENDOBRONCHIAL ULTRASOUND N/A 01/26/2015   Procedure: VIDEO BRONCHOSCOPY WITH ENDOBRONCHIAL ULTRASOUND;  Surgeon: [name], MD;  Location: [facility] OR;  Service: Thoracic;  Laterality: N/A;    Family History  Problem Relation Age of Onset  . Heart disease Father   . Diabetes Father   . Hyperlipidemia Father   . Hypertension Father   . Hyperlipidemia Mother   . Hypertension Mother   . Stroke Mother   . AAA (abdominal aortic aneurysm) Mother   . Diabetes Paternal Grandmother   . Cancer Neg Hx     Social History Social History  Substance Use Topics  . Smoking status: Current Every Day Smoker    Packs/day: 2.00    Years: 35.00    Types: Cigarettes    Start date: 06/30/1976  . Smokeless tobacco: Never Used  . Alcohol use No     Comment: Maybe a 12 pack/mth if that - very rare since 2013    Allergies  Allergen Reactions  . Lisinopril Cough  . Tea Hives    Current Outpatient Prescriptions  Medication Sig Dispense Refill  . amLODipine (NORVASC) 5 MG tablet TAKE ONE (1) TABLET BY MOUTH EVERY DAY 90 tablet 3  . aspirin EC 81 MG tablet Take 81 mg by mouth daily.    . carvedilol (COREG) 6.25 MG tablet TAKE ONE TABLET BY MOUTH TWICE A DAY WITH A MEAL 180 tablet 1  . dextromethorphan (DELSYM) 30 MG/5ML liquid Take by mouth as needed for cough.    Marland Kitchen oxyCODONE-acetaminophen (PERCOCET/ROXICET) 5-325 MG tablet Take 1 tablet by mouth every 4 (four) hours as needed for severe pain. 30 tablet 0  . pravastatin (PRAVACHOL) 40 MG tablet TAKE ONE (1) TABLET BY MOUTH EVERY DAY 90 tablet 3  . Ipilimumab (YERVOY IV) Inject into the vein. Every 3 weeks    . Nivolumab ([name] IV) Inject into the vein. Every 3 weeks    . ondansetron (ZOFRAN) 8 MG tablet Take 1 tablet (8 mg total) by mouth 2 (two) times daily as needed for refractory nausea / vomiting. Start on day 3 after chemotherapy. (Patient not taking: Reported on  11/22/2016) 30 tablet 1  . prochlorperazine (COMPAZINE) 10 MG tablet Take 1 tablet (10 mg total) by mouth every 6 (six) hours as needed (NAUSEA). (Patient not taking: Reported on 11/22/2016) 30 tablet 1   No current facility-administered medications for this visit.     Review of Systems  Constitutional: positive for fatigue and weight loss Eyes: negative Ears, nose, mouth, throat, and face: negative Respiratory: positive for cough, dyspnea on exertion, hemoptysis and pleurisy/chest pain Cardiovascular: negative Gastrointestinal: negative Genitourinary:negative Integument/breast: negative Hematologic/lymphatic: negative Musculoskeletal:negative Neurological: negative Behavioral/Psych: negative Endocrine: negative Allergic/Immunologic: negative  Physical Exam  UPJ:SRPRX, healthy, no distress, well nourished, well developed and anxious SKIN: skin color, texture, turgor are normal, no rashes or significant lesions HEAD: Normocephalic, No masses, lesions, tenderness or abnormalities EYES: normal, PERRLA, Conjunctiva are pink and non-injected EARS: External ears normal,  Canals clear OROPHARYNX:no exudate, no erythema and lips, buccal mucosa, and tongue normal  NECK: supple, no adenopathy, no JVD LYMPH:  no palpable lymphadenopathy, no hepatosplenomegaly LUNGS: coarse sounds heard, good diaphragmatic excursion, prolonged expiratory phase HEART: regular rate & rhythm and no murmurs ABDOMEN:abdomen soft, non-tender, normal bowel sounds and no masses or organomegaly BACK: No CVA tenderness, Range of motion is normal EXTREMITIES:no joint deformities, effusion, or inflammation, no edema, no skin discoloration  NEURO: alert & oriented x 3 with fluent speech, no focal motor/sensory deficits  PERFORMANCE STATUS: ECOG 1  LABORATORY DATA: Lab Results  Component Value Date   WBC 4.5 11/22/2016   HGB 11.7 (L) 11/22/2016   HCT 34.6 (L) 11/22/2016   MCV 87.7 11/22/2016   PLT 191 11/22/2016       Chemistry      Component Value Date/Time   NA 134 (L) 11/22/2016 1517   K 3.9 11/22/2016 1517   CL 97 (L) 11/01/2016 0817   CO2 30 (H) 11/22/2016 1517   BUN 11.7 11/22/2016 1517   CREATININE 1.1 11/22/2016 1517      Component Value Date/Time   CALCIUM 9.2 11/22/2016 1517   ALKPHOS 110 11/22/2016 1517   AST 25 11/22/2016 1517   ALT 23 11/22/2016 1517   BILITOT 0.47 11/22/2016 1517       RADIOGRAPHIC STUDIES: Ct Chest Wo Contrast  Result Date: 11/14/2016 CLINICAL DATA:  Hemoptysis.  Small cell lung cancer. EXAM: CT CHEST WITHOUT CONTRAST TECHNIQUE: Multidetector CT imaging of the chest was performed following the standard protocol without IV contrast. COMPARISON:  09/28/2016 FINDINGS: Cardiovascular: Previous median sternotomy and CABG procedure. Aortic atherosclerosis. Normal heart size. No pericardial effusion. Mediastinum/Nodes: Progression of extensive mediastinal adenopathy. Index left-sided pre-vascular node measures 1.5 cm, image number 47 of series 2. Previously 0.9 cm. Index low right paratracheal node measures 2.3 cm, image 53 of series 2. Previously 1.2 cm. Index sub- carinal node is now white large mass measuring 3.8 cm, image 62 of series 2. Previously 1.1 cm. Lungs/Pleura: Large right upper lobe mass with surrounding postobstructive consolidation now measures 8.4 x 12.8 by 8.7 cm, image 49 of series 2. Previously 10.1 x 8.5 x 5.4 cm. There is now complete occlusion of the right mainstem bronchus. There is a new small right pleural effusion. Patchy nodular densities in the right lower lobe are nonspecific. Upper Abdomen: Extensive multifocal liver metastases have progressed in the interval. Segment 8 lesion measures 3.1 cm, image number 109 of series 2. Previously 1.3 cm. Lateral segment of left lobe of liver lesion measures 3.2 cm, image 119 of series 2. Previous 1.8 cm. Posterior right lobe of liver lesion measures 3.5 cm, image 156 of series 2. Previously 1.2 cm. The  adrenal glands appear normal. Musculoskeletal: No aggressive lytic or sclerotic bone lesions. IMPRESSION: 1. Marked interval progression of disease within the chest and upper abdomen. The large obstructing mass within the right upper lobe has increased in size in the interval. There is now complete occlusion of the right mainstem bronchus. 2. Progression of nodal metastasis within the chest 3. Progression of liver metastases. Electronically Signed   By: Kerby Moors M.D.   On: 11/14/2016 12:24    ASSESSMENT: This is a very pleasant 56 years old white male with history of extensive stage small cell lung cancer diagnosed in September 2016 status post systemic chemotherapy with carboplatin and etoposide completed in December 2016. He also received 2 doses of carboplatin and irinotecan discontinued February 2017. The patient had  disease recurrence in March 2018. He was treated with 2 more cycles with carboplatin and irinotecan with no response followed by 1 cycle of treatment with Yervoy and [name].  The patient was then referred to hospice care. He and his family are not in agreement with the decision and seeking more treatment options.  PLAN: I had a lengthy discussion with the patient and his family today about his current disease stage, prognosis and treatment options. I personally and independently reviewed the scan images and discuss the results and showed the images to the patient and his family. I also spent significant amount of time reviewing his previous records and treatments. I discussed with the patient the goals of care and his other options for treatment at this point. I explained to the patient that he has incurable condition and orders the treatment are off palliative nature. I also explained to the patient that he has done extremely well with his current diagnosis of extensive stage small cell lung cancer almost 2 years ago and his team at [facility] had provided him with good  care. I also discussed with the patient his current prognosis with and without treatment. I told the patient and his wife that referral to palliative care and hospice is a very reasonable option at this point but the patient and his family would like to try any more option that could extend his life little bit longer and also to improve his symptoms. I discussed with the patient several options including repeat treatment with etoposide-containing regimen understanding that the patient had developed some skin toxicities at that time but we could treat him with a lower dose of etoposide. I would consider him for reduced dose carboplatin and etoposide versus oral etoposide versus oral Temodar. The patient may also benefit from palliative radiotherapy to the large right upper lobe lung mass for improvement of his symptoms if he has not received palliative radiation to this area and the past. I spoke to Dr. Lisbeth [name] and he will look at the previous radiation field to see if the patient could benefit from additional treatment to that area. I also spoke to Mike Craze, NP at [facility] to see if the patient can resume his treatment close to home. Unfortunately I received an email from [facility] the next day after she discussed the case with Dr. Talbert Cage, indicating that they will not be able to provide the patient with any additional treatment close to home and the only option they can provide for him is hospice. I will arrange for the patient to receive his treatment with reduced dose carboplatin and etoposide in [place] within the next 1-2 weeks depending on whether the patient would receive radiation to the chest or not. The patient and his family agreed to the current plan. For smoking cessation, I strongly encouraged the patient to quit smoking. The patient voices understanding of current disease status and treatment options and is in agreement with the current care plan. All questions were  answered. The patient knows to call the clinic with any problems, questions or concerns. We can certainly see the patient much sooner if necessary.  Thank you so much for allowing me to participate in the care of [name]. I will continue to follow up the patient with you and assist in his care.  I spent 55 minutes counseling the patient face to face. The total time spent in the appointment was 80 minutes.  Disclaimer: This note was dictated with voice recognition  software. Similar sounding words can inadvertently be transcribed and may not be corrected upon review.   Ramsey Midgett K. November 22, 2016, 4:01 PM

## 2016-11-22 NOTE — Patient Instructions (Signed)
Steps to Quit Smoking Smoking tobacco can be bad for your health. It can also affect almost every organ in your body. Smoking puts you and people around you at risk for many serious long-lasting (chronic) diseases. Quitting smoking is hard, but it is one of the best things that you can do for your health. It is never too late to quit. What are the benefits of quitting smoking? When you quit smoking, you lower your risk for getting serious diseases and conditions. They can include:  Lung cancer or lung disease.  Heart disease.  Stroke.  Heart attack.  Not being able to have children (infertility).  Weak bones (osteoporosis) and broken bones (fractures).  If you have coughing, wheezing, and shortness of breath, those symptoms may get better when you quit. You may also get sick less often. If you are pregnant, quitting smoking can help to lower your chances of having a baby of low birth weight. What can I do to help me quit smoking? Talk with your doctor about what can help you quit smoking. Some things you can do (strategies) include:  Quitting smoking totally, instead of slowly cutting back how much you smoke over a period of time.  Going to in-person counseling. You are more likely to quit if you go to many counseling sessions.  Using resources and support systems, such as: ? Online chats with a counselor. ? Phone quitlines. ? Printed self-help materials. ? Support groups or group counseling. ? Text messaging programs. ? Mobile phone apps or applications.  Taking medicines. Some of these medicines may have nicotine in them. If you are pregnant or breastfeeding, do not take any medicines to quit smoking unless your doctor says it is okay. Talk with your doctor about counseling or other things that can help you.  Talk with your doctor about using more than one strategy at the same time, such as taking medicines while you are also going to in-person counseling. This can help make  quitting easier. What things can I do to make it easier to quit? Quitting smoking might feel very hard at first, but there is a lot that you can do to make it easier. Take these steps:  Talk to your family and friends. Ask them to support and encourage you.  Call phone quitlines, reach out to support groups, or work with a counselor.  Ask people who smoke to not smoke around you.  Avoid places that make you want (trigger) to smoke, such as: ? Bars. ? Parties. ? Smoke-break areas at work.  Spend time with people who do not smoke.  Lower the stress in your life. Stress can make you want to smoke. Try these things to help your stress: ? Getting regular exercise. ? Deep-breathing exercises. ? Yoga. ? Meditating. ? Doing a body scan. To do this, close your eyes, focus on one area of your body at a time from head to toe, and notice which parts of your body are tense. Try to relax the muscles in those areas.  Download or buy apps on your mobile phone or tablet that can help you stick to your quit plan. There are many free apps, such as QuitGuide from the CDC (Centers for Disease Control and Prevention). You can find more support from smokefree.gov and other websites.  This information is not intended to replace advice given to you by your health care provider. Make sure you discuss any questions you have with your health care provider. Document Released: 02/03/2009 Document   Revised: 12/06/2015 Document Reviewed: 08/24/2014 Elsevier Interactive Patient Education  2018 Elsevier Inc.  

## 2016-11-23 ENCOUNTER — Telehealth (HOSPITAL_COMMUNITY): Payer: Self-pay | Admitting: Adult Health

## 2016-11-23 ENCOUNTER — Telehealth: Payer: Self-pay | Admitting: *Deleted

## 2016-11-23 NOTE — Telephone Encounter (Signed)
I had a lengthy conversation with patient's wife, [name] today regarding her husband's plan of care.  After discussion with Dr. Talbert Cage, we do not feel comfortable giving patient additional chemotherapy here at [facility] based on his clinical condition and the potential for worsening symptoms/condition with treatment. They are willing to travel to [facility] to see Dr. Julien Nordmann and receive treatment per his recommendations, which we are happy to help facilitate for them.   I will reach out to Dr. Worthy Flank team and help facilitate that transfer of care and further treatment plan at [facility] in [place].   Shared with patient's wife that we certainly want to make sure that their needs are met, even if we cannot meet those needs here at [facility].    Patient's wife agreeable to this plan. His appointments at [facility] have been cancelled and we defer further treatment planning to Dr. Worthy Flank team going forward.    Mike Craze, NP Between [phone]

## 2016-11-23 NOTE — Telephone Encounter (Signed)
Oncology Nurse Navigator Documentation  Oncology Nurse Navigator Flowsheets 11/23/2016  Navigator Location [facility]  Navigator Encounter Type Telephone/I received information from Dr. Julien Nordmann that patient would like to be seen at [facility].  Dr. Julien Nordmann would like to get patient scheduled after an update on radiation.  I contacted Dr. Lisbeth Renshaw via in basket message for an update. I called patient to update that I have contacted Dr. Lisbeth Renshaw and once I get an update I will contact the patient.   Telephone Outgoing Call  Barriers/Navigation Needs Coordination of Care;Education  Education Other  Interventions Coordination of Care;Education  Coordination of Care Other  Education Method Verbal  Acuity Level 2  Time Spent with Patient 30

## 2016-11-24 ENCOUNTER — Encounter: Payer: Self-pay | Admitting: Internal Medicine

## 2016-11-24 DIAGNOSIS — Z7189 Other specified counseling: Secondary | ICD-10-CM | POA: Insufficient documentation

## 2016-11-24 DIAGNOSIS — Z5111 Encounter for antineoplastic chemotherapy: Secondary | ICD-10-CM | POA: Insufficient documentation

## 2016-11-24 HISTORY — DX: Encounter for antineoplastic chemotherapy: Z51.11

## 2016-11-24 NOTE — Progress Notes (Signed)
DISCONTINUE ON PATHWAY REGIMEN - Small Cell Lung   [id number]:Carboplatin + Irinotecan:   A cycle is every 21 days:     Irinotecan      Carboplatin   **Always confirm dose/schedule in your pharmacy ordering system**    REASON: Toxicities / Adverse Event PRIOR TREATMENT: Off Pathway: Carboplatin + Irinotecan TREATMENT RESPONSE: Stable Disease (SD)  START ON PATHWAY REGIMEN - Small Cell Lung     A cycle is every 21 days:     Etoposide      Carboplatin   **Always confirm dose/schedule in your pharmacy ordering system**    Patient Characteristics: Extensive and Limited Stage, Second Line, Relapse > 6 Months Stage Grouping: Extensive AJCC T Category: T4 AJCC N Category: N2 AJCC M Category: M1b AJCC 8 Stage Grouping: IVA Line of therapy: Second Line Would you be surprised if this patient died  in the next year? I would NOT be surprised if this patient died in the next year Time to Relapse: Relapse > 6 Months Intent of Therapy: Non-Curative / Palliative Intent, Discussed with Patient

## 2016-11-26 ENCOUNTER — Encounter: Payer: Self-pay | Admitting: Radiation Oncology

## 2016-11-27 ENCOUNTER — Telehealth: Payer: Self-pay | Admitting: *Deleted

## 2016-11-27 NOTE — Telephone Encounter (Signed)
Oncology Nurse Navigator Documentation  Oncology Nurse Navigator Flowsheets 11/27/2016  Navigator Location [facility]  Navigator Encounter Type Telephone/I received a call from [facility] in [place].  Patient will be starting on 8/13 or 8/14 with treatment.  I called them to verify.  Mr. Coppola is also set up to see Dr. Lisbeth Renshaw here on 8/14.  I notified schedulers to cancel that appt.  He is being seen closer to home.  I also notified scheduling on when to schedule chemo.  Patient should start chemo on 12/03/16.  I called patient to update. I was unable to reach but did leave a vm message regarding up coming appt.   Telephone Outgoing Call  Barriers/Navigation Needs Education;Coordination of Care  Education Other  Interventions Coordination of Care;Education  Coordination of Care Other  Education Method Verbal  Acuity Level 2  Time Spent with Patient 30

## 2016-11-28 ENCOUNTER — Ambulatory Visit (HOSPITAL_COMMUNITY): Payer: Self-pay | Admitting: Adult Health

## 2016-11-28 ENCOUNTER — Other Ambulatory Visit: Payer: Self-pay | Admitting: Cardiology

## 2016-11-29 ENCOUNTER — Encounter: Payer: Self-pay | Admitting: *Deleted

## 2016-11-29 ENCOUNTER — Telehealth: Payer: Self-pay | Admitting: Internal Medicine

## 2016-11-29 NOTE — Telephone Encounter (Signed)
Called to confirm appts scheduled. - left message and sent reminder letter in the mail.

## 2016-11-29 NOTE — Progress Notes (Signed)
Oncology Nurse Navigator Documentation  Oncology Nurse Navigator Flowsheets 11/29/2016  Navigator Location [facility]  Navigator Encounter Type Letter/Fax/Email/I received a call from [employer].  They are requesting I fax chemo schedule.  I faxed.  Barriers/Navigation Needs Coordination of Care  Interventions Coordination of Care  Coordination of Care Other  Acuity Level 2  Time Spent with Patient 30

## 2016-11-30 ENCOUNTER — Other Ambulatory Visit: Payer: Self-pay | Admitting: Cardiology

## 2016-12-03 ENCOUNTER — Ambulatory Visit (HOSPITAL_BASED_OUTPATIENT_CLINIC_OR_DEPARTMENT_OTHER): Payer: Self-pay

## 2016-12-03 ENCOUNTER — Other Ambulatory Visit (HOSPITAL_BASED_OUTPATIENT_CLINIC_OR_DEPARTMENT_OTHER): Payer: Self-pay

## 2016-12-03 VITALS — BP 106/63 | HR 97 | Temp 98.3°F | Resp 18

## 2016-12-03 DIAGNOSIS — C3411 Malignant neoplasm of upper lobe, right bronchus or lung: Secondary | ICD-10-CM

## 2016-12-03 DIAGNOSIS — K8689 Other specified diseases of pancreas: Secondary | ICD-10-CM

## 2016-12-03 DIAGNOSIS — C787 Secondary malignant neoplasm of liver and intrahepatic bile duct: Secondary | ICD-10-CM

## 2016-12-03 DIAGNOSIS — Z5111 Encounter for antineoplastic chemotherapy: Secondary | ICD-10-CM

## 2016-12-03 DIAGNOSIS — C771 Secondary and unspecified malignant neoplasm of intrathoracic lymph nodes: Secondary | ICD-10-CM

## 2016-12-03 DIAGNOSIS — C3490 Malignant neoplasm of unspecified part of unspecified bronchus or lung: Secondary | ICD-10-CM

## 2016-12-03 LAB — COMPREHENSIVE METABOLIC PANEL
ALT: 35 U/L (ref 0–55)
ANION GAP: 8 meq/L (ref 3–11)
AST: 37 U/L — ABNORMAL HIGH (ref 5–34)
Albumin: 3.1 g/dL — ABNORMAL LOW (ref 3.5–5.0)
Alkaline Phosphatase: 118 U/L (ref 40–150)
BUN: 14 mg/dL (ref 7.0–26.0)
CHLORIDE: 98 meq/L (ref 98–109)
CO2: 28 meq/L (ref 22–29)
Calcium: 9.3 mg/dL (ref 8.4–10.4)
Creatinine: 1.1 mg/dL (ref 0.7–1.3)
EGFR: 78 mL/min/{1.73_m2} — ABNORMAL LOW (ref >90)
Glucose: 110 mg/dl (ref 70–140)
Potassium: 4.2 mEq/L (ref 3.5–5.1)
Sodium: 134 mEq/L — ABNORMAL LOW (ref 136–145)
Total Bilirubin: 0.5 mg/dL (ref 0.20–1.20)
Total Protein: 7.1 g/dL (ref 6.4–8.3)

## 2016-12-03 LAB — CBC WITH DIFFERENTIAL/PLATELET
BASO%: 0.7 % (ref 0.0–2.0)
Basophils Absolute: 0 10*3/uL (ref 0.0–0.1)
EOS%: 5.4 % (ref 0.0–7.0)
Eosinophils Absolute: 0.2 10*3/uL (ref 0.0–0.5)
HCT: 32.6 % — ABNORMAL LOW (ref 38.4–49.9)
HGB: 10.8 g/dL — ABNORMAL LOW (ref 13.0–17.1)
LYMPH%: 16.6 % (ref 14.0–49.0)
MCH: 28.4 pg (ref 27.2–33.4)
MCHC: 33.2 g/dL (ref 32.0–36.0)
MCV: 85.6 fL (ref 79.3–98.0)
MONO#: 0.5 10*3/uL (ref 0.1–0.9)
MONO%: 11.4 % (ref 0.0–14.0)
NEUT#: 3.1 10*3/uL (ref 1.5–6.5)
NEUT%: 65.9 % (ref 39.0–75.0)
PLATELETS: 213 10*3/uL (ref 140–400)
RBC: 3.81 10*6/uL — AB (ref 4.20–5.82)
RDW: 16.7 % — ABNORMAL HIGH (ref 11.0–14.6)
WBC: 4.7 10*3/uL (ref 4.0–10.3)
lymph#: 0.8 10*3/uL — ABNORMAL LOW (ref 0.9–3.3)

## 2016-12-03 MED ORDER — PALONOSETRON HCL INJECTION 0.25 MG/5ML
INTRAVENOUS | Status: AC
  Filled 2016-12-03: qty 5

## 2016-12-03 MED ORDER — ETOPOSIDE CHEMO INJECTION 1 GM/50ML
80.0000 mg/m2 | Freq: Once | INTRAVENOUS | Status: AC
  Administered 2016-12-03: 170 mg via INTRAVENOUS
  Filled 2016-12-03: qty 8.5

## 2016-12-03 MED ORDER — CARBOPLATIN CHEMO INTRADERMAL TEST DOSE 100MCG/0.02ML
100.0000 ug | Freq: Once | INTRADERMAL | Status: AC
  Administered 2016-12-03: 100 ug via INTRADERMAL
  Filled 2016-12-03: qty 0.02

## 2016-12-03 MED ORDER — DEXAMETHASONE SODIUM PHOSPHATE 10 MG/ML IJ SOLN
10.0000 mg | Freq: Once | INTRAMUSCULAR | Status: AC
  Administered 2016-12-03: 10 mg via INTRAVENOUS

## 2016-12-03 MED ORDER — PALONOSETRON HCL INJECTION 0.25 MG/5ML
0.2500 mg | Freq: Once | INTRAVENOUS | Status: AC
  Administered 2016-12-03: 0.25 mg via INTRAVENOUS

## 2016-12-03 MED ORDER — SODIUM CHLORIDE 0.9 % IV SOLN
607.5000 mg | Freq: Once | INTRAVENOUS | Status: AC
  Administered 2016-12-03: 610 mg via INTRAVENOUS
  Filled 2016-12-03: qty 61

## 2016-12-03 MED ORDER — DEXAMETHASONE SODIUM PHOSPHATE 100 MG/10ML IJ SOLN: 10.0000 mg | Freq: Once | INTRAMUSCULAR | Status: DC

## 2016-12-03 MED ORDER — SODIUM CHLORIDE 0.9 % IV SOLN
Freq: Once | INTRAVENOUS | Status: AC
  Administered 2016-12-03: 12:00:00 via INTRAVENOUS

## 2016-12-03 MED ORDER — DEXAMETHASONE SODIUM PHOSPHATE 10 MG/ML IJ SOLN
INTRAMUSCULAR | Status: AC
  Filled 2016-12-03: qty 1

## 2016-12-03 NOTE — Patient Instructions (Signed)
[facility] Discharge Instructions for Patients Receiving Chemotherapy  Today you received the following chemotherapy agents: Etoposide and Carboplatin   To help prevent nausea and vomiting after your treatment, we encourage you to take your nausea medication as directed.    If you develop nausea and vomiting that is not controlled by your nausea medication, call the clinic.   BELOW ARE SYMPTOMS THAT SHOULD BE REPORTED IMMEDIATELY:  *FEVER GREATER THAN 100.5 F  *CHILLS WITH OR WITHOUT FEVER  NAUSEA AND VOMITING THAT IS NOT CONTROLLED WITH YOUR NAUSEA MEDICATION  *UNUSUAL SHORTNESS OF BREATH  *UNUSUAL BRUISING OR BLEEDING  TENDERNESS IN MOUTH AND THROAT WITH OR WITHOUT PRESENCE OF ULCERS  *URINARY PROBLEMS  *BOWEL PROBLEMS  UNUSUAL RASH Items with * indicate a potential emergency and should be followed up as soon as possible.  Feel free to call the clinic you have any questions or concerns. The clinic phone number is [phone].  Please show the [facility] at check-in to the Emergency Department and triage nurse.  Etoposide, VP-16 capsules What is this medicine? ETOPOSIDE, VP-16 (e toe POE side) is a chemotherapy drug. It is used to treat small cell lung cancer and other cancers. This medicine may be used for other purposes; ask your health care provider or pharmacist if you have questions. COMMON BRAND NAME(S): VePesid What should I tell my health care provider before I take this medicine? They need to know if you have any of these conditions: -infection -kidney disease -liver disease -low blood counts, like low white cell, platelet, or red cell counts -an unusual or allergic reaction to etoposide, other medicines, foods, dyes, or preservatives -pregnant or trying to get pregnant -breast-feeding How should I use this medicine? Take this medicine by mouth with a glass of water. Follow the directions on the prescription label. Do not open, crush,  or chew the capsules. It is advisable to wear gloves when handling this medicine. Take your medicine at regular intervals. Do not take it more often than directed. Do not stop taking except on your doctor's advice. Talk to your pediatrician regarding the use of this medicine in children. Special care may be needed. Overdosage: If you think you have taken too much of this medicine contact a poison control center or emergency room at once. NOTE: This medicine is only for you. Do not share this medicine with others. What if I miss a dose? If you miss a dose, take it as soon as you can. If it is almost time for your next dose, take only that dose. Do not take double or extra doses. What may interact with this medicine? -aspirin -certain medications for seizures like carbamazepine, phenobarbital, phenytoin, valproic acid -cyclosporine -levamisole -valproic acid -warfarin This list may not describe all possible interactions. Give your health care provider a list of all the medicines, herbs, non-prescription drugs, or dietary supplements you use. Also tell them if you smoke, drink alcohol, or use illegal drugs. Some items may interact with your medicine. What should I watch for while using this medicine? Visit your doctor for checks on your progress. This drug may make you feel generally unwell. This is not uncommon, as chemotherapy can affect healthy cells as well as cancer cells. Report any side effects. Continue your course of treatment even though you feel ill unless your doctor tells you to stop. In some cases, you may be given additional medicines to help with side effects. Follow all directions for their use. Call your  doctor or health care professional for advice if you get a fever, chills or sore throat, or other symptoms of a cold or flu. Do not treat yourself. This drug decreases your body's ability to fight infections. Try to avoid being around people who are sick. This medicine may increase  your risk to bruise or bleed. Call your doctor or health care professional if you notice any unusual bleeding. Talk to your doctor about your risk of cancer. You may be more at risk for certain types of cancers if you take this medicine. Do not become pregnant while taking this medicine or for at least 6 months after stopping it. Women should inform their doctor if they wish to become pregnant or think they might be pregnant. Women of child-bearing potential will need to have a negative pregnancy test before starting this medicine. There is a potential for serious side effects to an unborn child. Talk to your health care professional or pharmacist for more information. Do not breast-feed an infant while taking this medicine. Men must use a latex condom during sexual contact with a woman while taking this medicine and for at least 4 months after stopping it. A latex condom is needed even if you have had a vasectomy. Contact your doctor right away if your partner becomes pregnant. Do not donate sperm while taking this medicine and for 4 months after you stop taking this medicine. Men should inform their doctors if they wish to father a child. This medicine may lower sperm counts. What side effects may I notice from receiving this medicine? Side effects that you should report to your doctor or health care professional as soon as possible: -allergic reactions like skin rash, itching or hives, swelling of the face, lips, or tongue -low blood counts - this medicine may decrease the number of white blood cells, red blood cells and platelets. You may be at increased risk for infections and bleeding. -signs of infection - fever or chills, cough, sore throat, pain or difficulty passing urine -signs of decreased platelets or bleeding - bruising, pinpoint red spots on the skin, black, tarry stools, blood in the urine -signs of decreased red blood cells - unusually weak or tired, fainting spells,  lightheadedness -breathing problems -changes in vision -mouth or throat sores or ulcers -pain, tingling, numbness in the hands or feet -redness, blistering, peeling or loosening of the skin, including inside the mouth -seizures -vomiting Side effects that usually do not require medical attention (report to your doctor or health care professional if they continue or are bothersome): -change in taste -diarrhea -hair loss -nausea -stomach pain This list may not describe all possible side effects. Call your doctor for medical advice about side effects. You may report side effects to FDA at 1-800-FDA-1088. Where should I keep my medicine? Keep out of the reach of children. Store in a refrigerator between 2 and 8 degrees C (36 and 46 degrees F). Do not freeze. Throw away any unused medicine after the expiration date. NOTE: This sheet is a summary. It may not cover all possible information. If you have questions about this medicine, talk to your doctor, pharmacist, or health care provider.  2018 Elsevier/Gold Standard (2015-04-01 11:49:52)   Carboplatin injection What is this medicine? CARBOPLATIN (KAR boe pla tin) is a chemotherapy drug. It targets fast dividing cells, like cancer cells, and causes these cells to die. This medicine is used to treat ovarian cancer and many other cancers. This medicine may be used for other purposes;  ask your health care provider or pharmacist if you have questions. COMMON BRAND NAME(S): Paraplatin What should I tell my health care provider before I take this medicine? They need to know if you have any of these conditions: -blood disorders -hearing problems -kidney disease -recent or ongoing radiation therapy -an unusual or allergic reaction to carboplatin, cisplatin, other chemotherapy, other medicines, foods, dyes, or preservatives -pregnant or trying to get pregnant -breast-feeding How should I use this medicine? This drug is usually given as an  infusion into a vein. It is administered in a hospital or clinic by a specially trained health care professional. Talk to your pediatrician regarding the use of this medicine in children. Special care may be needed. Overdosage: If you think you have taken too much of this medicine contact a poison control center or emergency room at once. NOTE: This medicine is only for you. Do not share this medicine with others. What if I miss a dose? It is important not to miss a dose. Call your doctor or health care professional if you are unable to keep an appointment. What may interact with this medicine? -medicines for seizures -medicines to increase blood counts like filgrastim, pegfilgrastim, sargramostim -some antibiotics like amikacin, gentamicin, neomycin, streptomycin, tobramycin -vaccines Talk to your doctor or health care professional before taking any of these medicines: -acetaminophen -aspirin -ibuprofen -ketoprofen -naproxen This list may not describe all possible interactions. Give your health care provider a list of all the medicines, herbs, non-prescription drugs, or dietary supplements you use. Also tell them if you smoke, drink alcohol, or use illegal drugs. Some items may interact with your medicine. What should I watch for while using this medicine? Your condition will be monitored carefully while you are receiving this medicine. You will need important blood work done while you are taking this medicine. This drug may make you feel generally unwell. This is not uncommon, as chemotherapy can affect healthy cells as well as cancer cells. Report any side effects. Continue your course of treatment even though you feel ill unless your doctor tells you to stop. In some cases, you may be given additional medicines to help with side effects. Follow all directions for their use. Call your doctor or health care professional for advice if you get a fever, chills or sore throat, or other symptoms  of a cold or flu. Do not treat yourself. This drug decreases your body's ability to fight infections. Try to avoid being around people who are sick. This medicine may increase your risk to bruise or bleed. Call your doctor or health care professional if you notice any unusual bleeding. Be careful brushing and flossing your teeth or using a toothpick because you may get an infection or bleed more easily. If you have any dental work done, tell your dentist you are receiving this medicine. Avoid taking products that contain aspirin, acetaminophen, ibuprofen, naproxen, or ketoprofen unless instructed by your doctor. These medicines may hide a fever. Do not become pregnant while taking this medicine. Women should inform their doctor if they wish to become pregnant or think they might be pregnant. There is a potential for serious side effects to an unborn child. Talk to your health care professional or pharmacist for more information. Do not breast-feed an infant while taking this medicine. What side effects may I notice from receiving this medicine? Side effects that you should report to your doctor or health care professional as soon as possible: -allergic reactions like skin rash, itching or hives,  swelling of the face, lips, or tongue -signs of infection - fever or chills, cough, sore throat, pain or difficulty passing urine -signs of decreased platelets or bleeding - bruising, pinpoint red spots on the skin, black, tarry stools, nosebleeds -signs of decreased red blood cells - unusually weak or tired, fainting spells, lightheadedness -breathing problems -changes in hearing -changes in vision -chest pain -high blood pressure -low blood counts - This drug may decrease the number of white blood cells, red blood cells and platelets. You may be at increased risk for infections and bleeding. -nausea and vomiting -pain, swelling, redness or irritation at the injection site -pain, tingling, numbness in the  hands or feet -problems with balance, talking, walking -trouble passing urine or change in the amount of urine Side effects that usually do not require medical attention (report to your doctor or health care professional if they continue or are bothersome): -hair loss -loss of appetite -metallic taste in the mouth or changes in taste This list may not describe all possible side effects. Call your doctor for medical advice about side effects. You may report side effects to FDA at 1-800-FDA-1088. Where should I keep my medicine? This drug is given in a hospital or clinic and will not be stored at home. NOTE: This sheet is a summary. It may not cover all possible information. If you have questions about this medicine, talk to your doctor, pharmacist, or health care provider.  2018 Elsevier/Gold Standard (2007-07-15 14:38:05)

## 2016-12-04 ENCOUNTER — Ambulatory Visit
Admission: RE | Admit: 2016-12-04 | Discharge: 2016-12-04 | Disposition: A | Discharge status: Home / Self Care | Payer: Self-pay | Source: Ambulatory Visit | Attending: Radiation Oncology | Admitting: Radiation Oncology

## 2016-12-04 ENCOUNTER — Ambulatory Visit: Payer: Self-pay

## 2016-12-04 ENCOUNTER — Ambulatory Visit (HOSPITAL_BASED_OUTPATIENT_CLINIC_OR_DEPARTMENT_OTHER): Payer: Self-pay

## 2016-12-04 VITALS — BP 125/78 | HR 98 | Temp 97.9°F | Resp 18

## 2016-12-04 DIAGNOSIS — Z5111 Encounter for antineoplastic chemotherapy: Secondary | ICD-10-CM

## 2016-12-04 DIAGNOSIS — C771 Secondary and unspecified malignant neoplasm of intrathoracic lymph nodes: Secondary | ICD-10-CM

## 2016-12-04 DIAGNOSIS — C3490 Malignant neoplasm of unspecified part of unspecified bronchus or lung: Secondary | ICD-10-CM

## 2016-12-04 DIAGNOSIS — C3411 Malignant neoplasm of upper lobe, right bronchus or lung: Secondary | ICD-10-CM

## 2016-12-04 DIAGNOSIS — C787 Secondary malignant neoplasm of liver and intrahepatic bile duct: Secondary | ICD-10-CM

## 2016-12-04 DIAGNOSIS — K8689 Other specified diseases of pancreas: Secondary | ICD-10-CM

## 2016-12-04 MED ORDER — DEXAMETHASONE SODIUM PHOSPHATE 10 MG/ML IJ SOLN
10.0000 mg | Freq: Once | INTRAMUSCULAR | Status: AC
  Administered 2016-12-04: 10 mg via INTRAVENOUS

## 2016-12-04 MED ORDER — SODIUM CHLORIDE 0.9 % IV SOLN: 10.0000 mg | Freq: Once | INTRAVENOUS | Status: DC

## 2016-12-04 MED ORDER — SODIUM CHLORIDE 0.9 % IV SOLN
Freq: Once | INTRAVENOUS | Status: AC
  Administered 2016-12-04: 15:00:00 via INTRAVENOUS

## 2016-12-04 MED ORDER — DEXAMETHASONE SODIUM PHOSPHATE 10 MG/ML IJ SOLN
INTRAMUSCULAR | Status: AC
Start: 2016-12-04 — End: 2016-12-04
  Filled 2016-12-04: qty 1

## 2016-12-04 MED ORDER — SODIUM CHLORIDE 0.9 % IV SOLN
80.0000 mg/m2 | Freq: Once | INTRAVENOUS | Status: AC
  Administered 2016-12-04: 170 mg via INTRAVENOUS
  Filled 2016-12-04: qty 8.5

## 2016-12-04 NOTE — Patient Instructions (Signed)
[facility] Discharge Instructions for Patients Receiving Chemotherapy  Today you received the following chemotherapy agents Etoposide  To help prevent nausea and vomiting after your treatment, we encourage you to take your nausea medication As directed If you develop nausea and vomiting that is not controlled by your nausea medication, call the clinic.   BELOW ARE SYMPTOMS THAT SHOULD BE REPORTED IMMEDIATELY:  *FEVER GREATER THAN 100.5 F  *CHILLS WITH OR WITHOUT FEVER  NAUSEA AND VOMITING THAT IS NOT CONTROLLED WITH YOUR NAUSEA MEDICATION  *UNUSUAL SHORTNESS OF BREATH  *UNUSUAL BRUISING OR BLEEDING  TENDERNESS IN MOUTH AND THROAT WITH OR WITHOUT PRESENCE OF ULCERS  *URINARY PROBLEMS  *BOWEL PROBLEMS  UNUSUAL RASH Items with * indicate a potential emergency and should be followed up as soon as possible.  Feel free to call the clinic you have any questions or concerns. The clinic phone number is [phone].  Please show the [facility] at check-in to the Emergency Department and triage nurse.

## 2016-12-05 ENCOUNTER — Ambulatory Visit (HOSPITAL_BASED_OUTPATIENT_CLINIC_OR_DEPARTMENT_OTHER): Payer: Self-pay

## 2016-12-05 VITALS — BP 130/79 | HR 91 | Temp 97.6°F | Resp 18

## 2016-12-05 DIAGNOSIS — C3411 Malignant neoplasm of upper lobe, right bronchus or lung: Secondary | ICD-10-CM

## 2016-12-05 DIAGNOSIS — C787 Secondary malignant neoplasm of liver and intrahepatic bile duct: Secondary | ICD-10-CM

## 2016-12-05 DIAGNOSIS — C771 Secondary and unspecified malignant neoplasm of intrathoracic lymph nodes: Secondary | ICD-10-CM

## 2016-12-05 DIAGNOSIS — K8689 Other specified diseases of pancreas: Secondary | ICD-10-CM

## 2016-12-05 DIAGNOSIS — C3490 Malignant neoplasm of unspecified part of unspecified bronchus or lung: Secondary | ICD-10-CM

## 2016-12-05 DIAGNOSIS — Z5111 Encounter for antineoplastic chemotherapy: Secondary | ICD-10-CM

## 2016-12-05 MED ORDER — ETOPOSIDE CHEMO INJECTION 1 GM/50ML
80.0000 mg/m2 | Freq: Once | INTRAVENOUS | Status: AC
  Administered 2016-12-05: 170 mg via INTRAVENOUS
  Filled 2016-12-05: qty 8.5

## 2016-12-05 MED ORDER — DEXAMETHASONE SODIUM PHOSPHATE 10 MG/ML IJ SOLN
INTRAMUSCULAR | Status: AC
  Filled 2016-12-05: qty 1

## 2016-12-05 MED ORDER — SODIUM CHLORIDE 0.9 % IV SOLN
Freq: Once | INTRAVENOUS | Status: AC
  Administered 2016-12-05: 09:00:00 via INTRAVENOUS

## 2016-12-05 MED ORDER — DEXAMETHASONE SODIUM PHOSPHATE 10 MG/ML IJ SOLN
10.0000 mg | Freq: Once | INTRAMUSCULAR | Status: AC
  Administered 2016-12-05: 10 mg via INTRAVENOUS

## 2016-12-05 MED ORDER — DEXAMETHASONE SODIUM PHOSPHATE 100 MG/10ML IJ SOLN: 10.0000 mg | Freq: Once | INTRAMUSCULAR | Status: DC

## 2016-12-05 NOTE — Patient Instructions (Signed)
[facility] Discharge Instructions for Patients Receiving Chemotherapy  Today you received the following chemotherapy agents Etoposide  To help prevent nausea and vomiting after your treatment, we encourage you to take your nausea medication As directed If you develop nausea and vomiting that is not controlled by your nausea medication, call the clinic.   BELOW ARE SYMPTOMS THAT SHOULD BE REPORTED IMMEDIATELY:  *FEVER GREATER THAN 100.5 F  *CHILLS WITH OR WITHOUT FEVER  NAUSEA AND VOMITING THAT IS NOT CONTROLLED WITH YOUR NAUSEA MEDICATION  *UNUSUAL SHORTNESS OF BREATH  *UNUSUAL BRUISING OR BLEEDING  TENDERNESS IN MOUTH AND THROAT WITH OR WITHOUT PRESENCE OF ULCERS  *URINARY PROBLEMS  *BOWEL PROBLEMS  UNUSUAL RASH Items with * indicate a potential emergency and should be followed up as soon as possible.  Feel free to call the clinic you have any questions or concerns. The clinic phone number is [phone].  Please show the [facility] at check-in to the Emergency Department and triage nurse.

## 2016-12-06 ENCOUNTER — Other Ambulatory Visit: Payer: Self-pay | Admitting: *Deleted

## 2016-12-06 DIAGNOSIS — C3490 Malignant neoplasm of unspecified part of unspecified bronchus or lung: Secondary | ICD-10-CM

## 2016-12-06 DIAGNOSIS — C787 Secondary malignant neoplasm of liver and intrahepatic bile duct: Secondary | ICD-10-CM

## 2016-12-06 MED ORDER — OXYCODONE-ACETAMINOPHEN 5-325 MG PO TABS: 1.0000 | ORAL_TABLET | ORAL | 0 refills | Status: DC | PRN

## 2016-12-07 ENCOUNTER — Ambulatory Visit (HOSPITAL_BASED_OUTPATIENT_CLINIC_OR_DEPARTMENT_OTHER): Payer: Self-pay

## 2016-12-07 VITALS — BP 110/62 | HR 99 | Temp 98.0°F | Resp 20

## 2016-12-07 DIAGNOSIS — C3411 Malignant neoplasm of upper lobe, right bronchus or lung: Secondary | ICD-10-CM

## 2016-12-07 DIAGNOSIS — C787 Secondary malignant neoplasm of liver and intrahepatic bile duct: Secondary | ICD-10-CM

## 2016-12-07 DIAGNOSIS — Z5189 Encounter for other specified aftercare: Secondary | ICD-10-CM

## 2016-12-07 DIAGNOSIS — C3490 Malignant neoplasm of unspecified part of unspecified bronchus or lung: Secondary | ICD-10-CM

## 2016-12-07 DIAGNOSIS — C771 Secondary and unspecified malignant neoplasm of intrathoracic lymph nodes: Secondary | ICD-10-CM

## 2016-12-07 DIAGNOSIS — K8689 Other specified diseases of pancreas: Secondary | ICD-10-CM

## 2016-12-07 MED ORDER — PEGFILGRASTIM INJECTION 6 MG/0.6ML ~~LOC~~
6.0000 mg | PREFILLED_SYRINGE | Freq: Once | SUBCUTANEOUS | Status: AC
  Administered 2016-12-07: 6 mg via SUBCUTANEOUS
  Filled 2016-12-07: qty 0.6

## 2016-12-07 NOTE — Patient Instructions (Signed)
Pegfilgrastim injection What is this medicine? PEGFILGRASTIM (PEG fil gra stim) is a long-acting granulocyte colony-stimulating factor that stimulates the growth of neutrophils, a type of white blood cell important in the body's fight against infection. It is used to reduce the incidence of fever and infection in patients with certain types of cancer who are receiving chemotherapy that affects the bone marrow, and to increase survival after being exposed to high doses of radiation. This medicine may be used for other purposes; ask your health care provider or pharmacist if you have questions. COMMON BRAND NAME(S): Neulasta What should I tell my health care provider before I take this medicine? They need to know if you have any of these conditions: -kidney disease -latex allergy -ongoing radiation therapy -sickle cell disease -skin reactions to acrylic adhesives (On-Body Injector only) -an unusual or allergic reaction to pegfilgrastim, filgrastim, other medicines, foods, dyes, or preservatives -pregnant or trying to get pregnant -breast-feeding How should I use this medicine? This medicine is for injection under the skin. If you get this medicine at home, you will be taught how to prepare and give the pre-filled syringe or how to use the On-body Injector. Refer to the patient Instructions for Use for detailed instructions. Use exactly as directed. Tell your healthcare provider immediately if you suspect that the On-body Injector may not have performed as intended or if you suspect the use of the On-body Injector resulted in a missed or partial dose. It is important that you put your used needles and syringes in a special sharps container. Do not put them in a trash can. If you do not have a sharps container, call your pharmacist or healthcare provider to get one. Talk to your pediatrician regarding the use of this medicine in children. While this drug may be prescribed for selected conditions,  precautions do apply. Overdosage: If you think you have taken too much of this medicine contact a poison control center or emergency room at once. NOTE: This medicine is only for you. Do not share this medicine with others. What if I miss a dose? It is important not to miss your dose. Call your doctor or health care professional if you miss your dose. If you miss a dose due to an On-body Injector failure or leakage, a new dose should be administered as soon as possible using a single prefilled syringe for manual use. What may interact with this medicine? Interactions have not been studied. Give your health care provider a list of all the medicines, herbs, non-prescription drugs, or dietary supplements you use. Also tell them if you smoke, drink alcohol, or use illegal drugs. Some items may interact with your medicine. This list may not describe all possible interactions. Give your health care provider a list of all the medicines, herbs, non-prescription drugs, or dietary supplements you use. Also tell them if you smoke, drink alcohol, or use illegal drugs. Some items may interact with your medicine. What should I watch for while using this medicine? You may need blood work done while you are taking this medicine. If you are going to need a MRI, CT scan, or other procedure, tell your doctor that you are using this medicine (On-Body Injector only). What side effects may I notice from receiving this medicine? Side effects that you should report to your doctor or health care professional as soon as possible: -allergic reactions like skin rash, itching or hives, swelling of the face, lips, or tongue -dizziness -fever -pain, redness, or irritation at site   where injected -pinpoint red spots on the skin -red or dark-brown urine -shortness of breath or breathing problems -stomach or side pain, or pain at the shoulder -swelling -tiredness -trouble passing urine or change in the amount of urine Side  effects that usually do not require medical attention (report to your doctor or health care professional if they continue or are bothersome): -bone pain -muscle pain This list may not describe all possible side effects. Call your doctor for medical advice about side effects. You may report side effects to FDA at 1-800-FDA-1088. Where should I keep my medicine? Keep out of the reach of children. Store pre-filled syringes in a refrigerator between 2 and 8 degrees C (36 and 46 degrees F). Do not freeze. Keep in carton to protect from light. Throw away this medicine if it is left out of the refrigerator for more than 48 hours. Throw away any unused medicine after the expiration date. NOTE: This sheet is a summary. It may not cover all possible information. If you have questions about this medicine, talk to your doctor, pharmacist, or health care provider.  2018 Elsevier/Gold Standard (2016-04-05 12:58:03)  

## 2016-12-10 ENCOUNTER — Telehealth: Payer: Self-pay | Admitting: *Deleted

## 2016-12-10 ENCOUNTER — Ambulatory Visit: Payer: Self-pay | Admitting: Oncology

## 2016-12-10 ENCOUNTER — Other Ambulatory Visit: Payer: Self-pay

## 2016-12-10 NOTE — Telephone Encounter (Signed)
Oncology Nurse Navigator Documentation  Oncology Nurse Navigator Flowsheets 12/10/2016  Navigator Location [facility]  Navigator Encounter Type Telephone/patient was a no show today.  I called [facility] and patient showed up for that appt. I called patient to re-schedule but was unable to reach.  I left vm message with name and phone number to call.   Telephone Outgoing Call  Barriers/Navigation Needs Coordination of Care  Interventions Coordination of Care  Coordination of Care Other  Acuity Level 2  Time Spent with Patient 45

## 2016-12-11 ENCOUNTER — Encounter: Payer: Self-pay | Admitting: *Deleted

## 2016-12-11 NOTE — Progress Notes (Signed)
Oncology Nurse Navigator Documentation  Oncology Nurse Navigator Flowsheets 12/11/2016  Navigator Location [facility]  Navigator Encounter Type Telephone/I received a call from [place].  She had [name] at facility and she let me speak with him.  I updated him on low Na+ level and per Dr. Worthy Flank instructions to increase salt in his diet. He verbalized understanding of instructions. I also updated him on appt on 8/23 with NP and the need to keep appt.  He again verbalized understanding.   Telephone Incoming Call  Treatment Phase Treatment  Barriers/Navigation Needs Coordination of Care;Education  Education Other  Interventions Education;Coordination of Care  Coordination of Care Appts  Education Method Verbal  Acuity Level 2  Time Spent with Patient 15

## 2016-12-13 ENCOUNTER — Encounter: Payer: Self-pay | Admitting: Oncology

## 2016-12-13 ENCOUNTER — Ambulatory Visit (HOSPITAL_BASED_OUTPATIENT_CLINIC_OR_DEPARTMENT_OTHER): Payer: Self-pay | Admitting: Oncology

## 2016-12-13 ENCOUNTER — Other Ambulatory Visit (HOSPITAL_BASED_OUTPATIENT_CLINIC_OR_DEPARTMENT_OTHER): Payer: Self-pay

## 2016-12-13 VITALS — BP 122/66 | HR 104 | Temp 97.6°F | Resp 18 | Ht 69.0 in | Wt 189.6 lb

## 2016-12-13 DIAGNOSIS — C787 Secondary malignant neoplasm of liver and intrahepatic bile duct: Secondary | ICD-10-CM

## 2016-12-13 DIAGNOSIS — C3411 Malignant neoplasm of upper lobe, right bronchus or lung: Secondary | ICD-10-CM

## 2016-12-13 DIAGNOSIS — C771 Secondary and unspecified malignant neoplasm of intrathoracic lymph nodes: Secondary | ICD-10-CM

## 2016-12-13 DIAGNOSIS — C3490 Malignant neoplasm of unspecified part of unspecified bronchus or lung: Secondary | ICD-10-CM

## 2016-12-13 LAB — COMPREHENSIVE METABOLIC PANEL
ALBUMIN: 3.2 g/dL — AB (ref 3.5–5.0)
ALT: 19 U/L (ref 0–55)
ANION GAP: 8 meq/L (ref 3–11)
AST: 26 U/L (ref 5–34)
Alkaline Phosphatase: 129 U/L (ref 40–150)
BILIRUBIN TOTAL: 0.53 mg/dL (ref 0.20–1.20)
BUN: 13 mg/dL (ref 7.0–26.0)
CO2: 28 meq/L (ref 22–29)
CREATININE: 1 mg/dL (ref 0.7–1.3)
Calcium: 9.4 mg/dL (ref 8.4–10.4)
Chloride: 95 mEq/L — ABNORMAL LOW (ref 98–109)
EGFR: 87 mL/min/{1.73_m2} — ABNORMAL LOW (ref >90)
GLUCOSE: 98 mg/dL (ref 70–140)
Potassium: 4 mEq/L (ref 3.5–5.1)
Sodium: 132 mEq/L — ABNORMAL LOW (ref 136–145)
Total Protein: 7.1 g/dL (ref 6.4–8.3)

## 2016-12-13 LAB — CBC WITH DIFFERENTIAL/PLATELET
BASO%: 0.3 % (ref 0.0–2.0)
BASOS ABS: 0 10*3/uL (ref 0.0–0.1)
EOS ABS: 0.1 10*3/uL (ref 0.0–0.5)
EOS%: 1.4 % (ref 0.0–7.0)
HEMATOCRIT: 28.9 % — AB (ref 38.4–49.9)
HEMOGLOBIN: 9.4 g/dL — AB (ref 13.0–17.1)
LYMPH#: 0.7 10*3/uL — AB (ref 0.9–3.3)
LYMPH%: 9.9 % — ABNORMAL LOW (ref 14.0–49.0)
MCH: 27.8 pg (ref 27.2–33.4)
MCHC: 32.5 g/dL (ref 32.0–36.0)
MCV: 85.5 fL (ref 79.3–98.0)
MONO#: 1 10*3/uL — AB (ref 0.1–0.9)
MONO%: 13.2 % (ref 0.0–14.0)
NEUT#: 5.4 10*3/uL (ref 1.5–6.5)
NEUT%: 75.2 % — AB (ref 39.0–75.0)
PLATELETS: 101 10*3/uL — AB (ref 140–400)
RBC: 3.38 10*6/uL — ABNORMAL LOW (ref 4.20–5.82)
RDW: 15.7 % — AB (ref 11.0–14.6)
WBC: 7.2 10*3/uL (ref 4.0–10.3)

## 2016-12-13 NOTE — Progress Notes (Signed)
[facility] Follow up:    [name] Frizzle, MD [address] [place] [address]   DIAGNOSIS: Cancer Staging Small cell lung cancer [facility]) Staging form: Lung, AJCC 7th Edition - Clinical stage from 02/21/2015: Stage IV (T4, N2, M1b) - Signed by [facility], PA-C on 02/21/2015 Extensive stage small cell lung cancer diagnosed in September 2016 status post systemic chemotherapy with carboplatin and etoposide completed in December 2016.   SUMMARY OF ONCOLOGIC HISTORY:   Small cell lung cancer ([place])   01/18/2015 Imaging    Chest Xray- 6.8 cm x 7.6 cm RIGHT hilar mass most compatible with bronchogenic carcinoma.       01/18/2015 Imaging    CT chest- 8.7 x 8.7 cm R hilar mass encasing the R pulm artery and occluding the R upper lobe bronchus. Sm R pleural effusion; malignant or reactive. 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 34 mm x 26 mm mass in body of pancreas      01/21/2015 Imaging    MRI brain- No acute intracranial findings. No signs of metastatic disease. Minor white matter disease, stable and nonspecific.      01/26/2015 Pathology Results    Lung, biopsy, right upper lobe - SMALL CELL CARCINOMA.      01/26/2015 Pathology Results    Diagnosis FINE NEEDLE ASPIRATION: ENDOSCOPIC SPECIMEN A, EBUS LEVEL 7 NODE (SPECIMEN 1 OF 3 COLLECTED 01/26/2015) MALIGNANT CELLS PRESENT, CONSISTENT WITH SMALL CELL CARCINOMA.      01/26/2015 Pathology Results    WANG NEEDLE ASPIRATION, SPECIMEN B LEVEL 7 NODE (SPECIMEN 2 OF 3 COLLECTED 01/26/2015) MALIGNANT CELLS PRESENT, CONSISTENT WITH SMALL CELL CARCINOMA.      01/26/2015 Pathology Results    Diagnosis BRONCHIAL BRUSHING SPECIMEN C, RIGHT UPPER LOBE (SPECIMEN 3 OF 3 COLLECTED 01/26/2015) ATYPICAL CELLS PRESENT.      01/27/2015 PET scan    Large hypermetabolic right hilar mass with mediastinal invasion and infrahilar extension, maximum standard uptake value 13.0.  Pancreatic tail  mass, approximately 4.5 cm in diameter. Abdominal aortic aneurysm, 5.8 cm in diameter.      02/10/2015 Pathology Results    Diagnosis FINE NEEDLE ASPIRATION, ENDOSCOPIC, PANCREAS TAIL (SPECIMEN 1 OF 1 COLLECTED 02/10/15): MALIGNANT CELLS CONSISTENT WITH METASTATIC SMALL CELL CARCINOMA.      02/14/2015 - 03/06/2015 Chemotherapy    Cisplatin/Etoposide      03/06/2015 Treatment Plan Change    Change systemic therapy to Carboplatin based      03/07/2015 - 05/10/2015 Chemotherapy    Carboplatin/Etoposide.      04/13/2015 Imaging    resolution of pancreatic metastases, decrease in size of R perihilar mss and RUL mass. Interval response to therapy      05/10/2015 - 05/31/2015 Chemotherapy    Change to carboplatin/irinotecan for cycle #5 and #6 secondary to significant skin toxicity presumably from etoposide      07/12/2015 -  Radiation Therapy    25 Gy to whole brain and 30 Gy to R hilar region by Dr. Lisbeth Renshaw.      09/13/2015 Imaging    CT C/A/P unchanged from 2/28. persistent prominence of peribronchovascular intersitium throughout R lung, in perihilar region, no definitive findings to suggest progression of disease on exam      11/03/2015 Imaging    MRI brain 3 mm area of restricted diffusion R posterior temporal lobe without abnormal enhancement favor acute infarct, mild dural thickening enhancement over R hemisphere now present previously  11/15/2015 Imaging    CT C/A/P Interval development of 1.6 x 1.4 cm L adrenal nodule suspicious for metastatic lesion. No other sites of new metastatic disease are noted elsewhere in C/A/P. Evolving postradiation changes in R lung      12/01/2015 - 12/14/2015 Radiation Therapy    Radiation therapy to L adrenal nodule      01/18/2016 Imaging    CT C/A/P 1. Stable appearance of the chest. Specifically, there are post XRT findings in the right lung with perihilar and hilar airway thickening, some upper lobe airway plugging, and scattered densities  which merit observation but which are likely therapy rib related. 2. Centrilobular emphysema  3. Coronary, aortic arch, and branch vessel atherosclerotic vascular disease 4. On prior PET-CT from 01/27/2015 there was a pancreatic body mass. Prior biopsy from 02/10/2015 showed this to be consistent with metastatic small cell carcinoma. This mass is not visible on today's CT scan, and has presumably resolved with treatment. There is a transition from a somewhat atrophic pancreatic tail to a normal caliber pancreatic body occurring in the vicinity of the prior mass. 5. Reduced size of a small left adrenal mass, currently 1.2 by 1.7 cm 6. Infrarenal abdominal aortic aneurysm up to 5.8 cm diameter, with patent aorta bi-iliac stent graft in place and no evidence of endoleak 7. Lumbar spondylosis and degenerative disc disease with mild impingement at L3-4 and L4-5      02/02/2016 Imaging    No evidence of metastatic disease to the brain.Resolution of small right hemispheric fluid collection. No leptomeningeal enhancement identified on today's study. This finding may have been due to a small subdural hematoma previously which has resolved.Negative for acute infarct. Small area of restricted diffusion in the right posterior temporal lobe on the prior study likely was an area of acute infarct which has resolved.      04/13/2016 Imaging    CT CAP- 1. Relatively similar appearance of areas of right-sided radiation change. No well-defined residual or recurrent disease identified. 2. No thoracic adenopathy. 3. No evidence of metastatic disease in the abdomen or pelvis.      07/10/2016 Imaging    MRI brain- 1. No evidence of intracranial metastatic disease within limitations of mild motion artifact. 2. Unchanged subcentimeter lesion in the clivus. Small osseous metastasis not excluded.      07/20/2016 Imaging    CT CAP- 9.5 x 4.6 cm central right upper lobe mass extending to the perihilar region, new,  compatible with lung cancer recurrence. Associated subpleural nodularity in the right upper lobe, suspicious for metastatic disease.  Associated mild mediastinal lymphadenopathy, compatible with nodal metastases.  Multifocal hepatic lesions in both lobes, new, compatible with hepatic metastases, with index lesions as above.  Additional ancillary findings as above.      07/20/2016 Relapse/Recurrence    Last Treatment Date: 12/14/15 XRT; 06/10/15 Chemotherapy Recent Lab Values: Recent Lab Values:  N/A      08/23/2016 Imaging    c/o RLE pain. Doppler ultrasound done and referred to vascular surgeon: IMPRESSION: No evidence of DVT with the right lower extremity. 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 Arterial Doppler or CT angiogram is recommended for further evaluation.      09/28/2016 Progression    Restaging CT C/A/P:  Stable slight interval increase in size of large right upper lobe mass extending into the right hilum.  There is increased ground-glass nodularity peripherally predominately within the right upper lobe and involving the right  middle lobe which may represent superimposed infectious/inflammatory process. Disease progression in this location is not excluded. Recommend attention on followup.  Interval increase in size of multiple hepatic metastatic lesions.  Stable to mild interval decrease in size of mediastinal adenopathy.      PRIOR THERAPY:  He also received 2 doses of carboplatin and irinotecan discontinued February 2017. The patient had disease recurrence in March 2018. He was treated with 2 more cycles with carboplatin and irinotecan with no response followed by 1 cycle of treatment with Yervoy and Opdivo.  The patient was then referred to hospice care. He and his family were not in agreement with the decision and seeking more treatment options.  CURRENT THERAPY: Carboplatin for an AUC of 5 with etoposide 80  mg meter squared. Carboplatin is given on day 1 and etoposide given on days 1 through 3. Started on 12/03/2016. The patient is status post 1 cycle.  INTERVAL HISTORY: [name] 56 y.o. male returns for routine follow-up with his wife and daughter. Patient tolerated his first cycle chemotherapy well overall. He is also receiving radiation and thinks that his breathing has improved. He does report that he is coughing up blood intermittently for the past 3-4 days. Coughing last for about 30 minutes and then stops. He has not had any fevers or chills. Denies chest pain and shortness of breath at rest. He still has dyspnea on exertion. Denies abdominal pain, nausea, vomiting. No constipation or diarrhea. Appetite has been fair and he reports no weight loss.The patient is here for evaluation of symptoms.   Patient Active Problem List   Diagnosis Date Noted  . Encounter for antineoplastic chemotherapy 11/24/2016  . Goals of care, counseling/discussion 11/24/2016  . AAA (abdominal aortic aneurysm) without rupture ([place]) 08/11/2015  . Small cell lung cancer ([place]) 02/07/2015  . Mass of pancreas   . Lung mass-right 01/19/2015  . Pancreatic mass 01/19/2015  . COPD (chronic obstructive pulmonary disease) ([place]) 11/27/2010  . Tobacco abuse 11/22/2010  . Mixed hyperlipidemia 11/10/2010  . HYPERTENSION, BENIGN 01/10/2009  . CAD, NATIVE VESSEL 01/10/2009    is allergic to lisinopril and tea.  MEDICAL HISTORY: Past Medical History:  Diagnosis Date  . AAA (abdominal aortic aneurysm) ([place])   . Arthritis   . COPD (chronic obstructive pulmonary disease) ([place])   . Coronary artery disease 2002   Lesion in LAD s/p angioplasty; s/p CABG x 4 Sept 2013  . Encounter for antineoplastic chemotherapy 11/24/2016  . Essential hypertension, benign   . Hyperlipidemia   . Myocardial infarct ([place]) 1999, 2002  . Pancreatitis   . Small cell lung cancer ([place])    Stage IIIB with extension the pancreas    SURGICAL  HISTORY: Past Surgical History:  Procedure Laterality Date  . ABDOMINAL AORTIC ENDOVASCULAR STENT GRAFT N/A 08/11/2015   Procedure: ABDOMINAL AORTIC ENDOVASCULAR STENT GRAFT;  Surgeon: Serafina Mitchell, MD;  Location[facility];  Service: Vascular;  Laterality: N/A;  . [place], 2002  . CORONARY ARTERY BYPASS GRAFT  01/02/2012   Procedure: CORONARY ARTERY BYPASS GRAFTING (CABG);  Surgeon: [name], MD;  Location: [facility];  Service: Open Heart Surgery;  Laterality: N/A;  times three using left internal mammary artery and right endoscopically harvested saphenous vein  . EUS N/A 02/10/2015   Procedure: UPPER ENDOSCOPIC ULTRASOUND (EUS) LINEAR;  Surgeon: [name], MD;  Location: [facility] ENDOSCOPY;  Service: Endoscopy;  Laterality: N/A;  . KNEE SURGERY Left    ACL repair  .  LEFT HEART CATHETERIZATION WITH CORONARY ANGIOGRAM N/A 01/01/2012   Procedure: LEFT HEART CATHETERIZATION WITH CORONARY ANGIOGRAM;  Surgeon: Hillary Bow, MD;  Location: [facility] CATH LAB;  Service: Cardiovascular;  Laterality: N/A;  . LUNG BIOPSY N/A 01/26/2015   Procedure: LUNG BIOPSY;  Surgeon: [name], MD;  Location: [place];  Service: Thoracic;  Laterality: N/A;  . LYMPH NODE BIOPSY N/A 01/26/2015   Procedure: LYMPH NODE BIOPSY;  Surgeon: [name], MD;  Location: [facility];  Service: Thoracic;  Laterality: N/A;  . MULTIPLE EXTRACTIONS WITH ALVEOLOPLASTY  02/21/2012   Procedure: MULTIPLE EXTRACION WITH ALVEOLOPLASTY;  Surgeon: Lenn Cal, DDS;  Location: [facility] ORS;  Service: Oral Surgery;  Laterality: N/A;  Extaction of tooth #'s 4,5,6,7,10,11,12,13,14,15,19,20,21,22,23,24,25,   . PORTACATH PLACEMENT Left 02/01/2015   Procedure: ATTEMPTED INSERTION OF PORT-A-CATH;  Surgeon: [name], MD;  Location: [facility];  Service: Thoracic;  Laterality: Left;  . TEE WITHOUT CARDIOVERSION  01/02/2012   Procedure: TRANSESOPHAGEAL ECHOCARDIOGRAM (TEE);  Surgeon: [name], MD;  Location: [facility];   Service: Open Heart Surgery;  Laterality: N/A;  . VIDEO BRONCHOSCOPY WITH ENDOBRONCHIAL ULTRASOUND N/A 01/26/2015   Procedure: VIDEO BRONCHOSCOPY WITH ENDOBRONCHIAL ULTRASOUND;  Surgeon: [name], MD;  Location: [facility];  Service: Thoracic;  Laterality: N/A;    SOCIAL HISTORY: Social History   Social History  . Marital status: Married    Spouse name: N/A  . Number of children: 5  . Years of education: N/A   Occupational History  . Works in [employer]  . Smoking status: Current Every Day Smoker    Packs/day: 2.00    Years: 35.00    Types: Cigarettes    Start date: 06/30/1976  . Smokeless tobacco: Never Used  . Alcohol use No     Comment: Maybe a 12 pack/mth if that - very rare since 2013  . Drug use: No  . Sexual activity: Yes   Other Topics Concern  . Not on file   Social History Narrative  . No narrative on file    FAMILY HISTORY: Family History  Problem Relation Age of Onset  . Heart disease Father   . Diabetes Father   . Hyperlipidemia Father   . Hypertension Father   . Hyperlipidemia Mother   . Hypertension Mother   . Stroke Mother   . AAA (abdominal aortic aneurysm) Mother   . Diabetes Paternal Grandmother   . Cancer Neg Hx     Review of Systems  Constitutional: Positive for fatigue. Negative for appetite change, chills and fever.  HENT:  Negative.   Eyes: Negative.   Respiratory: Positive for cough and hemoptysis. Negative for chest tightness and wheezing.        Shortness of breath with exertion.  Cardiovascular: Negative.   Gastrointestinal: Negative.   Endocrine: Negative.   Genitourinary: Negative.    Musculoskeletal: Negative.   Skin: Negative.   Neurological: Negative.   Hematological: Negative.   Psychiatric/Behavioral: Negative.       PHYSICAL EXAMINATION  ECOG PERFORMANCE STATUS: 1 - Symptomatic but completely ambulatory  Vitals:   12/13/16 1137  BP: 122/66  Pulse: (!) 104  Resp: 18  Temp:  97.6 F (36.4 C)  SpO2: 100%    Physical Exam  Constitutional: He is oriented to person, place, and time and well-developed, well-nourished, and in no distress. No distress.  HENT:  Head: Normocephalic and atraumatic.  Mouth/Throat: Oropharynx is clear and moist. No  oropharyngeal exudate.  Eyes: Conjunctivae are normal. No scleral icterus.  Neck: Normal range of motion. Neck supple.  Cardiovascular: Normal rate, regular rhythm, normal heart sounds and intact distal pulses.   Pulmonary/Chest: Effort normal and breath sounds normal. No respiratory distress. He has no wheezes. He has no rales.  Abdominal: Soft. Bowel sounds are normal. He exhibits no distension and no mass.  Musculoskeletal: Normal range of motion. He exhibits no edema.  Lymphadenopathy:    He has no cervical adenopathy.  Neurological: He is alert and oriented to person, place, and time. He exhibits normal muscle tone.  Skin: Skin is warm and dry. No rash noted. He is not diaphoretic. No erythema. No pallor.  Psychiatric: Mood, memory, affect and judgment normal.  Vitals reviewed.   LABORATORY DATA:  CBC    Component Value Date/Time   WBC 7.2 12/13/2016 1126   WBC 5.2 11/01/2016 0817   RBC 3.38 (L) 12/13/2016 1126   RBC 4.04 (L) 11/01/2016 0817   HGB 9.4 (L) 12/13/2016 1126   HCT 28.9 (L) 12/13/2016 1126   PLT 101 (L) 12/13/2016 1126   MCV 85.5 12/13/2016 1126   MCH 27.8 12/13/2016 1126   MCH 30.2 11/01/2016 0817   MCHC 32.5 12/13/2016 1126   MCHC 33.6 11/01/2016 0817   RDW 15.7 (H) 12/13/2016 1126   LYMPHSABS 0.7 (L) 12/13/2016 1126   MONOABS 1.0 (H) 12/13/2016 1126   EOSABS 0.1 12/13/2016 1126   BASOSABS 0.0 12/13/2016 1126    CMP     Component Value Date/Time   NA 132 (L) 12/13/2016 1126   K 4.0 12/13/2016 1126   CL 97 (L) 11/01/2016 0817   CO2 28 12/13/2016 1126   GLUCOSE 98 12/13/2016 1126   BUN 13.0 12/13/2016 1126   CREATININE 1.0 12/13/2016 1126   CALCIUM 9.4 12/13/2016 1126   PROT 7.1  12/13/2016 1126   ALBUMIN 3.2 (L) 12/13/2016 1126   AST 26 12/13/2016 1126   ALT 19 12/13/2016 1126   ALKPHOS 129 12/13/2016 1126   BILITOT 0.53 12/13/2016 1126   GFRNONAA >60 11/01/2016 0817   GFRNONAA 87 01/27/2015 1450   GFRAA >60 11/01/2016 0817   GFRAA >89 01/27/2015 1450   RADIOGRAPHIC STUDIES:  No results found.  ASSESSMENT and THERAPY PLAN:   Small cell lung cancer [facility]) This is a very pleasant 56 year old white male with history of extensive stage small cell lung cancer diagnosed in September 2016 status post systemic chemotherapy with carboplatin and etoposide completed in December 2016. He also received 2 doses of carboplatin and irinotecan discontinued February 2017. The patient had disease recurrence in March 2018. He was treated with 2 more cycles with carboplatin and irinotecan with no response followed by 1 cycle of treatment with Yervoy and Opdivo.  The patient was then referred to hospice care. He and his family were not in agreement with the decision and seeking more treatment options. The patient is now on treatment with carboplatin for an AUC of 5 and etoposide 80 mg/m given days 1 through 3. Status post 1 cycle.  The patient was seen with Dr. Julien Nordmann. Labs reviewed. Sodium is slightly improved. Discussed with the patient that sodium should continue to improve with treatment. Restaging scans will be performed after his second cycle of chemotherapy.  Discussed with patient that he needs to contact us if he has any increased hemoptysis.   The patient will complete his radiation which is scheduled through early next week.Continue weekly labs.  Plan is for  the patient to return on September 4 for evaluation prior to cycle 2 of his chemotherapy.   No orders of the defined types were placed in this encounter.   All questions were answered. The patient knows to call the clinic with any problems, questions or concerns. We can certainly see the patient much sooner if  necessary.  Mikey Bussing, NP 12/13/2016   ADDENDUM: Hematology/Oncology Attending: I had a face to face encounter with the patient. I recommended his care plan. This is a very pleasant 56 years old white male with recurrent and metastatic small cell lung cancer that was initially diagnosed in September 2016 status post several chemotherapy regimens as well as radiation. He had evidence for disease progression recently. The patient is currently undergoing systemic chemotherapy with carboplatin and reduced dose etoposide status post 1 cycle and tolerated the first cycle of this treatment fairly well with no significant adverse effects. He is also undergoing palliative radiation to the large right lung mass and his breathing has significantly improved in the interval. I recommended for the patient to continue his current treatment with chemotherapy as well as radiation. We will see him back for follow-up visit in less than 2 weeks with the start of the next cycle of his treatment. The patient was advised to call immediately if he has any concerning symptoms in the interval. Disclaimer: This note was dictated with voice recognition software. Similar sounding words can inadvertently be transcribed and may be missed upon review. Eilleen Kempf, MD 12/14/16

## 2016-12-13 NOTE — Assessment & Plan Note (Addendum)
This is a very pleasant 56 year old white male with history of extensive stage small cell lung cancer diagnosed in September 2016 status post systemic chemotherapy with carboplatin and etoposide completed in December 2016. He also received 2 doses of carboplatin and irinotecan discontinued February 2017. The patient had disease recurrence in March 2018. He was treated with 2 more cycles with carboplatin and irinotecan with no response followed by 1 cycle of treatment with Yervoy and Opdivo.  The patient was then referred to hospice care. He and his family were not in agreement with the decision and seeking more treatment options. The patient is now on treatment with carboplatin for an AUC of 5 and etoposide 80 mg/m given days 1 through 3. Status post 1 cycle.  The patient was seen with Dr. Julien Nordmann. Labs reviewed. Sodium is slightly improved. Discussed with the patient that sodium should continue to improve with treatment. Restaging scans will be performed after his second cycle of chemotherapy.  Discussed with patient that he needs to contact us if he has any increased hemoptysis.   The patient will complete his radiation which is scheduled through early next week.Continue weekly labs.  Plan is for the patient to return on September 4 for evaluation prior to cycle 2 of his chemotherapy.

## 2016-12-17 ENCOUNTER — Telehealth: Payer: Self-pay

## 2016-12-17 ENCOUNTER — Other Ambulatory Visit: Payer: Self-pay | Admitting: Internal Medicine

## 2016-12-17 ENCOUNTER — Other Ambulatory Visit: Payer: Self-pay

## 2016-12-17 ENCOUNTER — Telehealth: Payer: Self-pay | Admitting: *Deleted

## 2016-12-17 DIAGNOSIS — C3490 Malignant neoplasm of unspecified part of unspecified bronchus or lung: Secondary | ICD-10-CM

## 2016-12-17 DIAGNOSIS — C787 Secondary malignant neoplasm of liver and intrahepatic bile duct: Secondary | ICD-10-CM

## 2016-12-17 MED ORDER — OXYCODONE-ACETAMINOPHEN 5-325 MG PO TABS: 1.0000 | ORAL_TABLET | ORAL | 0 refills | Status: DC | PRN

## 2016-12-17 NOTE — Telephone Encounter (Signed)
Called and left a message with new appt times on 9/4 per 8/27 inbasket  Hadassah Rana

## 2016-12-17 NOTE — Telephone Encounter (Signed)
Oncology Nurse Navigator Documentation  Oncology Nurse Navigator Flowsheets 12/17/2016  Navigator Location [facility]  Navigator Encounter Type Telephone  Telephone Outgoing Call;Incoming Call/I received a call from [facility] with an update on patient.  He has complaints of pain and needs refill on pain medication.  He did receive laxatives from [facility] to help with constipation.  I updated Dr. Julien Nordmann and he refilled pain medication.  I will take prescription to nurse.   Treatment Phase Treatment  Barriers/Navigation Needs Coordination of Care;Education  Education Other  Interventions Coordination of Care;Education  Coordination of Care Other  Education Method Verbal  Acuity Level 2  Time Spent with Patient 30

## 2016-12-25 ENCOUNTER — Other Ambulatory Visit: Payer: Self-pay

## 2016-12-25 ENCOUNTER — Ambulatory Visit: Payer: Self-pay | Admitting: Oncology

## 2016-12-25 ENCOUNTER — Telehealth: Payer: Self-pay | Admitting: *Deleted

## 2016-12-25 ENCOUNTER — Ambulatory Visit: Payer: Self-pay

## 2016-12-25 NOTE — Telephone Encounter (Signed)
Called pt wife regarding missed appts 9/4  Pt wife explained he is unable to come to any appts prior to 9am as they only have 1 car and must take children to school in the morning along with living approximately 30-40 miles out of town. Also  The automated system and mychart gave pt and wife different times for his appts 9/4.   Message sent to Scheduling  to reschedule pt's appts this week as he missed today's appts and will need a total of 3 infusions this week..  Returned call to pt and wife, unable to reach. LMOVM advised pt has an appt 9/5 at 1030 lab and 11am provider.  We are still currently working on a chemotherapy appts for 9/5,9/6 and  9/7.

## 2016-12-26 ENCOUNTER — Encounter: Payer: Self-pay | Admitting: Oncology

## 2016-12-26 ENCOUNTER — Ambulatory Visit (HOSPITAL_BASED_OUTPATIENT_CLINIC_OR_DEPARTMENT_OTHER): Payer: Self-pay | Admitting: Oncology

## 2016-12-26 ENCOUNTER — Other Ambulatory Visit (HOSPITAL_BASED_OUTPATIENT_CLINIC_OR_DEPARTMENT_OTHER): Payer: Self-pay

## 2016-12-26 ENCOUNTER — Telehealth: Payer: Self-pay | Admitting: Internal Medicine

## 2016-12-26 ENCOUNTER — Ambulatory Visit (HOSPITAL_BASED_OUTPATIENT_CLINIC_OR_DEPARTMENT_OTHER): Payer: Self-pay

## 2016-12-26 VITALS — BP 103/66 | HR 102 | Temp 98.9°F | Resp 18 | Ht 69.0 in | Wt 183.4 lb

## 2016-12-26 DIAGNOSIS — C3490 Malignant neoplasm of unspecified part of unspecified bronchus or lung: Secondary | ICD-10-CM

## 2016-12-26 DIAGNOSIS — C3411 Malignant neoplasm of upper lobe, right bronchus or lung: Secondary | ICD-10-CM

## 2016-12-26 DIAGNOSIS — C787 Secondary malignant neoplasm of liver and intrahepatic bile duct: Secondary | ICD-10-CM

## 2016-12-26 DIAGNOSIS — C7951 Secondary malignant neoplasm of bone: Secondary | ICD-10-CM

## 2016-12-26 DIAGNOSIS — Z5111 Encounter for antineoplastic chemotherapy: Secondary | ICD-10-CM

## 2016-12-26 DIAGNOSIS — K8689 Other specified diseases of pancreas: Secondary | ICD-10-CM

## 2016-12-26 LAB — COMPREHENSIVE METABOLIC PANEL
ALBUMIN: 3 g/dL — AB (ref 3.5–5.0)
ALK PHOS: 113 U/L (ref 40–150)
ALT: 9 U/L (ref 0–55)
ANION GAP: 10 meq/L (ref 3–11)
AST: 24 U/L (ref 5–34)
BILIRUBIN TOTAL: 0.39 mg/dL (ref 0.20–1.20)
BUN: 12.7 mg/dL (ref 7.0–26.0)
CO2: 27 meq/L (ref 22–29)
Calcium: 9.3 mg/dL (ref 8.4–10.4)
Chloride: 97 mEq/L — ABNORMAL LOW (ref 98–109)
Creatinine: 1.1 mg/dL (ref 0.7–1.3)
EGFR: 77 mL/min/{1.73_m2} — AB (ref >90)
Glucose: 113 mg/dl (ref 70–140)
POTASSIUM: 3.9 meq/L (ref 3.5–5.1)
Sodium: 134 mEq/L — ABNORMAL LOW (ref 136–145)
TOTAL PROTEIN: 7.5 g/dL (ref 6.4–8.3)

## 2016-12-26 LAB — CBC WITH DIFFERENTIAL/PLATELET
BASO%: 0.1 % (ref 0.0–2.0)
Basophils Absolute: 0 10*3/uL (ref 0.0–0.1)
EOS%: 1.1 % (ref 0.0–7.0)
Eosinophils Absolute: 0.1 10*3/uL (ref 0.0–0.5)
HCT: 27.7 % — ABNORMAL LOW (ref 38.4–49.9)
HGB: 8.9 g/dL — ABNORMAL LOW (ref 13.0–17.1)
LYMPH%: 6.2 % — AB (ref 14.0–49.0)
MCH: 27.6 pg (ref 27.2–33.4)
MCHC: 32.1 g/dL (ref 32.0–36.0)
MCV: 85.8 fL (ref 79.3–98.0)
MONO#: 1 10*3/uL — AB (ref 0.1–0.9)
MONO%: 12.2 % (ref 0.0–14.0)
NEUT%: 80.4 % — AB (ref 39.0–75.0)
NEUTROS ABS: 6.6 10*3/uL — AB (ref 1.5–6.5)
PLATELETS: 244 10*3/uL (ref 140–400)
RBC: 3.23 10*6/uL — AB (ref 4.20–5.82)
RDW: 17.3 % — ABNORMAL HIGH (ref 11.0–14.6)
WBC: 8.2 10*3/uL (ref 4.0–10.3)
lymph#: 0.5 10*3/uL — ABNORMAL LOW (ref 0.9–3.3)

## 2016-12-26 MED ORDER — DEXAMETHASONE SODIUM PHOSPHATE 10 MG/ML IJ SOLN
INTRAMUSCULAR | Status: AC
  Filled 2016-12-26: qty 1

## 2016-12-26 MED ORDER — PALONOSETRON HCL INJECTION 0.25 MG/5ML
INTRAVENOUS | Status: AC
  Filled 2016-12-26: qty 5

## 2016-12-26 MED ORDER — CARBOPLATIN CHEMO INTRADERMAL TEST DOSE 100MCG/0.02ML
100.0000 ug | Freq: Once | INTRADERMAL | Status: AC
  Administered 2016-12-26: 100 ug via INTRADERMAL
  Filled 2016-12-26: qty 0.02

## 2016-12-26 MED ORDER — SODIUM CHLORIDE 0.9 % IV SOLN
486.0000 mg | Freq: Once | INTRAVENOUS | Status: AC
  Administered 2016-12-26: 490 mg via INTRAVENOUS
  Filled 2016-12-26: qty 49

## 2016-12-26 MED ORDER — OXYCODONE-ACETAMINOPHEN 5-325 MG PO TABS: 1.0000 | ORAL_TABLET | ORAL | 0 refills | Status: DC | PRN

## 2016-12-26 MED ORDER — SODIUM CHLORIDE 0.9 % IV SOLN
Freq: Once | INTRAVENOUS | Status: AC
  Administered 2016-12-26: 13:00:00 via INTRAVENOUS

## 2016-12-26 MED ORDER — PALONOSETRON HCL INJECTION 0.25 MG/5ML
0.2500 mg | Freq: Once | INTRAVENOUS | Status: AC
  Administered 2016-12-26: 0.25 mg via INTRAVENOUS

## 2016-12-26 MED ORDER — DEXAMETHASONE SODIUM PHOSPHATE 10 MG/ML IJ SOLN
10.0000 mg | Freq: Once | INTRAMUSCULAR | Status: AC
  Administered 2016-12-26: 10 mg via INTRAVENOUS

## 2016-12-26 MED ORDER — SODIUM CHLORIDE 0.9 % IV SOLN
90.0000 mg/m2 | Freq: Once | INTRAVENOUS | Status: AC
  Administered 2016-12-26: 190 mg via INTRAVENOUS
  Filled 2016-12-26: qty 9.5

## 2016-12-26 NOTE — Patient Instructions (Signed)
[facility] Discharge Instructions for Patients Receiving Chemotherapy  Today you received the following chemotherapy agents: Etoposide and Carboplatin   To help prevent nausea and vomiting after your treatment, we encourage you to take your nausea medication as directed.    If you develop nausea and vomiting that is not controlled by your nausea medication, call the clinic.   BELOW ARE SYMPTOMS THAT SHOULD BE REPORTED IMMEDIATELY:  *FEVER GREATER THAN 100.5 F  *CHILLS WITH OR WITHOUT FEVER  NAUSEA AND VOMITING THAT IS NOT CONTROLLED WITH YOUR NAUSEA MEDICATION  *UNUSUAL SHORTNESS OF BREATH  *UNUSUAL BRUISING OR BLEEDING  TENDERNESS IN MOUTH AND THROAT WITH OR WITHOUT PRESENCE OF ULCERS  *URINARY PROBLEMS  *BOWEL PROBLEMS  UNUSUAL RASH Items with * indicate a potential emergency and should be followed up as soon as possible.  Feel free to call the clinic you have any questions or concerns. The clinic phone number is [phone].  Please show the [facility] at check-in to the Emergency Department and triage nurse.

## 2016-12-26 NOTE — Telephone Encounter (Signed)
Left message with daughter - appt time changed for 9/6 appt per patient request - daughter to relay message

## 2016-12-26 NOTE — Progress Notes (Signed)
[facility] Follow up:    [name], MD [address] [place] [address]   DIAGNOSIS: Cancer Staging Small cell lung cancer [facility]) Staging form: Lung, AJCC 7th Edition - Clinical stage from 02/21/2015: Stage IV (T4, N2, M1b) - Signed by [facility], PA-C on 02/21/2015   Extensive stage small cell lung cancer diagnosed in September 2016 status post systemic chemotherapy with carboplatin and etoposide completed in December 2016.   SUMMARY OF ONCOLOGIC HISTORY:   Small cell lung cancer ([place])   01/18/2015 Imaging    Chest Xray- 6.8 cm x 7.6 cm RIGHT hilar mass most compatible with bronchogenic carcinoma.       01/18/2015 Imaging    CT chest- 8.7 x 8.7 cm R hilar mass encasing the R pulm artery and occluding the R upper lobe bronchus. Sm R pleural effusion; malignant or reactive. 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 34 mm x 26 mm mass in body of pancreas      01/21/2015 Imaging    MRI brain- No acute intracranial findings. No signs of metastatic disease. Minor white matter disease, stable and nonspecific.      01/26/2015 Pathology Results    Lung, biopsy, right upper lobe - SMALL CELL CARCINOMA.      01/26/2015 Pathology Results    Diagnosis FINE NEEDLE ASPIRATION: ENDOSCOPIC SPECIMEN A, EBUS LEVEL 7 NODE (SPECIMEN 1 OF 3 COLLECTED 01/26/2015) MALIGNANT CELLS PRESENT, CONSISTENT WITH SMALL CELL CARCINOMA.      01/26/2015 Pathology Results    WANG NEEDLE ASPIRATION, SPECIMEN B LEVEL 7 NODE (SPECIMEN 2 OF 3 COLLECTED 01/26/2015) MALIGNANT CELLS PRESENT, CONSISTENT WITH SMALL CELL CARCINOMA.      01/26/2015 Pathology Results    Diagnosis BRONCHIAL BRUSHING SPECIMEN C, RIGHT UPPER LOBE (SPECIMEN 3 OF 3 COLLECTED 01/26/2015) ATYPICAL CELLS PRESENT.      01/27/2015 PET scan    Large hypermetabolic right hilar mass with mediastinal invasion and infrahilar extension, maximum standard uptake value 13.0.  Pancreatic tail  mass, approximately 4.5 cm in diameter. Abdominal aortic aneurysm, 5.8 cm in diameter.      02/10/2015 Pathology Results    Diagnosis FINE NEEDLE ASPIRATION, ENDOSCOPIC, PANCREAS TAIL (SPECIMEN 1 OF 1 COLLECTED 02/10/15): MALIGNANT CELLS CONSISTENT WITH METASTATIC SMALL CELL CARCINOMA.      02/14/2015 - 03/06/2015 Chemotherapy    Cisplatin/Etoposide      03/06/2015 Treatment Plan Change    Change systemic therapy to Carboplatin based      03/07/2015 - 05/10/2015 Chemotherapy    Carboplatin/Etoposide.      04/13/2015 Imaging    resolution of pancreatic metastases, decrease in size of R perihilar mss and RUL mass. Interval response to therapy      05/10/2015 - 05/31/2015 Chemotherapy    Change to carboplatin/irinotecan for cycle #5 and #6 secondary to significant skin toxicity presumably from etoposide      07/12/2015 -  Radiation Therapy    25 Gy to whole brain and 30 Gy to R hilar region by Dr. Lisbeth Renshaw.      09/13/2015 Imaging    CT C/A/P unchanged from 2/28. persistent prominence of peribronchovascular intersitium throughout R lung, in perihilar region, no definitive findings to suggest progression of disease on exam      11/03/2015 Imaging    MRI brain 3 mm area of restricted diffusion R posterior temporal lobe without abnormal enhancement favor acute infarct, mild dural thickening enhancement over R hemisphere now present previously  11/15/2015 Imaging    CT C/A/P Interval development of 1.6 x 1.4 cm L adrenal nodule suspicious for metastatic lesion. No other sites of new metastatic disease are noted elsewhere in C/A/P. Evolving postradiation changes in R lung      12/01/2015 - 12/14/2015 Radiation Therapy    Radiation therapy to L adrenal nodule      01/18/2016 Imaging    CT C/A/P 1. Stable appearance of the chest. Specifically, there are post XRT findings in the right lung with perihilar and hilar airway thickening, some upper lobe airway plugging, and scattered densities  which merit observation but which are likely therapy rib related. 2. Centrilobular emphysema  3. Coronary, aortic arch, and branch vessel atherosclerotic vascular disease 4. On prior PET-CT from 01/27/2015 there was a pancreatic body mass. Prior biopsy from 02/10/2015 showed this to be consistent with metastatic small cell carcinoma. This mass is not visible on today's CT scan, and has presumably resolved with treatment. There is a transition from a somewhat atrophic pancreatic tail to a normal caliber pancreatic body occurring in the vicinity of the prior mass. 5. Reduced size of a small left adrenal mass, currently 1.2 by 1.7 cm 6. Infrarenal abdominal aortic aneurysm up to 5.8 cm diameter, with patent aorta bi-iliac stent graft in place and no evidence of endoleak 7. Lumbar spondylosis and degenerative disc disease with mild impingement at L3-4 and L4-5      02/02/2016 Imaging    No evidence of metastatic disease to the brain.Resolution of small right hemispheric fluid collection. No leptomeningeal enhancement identified on today's study. This finding may have been due to a small subdural hematoma previously which has resolved.Negative for acute infarct. Small area of restricted diffusion in the right posterior temporal lobe on the prior study likely was an area of acute infarct which has resolved.      04/13/2016 Imaging    CT CAP- 1. Relatively similar appearance of areas of right-sided radiation change. No well-defined residual or recurrent disease identified. 2. No thoracic adenopathy. 3. No evidence of metastatic disease in the abdomen or pelvis.      07/10/2016 Imaging    MRI brain- 1. No evidence of intracranial metastatic disease within limitations of mild motion artifact. 2. Unchanged subcentimeter lesion in the clivus. Small osseous metastasis not excluded.      07/20/2016 Imaging    CT CAP- 9.5 x 4.6 cm central right upper lobe mass extending to the perihilar region, new,  compatible with lung cancer recurrence. Associated subpleural nodularity in the right upper lobe, suspicious for metastatic disease.  Associated mild mediastinal lymphadenopathy, compatible with nodal metastases.  Multifocal hepatic lesions in both lobes, new, compatible with hepatic metastases, with index lesions as above.  Additional ancillary findings as above.      07/20/2016 Relapse/Recurrence    Last Treatment Date: 12/14/15 XRT; 06/10/15 Chemotherapy Recent Lab Values: Recent Lab Values:  N/A      08/23/2016 Imaging    c/o RLE pain. Doppler ultrasound done and referred to vascular surgeon: IMPRESSION: No evidence of DVT with the right lower extremity. 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 Arterial Doppler or CT angiogram is recommended for further evaluation.      09/28/2016 Progression    Restaging CT C/A/P:  Stable slight interval increase in size of large right upper lobe mass extending into the right hilum.  There is increased ground-glass nodularity peripherally predominately within the right upper lobe and involving the right  middle lobe which may represent superimposed infectious/inflammatory process. Disease progression in this location is not excluded. Recommend attention on followup.  Interval increase in size of multiple hepatic metastatic lesions.  Stable to mild interval decrease in size of mediastinal adenopathy.      PRIOR THERAPY:  He also received 2 doses of carboplatin and irinotecan discontinued February 2017. The patient had disease recurrence in March 2018. He was treated with 2 more cycles with carboplatin and irinotecan with no response followed by 1 cycle of treatment with Yervoy and Opdivo.  The patient was then referred to hospice care. He and his family were not in agreement with the decision and seeking more treatment options.  CURRENT THERAPY: Carboplatin for an AUC of 5 with etoposide 80  mg meter squared. Carboplatin is given on day 1 and etoposide given on days 1 through 3. Started on 12/03/2016. The patient is status post 1 cycle.  INTERVAL HISTORY: [name] 56 y.o. male returns for routine follow-up with his wife. Patient completed radiation therapy last week. He reports that his breathing is stable, but his hemoptysis has resolved. He denies having any fevers or chills. He reports mild fatigue. Denies chest pain and shortness of breath at rest. He still has dyspnea on exertion. Denies abdominal pain, nausea, vomiting, constipation, diarrhea. Appetite has been fair, but he has lost weight. Patient's wife states that he will not drink boost supplements. Pain is overall controlled with Percocet. He now takes only about 2 tablets per day which is a reduction. The patient is here for evaluation prior to cycle 2 of his chemotherapy.   Patient Active Problem List   Diagnosis Date Noted  . Encounter for antineoplastic chemotherapy 11/24/2016  . Goals of care, counseling/discussion 11/24/2016  . AAA (abdominal aortic aneurysm) without rupture ([place]) 08/11/2015  . Small cell lung cancer ([place]) 02/07/2015  . Mass of pancreas   . Lung mass-right 01/19/2015  . Pancreatic mass 01/19/2015  . COPD (chronic obstructive pulmonary disease) ([place]) 11/27/2010  . Tobacco abuse 11/22/2010  . Mixed hyperlipidemia 11/10/2010  . HYPERTENSION, BENIGN 01/10/2009  . CAD, NATIVE VESSEL 01/10/2009    is allergic to lisinopril and tea.  MEDICAL HISTORY: Past Medical History:  Diagnosis Date  . AAA (abdominal aortic aneurysm) ([place])   . Arthritis   . COPD (chronic obstructive pulmonary disease) ([place])   . Coronary artery disease 2002   Lesion in LAD s/p angioplasty; s/p CABG x 4 Sept 2013  . Encounter for antineoplastic chemotherapy 11/24/2016  . Essential hypertension, benign   . Hyperlipidemia   . Myocardial infarct ([place]) 1999, 2002  . Pancreatitis   . Small cell lung cancer ([place])    Stage  IIIB with extension the pancreas    SURGICAL HISTORY: Past Surgical History:  Procedure Laterality Date  . ABDOMINAL AORTIC ENDOVASCULAR STENT GRAFT N/A 08/11/2015   Procedure: ABDOMINAL AORTIC ENDOVASCULAR STENT GRAFT;  Surgeon: Serafina Mitchell, MD;  Location: [facility] OR;  Service: Vascular;  Laterality: N/A;  . [place], 2002  . CORONARY ARTERY BYPASS GRAFT  01/02/2012   Procedure: CORONARY ARTERY BYPASS GRAFTING (CABG);  Surgeon: [name], MD;  Location: [facility];  Service: Open Heart Surgery;  Laterality: N/A;  times three using left internal mammary artery and right endoscopically harvested saphenous vein  . EUS N/A 02/10/2015   Procedure: UPPER ENDOSCOPIC ULTRASOUND (EUS) LINEAR;  Surgeon: [name], MD;  Location: [facility] ENDOSCOPY;  Service: Endoscopy;  Laterality: N/A;  . KNEE SURGERY  Left    ACL repair  . LEFT HEART CATHETERIZATION WITH CORONARY ANGIOGRAM N/A 01/01/2012   Procedure: LEFT HEART CATHETERIZATION WITH CORONARY ANGIOGRAM;  Surgeon: Hillary Bow, MD;  Location: [facility] CATH LAB;  Service: Cardiovascular;  Laterality: N/A;  . LUNG BIOPSY N/A 01/26/2015   Procedure: LUNG BIOPSY;  Surgeon: [name], MD;  Location: [facility];  Service: Thoracic;  Laterality: N/A;  . LYMPH NODE BIOPSY N/A 01/26/2015   Procedure: LYMPH NODE BIOPSY;  Surgeon: [name], MD;  Location: [facility];  Service: Thoracic;  Laterality: N/A;  . MULTIPLE EXTRACTIONS WITH ALVEOLOPLASTY  02/21/2012   Procedure: MULTIPLE EXTRACION WITH ALVEOLOPLASTY;  Surgeon: Lenn Cal, DDS;  Location: [facility] ORS;  Service: Oral Surgery;  Laterality: N/A;  Extaction of tooth #'s 4,5,6,7,10,11,12,13,14,15,19,20,21,22,23,24,25,   . PORTACATH PLACEMENT Left 02/01/2015   Procedure: ATTEMPTED INSERTION OF PORT-A-CATH;  Surgeon: [name], MD;  Location: [facility];  Service: Thoracic;  Laterality: Left;  . TEE WITHOUT CARDIOVERSION  01/02/2012   Procedure: TRANSESOPHAGEAL ECHOCARDIOGRAM (TEE);   Surgeon: [name], MD;  Location: [facility];  Service: Open Heart Surgery;  Laterality: N/A;  . VIDEO BRONCHOSCOPY WITH ENDOBRONCHIAL ULTRASOUND N/A 01/26/2015   Procedure: VIDEO BRONCHOSCOPY WITH ENDOBRONCHIAL ULTRASOUND;  Surgeon: [name], MD;  Location: [facility];  Service: Thoracic;  Laterality: N/A;    SOCIAL HISTORY: Social History   Social History  . Marital status: Married    Spouse name: N/A  . Number of children: 5  . Years of education: N/A   Occupational History  . Works in [employer]  . Smoking status: Current Every Day Smoker    Packs/day: 2.00    Years: 35.00    Types: Cigarettes    Start date: 06/30/1976  . Smokeless tobacco: Never Used  . Alcohol use No     Comment: Maybe a 12 pack/mth if that - very rare since 2013  . Drug use: No  . Sexual activity: Yes   Other Topics Concern  . Not on file   Social History Narrative  . No narrative on file    FAMILY HISTORY: Family History  Problem Relation Age of Onset  . Heart disease Father   . Diabetes Father   . Hyperlipidemia Father   . Hypertension Father   . Hyperlipidemia Mother   . Hypertension Mother   . Stroke Mother   . AAA (abdominal aortic aneurysm) Mother   . Diabetes Paternal Grandmother   . Cancer Neg Hx     Review of Systems  Constitutional: Positive for appetite change, fatigue and unexpected weight change. Negative for chills and fever.  HENT:  Negative.   Eyes: Negative.   Respiratory: Positive for cough. Negative for hemoptysis.        Shortness of breath at rest.  Cardiovascular: Negative.   Gastrointestinal: Negative.   Genitourinary: Negative.    Musculoskeletal: Negative for back pain and neck pain.       Right shoulder pain.  Skin: Negative.   Neurological: Negative.   Hematological: Negative.   Psychiatric/Behavioral: Negative.       PHYSICAL EXAMINATION  ECOG PERFORMANCE STATUS: 1 - Symptomatic but completely  ambulatory  Vitals:   12/26/16 1119  BP: 103/66  Pulse: (!) 102  Resp: 18  Temp: 98.9 F (37.2 C)  SpO2: 98%    Physical Exam  Constitutional: He is oriented to person, place, and time and well-developed, well-nourished, and in no distress.  No distress.  HENT:  Head: Normocephalic.  Mouth/Throat: Oropharynx is clear and moist. No oropharyngeal exudate.  Eyes: Conjunctivae are normal. No scleral icterus.  Neck: Normal range of motion. Neck supple.  Cardiovascular: Normal rate, regular rhythm, normal heart sounds and intact distal pulses.   Pulmonary/Chest: Effort normal and breath sounds normal. No respiratory distress. He has no wheezes. He has no rales.  Abdominal: Soft. Bowel sounds are normal. He exhibits no distension and no mass. There is no tenderness.  Musculoskeletal: Normal range of motion. He exhibits no edema.  Lymphadenopathy:    He has no cervical adenopathy.  Neurological: He is alert and oriented to person, place, and time. Gait normal.  Skin: Skin is warm and dry. No rash noted. He is not diaphoretic. No erythema. No pallor.  Psychiatric: Mood, memory, affect and judgment normal.    LABORATORY DATA:  CBC    Component Value Date/Time   WBC 8.2 12/26/2016 1044   WBC 5.2 11/01/2016 0817   RBC 3.23 (L) 12/26/2016 1044   RBC 4.04 (L) 11/01/2016 0817   HGB 8.9 (L) 12/26/2016 1044   HCT 27.7 (L) 12/26/2016 1044   PLT 244 12/26/2016 1044   MCV 85.8 12/26/2016 1044   MCH 27.6 12/26/2016 1044   MCH 30.2 11/01/2016 0817   MCHC 32.1 12/26/2016 1044   MCHC 33.6 11/01/2016 0817   RDW 17.3 (H) 12/26/2016 1044   LYMPHSABS 0.5 (L) 12/26/2016 1044   MONOABS 1.0 (H) 12/26/2016 1044   EOSABS 0.1 12/26/2016 1044   BASOSABS 0.0 12/26/2016 1044    CMP     Component Value Date/Time   NA 134 (L) 12/26/2016 1044   K 3.9 12/26/2016 1044   CL 97 (L) 11/01/2016 0817   CO2 27 12/26/2016 1044   GLUCOSE 113 12/26/2016 1044   BUN 12.7 12/26/2016 1044   CREATININE 1.1  12/26/2016 1044   CALCIUM 9.3 12/26/2016 1044   PROT 7.5 12/26/2016 1044   ALBUMIN 3.0 (L) 12/26/2016 1044   AST 24 12/26/2016 1044   ALT 9 12/26/2016 1044   ALKPHOS 113 12/26/2016 1044   BILITOT 0.39 12/26/2016 1044   GFRNONAA >60 11/01/2016 0817   GFRNONAA 87 01/27/2015 1450   GFRAA >60 11/01/2016 0817   GFRAA >89 01/27/2015 1450    RADIOGRAPHIC STUDIES:  No results found.  ASSESSMENT and THERAPY PLAN:   Small cell lung cancer [facility]) This is a very pleasant 57 year old white male with history of extensive stage small cell lung cancer diagnosed in September 2016 status post systemic chemotherapy with carboplatin and etoposide completed in December 2016. He also received 2 doses of carboplatin and irinotecan discontinued February 2017. The patient had disease recurrence in March 2018. He was treated with 2 more cycles with carboplatin and irinotecan with no response followed by 1 cycle of treatment with Yervoy and Opdivo.  The patient was then referred to hospice care. He and his family were not in agreement with the decision and seeking more treatment options. The patient is now on treatment with carboplatin for an AUC of 5 and etoposide 80 mg/m given days 1 through 3. Status post 1 cycle.  The patient was seen with Dr. Julien Nordmann. Labs reviewed. Sodium continues to improve. Hemoglobin is down slightly to 8.9. Recommend that he proceed with cycle 2 of his chemotherapy today as scheduled. Carboplatin dose will be for an AUC of 4 and etoposide has been changed to 90 mg meter squared. Will change Neulasta over to OnPro to minimize  the number trips the patient needs to make here.  The patient will need weekly labs and orders have been placed for him to have labs closer to home at [facility].  A restaging CT scan of the chest, abdomen, and pelvis have been ordered prior to his next visit.  The patient was given a refill of his Percocet today.  Plan is for the patient to return in  3 weeks for evaluation prior to cycle 3 of his chemotherapy and to review his restaging CT scan results.   Orders Placed This Encounter  Procedures  . CT CHEST W CONTRAST    Standing Status:   Future    Standing Expiration Date:   12/26/2017    Order Specific Question:   If indicated for the ordered procedure, I authorize the administration of contrast media per Radiology protocol    Answer:   Yes    Order Specific Question:   Preferred imaging location?    Answer:   [facility]    Order Specific Question:   Radiology Contrast Protocol - do NOT remove file path    Answer:   \\[email or web address]    Order Specific Question:   Reason for Exam additional comments    Answer:   extensive stage small cell lung ca. Restaging.  Marland Kitchen CT ABDOMEN PELVIS W CONTRAST    Standing Status:   Future    Standing Expiration Date:   12/26/2017    Order Specific Question:   If indicated for the ordered procedure, I authorize the administration of contrast media per Radiology protocol    Answer:   Yes    Order Specific Question:   Preferred imaging location?    Answer:   [facility]    Order Specific Question:   Radiology Contrast Protocol - do NOT remove file path    Answer:   [email or web address]    Order Specific Question:   Reason for Exam additional comments    Answer:   extensive stage small cell lung ca. Restaging.  Marland Kitchen CBC with Differential/Platelet    Standing Status:   Future    Standing Expiration Date:   12/26/2017  . Comprehensive metabolic panel    Standing Status:   Future    Standing Expiration Date:   12/26/2017  . CBC with Differential/Platelet    Standing Status:   Future    Standing Expiration Date:   12/26/2017  . Comprehensive metabolic panel    Standing Status:   Future    Standing Expiration Date:   12/26/2017  . Hold Tube, Blood Bank    Standing Status:   Future    Standing Expiration Date:   12/26/2017  . Hold Tube, Blood Bank     Standing Status:   Future    Standing Expiration Date:   12/26/2017    All questions were answered. The patient knows to call the clinic with any problems, questions or concerns. We can certainly see the patient much sooner if necessary.  Mikey Bussing, NP 12/26/2016   ADDENDUM: Hematology/Oncology Attending: I had a face to face encounter with the patient. I recommended his care plan. This is a very pleasant 56 years old white male with recurrent small cell lung cancer with large mass in the right upper lobe as well as metastatic disease to the liver and bone. He is status post palliative course of radiotherapy to the large mass in the right lung. His breathing is much better. He is currently undergoing systemic chemotherapy  systemic chemotherapy with reduced dose carboplatin and paclitaxel is status post 1 cycle. He tolerated the first cycle fairly well with no significant adverse effects. I recommended for the patient to proceed with cycle #2 today as scheduled but I will increase the dose of etoposide to 90 MG/M2 on days 1, 2 and 3 since patient tolerated his first cycle fairly well. I would see him back for follow-up visit in 3 weeks for evaluation after repeating CT scan of the chest, abdomen and pelvis for restaging of his disease. For pain management, the patient will continue with his current pain medication. The patient was advised to call immediately if he has any concerning symptoms in the interval. Disclaimer: This note was dictated with voice recognition software. Similar sounding words can inadvertently be transcribed and may be missed upon review. Eilleen Kempf, MD 12/29/16

## 2016-12-26 NOTE — Assessment & Plan Note (Addendum)
This is a very pleasant 56 year old white male with history of extensive stage small cell lung cancer diagnosed in September 2016 status post systemic chemotherapy with carboplatin and etoposide completed in December 2016. He also received 2 doses of carboplatin and irinotecan discontinued February 2017. The patient had disease recurrence in March 2018. He was treated with 2 more cycles with carboplatin and irinotecan with no response followed by 1 cycle of treatment with Yervoy and Opdivo.  The patient was then referred to hospice care. He and his family were not in agreement with the decision and seeking more treatment options. The patient is now on treatment with carboplatin for an AUC of 5 and etoposide 80 mg/m given days 1 through 3. Status post 1 cycle.  The patient was seen with Dr. Julien Nordmann. Labs reviewed. Sodium continues to improve. Hemoglobin is down slightly to 8.9. Recommend that he proceed with cycle 2 of his chemotherapy today as scheduled. Carboplatin dose will be for an AUC of 4 and etoposide has been changed to 90 mg meter squared. Will change Neulasta over to OnPro to minimize the number trips the patient needs to make here.  The patient will need weekly labs and orders have been placed for him to have labs closer to home at [facility].  A restaging CT scan of the chest, abdomen, and pelvis have been ordered prior to his next visit.  The patient was given a refill of his Percocet today.  Plan is for the patient to return in 3 weeks for evaluation prior to cycle 3 of his chemotherapy and to review his restaging CT scan results.

## 2016-12-27 ENCOUNTER — Ambulatory Visit (HOSPITAL_BASED_OUTPATIENT_CLINIC_OR_DEPARTMENT_OTHER): Payer: Self-pay

## 2016-12-27 DIAGNOSIS — C3490 Malignant neoplasm of unspecified part of unspecified bronchus or lung: Secondary | ICD-10-CM

## 2016-12-27 DIAGNOSIS — C7951 Secondary malignant neoplasm of bone: Secondary | ICD-10-CM

## 2016-12-27 DIAGNOSIS — C787 Secondary malignant neoplasm of liver and intrahepatic bile duct: Secondary | ICD-10-CM

## 2016-12-27 DIAGNOSIS — K8689 Other specified diseases of pancreas: Secondary | ICD-10-CM

## 2016-12-27 DIAGNOSIS — Z5111 Encounter for antineoplastic chemotherapy: Secondary | ICD-10-CM

## 2016-12-27 DIAGNOSIS — C3411 Malignant neoplasm of upper lobe, right bronchus or lung: Secondary | ICD-10-CM

## 2016-12-27 MED ORDER — DEXAMETHASONE SODIUM PHOSPHATE 10 MG/ML IJ SOLN
10.0000 mg | Freq: Once | INTRAMUSCULAR | Status: AC
  Administered 2016-12-27: 10 mg via INTRAVENOUS

## 2016-12-27 MED ORDER — SODIUM CHLORIDE 0.9 % IV SOLN
90.0000 mg/m2 | Freq: Once | INTRAVENOUS | Status: AC
  Administered 2016-12-27: 190 mg via INTRAVENOUS
  Filled 2016-12-27: qty 9.5

## 2016-12-27 MED ORDER — SODIUM CHLORIDE 0.9 % IV SOLN
Freq: Once | INTRAVENOUS | Status: AC
  Administered 2016-12-27: 10:00:00 via INTRAVENOUS

## 2016-12-27 MED ORDER — DEXAMETHASONE SODIUM PHOSPHATE 10 MG/ML IJ SOLN
INTRAMUSCULAR | Status: AC
  Filled 2016-12-27: qty 1

## 2016-12-27 NOTE — Patient Instructions (Signed)
[facility] Discharge Instructions for Patients Receiving Chemotherapy  Today you received the following chemotherapy agents: Etoposide  To help prevent nausea and vomiting after your treatment, we encourage you to take your nausea medication as directed.    If you develop nausea and vomiting that is not controlled by your nausea medication, call the clinic.   BELOW ARE SYMPTOMS THAT SHOULD BE REPORTED IMMEDIATELY:  *FEVER GREATER THAN 100.5 F  *CHILLS WITH OR WITHOUT FEVER  NAUSEA AND VOMITING THAT IS NOT CONTROLLED WITH YOUR NAUSEA MEDICATION  *UNUSUAL SHORTNESS OF BREATH  *UNUSUAL BRUISING OR BLEEDING  TENDERNESS IN MOUTH AND THROAT WITH OR WITHOUT PRESENCE OF ULCERS  *URINARY PROBLEMS  *BOWEL PROBLEMS  UNUSUAL RASH Items with * indicate a potential emergency and should be followed up as soon as possible.  Feel free to call the clinic you have any questions or concerns. The clinic phone number is [phone].  Please show the [facility] at check-in to the Emergency Department and triage nurse.

## 2016-12-28 ENCOUNTER — Ambulatory Visit (HOSPITAL_BASED_OUTPATIENT_CLINIC_OR_DEPARTMENT_OTHER): Payer: Self-pay

## 2016-12-28 VITALS — BP 101/58 | HR 96 | Temp 97.9°F | Resp 18

## 2016-12-28 DIAGNOSIS — C7951 Secondary malignant neoplasm of bone: Secondary | ICD-10-CM

## 2016-12-28 DIAGNOSIS — C787 Secondary malignant neoplasm of liver and intrahepatic bile duct: Secondary | ICD-10-CM

## 2016-12-28 DIAGNOSIS — Z5111 Encounter for antineoplastic chemotherapy: Secondary | ICD-10-CM

## 2016-12-28 DIAGNOSIS — K8689 Other specified diseases of pancreas: Secondary | ICD-10-CM

## 2016-12-28 DIAGNOSIS — C3490 Malignant neoplasm of unspecified part of unspecified bronchus or lung: Secondary | ICD-10-CM

## 2016-12-28 DIAGNOSIS — C3411 Malignant neoplasm of upper lobe, right bronchus or lung: Secondary | ICD-10-CM

## 2016-12-28 MED ORDER — DEXAMETHASONE SODIUM PHOSPHATE 10 MG/ML IJ SOLN
10.0000 mg | Freq: Once | INTRAMUSCULAR | Status: AC
  Administered 2016-12-28: 10 mg via INTRAVENOUS

## 2016-12-28 MED ORDER — SODIUM CHLORIDE 0.9 % IV SOLN
Freq: Once | INTRAVENOUS | Status: AC
  Administered 2016-12-28: 15:00:00 via INTRAVENOUS

## 2016-12-28 MED ORDER — DEXAMETHASONE SODIUM PHOSPHATE 10 MG/ML IJ SOLN
INTRAMUSCULAR | Status: AC
  Filled 2016-12-28: qty 1

## 2016-12-28 MED ORDER — SODIUM CHLORIDE 0.9 % IV SOLN
90.0000 mg/m2 | Freq: Once | INTRAVENOUS | Status: AC
  Administered 2016-12-28: 190 mg via INTRAVENOUS
  Filled 2016-12-28: qty 9.5

## 2016-12-28 NOTE — Patient Instructions (Signed)
[place] Discharge Instructions for Patients Receiving Chemotherapy  Today you received the following chemotherapy agents: Etoposide  To help prevent nausea and vomiting after your treatment, we encourage you to take your nausea medication as directed.    If you develop nausea and vomiting that is not controlled by your nausea medication, call the clinic.   BELOW ARE SYMPTOMS THAT SHOULD BE REPORTED IMMEDIATELY:  *FEVER GREATER THAN 100.5 F  *CHILLS WITH OR WITHOUT FEVER  NAUSEA AND VOMITING THAT IS NOT CONTROLLED WITH YOUR NAUSEA MEDICATION  *UNUSUAL SHORTNESS OF BREATH  *UNUSUAL BRUISING OR BLEEDING  TENDERNESS IN MOUTH AND THROAT WITH OR WITHOUT PRESENCE OF ULCERS  *URINARY PROBLEMS  *BOWEL PROBLEMS  UNUSUAL RASH Items with * indicate a potential emergency and should be followed up as soon as possible.  Feel free to call the clinic you have any questions or concerns. The clinic phone number is [phone].  Please show the [place] at check-in to the Emergency Department and triage nurse.

## 2016-12-29 ENCOUNTER — Ambulatory Visit: Payer: Self-pay

## 2016-12-31 ENCOUNTER — Ambulatory Visit (HOSPITAL_BASED_OUTPATIENT_CLINIC_OR_DEPARTMENT_OTHER): Payer: Self-pay

## 2016-12-31 ENCOUNTER — Other Ambulatory Visit: Payer: Self-pay | Admitting: *Deleted

## 2016-12-31 ENCOUNTER — Telehealth: Payer: Self-pay | Admitting: Internal Medicine

## 2016-12-31 VITALS — BP 108/60 | HR 100 | Temp 97.9°F | Resp 20

## 2016-12-31 DIAGNOSIS — K8689 Other specified diseases of pancreas: Secondary | ICD-10-CM

## 2016-12-31 DIAGNOSIS — C3411 Malignant neoplasm of upper lobe, right bronchus or lung: Secondary | ICD-10-CM

## 2016-12-31 DIAGNOSIS — C7951 Secondary malignant neoplasm of bone: Secondary | ICD-10-CM

## 2016-12-31 DIAGNOSIS — C787 Secondary malignant neoplasm of liver and intrahepatic bile duct: Secondary | ICD-10-CM

## 2016-12-31 DIAGNOSIS — Z5189 Encounter for other specified aftercare: Secondary | ICD-10-CM

## 2016-12-31 DIAGNOSIS — C3490 Malignant neoplasm of unspecified part of unspecified bronchus or lung: Secondary | ICD-10-CM

## 2016-12-31 MED ORDER — PEGFILGRASTIM INJECTION 6 MG/0.6ML ~~LOC~~
6.0000 mg | PREFILLED_SYRINGE | Freq: Once | SUBCUTANEOUS | Status: AC
  Administered 2016-12-31: 6 mg via SUBCUTANEOUS
  Filled 2016-12-31: qty 0.6

## 2016-12-31 MED ORDER — AMLODIPINE BESYLATE 5 MG PO TABS: ORAL_TABLET | ORAL | 3 refills | Status: AC

## 2016-12-31 MED ORDER — PRAVASTATIN SODIUM 40 MG PO TABS: ORAL_TABLET | ORAL | 3 refills | Status: AC

## 2016-12-31 MED ORDER — CARVEDILOL 6.25 MG PO TABS: ORAL_TABLET | ORAL | 3 refills | Status: AC

## 2016-12-31 NOTE — Patient Instructions (Signed)
Pegfilgrastim injection What is this medicine? PEGFILGRASTIM (PEG fil gra stim) is a long-acting granulocyte colony-stimulating factor that stimulates the growth of neutrophils, a type of white blood cell important in the body's fight against infection. It is used to reduce the incidence of fever and infection in patients with certain types of cancer who are receiving chemotherapy that affects the bone marrow, and to increase survival after being exposed to high doses of radiation. This medicine may be used for other purposes; ask your health care provider or pharmacist if you have questions. COMMON BRAND NAME(S): Neulasta What should I tell my health care provider before I take this medicine? They need to know if you have any of these conditions: -kidney disease -latex allergy -ongoing radiation therapy -sickle cell disease -skin reactions to acrylic adhesives (On-Body Injector only) -an unusual or allergic reaction to pegfilgrastim, filgrastim, other medicines, foods, dyes, or preservatives -pregnant or trying to get pregnant -breast-feeding How should I use this medicine? This medicine is for injection under the skin. If you get this medicine at home, you will be taught how to prepare and give the pre-filled syringe or how to use the On-body Injector. Refer to the patient Instructions for Use for detailed instructions. Use exactly as directed. Tell your healthcare provider immediately if you suspect that the On-body Injector may not have performed as intended or if you suspect the use of the On-body Injector resulted in a missed or partial dose. It is important that you put your used needles and syringes in a special sharps container. Do not put them in a trash can. If you do not have a sharps container, call your pharmacist or healthcare provider to get one. Talk to your pediatrician regarding the use of this medicine in children. While this drug may be prescribed for selected conditions,  precautions do apply. Overdosage: If you think you have taken too much of this medicine contact a poison control center or emergency room at once. NOTE: This medicine is only for you. Do not share this medicine with others. What if I miss a dose? It is important not to miss your dose. Call your doctor or health care professional if you miss your dose. If you miss a dose due to an On-body Injector failure or leakage, a new dose should be administered as soon as possible using a single prefilled syringe for manual use. What may interact with this medicine? Interactions have not been studied. Give your health care provider a list of all the medicines, herbs, non-prescription drugs, or dietary supplements you use. Also tell them if you smoke, drink alcohol, or use illegal drugs. Some items may interact with your medicine. This list may not describe all possible interactions. Give your health care provider a list of all the medicines, herbs, non-prescription drugs, or dietary supplements you use. Also tell them if you smoke, drink alcohol, or use illegal drugs. Some items may interact with your medicine. What should I watch for while using this medicine? You may need blood work done while you are taking this medicine. If you are going to need a MRI, CT scan, or other procedure, tell your doctor that you are using this medicine (On-Body Injector only). What side effects may I notice from receiving this medicine? Side effects that you should report to your doctor or health care professional as soon as possible: -allergic reactions like skin rash, itching or hives, swelling of the face, lips, or tongue -dizziness -fever -pain, redness, or irritation at site   where injected -pinpoint red spots on the skin -red or dark-brown urine -shortness of breath or breathing problems -stomach or side pain, or pain at the shoulder -swelling -tiredness -trouble passing urine or change in the amount of urine Side  effects that usually do not require medical attention (report to your doctor or health care professional if they continue or are bothersome): -bone pain -muscle pain This list may not describe all possible side effects. Call your doctor for medical advice about side effects. You may report side effects to FDA at 1-800-FDA-1088. Where should I keep my medicine? Keep out of the reach of children. Store pre-filled syringes in a refrigerator between 2 and 8 degrees C (36 and 46 degrees F). Do not freeze. Keep in carton to protect from light. Throw away this medicine if it is left out of the refrigerator for more than 48 hours. Throw away any unused medicine after the expiration date. NOTE: This sheet is a summary. It may not cover all possible information. If you have questions about this medicine, talk to your doctor, pharmacist, or health care provider.  2018 Elsevier/Gold Standard (2016-04-05 12:58:03)  

## 2016-12-31 NOTE — Telephone Encounter (Signed)
Spoke with patient's wife regarding his appts in October.

## 2017-01-04 ENCOUNTER — Encounter: Payer: Self-pay | Admitting: *Deleted

## 2017-01-04 ENCOUNTER — Encounter: Payer: Self-pay | Admitting: Pharmacy Technician

## 2017-01-04 ENCOUNTER — Telehealth: Payer: Self-pay | Admitting: *Deleted

## 2017-01-04 NOTE — Progress Notes (Signed)
The patient is approved for drug assistance by [employer] for Neulasta. Coverage is from 01/04/17 - 01/04/17. Enrollment is based on Self-Pay. Drug assistance is proactive in the event that the patient needs the drug. There is no DOS for Neulasta to date.

## 2017-01-04 NOTE — Telephone Encounter (Signed)
Oncology Nurse Navigator Documentation  Oncology Nurse Navigator Flowsheets 01/04/2017  Navigator Location [facility]  Navigator Encounter Type Telephone/I checked on [name]'s lab work and did not see he had labs this week.  He does not have a lab appt for next week nor an appt for his ct scan. I called [name] to see if I can get him appts.  I was unable to reach but left vm message to call me.   Telephone Outgoing Call  Treatment Phase Treatment  Barriers/Navigation Needs Coordination of Care  Interventions Coordination of Care  Coordination of Care Appts  Acuity Level 1  Time Spent with Patient 15

## 2017-01-04 NOTE — Progress Notes (Signed)
Oncology Nurse Navigator Documentation  Oncology Nurse Navigator Flowsheets 01/04/2017  Navigator Location [facility]  Navigator Encounter Type Telephone/I received a vm message from patient's wife. I called and spoke with patient and wife.  I asked about lab work. They are unsure of when to go get labs.  I explained and stated I would follow up with them on Monday about labs. I also stated patient needs Ct scan before appt. I gave the wife the phone number to call central scheduling to make appt before 9/25.  She verbalized understanding.   Telephone Outgoing Call  Treatment Phase Treatment  Barriers/Navigation Needs Education;Coordination of Care  Education Other  Interventions Education;Coordination of Care  Coordination of Care Other  Acuity Level 2  Time Spent with Patient 30

## 2017-01-07 ENCOUNTER — Other Ambulatory Visit (HOSPITAL_COMMUNITY)
Admission: RE | Admit: 2017-01-07 | Discharge: 2017-01-07 | Disposition: A | Discharge status: Home / Self Care | Payer: Medicaid Other | Source: Ambulatory Visit | Attending: Oncology | Admitting: Oncology

## 2017-01-07 ENCOUNTER — Encounter: Payer: Self-pay | Admitting: *Deleted

## 2017-01-07 ENCOUNTER — Telehealth: Payer: Self-pay | Admitting: *Deleted

## 2017-01-07 DIAGNOSIS — C787 Secondary malignant neoplasm of liver and intrahepatic bile duct: Secondary | ICD-10-CM | POA: Diagnosis present

## 2017-01-07 DIAGNOSIS — C3490 Malignant neoplasm of unspecified part of unspecified bronchus or lung: Secondary | ICD-10-CM | POA: Diagnosis not present

## 2017-01-07 DIAGNOSIS — Z5111 Encounter for antineoplastic chemotherapy: Secondary | ICD-10-CM | POA: Insufficient documentation

## 2017-01-07 LAB — COMPREHENSIVE METABOLIC PANEL
ALBUMIN: 3.5 g/dL (ref 3.5–5.0)
ALT: 12 U/L — ABNORMAL LOW (ref 17–63)
AST: 26 U/L (ref 15–41)
Alkaline Phosphatase: 96 U/L (ref 38–126)
Anion gap: 10 (ref 5–15)
BUN: 14 mg/dL (ref 6–20)
CO2: 30 mmol/L (ref 22–32)
Calcium: 9 mg/dL (ref 8.9–10.3)
Chloride: 93 mmol/L — ABNORMAL LOW (ref 101–111)
Creatinine, Ser: 0.97 mg/dL (ref 0.61–1.24)
GFR calc Af Amer: >60 mL/min (ref >60)
Glucose, Bld: 95 mg/dL (ref 65–99)
POTASSIUM: 4.4 mmol/L (ref 3.5–5.1)
Sodium: 133 mmol/L — ABNORMAL LOW (ref 135–145)
TOTAL PROTEIN: 7.5 g/dL (ref 6.5–8.1)
Total Bilirubin: 0.4 mg/dL (ref 0.3–1.2)

## 2017-01-07 LAB — CBC WITH DIFFERENTIAL/PLATELET
BASOS ABS: 0 10*3/uL (ref 0.0–0.1)
BASOS PCT: 0 %
EOS ABS: 0.1 10*3/uL (ref 0.0–0.7)
EOS PCT: 1 %
HCT: 25.4 % — ABNORMAL LOW (ref 39.0–52.0)
Hemoglobin: 8.4 g/dL — ABNORMAL LOW (ref 13.0–17.0)
Lymphocytes Relative: 5 %
Lymphs Abs: 0.5 10*3/uL — ABNORMAL LOW (ref 0.7–4.0)
MCH: 28.2 pg (ref 26.0–34.0)
MCHC: 33.1 g/dL (ref 30.0–36.0)
MCV: 85.2 fL (ref 78.0–100.0)
MONO ABS: 1.2 10*3/uL — AB (ref 0.1–1.0)
MONOS PCT: 12 %
Neutro Abs: 7.7 10*3/uL (ref 1.7–7.7)
Neutrophils Relative %: 82 %
PLATELETS: 142 10*3/uL — AB (ref 150–400)
RBC: 2.98 MIL/uL — ABNORMAL LOW (ref 4.22–5.81)
RDW: 18.1 % — AB (ref 11.5–15.5)
WBC: 9.4 10*3/uL (ref 4.0–10.5)

## 2017-01-07 NOTE — Telephone Encounter (Signed)
Oncology Nurse Navigator Documentation  Oncology Nurse Navigator Flowsheets 01/07/2017  Navigator Location [facility]  Navigator Encounter Type Telephone/I updated Dr. Julien Nordmann that [name] did not show for lab appt. I contacted [facility] and fax order and DX code. I called patient to update that he needs to go every Monday for labs at [facility] while receiving treatment.  I spoke with wife and gave her the number to call to arrange the appt. I told her she could also just walk in to get lab work completed. She verbalized understanding.  I called [facility] to update.   Telephone Outgoing Call  Treatment Phase Treatment  Barriers/Navigation Needs Education;Coordination of Care  Education Other  Interventions Coordination of Care;Education  Coordination of Care Other  Acuity Level 2  Time Spent with Patient 30

## 2017-01-11 ENCOUNTER — Telehealth: Payer: Self-pay

## 2017-01-11 ENCOUNTER — Other Ambulatory Visit: Payer: Self-pay | Admitting: Oncology

## 2017-01-11 DIAGNOSIS — Z5111 Encounter for antineoplastic chemotherapy: Secondary | ICD-10-CM

## 2017-01-11 DIAGNOSIS — C787 Secondary malignant neoplasm of liver and intrahepatic bile duct: Secondary | ICD-10-CM

## 2017-01-11 DIAGNOSIS — C3490 Malignant neoplasm of unspecified part of unspecified bronchus or lung: Secondary | ICD-10-CM

## 2017-01-11 MED ORDER — OXYCODONE-ACETAMINOPHEN 5-325 MG PO TABS: 1.0000 | ORAL_TABLET | ORAL | 0 refills | Status: DC | PRN

## 2017-01-11 NOTE — Telephone Encounter (Signed)
Wife called for oxycodone refill. Done by Mikey Bussing. LVM ready for pickup on home and cell.

## 2017-01-14 ENCOUNTER — Ambulatory Visit (HOSPITAL_COMMUNITY)
Admission: RE | Admit: 2017-01-14 | Discharge: 2017-01-14 | Disposition: A | Discharge status: Home / Self Care | Payer: Medicaid Other | Source: Ambulatory Visit | Attending: Oncology | Admitting: Oncology

## 2017-01-14 DIAGNOSIS — R590 Localized enlarged lymph nodes: Secondary | ICD-10-CM | POA: Diagnosis not present

## 2017-01-14 DIAGNOSIS — J90 Pleural effusion, not elsewhere classified: Secondary | ICD-10-CM | POA: Insufficient documentation

## 2017-01-14 DIAGNOSIS — Z5111 Encounter for antineoplastic chemotherapy: Secondary | ICD-10-CM | POA: Diagnosis present

## 2017-01-14 DIAGNOSIS — C3490 Malignant neoplasm of unspecified part of unspecified bronchus or lung: Secondary | ICD-10-CM | POA: Insufficient documentation

## 2017-01-14 DIAGNOSIS — C787 Secondary malignant neoplasm of liver and intrahepatic bile duct: Secondary | ICD-10-CM | POA: Insufficient documentation

## 2017-01-14 MED ORDER — IOPAMIDOL (ISOVUE-300) INJECTION 61%
100.0000 mL | Freq: Once | INTRAVENOUS | Status: AC | PRN
Start: 2017-01-14 — End: 2017-01-14
  Administered 2017-01-14: 100 mL via INTRAVENOUS

## 2017-01-15 ENCOUNTER — Encounter: Payer: Self-pay | Admitting: Oncology

## 2017-01-15 ENCOUNTER — Other Ambulatory Visit: Payer: Self-pay | Admitting: Oncology

## 2017-01-15 ENCOUNTER — Ambulatory Visit: Payer: Self-pay | Admitting: Internal Medicine

## 2017-01-15 ENCOUNTER — Telehealth: Payer: Self-pay | Admitting: Oncology

## 2017-01-15 ENCOUNTER — Ambulatory Visit (HOSPITAL_BASED_OUTPATIENT_CLINIC_OR_DEPARTMENT_OTHER): Payer: Self-pay | Admitting: Oncology

## 2017-01-15 ENCOUNTER — Telehealth: Payer: Self-pay | Admitting: *Deleted

## 2017-01-15 ENCOUNTER — Other Ambulatory Visit (HOSPITAL_BASED_OUTPATIENT_CLINIC_OR_DEPARTMENT_OTHER): Payer: Self-pay

## 2017-01-15 ENCOUNTER — Ambulatory Visit: Payer: Self-pay

## 2017-01-15 ENCOUNTER — Ambulatory Visit: Payer: Medicaid Other

## 2017-01-15 ENCOUNTER — Other Ambulatory Visit: Payer: Self-pay

## 2017-01-15 VITALS — BP 108/65 | HR 106 | Temp 98.0°F | Resp 20 | Ht 69.0 in | Wt 180.1 lb

## 2017-01-15 DIAGNOSIS — C3490 Malignant neoplasm of unspecified part of unspecified bronchus or lung: Secondary | ICD-10-CM

## 2017-01-15 DIAGNOSIS — C787 Secondary malignant neoplasm of liver and intrahepatic bile duct: Secondary | ICD-10-CM

## 2017-01-15 DIAGNOSIS — C3411 Malignant neoplasm of upper lobe, right bronchus or lung: Secondary | ICD-10-CM

## 2017-01-15 DIAGNOSIS — M545 Low back pain: Secondary | ICD-10-CM

## 2017-01-15 DIAGNOSIS — R0602 Shortness of breath: Secondary | ICD-10-CM

## 2017-01-15 DIAGNOSIS — Z720 Tobacco use: Secondary | ICD-10-CM

## 2017-01-15 DIAGNOSIS — R05 Cough: Secondary | ICD-10-CM

## 2017-01-15 DIAGNOSIS — T451X5A Adverse effect of antineoplastic and immunosuppressive drugs, initial encounter: Secondary | ICD-10-CM

## 2017-01-15 DIAGNOSIS — R634 Abnormal weight loss: Secondary | ICD-10-CM

## 2017-01-15 DIAGNOSIS — Z5111 Encounter for antineoplastic chemotherapy: Secondary | ICD-10-CM

## 2017-01-15 DIAGNOSIS — R079 Chest pain, unspecified: Secondary | ICD-10-CM

## 2017-01-15 DIAGNOSIS — D6481 Anemia due to antineoplastic chemotherapy: Secondary | ICD-10-CM

## 2017-01-15 DIAGNOSIS — C7951 Secondary malignant neoplasm of bone: Secondary | ICD-10-CM

## 2017-01-15 LAB — CBC WITH DIFFERENTIAL/PLATELET
BASO%: 0.8 % (ref 0.0–2.0)
BASOS ABS: 0.1 10*3/uL (ref 0.0–0.1)
EOS ABS: 0.1 10*3/uL (ref 0.0–0.5)
EOS%: 1.5 % (ref 0.0–7.0)
HEMATOCRIT: 30.4 % — AB (ref 38.4–49.9)
HEMOGLOBIN: 10 g/dL — AB (ref 13.0–17.1)
LYMPH#: 0.5 10*3/uL — AB (ref 0.9–3.3)
LYMPH%: 6.3 % — ABNORMAL LOW (ref 14.0–49.0)
MCH: 27.7 pg (ref 27.2–33.4)
MCHC: 32.9 g/dL (ref 32.0–36.0)
MCV: 84.2 fL (ref 79.3–98.0)
MONO#: 1.1 10*3/uL — ABNORMAL HIGH (ref 0.1–0.9)
MONO%: 12.5 % (ref 0.0–14.0)
NEUT#: 6.7 10*3/uL — ABNORMAL HIGH (ref 1.5–6.5)
NEUT%: 78.9 % — ABNORMAL HIGH (ref 39.0–75.0)
Platelets: 264 10*3/uL (ref 140–400)
RBC: 3.6 10*6/uL — ABNORMAL LOW (ref 4.20–5.82)
RDW: 20.9 % — AB (ref 11.0–14.6)
WBC: 8.5 10*3/uL (ref 4.0–10.3)

## 2017-01-15 LAB — COMPREHENSIVE METABOLIC PANEL
ALBUMIN: 3.5 g/dL (ref 3.5–5.0)
ALT: 8 U/L (ref 0–55)
AST: 22 U/L (ref 5–34)
Alkaline Phosphatase: 106 U/L (ref 40–150)
Anion Gap: 10 mEq/L (ref 3–11)
BUN: 11.1 mg/dL (ref 7.0–26.0)
CALCIUM: 9.8 mg/dL (ref 8.4–10.4)
CO2: 28 mEq/L (ref 22–29)
Chloride: 96 mEq/L — ABNORMAL LOW (ref 98–109)
Creatinine: 1 mg/dL (ref 0.7–1.3)
EGFR: 87 mL/min/{1.73_m2} — AB (ref >90)
Glucose: 114 mg/dl (ref 70–140)
POTASSIUM: 4.1 meq/L (ref 3.5–5.1)
Sodium: 134 mEq/L — ABNORMAL LOW (ref 136–145)
Total Bilirubin: 0.47 mg/dL (ref 0.20–1.20)
Total Protein: 8.1 g/dL (ref 6.4–8.3)

## 2017-01-15 MED ORDER — OXYCODONE-ACETAMINOPHEN 5-325 MG PO TABS: 1.0000 | ORAL_TABLET | ORAL | 0 refills | Status: DC | PRN

## 2017-01-15 NOTE — Telephone Encounter (Signed)
No additional appts - cancelled appt per 9/25 los - Cancel all labs, visits, chemo for this patient. He will follow up as needed only.

## 2017-01-15 NOTE — Assessment & Plan Note (Signed)
This is a very pleasant 56 year old white male with history of extensive stage small cell lung cancer diagnosed in September 2016 status post systemic chemotherapy with carboplatin and etoposide completed in December 2016. He also received 2 doses of carboplatin and irinotecan discontinued February 2017. The patient had disease recurrence in March 2018. He was treated with 2 more cycles with carboplatin and irinotecan with no response followed by 1 cycle of treatment with Yervoy and Opdivo.  The patient was then referred to hospice care. He and his family werenot in agreement with the decision and seeking more treatment options. The patient is now on treatment with carboplatin for an AUC of 5 and etoposide 80 mg/m given days 1 through 3. Status post 2 cycles.  The patient was seen with Dr. Julien Nordmann. CT scan results were discussed with the patient, his wife, and daughter. Explained that the scan show progressive disease with an increase in the size of some of the areas in the lungs and liver and new involvement with some the lymph nodes. Discussed with the patient is family that his disease is quite resistant to treatment and recommend that he stop treatment given that he has had several lines of therapy with continued progression of disease. Recommend referral to hospice of [facility]. The patient is in agreement to proceeding with hospice as appreciative of the care that he received here.  He was given a refill of his oxycodone. He knows that he can call our office should he need anything. We will follow-up with him as needed. He will be monitored closely by hospice.

## 2017-01-15 NOTE — Telephone Encounter (Signed)
Referral made to [facility]. Spoke with Cassandra.

## 2017-01-15 NOTE — Progress Notes (Signed)
[facility] Follow up:    [name] Frizzle, MD [address] [place] [address]   DIAGNOSIS: Extensive stage small cell lung cancer diagnosed in September 2016 status post systemic chemotherapy with carboplatin and etoposide completed in December 2016.    SUMMARY OF ONCOLOGIC HISTORY:   Small cell lung cancer ([place])   01/18/2015 Imaging    Chest Xray- 6.8 cm x 7.6 cm RIGHT hilar mass most compatible with bronchogenic carcinoma.       01/18/2015 Imaging    CT chest- 8.7 x 8.7 cm R hilar mass encasing the R pulm artery and occluding the R upper lobe bronchus. Sm R pleural effusion; malignant or reactive. 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 34 mm x 26 mm mass in body of pancreas      01/21/2015 Imaging    MRI brain- No acute intracranial findings. No signs of metastatic disease. Minor white matter disease, stable and nonspecific.      01/26/2015 Pathology Results    Lung, biopsy, right upper lobe - SMALL CELL CARCINOMA.      01/26/2015 Pathology Results    Diagnosis FINE NEEDLE ASPIRATION: ENDOSCOPIC SPECIMEN A, EBUS LEVEL 7 NODE (SPECIMEN 1 OF 3 COLLECTED 01/26/2015) MALIGNANT CELLS PRESENT, CONSISTENT WITH SMALL CELL CARCINOMA.      01/26/2015 Pathology Results    WANG NEEDLE ASPIRATION, SPECIMEN B LEVEL 7 NODE (SPECIMEN 2 OF 3 COLLECTED 01/26/2015) MALIGNANT CELLS PRESENT, CONSISTENT WITH SMALL CELL CARCINOMA.      01/26/2015 Pathology Results    Diagnosis BRONCHIAL BRUSHING SPECIMEN C, RIGHT UPPER LOBE (SPECIMEN 3 OF 3 COLLECTED 01/26/2015) ATYPICAL CELLS PRESENT.      01/27/2015 PET scan    Large hypermetabolic right hilar mass with mediastinal invasion and infrahilar extension, maximum standard uptake value 13.0.  Pancreatic tail mass, approximately 4.5 cm in diameter. Abdominal aortic aneurysm, 5.8 cm in diameter.      02/10/2015 Pathology Results    Diagnosis FINE NEEDLE ASPIRATION, ENDOSCOPIC, PANCREAS TAIL  (SPECIMEN 1 OF 1 COLLECTED 02/10/15): MALIGNANT CELLS CONSISTENT WITH METASTATIC SMALL CELL CARCINOMA.      02/14/2015 - 03/06/2015 Chemotherapy    Cisplatin/Etoposide      03/06/2015 Treatment Plan Change    Change systemic therapy to Carboplatin based      03/07/2015 - 05/10/2015 Chemotherapy    Carboplatin/Etoposide.      04/13/2015 Imaging    resolution of pancreatic metastases, decrease in size of R perihilar mss and RUL mass. Interval response to therapy      05/10/2015 - 05/31/2015 Chemotherapy    Change to carboplatin/irinotecan for cycle #5 and #6 secondary to significant skin toxicity presumably from etoposide      07/12/2015 -  Radiation Therapy    25 Gy to whole brain and 30 Gy to R hilar region by Dr. Lisbeth Renshaw.      09/13/2015 Imaging    CT C/A/P unchanged from 2/28. persistent prominence of peribronchovascular intersitium throughout R lung, in perihilar region, no definitive findings to suggest progression of disease on exam      11/03/2015 Imaging    MRI brain 3 mm area of restricted diffusion R posterior temporal lobe without abnormal enhancement favor acute infarct, mild dural thickening enhancement over R hemisphere now present previously      11/15/2015 Imaging    CT C/A/P Interval development of 1.6 x 1.4 cm L adrenal nodule suspicious for metastatic lesion. No other sites of new metastatic disease  are noted elsewhere in C/A/P. Evolving postradiation changes in R lung      12/01/2015 - 12/14/2015 Radiation Therapy    Radiation therapy to L adrenal nodule      01/18/2016 Imaging    CT C/A/P 1. Stable appearance of the chest. Specifically, there are post XRT findings in the right lung with perihilar and hilar airway thickening, some upper lobe airway plugging, and scattered densities which merit observation but which are likely therapy rib related. 2. Centrilobular emphysema  3. Coronary, aortic arch, and branch vessel atherosclerotic vascular disease 4. On prior  PET-CT from 01/27/2015 there was a pancreatic body mass. Prior biopsy from 02/10/2015 showed this to be consistent with metastatic small cell carcinoma. This mass is not visible on today's CT scan, and has presumably resolved with treatment. There is a transition from a somewhat atrophic pancreatic tail to a normal caliber pancreatic body occurring in the vicinity of the prior mass. 5. Reduced size of a small left adrenal mass, currently 1.2 by 1.7 cm 6. Infrarenal abdominal aortic aneurysm up to 5.8 cm diameter, with patent aorta bi-iliac stent graft in place and no evidence of endoleak 7. Lumbar spondylosis and degenerative disc disease with mild impingement at L3-4 and L4-5      02/02/2016 Imaging    No evidence of metastatic disease to the brain.Resolution of small right hemispheric fluid collection. No leptomeningeal enhancement identified on today's study. This finding may have been due to a small subdural hematoma previously which has resolved.Negative for acute infarct. Small area of restricted diffusion in the right posterior temporal lobe on the prior study likely was an area of acute infarct which has resolved.      04/13/2016 Imaging    CT CAP- 1. Relatively similar appearance of areas of right-sided radiation change. No well-defined residual or recurrent disease identified. 2. No thoracic adenopathy. 3. No evidence of metastatic disease in the abdomen or pelvis.      07/10/2016 Imaging    MRI brain- 1. No evidence of intracranial metastatic disease within limitations of mild motion artifact. 2. Unchanged subcentimeter lesion in the clivus. Small osseous metastasis not excluded.      07/20/2016 Imaging    CT CAP- 9.5 x 4.6 cm central right upper lobe mass extending to the perihilar region, new, compatible with lung cancer recurrence. Associated subpleural nodularity in the right upper lobe, suspicious for metastatic disease.  Associated mild mediastinal lymphadenopathy,  compatible with nodal metastases.  Multifocal hepatic lesions in both lobes, new, compatible with hepatic metastases, with index lesions as above.  Additional ancillary findings as above.      07/20/2016 Relapse/Recurrence    Last Treatment Date: 12/14/15 XRT; 06/10/15 Chemotherapy Recent Lab Values: Recent Lab Values:  N/A      08/23/2016 Imaging    c/o RLE pain. Doppler ultrasound done and referred to vascular surgeon: IMPRESSION: No evidence of DVT with the right lower extremity. 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 Arterial Doppler or CT angiogram is recommended for further evaluation.      09/28/2016 Progression    Restaging CT C/A/P:  Stable slight interval increase in size of large right upper lobe mass extending into the right hilum.  There is increased ground-glass nodularity peripherally predominately within the right upper lobe and involving the right middle lobe which may represent superimposed infectious/inflammatory process. Disease progression in this location is not excluded. Recommend attention on followup.  Interval increase in size of multiple hepatic  metastatic lesions.  Stable to mild interval decrease in size of mediastinal adenopathy.      PRIOR THERAPY:  He was started on systemic chemotherapy initially with one cycle of cisplatin and etoposide which was not tolerated well and his treatment was changed to carboplatin and etoposide for 3 more cycles completed in December 2016.  He also received 2 doses of carboplatin and irinotecan discontinued February 2017. The patient had disease recurrence in March 2018. He was treated with 2 more cycles with carboplatin and irinotecan with no response followed by 1 cycle of treatment with Yervoy and Opdivo.  The patient was then referred to hospice care. He and his family werenot in agreement with the decision and seeking more treatment options.  CURRENT THERAPY:  Carboplatin for an AUC of 5 with etoposide 80 mg meter squared. Carboplatin is given on day 1 and etoposide given on days 1 through 3. Started on 12/03/2016. The patient is status post 2 cycles.  INTERVAL HISTORY: Earl Losee 56 y.o. male returns for routine follow-up with his wife and daughter. The patient continues to tolerate chemotherapy well overall. He has his baseline shortness of breath. He reports a cough that is productive of clear to white sputum. Hemoptysis has resolved. Has intermittent chest pain. He has not had any fevers or chills. Denies abdominal pain, nausea, vomiting. No constipation or diarrhea. Appetite has been fair. He has lost about 3 pounds since his last visit. Uses oxycodone 2-4 tablets daily for chest and back pain.The patient is here o discuss his recent restaging CT scan results.   Patient Active Problem List   Diagnosis Date Noted  . Encounter for antineoplastic chemotherapy 11/24/2016  . Goals of care, counseling/discussion 11/24/2016  . AAA (abdominal aortic aneurysm) without rupture ([place]) 08/11/2015  . Small cell lung cancer ([place]) 02/07/2015  . Mass of pancreas   . Lung mass-right 01/19/2015  . Pancreatic mass 01/19/2015  . COPD (chronic obstructive pulmonary disease) ([place]) 11/27/2010  . Tobacco abuse 11/22/2010  . Mixed hyperlipidemia 11/10/2010  . HYPERTENSION, BENIGN 01/10/2009  . CAD, NATIVE VESSEL 01/10/2009    is allergic to lisinopril and tea.  MEDICAL HISTORY: Past Medical History:  Diagnosis Date  . AAA (abdominal aortic aneurysm) ([place])   . Arthritis   . COPD (chronic obstructive pulmonary disease) ([place])   . Coronary artery disease 2002   Lesion in LAD s/p angioplasty; s/p CABG x 4 Sept 2013  . Encounter for antineoplastic chemotherapy 11/24/2016  . Essential hypertension, benign   . Hyperlipidemia   . Myocardial infarct ([place]) 1999, 2002  . Pancreatitis   . Small cell lung cancer ([place])    Stage IIIB with extension the pancreas     SURGICAL HISTORY: Past Surgical History:  Procedure Laterality Date  . ABDOMINAL AORTIC ENDOVASCULAR STENT GRAFT N/A 08/11/2015   Procedure: ABDOMINAL AORTIC ENDOVASCULAR STENT GRAFT;  Surgeon: Serafina Mitchell, MD;  Location: [facility] OR;  Service: Vascular;  Laterality: N/A;  . [place], 2002  . CORONARY ARTERY BYPASS GRAFT  01/02/2012   Procedure: CORONARY ARTERY BYPASS GRAFTING (CABG);  Surgeon: [name], MD;  Location: [facility];  Service: Open Heart Surgery;  Laterality: N/A;  times three using left internal mammary artery and right endoscopically harvested saphenous vein  . EUS N/A 02/10/2015   Procedure: UPPER ENDOSCOPIC ULTRASOUND (EUS) LINEAR;  Surgeon: [name], MD;  Location: [facility] ENDOSCOPY;  Service: Endoscopy;  Laterality: N/A;  . KNEE SURGERY Left    ACL repair  .  LEFT HEART CATHETERIZATION WITH CORONARY ANGIOGRAM N/A 01/01/2012   Procedure: LEFT HEART CATHETERIZATION WITH CORONARY ANGIOGRAM;  Surgeon: Hillary Bow, MD;  Location: [facility] CATH LAB;  Service: Cardiovascular;  Laterality: N/A;  . LUNG BIOPSY N/A 01/26/2015   Procedure: LUNG BIOPSY;  Surgeon: [name], MD;  Location: [facility];  Service: Thoracic;  Laterality: N/A;  . LYMPH NODE BIOPSY N/A 01/26/2015   Procedure: LYMPH NODE BIOPSY;  Surgeon: [name], MD;  Location: [facility];  Service: Thoracic;  Laterality: N/A;  . MULTIPLE EXTRACTIONS WITH ALVEOLOPLASTY  02/21/2012   Procedure: MULTIPLE EXTRACION WITH ALVEOLOPLASTY;  Surgeon: Lenn Cal, DDS;  Location: [facility] ORS;  Service: Oral Surgery;  Laterality: N/A;  Extaction of tooth #'s 4,5,6,7,10,11,12,13,14,15,19,20,21,22,23,24,25,   . PORTACATH PLACEMENT Left 02/01/2015   Procedure: ATTEMPTED INSERTION OF PORT-A-CATH;  Surgeon: [name], MD;  Location: [facility];  Service: Thoracic;  Laterality: Left;  . TEE WITHOUT CARDIOVERSION  01/02/2012   Procedure: TRANSESOPHAGEAL ECHOCARDIOGRAM (TEE);  Surgeon: [name], MD;   Location: [facility];  Service: Open Heart Surgery;  Laterality: N/A;  . VIDEO BRONCHOSCOPY WITH ENDOBRONCHIAL ULTRASOUND N/A 01/26/2015   Procedure: VIDEO BRONCHOSCOPY WITH ENDOBRONCHIAL ULTRASOUND;  Surgeon: [name], MD;  Location: [facility];  Service: Thoracic;  Laterality: N/A;    SOCIAL HISTORY: Social History   Social History  . Marital status: Married    Spouse name: N/A  . Number of children: 5  . Years of education: N/A   Occupational History  . Works in [employer]  . Smoking status: Current Every Day Smoker    Packs/day: 2.00    Years: 35.00    Types: Cigarettes    Start date: 06/30/1976  . Smokeless tobacco: Never Used  . Alcohol use No     Comment: Maybe a 12 pack/mth if that - very rare since 2013  . Drug use: No  . Sexual activity: Yes   Other Topics Concern  . Not on file   Social History Narrative  . No narrative on file    FAMILY HISTORY: Family History  Problem Relation Age of Onset  . Heart disease Father   . Diabetes Father   . Hyperlipidemia Father   . Hypertension Father   . Hyperlipidemia Mother   . Hypertension Mother   . Stroke Mother   . AAA (abdominal aortic aneurysm) Mother   . Diabetes Paternal Grandmother   . Cancer Neg Hx     Review of Systems  Constitutional: Positive for fatigue. Negative for chills and fever.  HENT:  Negative.   Eyes: Negative.   Respiratory: Positive for cough. Negative for hemoptysis.        He has shortness of breath with exertion, but not at rest. This is baseline for him.  Cardiovascular: Negative for leg swelling and palpitations.       Reports intermittent chest pain which is unchanged from baseline.  Gastrointestinal: Negative.   Genitourinary: Negative.    Musculoskeletal: Positive for back pain. Negative for neck pain and neck stiffness.  Skin: Negative.   Neurological: Negative.   Hematological: Negative.   Psychiatric/Behavioral: Negative.      PHYSICAL  EXAMINATION  ECOG PERFORMANCE STATUS: 1 - Symptomatic but completely ambulatory  Vitals:   01/15/17 0949  BP: 108/65  Pulse: (!) 106  Resp: 20  Temp: 98 F (36.7 C)  SpO2: 97%    Physical Exam  Constitutional: He is oriented to person,  place, and time and well-developed, well-nourished, and in no distress. No distress.  HENT:  Head: Normocephalic.  Mouth/Throat: Oropharynx is clear and moist. No oropharyngeal exudate.  Eyes: Conjunctivae are normal. Right eye exhibits no discharge. Left eye exhibits no discharge. No scleral icterus.  Neck: Normal range of motion. Neck supple. No tracheal deviation present.  Cardiovascular: Normal rate, regular rhythm, normal heart sounds and intact distal pulses.   Pulmonary/Chest: Effort normal. No stridor. No respiratory distress. He has no wheezes. He has no rales.  Diminished breath sounds to the right middle and right lower lobe posteriorly.  Abdominal: Soft. Bowel sounds are normal. He exhibits no distension and no mass. There is no tenderness.  Musculoskeletal: Normal range of motion. He exhibits no edema.  Lymphadenopathy:    He has no cervical adenopathy.  Neurological: He is alert and oriented to person, place, and time. He exhibits normal muscle tone. Gait normal. Coordination normal.  Skin: Skin is warm and dry. No rash noted. He is not diaphoretic. No erythema. No pallor.  Psychiatric: Mood, memory, affect and judgment normal.  Vitals reviewed.   LABORATORY DATA:  CBC    Component Value Date/Time   WBC 8.5 01/15/2017 0931   WBC 9.4 01/07/2017 1223   RBC 3.60 (L) 01/15/2017 0931   RBC 2.98 (L) 01/07/2017 1223   HGB 10.0 (L) 01/15/2017 0931   HCT 30.4 (L) 01/15/2017 0931   PLT 264 01/15/2017 0931   MCV 84.2 01/15/2017 0931   MCH 27.7 01/15/2017 0931   MCH 28.2 01/07/2017 1223   MCHC 32.9 01/15/2017 0931   MCHC 33.1 01/07/2017 1223   RDW 20.9 (H) 01/15/2017 0931   LYMPHSABS 0.5 (L) 01/15/2017 0931   MONOABS 1.1 (H)  01/15/2017 0931   EOSABS 0.1 01/15/2017 0931   BASOSABS 0.1 01/15/2017 0931    CMP     Component Value Date/Time   NA 134 (L) 01/15/2017 0931   K 4.1 01/15/2017 0931   CL 93 (L) 01/07/2017 1223   CO2 28 01/15/2017 0931   GLUCOSE 114 01/15/2017 0931   BUN 11.1 01/15/2017 0931   CREATININE 1.0 01/15/2017 0931   CALCIUM 9.8 01/15/2017 0931   PROT 8.1 01/15/2017 0931   ALBUMIN 3.5 01/15/2017 0931   AST 22 01/15/2017 0931   ALT 8 01/15/2017 0931   ALKPHOS 106 01/15/2017 0931   BILITOT 0.47 01/15/2017 0931   GFRNONAA >60 01/07/2017 1223   GFRNONAA 87 01/27/2015 1450   GFRAA >60 01/07/2017 1223   GFRAA >89 01/27/2015 1450    RADIOGRAPHIC STUDIES:  Ct Chest W Contrast  Result Date: 01/14/2017 CLINICAL DATA:  Followup metastatic small cell lung carcinoma. Undergoing chemotherapy. EXAM: CT CHEST, ABDOMEN, AND PELVIS WITH CONTRAST TECHNIQUE: Multidetector CT imaging of the chest, abdomen and pelvis was performed following the standard protocol during bolus administration of intravenous contrast. CONTRAST:  137mL ISOVUE-300 IOPAMIDOL (ISOVUE-300) INJECTION 61% COMPARISON:  09/28/2016 FINDINGS: CT CHEST FINDINGS Cardiovascular: No acute findings. Aortic and coronary artery atherosclerosis. Mediastinum/Nodes: Mild increase in mediastinal lymphadenopathy is seen in the lateral aortic region, with largest lymph node measuring 1.4 cm on image 20/2 compared to 0.8 cm previously other mild mediastinal lymphadenopathy shows no significant change. Lungs/Pleura: Postobstructive right upper and middle lobe collapse is new since previous study. This limits visualization of the central right upper lobe lung mass, which is difficult to measure. This mass continues to involve the right hilum with encasement of the right pulmonary artery and right mainstem bronchus. Increased soft tissue prominence is  seen within the right infrahilar region, which is suspicious for tumor progression. New moderate right pleural  effusion is seen right lower lobe compressive atelectasis. Left lung remains clear. Mild paraseptal emphysema again noted. Musculoskeletal: No suspicious bone lesions identified. CT ABDOMEN PELVIS FINDINGS Hepatobiliary: Diffuse liver metastases show significant increase compared to previous study. Index lesion near the junction of the right and left hepatic lobes measures 5.0 x 4.8 cm on image 65/ 2 compared to 2.3 x 2.2 cm previously. Another index lesion in the lateral segment of the left lobe measures 3.9 x 4.0 cm on image 53/2 compared to 1.9 x 1.6 cm previously. Pancreas: Unremarkable. No pancreatic ductal dilatation or surrounding inflammatory changes. Spleen: Normal in size without focal abnormality. Adrenals/Urinary Tract: Adrenal glands are unremarkable. Kidneys are normal, without renal calculi, focal lesion, or hydronephrosis. Bladder is unremarkable. Stomach/Bowel: Stomach is within normal limits. Appendix appears normal. No evidence of bowel wall thickening, distention, or inflammatory changes. Vascular/Lymphatic: New lymphadenopathy is seen in the porta hepatis, with largest lymph node measuring 2.4 cm on image 61/2. New 1.7 cm lymph node in the portacaval space. No other pathologically enlarged lymph nodes identified within the abdomen or pelvis. Endovascular stent graft repair of abdominal aortic aneurysm is stable in appearance. Thrombosis of the right iliac limb of the graft is again demonstrated. Reproductive: Unremarkable. Other: None. Musculoskeletal: No suspicious bone lesions identified. IMPRESSION: New right upper and middle lobe postobstructive collapse which limits visualization of large central right upper lobe mass. Increased soft tissue prominence seen in right hilar region, suspicious for tumor progression. New moderate right pleural effusion. Mildly increased mediastinal lymphadenopathy. New mild abdominal lymphadenopathy in the porta hepatis portacaval space. Significant increase in  diffuse hepatic metastatic disease. Electronically Signed   By: Earle Gell M.D.   On: 01/14/2017 14:39   Ct Abdomen Pelvis W Contrast  Result Date: 01/14/2017 CLINICAL DATA:  Followup metastatic small cell lung carcinoma. Undergoing chemotherapy. EXAM: CT CHEST, ABDOMEN, AND PELVIS WITH CONTRAST TECHNIQUE: Multidetector CT imaging of the chest, abdomen and pelvis was performed following the standard protocol during bolus administration of intravenous contrast. CONTRAST:  130mL ISOVUE-300 IOPAMIDOL (ISOVUE-300) INJECTION 61% COMPARISON:  09/28/2016 FINDINGS: CT CHEST FINDINGS Cardiovascular: No acute findings. Aortic and coronary artery atherosclerosis. Mediastinum/Nodes: Mild increase in mediastinal lymphadenopathy is seen in the lateral aortic region, with largest lymph node measuring 1.4 cm on image 20/2 compared to 0.8 cm previously other mild mediastinal lymphadenopathy shows no significant change. Lungs/Pleura: Postobstructive right upper and middle lobe collapse is new since previous study. This limits visualization of the central right upper lobe lung mass, which is difficult to measure. This mass continues to involve the right hilum with encasement of the right pulmonary artery and right mainstem bronchus. Increased soft tissue prominence is seen within the right infrahilar region, which is suspicious for tumor progression. New moderate right pleural effusion is seen right lower lobe compressive atelectasis. Left lung remains clear. Mild paraseptal emphysema again noted. Musculoskeletal: No suspicious bone lesions identified. CT ABDOMEN PELVIS FINDINGS Hepatobiliary: Diffuse liver metastases show significant increase compared to previous study. Index lesion near the junction of the right and left hepatic lobes measures 5.0 x 4.8 cm on image 65/ 2 compared to 2.3 x 2.2 cm previously. Another index lesion in the lateral segment of the left lobe measures 3.9 x 4.0 cm on image 53/2 compared to 1.9 x 1.6 cm  previously. Pancreas: Unremarkable. No pancreatic ductal dilatation or surrounding inflammatory changes. Spleen: Normal in size without  focal abnormality. Adrenals/Urinary Tract: Adrenal glands are unremarkable. Kidneys are normal, without renal calculi, focal lesion, or hydronephrosis. Bladder is unremarkable. Stomach/Bowel: Stomach is within normal limits. Appendix appears normal. No evidence of bowel wall thickening, distention, or inflammatory changes. Vascular/Lymphatic: New lymphadenopathy is seen in the porta hepatis, with largest lymph node measuring 2.4 cm on image 61/2. New 1.7 cm lymph node in the portacaval space. No other pathologically enlarged lymph nodes identified within the abdomen or pelvis. Endovascular stent graft repair of abdominal aortic aneurysm is stable in appearance. Thrombosis of the right iliac limb of the graft is again demonstrated. Reproductive: Unremarkable. Other: None. Musculoskeletal: No suspicious bone lesions identified. IMPRESSION: New right upper and middle lobe postobstructive collapse which limits visualization of large central right upper lobe mass. Increased soft tissue prominence seen in right hilar region, suspicious for tumor progression. New moderate right pleural effusion. Mildly increased mediastinal lymphadenopathy. New mild abdominal lymphadenopathy in the porta hepatis portacaval space. Significant increase in diffuse hepatic metastatic disease. Electronically Signed   By: Earle Gell M.D.   On: 01/14/2017 14:39    ASSESSMENT and THERAPY PLAN:   Small cell lung cancer [facility]) This is a very pleasant 56 year old white male with history of extensive stage small cell lung cancer diagnosed in September 2016 status post systemic chemotherapy with carboplatin and etoposide completed in December 2016. He also received 2 doses of carboplatin and irinotecan discontinued February 2017. The patient had disease recurrence in March 2018. He was treated with 2 more cycles  with carboplatin and irinotecan with no response followed by 1 cycle of treatment with Yervoy and Opdivo.  The patient was then referred to hospice care. He and his family werenot in agreement with the decision and seeking more treatment options. The patient is now on treatment with carboplatin for an AUC of 5 and etoposide 80 mg/m given days 1 through 3. Status post 2 cycles.  The patient was seen with Dr. Julien Nordmann. CT scan results were discussed with the patient, his wife, and daughter. Explained that the scan show progressive disease with an increase in the size of some of the areas in the lungs and liver and new involvement with some the lymph nodes. Discussed with the patient is family that his disease is quite resistant to treatment and recommend that he stop treatment given that he has had several lines of therapy with continued progression of disease. Recommend referral to hospice of [facility]. The patient is in agreement to proceeding with hospice as appreciative of the care that he received here.  He was given a refill of his oxycodone. He knows that he can call our office should he need anything. We will follow-up with him as needed. He will be monitored closely by hospice.    No orders of the defined types were placed in this encounter.   All questions were answered. The patient knows to call the clinic with any problems, questions or concerns. We can certainly see the patient much sooner if necessary.  Mikey Bussing, NP 01/15/2017   ADDENDUM: Hematology/Oncology Attending: I had a face to face encounter with the patient. I recommended his care plan. This is a very pleasant 56 years old white male with extensive stage small cell lung cancer status post several systemic chemotherapy regimens and most recently treated with carboplatin and etoposide again in addition to palliative radiotherapy to the enlarging right lung mass. The patient tolerated his treatment well except  for fatigue. He had repeat CT  scan of the chest, abdomen and pelvis performed recently. I personally and independently reviewed the scan and discuss the results with the patient and his wife and daughter. Unfortunately his scan showed further disease progression and no response to the treatment. I had a lengthy discussion with the patient and his family about his current condition and the nature of the refractory disease he has. I strongly recommended for them to consider palliative care and hospice at this point. I don't think additional systemic chemotherapy or radiation will be beneficial for this patient. We will continue to help the patient and support him during this time. He would be followed by the palliative and hospice care of his [place]. We will be happy to see him on as-needed basis at this point. The patient and his family were in agreement with the current plan and they were very appreciative for the care of the received at the [place] in [place].  Disclaimer: This note was dictated with voice recognition software. Similar sounding words can inadvertently be transcribed and may be missed upon review. Eilleen Kempf, MD 01/16/17

## 2017-01-16 ENCOUNTER — Ambulatory Visit: Payer: Self-pay

## 2017-01-17 ENCOUNTER — Ambulatory Visit: Payer: Self-pay

## 2017-01-21 ENCOUNTER — Telehealth: Payer: Self-pay | Admitting: Family Medicine

## 2017-01-21 NOTE — Telephone Encounter (Signed)
Pt has stopped all cancer treatments and has been set up with [facility].  Need OK for PCP to take over any Hospice needs.  Approval given.

## 2017-01-21 NOTE — Telephone Encounter (Signed)
ok 

## 2017-01-29 ENCOUNTER — Other Ambulatory Visit: Payer: Self-pay | Admitting: Medical Oncology

## 2017-01-29 ENCOUNTER — Telehealth: Payer: Self-pay | Admitting: Internal Medicine

## 2017-01-29 NOTE — Telephone Encounter (Signed)
01/28/17 prescription refill scanned in media

## 2017-02-06 ENCOUNTER — Other Ambulatory Visit: Payer: Self-pay

## 2017-02-06 ENCOUNTER — Ambulatory Visit: Payer: Self-pay | Admitting: Oncology

## 2017-02-06 ENCOUNTER — Ambulatory Visit: Payer: Self-pay

## 2017-02-07 ENCOUNTER — Ambulatory Visit: Payer: Self-pay

## 2017-02-08 ENCOUNTER — Ambulatory Visit: Payer: Self-pay

## 2017-02-15 ENCOUNTER — Telehealth: Payer: Self-pay | Admitting: Family Medicine

## 2017-02-15 DIAGNOSIS — C3490 Malignant neoplasm of unspecified part of unspecified bronchus or lung: Secondary | ICD-10-CM

## 2017-02-15 DIAGNOSIS — C787 Secondary malignant neoplasm of liver and intrahepatic bile duct: Secondary | ICD-10-CM

## 2017-02-15 DIAGNOSIS — Z5111 Encounter for antineoplastic chemotherapy: Secondary | ICD-10-CM

## 2017-02-15 MED ORDER — OXYCODONE-ACETAMINOPHEN 5-325 MG PO TABS: 1.0000 | ORAL_TABLET | ORAL | 0 refills | Status: DC | PRN

## 2017-02-15 NOTE — Telephone Encounter (Signed)
Received fax from pharm requesting refill on oxycodone - per Dr. Dennard Schaumann ok to refill. RX printed, signed and faxed to [facility] patient.

## 2017-02-17 ENCOUNTER — Emergency Department (HOSPITAL_COMMUNITY)
Admission: EM | Admit: 2017-02-17 | Discharge: 2017-02-17 | Disposition: A | Discharge status: Home / Self Care | Payer: No Typology Code available for payment source | Attending: Emergency Medicine | Admitting: Emergency Medicine

## 2017-02-17 ENCOUNTER — Encounter (HOSPITAL_COMMUNITY): Payer: Self-pay | Admitting: Cardiology

## 2017-02-17 ENCOUNTER — Emergency Department (HOSPITAL_COMMUNITY): Payer: No Typology Code available for payment source

## 2017-02-17 DIAGNOSIS — Z85118 Personal history of other malignant neoplasm of bronchus and lung: Secondary | ICD-10-CM | POA: Insufficient documentation

## 2017-02-17 DIAGNOSIS — Y998 Other external cause status: Secondary | ICD-10-CM | POA: Diagnosis not present

## 2017-02-17 DIAGNOSIS — F17210 Nicotine dependence, cigarettes, uncomplicated: Secondary | ICD-10-CM | POA: Diagnosis not present

## 2017-02-17 DIAGNOSIS — I252 Old myocardial infarction: Secondary | ICD-10-CM | POA: Insufficient documentation

## 2017-02-17 DIAGNOSIS — T07XXXA Unspecified multiple injuries, initial encounter: Secondary | ICD-10-CM | POA: Diagnosis not present

## 2017-02-17 DIAGNOSIS — R109 Unspecified abdominal pain: Secondary | ICD-10-CM | POA: Diagnosis not present

## 2017-02-17 DIAGNOSIS — I10 Essential (primary) hypertension: Secondary | ICD-10-CM | POA: Insufficient documentation

## 2017-02-17 DIAGNOSIS — Y939 Activity, unspecified: Secondary | ICD-10-CM | POA: Insufficient documentation

## 2017-02-17 DIAGNOSIS — Y92410 Unspecified street and highway as the place of occurrence of the external cause: Secondary | ICD-10-CM | POA: Insufficient documentation

## 2017-02-17 DIAGNOSIS — J449 Chronic obstructive pulmonary disease, unspecified: Secondary | ICD-10-CM | POA: Insufficient documentation

## 2017-02-17 DIAGNOSIS — Z8679 Personal history of other diseases of the circulatory system: Secondary | ICD-10-CM | POA: Insufficient documentation

## 2017-02-17 DIAGNOSIS — I2510 Atherosclerotic heart disease of native coronary artery without angina pectoris: Secondary | ICD-10-CM | POA: Diagnosis not present

## 2017-02-17 DIAGNOSIS — R51 Headache: Secondary | ICD-10-CM | POA: Insufficient documentation

## 2017-02-17 LAB — CBC WITH DIFFERENTIAL/PLATELET
Basophils Absolute: 0 10*3/uL (ref 0.0–0.1)
Basophils Relative: 0 %
Eosinophils Absolute: 0.1 10*3/uL (ref 0.0–0.7)
Eosinophils Relative: 3 %
HCT: 34.5 % — ABNORMAL LOW (ref 39.0–52.0)
Hemoglobin: 11 g/dL — ABNORMAL LOW (ref 13.0–17.0)
Lymphocytes Relative: 10 %
Lymphs Abs: 0.5 10*3/uL — ABNORMAL LOW (ref 0.7–4.0)
MCH: 28.9 pg (ref 26.0–34.0)
MCHC: 31.9 g/dL (ref 30.0–36.0)
MCV: 90.8 fL (ref 78.0–100.0)
Monocytes Absolute: 0.7 10*3/uL (ref 0.1–1.0)
Monocytes Relative: 13 %
Neutro Abs: 3.7 10*3/uL (ref 1.7–7.7)
Neutrophils Relative %: 74 %
Platelets: 131 10*3/uL — ABNORMAL LOW (ref 150–400)
RBC: 3.8 MIL/uL — ABNORMAL LOW (ref 4.22–5.81)
RDW: 18.6 % — ABNORMAL HIGH (ref 11.5–15.5)
WBC: 5 10*3/uL (ref 4.0–10.5)

## 2017-02-17 LAB — BASIC METABOLIC PANEL
Anion gap: 11 (ref 5–15)
BUN: 13 mg/dL (ref 6–20)
CO2: 27 mmol/L (ref 22–32)
Calcium: 9.3 mg/dL (ref 8.9–10.3)
Chloride: 99 mmol/L — ABNORMAL LOW (ref 101–111)
Creatinine, Ser: 0.86 mg/dL (ref 0.61–1.24)
GFR calc Af Amer: >60 mL/min (ref >60)
GFR calc non Af Amer: >60 mL/min (ref >60)
Glucose, Bld: 92 mg/dL (ref 65–99)
Potassium: 4.1 mmol/L (ref 3.5–5.1)
Sodium: 137 mmol/L (ref 135–145)

## 2017-02-17 MED ORDER — MORPHINE SULFATE (PF) 4 MG/ML IV SOLN
4.0000 mg | Freq: Once | INTRAVENOUS | Status: AC
  Administered 2017-02-17: 4 mg via INTRAVENOUS
  Filled 2017-02-17: qty 1

## 2017-02-17 MED ORDER — IOPAMIDOL (ISOVUE-300) INJECTION 61%
100.0000 mL | Freq: Once | INTRAVENOUS | Status: AC | PRN
  Administered 2017-02-17: 100 mL via INTRAVENOUS

## 2017-02-17 MED ORDER — HYDROMORPHONE HCL 1 MG/ML IJ SOLN
1.0000 mg | Freq: Once | INTRAMUSCULAR | Status: AC
  Administered 2017-02-17: 1 mg via INTRAVENOUS
  Filled 2017-02-17: qty 1

## 2017-02-17 MED ORDER — SODIUM CHLORIDE 0.9 % IV SOLN
INTRAVENOUS | Status: DC
  Administered 2017-02-17: 17:00:00 via INTRAVENOUS

## 2017-02-17 NOTE — ED Notes (Signed)
Patient transported to CT 
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
## 2017-02-22 ENCOUNTER — Telehealth: Payer: Self-pay | Admitting: Family Medicine

## 2017-02-22 DIAGNOSIS — Z5111 Encounter for antineoplastic chemotherapy: Secondary | ICD-10-CM

## 2017-02-22 DIAGNOSIS — C3490 Malignant neoplasm of unspecified part of unspecified bronchus or lung: Secondary | ICD-10-CM

## 2017-02-22 DIAGNOSIS — C787 Secondary malignant neoplasm of liver and intrahepatic bile duct: Secondary | ICD-10-CM

## 2017-02-22 MED ORDER — OXYCODONE-ACETAMINOPHEN 5-325 MG PO TABS: 1.0000 | ORAL_TABLET | ORAL | 0 refills | Status: DC | PRN

## 2017-02-22 NOTE — Telephone Encounter (Signed)
Requesting refill on oxycodone - per Dr. Dennard Schaumann ok to refill and to increase qty to 90 as that is all hospice will cover. Rx faxed to [employer]
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
## 2017-03-12 ENCOUNTER — Other Ambulatory Visit: Payer: Self-pay | Admitting: Family Medicine

## 2017-03-12 DIAGNOSIS — C3490 Malignant neoplasm of unspecified part of unspecified bronchus or lung: Secondary | ICD-10-CM

## 2017-03-12 DIAGNOSIS — K8689 Other specified diseases of pancreas: Secondary | ICD-10-CM

## 2017-04-08 ENCOUNTER — Telehealth: Payer: Self-pay | Admitting: Family Medicine

## 2017-04-08 DIAGNOSIS — Z5111 Encounter for antineoplastic chemotherapy: Secondary | ICD-10-CM

## 2017-04-08 DIAGNOSIS — C787 Secondary malignant neoplasm of liver and intrahepatic bile duct: Secondary | ICD-10-CM

## 2017-04-08 DIAGNOSIS — C3490 Malignant neoplasm of unspecified part of unspecified bronchus or lung: Secondary | ICD-10-CM

## 2017-04-08 MED ORDER — OXYCODONE-ACETAMINOPHEN 5-325 MG PO TABS: 1.0000 | ORAL_TABLET | ORAL | 0 refills | Status: AC | PRN

## 2017-04-23 NOTE — Telephone Encounter (Signed)
Patient requesting a refill on Oxycodone - Ok to refill??

## 2017-04-23 NOTE — Telephone Encounter (Signed)
ok 

## 2017-04-23 NOTE — Telephone Encounter (Signed)
Rx printed and faxed to [facility]

## 2017-04-23 DEATH — deceased
